# Patient Record
Sex: Female | Born: 1940
Health system: Southern US, Community
[De-identification: ages and names within clinical notes are randomized; demographics above are authoritative.]

## PROBLEM LIST (undated history)

## (undated) ENCOUNTER — Inpatient Hospital Stay (HOSPITAL_COMMUNITY): Payer: Medicare Other

## (undated) DIAGNOSIS — Z8673 Personal history of transient ischemic attack (TIA), and cerebral infarction without residual deficits: Secondary | ICD-10-CM

## (undated) DIAGNOSIS — I4891 Unspecified atrial fibrillation: Secondary | ICD-10-CM

## (undated) DIAGNOSIS — I10 Essential (primary) hypertension: Secondary | ICD-10-CM

## (undated) DIAGNOSIS — R7302 Impaired glucose tolerance (oral): Secondary | ICD-10-CM

## (undated) DIAGNOSIS — I639 Cerebral infarction, unspecified: Secondary | ICD-10-CM

## (undated) DIAGNOSIS — Z9289 Personal history of other medical treatment: Secondary | ICD-10-CM

## (undated) DIAGNOSIS — I2510 Atherosclerotic heart disease of native coronary artery without angina pectoris: Secondary | ICD-10-CM

## (undated) DIAGNOSIS — E119 Type 2 diabetes mellitus without complications: Secondary | ICD-10-CM

## (undated) DIAGNOSIS — E785 Hyperlipidemia, unspecified: Secondary | ICD-10-CM

## (undated) DIAGNOSIS — Z720 Tobacco use: Secondary | ICD-10-CM

## (undated) HISTORY — DX: Personal history of other medical treatment: Z92.89

## (undated) HISTORY — DX: Hyperlipidemia, unspecified: E78.5

## (undated) HISTORY — DX: Impaired glucose tolerance (oral): R73.02

## (undated) HISTORY — DX: Tobacco use: Z72.0

## (undated) HISTORY — DX: Unspecified atrial fibrillation: I48.91

## (undated) HISTORY — DX: Personal history of transient ischemic attack (TIA), and cerebral infarction without residual deficits: Z86.73

---

## 1999-01-13 ENCOUNTER — Encounter: Payer: Self-pay | Admitting: Emergency Medicine

## 1999-01-13 ENCOUNTER — Emergency Department (HOSPITAL_COMMUNITY): Admission: EM | Admit: 1999-01-13 | Discharge: 1999-01-13 | Payer: Self-pay | Admitting: Emergency Medicine

## 2010-08-21 DIAGNOSIS — I639 Cerebral infarction, unspecified: Secondary | ICD-10-CM

## 2010-08-21 HISTORY — DX: Cerebral infarction, unspecified: I63.9

## 2010-12-08 ENCOUNTER — Encounter (HOSPITAL_COMMUNITY): Payer: Self-pay | Admitting: Radiology

## 2010-12-08 ENCOUNTER — Inpatient Hospital Stay (HOSPITAL_COMMUNITY)
Admission: EM | Admit: 2010-12-08 | Discharge: 2010-12-14 | DRG: 065 | Disposition: A | Discharge status: Home Health | Payer: Medicare Other | Attending: Neurology | Admitting: Neurology

## 2010-12-08 ENCOUNTER — Emergency Department (HOSPITAL_COMMUNITY): Payer: Medicare Other

## 2010-12-08 DIAGNOSIS — Z7901 Long term (current) use of anticoagulants: Secondary | ICD-10-CM

## 2010-12-08 DIAGNOSIS — F17200 Nicotine dependence, unspecified, uncomplicated: Secondary | ICD-10-CM | POA: Diagnosis present

## 2010-12-08 DIAGNOSIS — I10 Essential (primary) hypertension: Secondary | ICD-10-CM | POA: Diagnosis present

## 2010-12-08 DIAGNOSIS — E785 Hyperlipidemia, unspecified: Secondary | ICD-10-CM | POA: Diagnosis present

## 2010-12-08 DIAGNOSIS — I4892 Unspecified atrial flutter: Secondary | ICD-10-CM | POA: Diagnosis present

## 2010-12-08 DIAGNOSIS — I4891 Unspecified atrial fibrillation: Secondary | ICD-10-CM | POA: Diagnosis present

## 2010-12-08 DIAGNOSIS — I6350 Cerebral infarction due to unspecified occlusion or stenosis of unspecified cerebral artery: Principal | ICD-10-CM | POA: Diagnosis present

## 2010-12-08 HISTORY — DX: Essential (primary) hypertension: I10

## 2010-12-08 LAB — CBC
HCT: 42.4 % (ref 36.0–46.0)
Hemoglobin: 14.9 g/dL (ref 12.0–15.0)
MCH: 30.2 pg (ref 26.0–34.0)
MCHC: 35.1 g/dL (ref 30.0–36.0)
MCV: 86 fL (ref 78.0–100.0)
Platelets: 262 10*3/uL (ref 150–400)
RBC: 4.93 MIL/uL (ref 3.87–5.11)
RDW: 12.5 % (ref 11.5–15.5)
WBC: 10 10*3/uL (ref 4.0–10.5)

## 2010-12-08 LAB — BASIC METABOLIC PANEL
Chloride: 106 mEq/L (ref 96–112)
GFR calc Af Amer: >60 mL/min (ref >60)
Potassium: 3.6 mEq/L (ref 3.5–5.1)
Sodium: 138 mEq/L (ref 135–145)

## 2010-12-08 LAB — DIFFERENTIAL
Basophils Absolute: 0 10*3/uL (ref 0.0–0.1)
Basophils Relative: 0 % (ref 0–1)
Eosinophils Absolute: 0.5 10*3/uL (ref 0.0–0.7)
Eosinophils Relative: 5 % (ref 0–5)
Lymphocytes Relative: 34 % (ref 12–46)
Lymphs Abs: 3.4 10*3/uL (ref 0.7–4.0)
Monocytes Absolute: 0.5 10*3/uL (ref 0.1–1.0)
Monocytes Relative: 5 % (ref 3–12)
Neutro Abs: 5.7 10*3/uL (ref 1.7–7.7)
Neutrophils Relative %: 57 % (ref 43–77)

## 2010-12-09 ENCOUNTER — Inpatient Hospital Stay (HOSPITAL_COMMUNITY): Payer: Medicare Other

## 2010-12-09 DIAGNOSIS — I359 Nonrheumatic aortic valve disorder, unspecified: Secondary | ICD-10-CM

## 2010-12-09 LAB — COMPREHENSIVE METABOLIC PANEL
CO2: 25 mEq/L (ref 19–32)
Calcium: 8.8 mg/dL (ref 8.4–10.5)
Creatinine, Ser: 0.58 mg/dL (ref 0.4–1.2)
GFR calc non Af Amer: >60 mL/min (ref >60)
Glucose, Bld: 105 mg/dL — ABNORMAL HIGH (ref 70–99)
Total Bilirubin: 0.8 mg/dL (ref 0.3–1.2)

## 2010-12-09 LAB — LIPID PANEL
Cholesterol: 263 mg/dL — ABNORMAL HIGH (ref 0–200)
HDL: 35 mg/dL — ABNORMAL LOW (ref >39)
Triglycerides: 166 mg/dL — ABNORMAL HIGH (ref <150)

## 2010-12-09 LAB — CARDIAC PANEL(CRET KIN+CKTOT+MB+TROPI)
Relative Index: INVALID (ref 0.0–2.5)
Troponin I: 0.01 ng/mL (ref 0.00–0.06)

## 2010-12-09 LAB — APTT: aPTT: 32 seconds (ref 24–37)

## 2010-12-09 LAB — PROTIME-INR
INR: 0.91 (ref 0.00–1.49)
Prothrombin Time: 12.5 seconds (ref 11.6–15.2)

## 2010-12-09 LAB — HEMOGLOBIN A1C: Mean Plasma Glucose: 131 mg/dL — ABNORMAL HIGH (ref <117)

## 2010-12-09 MED ORDER — GADOBENATE DIMEGLUMINE 529 MG/ML IV SOLN
10.0000 mL | Freq: Once | INTRAVENOUS | Status: AC
  Administered 2010-12-09: 10 mL via INTRAVENOUS

## 2010-12-11 LAB — BASIC METABOLIC PANEL
BUN: 11 mg/dL (ref 6–23)
Calcium: 8.9 mg/dL (ref 8.4–10.5)
GFR calc non Af Amer: >60 mL/min (ref >60)
Potassium: 3.4 mEq/L — ABNORMAL LOW (ref 3.5–5.1)

## 2010-12-11 LAB — CK TOTAL AND CKMB (NOT AT ARMC): CK, MB: 0.8 ng/mL (ref 0.3–4.0)

## 2010-12-11 LAB — CARDIAC PANEL(CRET KIN+CKTOT+MB+TROPI)
CK, MB: 0.6 ng/mL (ref 0.3–4.0)
Relative Index: INVALID (ref 0.0–2.5)
Total CK: 59 U/L (ref 7–177)

## 2010-12-11 LAB — MAGNESIUM: Magnesium: 2.2 mg/dL (ref 1.5–2.5)

## 2010-12-12 DIAGNOSIS — I6330 Cerebral infarction due to thrombosis of unspecified cerebral artery: Secondary | ICD-10-CM

## 2010-12-12 DIAGNOSIS — I4891 Unspecified atrial fibrillation: Secondary | ICD-10-CM

## 2010-12-12 LAB — TROPONIN I: Troponin I: 0.01 ng/mL (ref 0.00–0.06)

## 2010-12-12 LAB — TSH: TSH: 1.417 u[IU]/mL (ref 0.350–4.500)

## 2010-12-12 LAB — CK TOTAL AND CKMB (NOT AT ARMC)
CK, MB: 0.8 ng/mL (ref 0.3–4.0)
Total CK: 43 U/L (ref 7–177)

## 2010-12-13 LAB — PROTIME-INR
INR: 0.94 (ref 0.00–1.49)
Prothrombin Time: 12.8 seconds (ref 11.6–15.2)

## 2010-12-16 ENCOUNTER — Ambulatory Visit (INDEPENDENT_AMBULATORY_CARE_PROVIDER_SITE_OTHER): Payer: Self-pay | Admitting: Internal Medicine

## 2010-12-16 ENCOUNTER — Encounter: Payer: Medicare Other | Admitting: *Deleted

## 2010-12-16 DIAGNOSIS — R0989 Other specified symptoms and signs involving the circulatory and respiratory systems: Secondary | ICD-10-CM

## 2010-12-16 DIAGNOSIS — I4891 Unspecified atrial fibrillation: Secondary | ICD-10-CM | POA: Insufficient documentation

## 2010-12-16 DIAGNOSIS — I6350 Cerebral infarction due to unspecified occlusion or stenosis of unspecified cerebral artery: Secondary | ICD-10-CM

## 2010-12-16 DIAGNOSIS — I639 Cerebral infarction, unspecified: Secondary | ICD-10-CM

## 2010-12-16 LAB — POCT INR: INR: 3.3

## 2010-12-19 ENCOUNTER — Ambulatory Visit: Payer: Self-pay | Admitting: Cardiology

## 2010-12-19 LAB — POCT INR: INR: 2.5

## 2010-12-20 NOTE — Consult Note (Signed)
Linda [name], Linda [name] NO.:  [id number]  MEDICAL RECORD NO.:  [id number]           PATIENT TYPE:  LOCATION:                                 FACILITY:  PHYSICIAN:  Bevelyn Buckles. Bensimhon, MDDATE OF BIRTH:  [birth year]  DATE OF CONSULTATION: DATE OF DISCHARGE:                                CONSULTATION   The patient is new to Cardiology.  PRIMARY CARE PHYSICIAN:  Not established  REASON FOR CONSULTATION:  Atrial fibrillation/flutter with RVR.  HISTORY OF PRESENT ILLNESS:  Linda [name] is a 70 year old [place] female, non-English speaking, with no known cardiac history but medical history significant for hypertension, hyperlipidemia, ongoing tobacco abuse (3-6 cigarettes per day, 25 pack years), who presents with new- onset atrial fibrillation/flutter in the setting of admission for CVA.  The patient was in her usual state of health until she noted right-sided weakness on December 08, 2010, and presented to [facility] for further evaluation.  The patient had MRI of the head that showed acute nonhemorrhagic left paracentral pontine infarct and was admitted by the neurology team.  The patient subsequently found to go into atrial fibrillation/flutter with RVR and was rate controlled on diltiazem.  She spontaneously converted and has been maintaining normal sinus rhythm ever since.  The patient actually has had some sinus bradycardia with heart rates documented down in the 30s, although on telemetry I only see down into the 50s.  There is no evidence of significant pauses, but she has been unable to receive her p.o. Cardizem except for the very first dose when her IV diltiazem was discontinued.  She has been mildly hypertensive with peak systolic blood pressure at 159; however, initial blood pressures when she arrived were markedly elevated with peak systolics in 190s, peak diastolic 100.  The patient has had a 2-D echocardiogram that shows a normal LVEF with mild  LVH, grade 1 diastolic function, and mild AI.  Chest x-ray shows only mild cardiomegaly.  EKG with some anterolateral T-wave inversions during her AFib with RPR but resolution of these changes with her conversion.  Labs unremarkable. Currently, the patient has no complaints.  The patient denies chest pain, shortness of breath, orthopnea, PND, lower extremity edema, history of bleeding or dark stools, any difficulty with medical noncompliance, cough, fevers, chills, any other recent changes other than her right-sided weakness that lead to her initial evaluation.  PAST MEDICAL HISTORY: 1. Hypertension. 2. Hyperlipidemia. 3. Ongoing tobacco abuse (3-6 cigarettes per day, 25 pack-year     history). 4. History of CVA (new diagnosis on December 08, 2010).  SOCIAL HISTORY:  The patient lives in [place] with her daughters.  She is not working.  She has the above-noted tobacco abuse history.  No significant EtOH or illicit drug use.  Regular diet.  No regular exercise but active and independent with all daily activities.  FAMILY HISTORY:  Negative for premature diagnosis of coronary artery disease in any first-degree relatives.  REVIEW OF SYSTEMS:  Please see HPI.  All other systems reviewed and were negative.  CODE STATUS:  Full.  ALLERGIES:  NKDA.  MEDICATIONS (  ALL NEW/NO HOME MEDS): 1. Norvasc 5 mg p.o. daily. 2. ECASA 325 mg p.o. daily. 3. Diltiazem 30 mg p.o. q.6 h. (only received one dose). 4. Lovenox 40 mg subcu injection q.24 h. 5. Simvastatin 20 mg p.o. at bedtime. 6. P.r.n. medications.  PHYSICAL EXAMINATION:  VITAL SIGNS:  Temperature 98.9 degrees Fahrenheit with BP 119-159 over 60-84, pulse 63-83 with respiration rate equal to 12, O2 saturation 100% on room air. GENERAL:  The patient is alert and oriented x3 in no apparent distress, able to speak easily and move easily without respiratory distress. HEENT:  Her head is normocephalic, atraumatic.  Pupils equal,  round, reactive to light.  Extraocular muscles are intact.  Nares are patent without discharge. NECK:  Supple without lymphadenopathy.  No thyromegaly.  No JVD. HEART:  Rate is regular, bradycardic in the 50s with audible S1 and S2. A 2/6 systolic murmur in the left upper sternal border.  Pulses 2+ and equal in upper extremities, bilaterally absent in lower extremities, but cap refill less than 2 seconds in lower extremities bilaterally. LUNGS:  Clear to auscultation bilaterally. SKIN:  No rashes, lesions, or petechiae. ABDOMEN:  Soft, nontender, nondistended.  Normal abdominal bowel sounds. No rebound or guarding.  No hepatosplenomegaly. EXTREMITIES:  Without clubbing, cyanosis, or edema. MUSCULOSKELETAL:  Without joint deformity or effusions.  No spinal or CVA tenderness. NEUROLOGIC:  Cranial nerves II through XII grossly intact.  Strength 5/5 in all extremities and axillary groups.  Normal sensation throughout and normal cerebellar function.  RADIOLOGY: 1. Chest x-ray showed mild cardiomegaly. 2. MRI of the head showed acute nonhemorrhagic left paracentral     pontine infarction.  EKG:  Atrial flutter/course of fib at a rate of 110 bpm with anterolateral T-wave inversion, normal axis, no significant Q-waves, LVH.  QRS 84, QTc 487.  Followup tracing showed sinus bradycardia at 57 bpm with no significant ST changes, QTc 483, normal axis, no less pronounced LVH.  LABORATORY DATA:  CBC within normal limits.  BMET, sodium 138, potassium 3.4, chloride 106, bicarb 23, BUN 11, creatinine 0.55, glucose 133. Liver function tests within normal limits.  Hemoglobin A1c 6.2%. Cardiac enzymes negative x4.  ASSESSMENT AND PLAN:  The patient initially seen by Jarrett Ables, PA- C, and then seen and thoroughly assessed by attending cardiologist, Dr. Arvilla Meres.  Linda [name] is a 70 year old [place] female who has no cardiac history but history significant for hypertension,  hyperlipidemia, ongoing tobacco abuse, and now presents with new diagnosis of atrial fibrillation, spontaneously converted on rate control only in the setting of admission for an acute nonhemorrhagic left paracentral pontine infarction.  PAF - now in normal sinus rhythm/sinus bradycardia.  We will need anticoagulation.  Given her lack of insurance, we would favor Coumadin over Pradaxa.  Family feels that she can do Coumadin at the [facility] office.  Heart rate is too low for beta blockade therapy.  We would recommend 2-week event monitor off rate control to assess frequency/severity of atrial fibrillation.     Jarrett Ables, PAC   ______________________________ Bevelyn Buckles. Bensimhon, MD    MS/MEDQ  D:  12/12/2010  T:  12/13/2010  Job:  [id number]  Electronically Signed by Jarrett Ables PAC on 12/16/2010 02:36:58 PM Electronically Signed by Arvilla Meres MD on 12/20/2010 07:19:01 PM

## 2010-12-21 NOTE — Consult Note (Signed)
Linda [name], Linda [name] NO.:  [id number]  MEDICAL RECORD NO.:  [id number]           PATIENT TYPE:  I  LOCATION:  3017                         FACILITY:  [facility]  PHYSICIAN:  Brendia Sacks, MD    DATE OF BIRTH:  1941/05/16  DATE OF CONSULTATION:  12/11/2010 DATE OF DISCHARGE:                                CONSULTATION   REQUESTING PHYSICIAN:  Levie Heritage, MD  REASON FOR CONSULTATION:  Atrial fibrillation.  HISTORY OF PRESENT ILLNESS:  This is a 70 year old woman, who was admitted on December 08, 2010, with right-sided weakness by the Neurology Service and was found to have had acute stroke.  She has been followed on the Stroke Service and there are plans for a TEE tomorrow, December 12, 2010, and plans for discharge to rehab soon.  The patient's rhythm has been sinus bradycardia up until today when she developed sudden atrial fibrillation and flutter with rapid ventricular response.  Consultation was requested for treatment and recommendations.  The patient reports she has a history of palpitations and apparently has a 24-hour monitor in the past, which she was told was normal.  She has no known previous diagnosis of cardiac arrhythmias or heart problems in general.  Interview was conducted with the aid of the telephone translator.  REVIEW OF SYSTEMS:  The patient denies any chest pain or shortness of breath.  She does complain of her stroke symptoms.  She has no other complaints on review of systems.  It is not clear whether she might have reported chest pain to her nurse earlier.  Certainly she has none now.  PAST MEDICAL HISTORY: 1. Hypertension. 2. Hyperlipidemia. 3. New diagnosis of stroke this admission.  PAST SURGICAL HISTORY:  She describes trouble with delivery 40 years ago, at which point she had have surgery and could not have children afterwards presumably emergent hysterectomy.  SOCIAL HISTORY:  She smokes 3-4 cigarettes per day and is  widowed.  FAMILY HISTORY:  Mother died of old age and father died of unknown illness.  Brother had alcoholism.  INPATIENT MEDICATIONS: 1. Xanax 0.25 mg x1. 2. Norvasc 5 mg p.o. daily. 3. Aspirin 325 mg p.o. daily. 4. She did receive 1 p.o. dose of Cardizem. 5. Lovenox 40 mg subcu daily. 6. Zocor 20 mg p.o. at bedtime. 7. Normal saline at 75 mL per hour.  PHYSICAL EXAMINATION:  VITAL SIGNS:  This morning, temperature 98.8, pulse 56, respirations 18, blood pressure of 161/73, and sat 97% on room air.  During my examination, her heart rate ranges from the 90s to the 140s.  On telemetry, she appears to have atrial flutter with variable block. GENERAL:  The patient is calm and comfortable lying in bed and appears to be in no acute distress. HEENT:  Head appears to be normal.  Eyes, sclerae clear.  Pupils are equal, round, reactive to light with irises and conjunctivae appear unremarkable.  ENT, she phonates well although she does not speak [place].  Her lips and tongue appear unremarkable. NECK:  Supple.  No lymphadenopathy or masses.  No thyromegaly.CHEST:  Clear to auscultation bilaterally.  No wheezes, rales, or rhonchi.  There is normal respiratory effort. CARDIOVASCULAR:  Regular rhythm, tachycardic.  No murmur, rub, or gallop.  No lower extremity edema. ABDOMEN:  Soft, nontender, nondistended.  No masses are appreciated. SKIN:  Appears to be grossly unremarkable. MUSCULOSKELETAL:  Strength in the left upper and left lower extremity are 5/5.  Strength in the right upper and right lower extremity is 3+/5. She has an obvious right facial droop.  ANCILLARY STUDIES:  EKG independently reviewed showed atrial fibrillation and atrial flutter with variable block with rapid ventricular response.  Lateral T-wave inversion and considered ischemia in the EKG at 12:49 today.  Repeat EKG at 14:37 showed atrial fibrillation with rapid ventricular response and atrial flutter with variable  block.  An EKG at 14:45, atrial fibrillation with rapid ventricular response, T-wave inversion laterally has resolved.  Previous EKG shows sinus bradycardia.  LABORATORY STUDIES:  One set of cardiac enzymes today negative.  No other laboratory studies.  Previous laboratory studies from December 09, 2010, showed unremarkable complete metabolic panel and CBC on admission was normal.  IMAGING:  Review of imaging is notable for an acute nonhemorrhagic left paracentral pontine infarct.  IMPRESSION AND RECOMMENDATIONS: 1. Atrial fibrillation and flutter with rapid ventricular response     with a normal ejection fraction.  Her 2-D echocardiogram done this     admission showed an ejection fraction of 60-65% with grade 1     diastolic dysfunction.  She has not responded to oral Cardizem     here.  Her rate is quite high at times.  We will transfer her to     3700 and place her on IV Cardizem infusion.  She will continue on     prophylactic Lovenox dosing.  We will check a TSH.  Consider     Coumadin.  I have discussed the case with Dr. Hoy Morn.  He     recommends reevaluation by the Stroke Service in the morning and     consideration of Coumadin and this patient cannot be fully     anticoagulated at this point. 2. Hypertension.  This appears to be stable. 3. Left pontine stroke.  Defer to the stroke team.  This plan was discussed with Dr. Hoy Morn.     Brendia Sacks, MD    DG/MEDQ  D:  12/11/2010  T:  12/11/2010  Job:  [id number]  Electronically Signed by Brendia Sacks  on 12/21/2010 09:58:33 PM

## 2010-12-24 NOTE — Discharge Summary (Signed)
Linda [name], Linda [name]                   ACCOUNT NO.:  [id number]  MEDICAL RECORD NO.:  [id number]           PATIENT TYPE:  I  LOCATION:  3701                         FACILITY:  [facility]  PHYSICIAN:  Giuseppina Quinones P. Pearlean Brownie, MD    DATE OF BIRTH:  03-24-1941  DATE OF ADMISSION:  12/08/2010 DATE OF DISCHARGE:  12/14/2010                              DISCHARGE SUMMARY   DIAGNOSES AT THE TIME OF DISCHARGE: 1. Right pontine infarct secondary to small-vessel disease. 2. Paroxysmal atrial fibrillation converted to normal sinus rhythm     during hospitalization, new diagnosis. 3. Hypertension. 4. Hyperlipidemia. 5. Cigarette smoker (3-6 cigarettes per day with a 25-pack year     history).  MEDICINES AT THE TIME OF DISCHARGE: 1. Amlodipine 5 mg a day. 2. Lipitor 10 mg a day. 3. Warfarin 5 mg tablets, start 7.5 mg daily until directed by [facility] to adjust dose.  STUDIES PERFORMED: 1. CT of the brain on admission shows age indeterminate, but possibly     acute or subacute nonhemorrhagic stroke involving the right     occipital lobe.  Mild bilateral carotid siphon atherosclerosis. 2. Chest x-ray shows mild cardiomegaly.  No acute cardiopulmonary     disease. 3. MRI of the brain shows acute nonhemorrhagic left paracentral     pontine infarct.  Remote infarcts and small vessel disease-type     changes. 4. MRA of the head shows prominent intracranial atherosclerotic-type     changes. 5. MRA of the neck shows motion-degraded exam with atherosclerotic-     type changes. 6. A 2-D echocardiogram shows EF of 60-65% with no obvious source of     embolus. 7. Carotid Doppler shows mild homogeneous plaque noted in distal     common carotid artery and origin and proximal ICA.  No significant     ICA stenosis.  Vertebral artery flow is antegrade. 8. Initial EKG shows sinus rhythm.  EKG on December 11, 2010, at 12:49     shows atrial fibrillation with rapid ventricular response.  LABORATORY  STUDIES:  INR 1.19, PT 15.3.  TSH 1.417.  Troponin 0.01, CK 43, CK-MB 0.8.  Chemistry with potassium 3.4, glucose 133, otherwise normal.  Hemoglobin A1c 6.2.  Cholesterol 263, triglycerides 166, HDL 35, and LDL 195.  HISTORY OF PRESENT ILLNESS:  Linda [name] is a 70 year old [place] female with a history of hypertension that was untreated and dyslipidemia that was untreated.  The patient presents to [facility] Emergency Room after she awakened with a right-sided hemiparesis.  The patient was last seen normal on the evening of December 07, 2010.  The patient had noted 2-day history of intermittent visual complaints and headaches.  The patient awakened the morning of admission at 5:00 a.m. with right-sided weakness and numbness and some slurred speech. Throughout the day, the speech has improved.  The patient has been noted on CT to have a right occipital infarct that is subacute.  No acute changes were seen in the left hemisphere.  Neurology was asked to see for further evaluation.  Blood pressures were in  the 180s over 70s range.  The patient was on no medicines prior to coming in.  She was not a t-PA candidate secondary to delay in time of arrival.  HOSPITAL COURSE:  MRI revealed an acute left pontine infarct, which was small and felt to be secondary to small-vessel disease.  The patient had hypertension and hyperlipidemia that were treated respectively with Norvasc and Lipitor.  She was initially started on aspirin as her antiplatelet.  In the hospital, the patient converted to atrial fibrillation with rapid ventricular response.  [facility] were consulted initially who suggested Coumadin.  Cardiology was also consulted who agreed with Coumadin as well as initial recommendation to change Norvasc to lisinopril.  Cardiology then decided to change diltiazem back to Norvasc to maintain blood pressure control.  Coumadin was started.  Aspirin was discontinued and plans are for  outpatient event monitor by Cardiology.  In hospital, the patient was evaluated by PT, OT, and Speech Therapy to evaluate for possible rehab needs.  At the time of discharge, it is felt the patient is too high-level for inpatient rehab.  Family is agreeable to provide 24/7 supervision.  I have put in place Home Health, PT, OT, and nurse in order to support them along with rolling walker.  CONDITION AT THE TIME OF DISCHARGE:  The patient is alert and oriented x3.  Speech normal, follows commands.  Language normal.  She has no drift in her upper extremities.  She has mild right face and hand weakness.  DISCHARGE PLAN: 1. Discharge home with family with 24/7 supervision. 2. Home Health, PT, OT, and nurse. 3. Coumadin for secondary stroke prevention. 4. Follow up with Dr. Gala [name] in 4 weeks. 5. Outpatient cardiac event monitor as recommended by Dr. Gala [name].     [facility] contacted. 6. Follow up with Linda [name] in his stroke followup clinic in 1-2     months.     Linda [name], N.P.   ______________________________ Linda [name]. Pearlean Brownie, MD    SB/MEDQ  D:  12/14/2010  T:  12/15/2010  Job:  [id number]  cc:   Linda Erion P. Pearlean Brownie, MD Linda Farr, MD Linda Buckles. Bensimhon, MD [facility]  Electronically Signed by Linda [name] N.P. on 12/22/2010 08:54:54 AM Electronically Signed by Delia Heady MD on 12/24/2010 01:49:47 PM

## 2010-12-26 ENCOUNTER — Ambulatory Visit (INDEPENDENT_AMBULATORY_CARE_PROVIDER_SITE_OTHER): Payer: Self-pay | Admitting: Internal Medicine

## 2010-12-26 DIAGNOSIS — R0989 Other specified symptoms and signs involving the circulatory and respiratory systems: Secondary | ICD-10-CM

## 2011-01-02 ENCOUNTER — Encounter: Payer: Medicare Other | Admitting: *Deleted

## 2011-01-03 NOTE — H&P (Signed)
NAMEMAKENLEIGH, CROWNOVER NO.:  [id number]  MEDICAL RECORD NO.:  [id number]           PATIENT TYPE:  I  LOCATION:  3017                         FACILITY:  [facility]  PHYSICIAN:  Marlan Palau, M.D.  DATE OF BIRTH:  [birth year]  DATE OF ADMISSION:  12/08/2010 DATE OF DISCHARGE:                             HISTORY & PHYSICAL   HISTORY OF PRESENT ILLNESS:  Linda [name] is a 70 year old [place] female born April 28, 1941 with a history of hypertension that was untreated and dyslipidemia that was untreated.  This patient comes to the emergency room this morning after she awakened with a right-sided deficit.  The patient was last seen normal on the evening of December 07, 2010.  The patient had noted 2-day history of intermittent visual complaints and headache.  The patient awakened this morning around 5 a.m. with right-sided weakness and numbness and some slurred speech. Throughout the day, the speech has improved.  The patient has been noted on CT scan of the brain to have a right occipital infarct that is subacute.  No acute changes were seen in the left hemisphere.  Neurology was asked to see this patient for further evaluation.  Blood pressures are in the 180/70 range.  The patient was on no medications prior to coming in.  Past medical history is significant for; 1. New onset of what is probably her bihemispheric strokes. 2. Hypertension, untreated. 3. Dyslipidemia that has been untreated.  The patient has no surgical history.  Was on no medications prior to coming in.  The patient has no known allergies.  Smokes 3-4 cigarettes daily.  Does not drink alcohol.  SOCIAL HISTORY:  The patient is widowed, lives with her daughter in the [place] area.  Does not work.  Has only one child.  FAMILY MEDICAL HISTORY:  That mother died of "old age."  Father died of unknown etiology.  The patient has one brother who died with complications of alcoholism.   No sisters.  REVIEW OF SYSTEMS:  It is notable for headache for several days.  The patient has occasional chest pains, increased heart rate, no shortness of breath, nausea, vomiting, troubles controlling the bowels or bladder. No blackout episodes.  PHYSICAL EXAMINATION:  VITAL SIGNS:  Blood pressure is 180/71, heart rate 60, respiratory rate 18, temperature afebrile. GENERAL:  This patient is a fairly well-developed [place] female who is alert and cooperative at time of the examination.  HEENT:  Head is atraumatic.  Eyes; pupils are equal, round, and reactive to light. NECK:  Supple.  No carotid bruits noted. RESPIRATORY:  Clear. CARDIOVASCULAR:  Reveals a regular rate and rhythm.  No obvious murmurs or rubs noted. EXTREMITIES:  Without significant edema. ABDOMEN:  Positive bowel sounds.  No organomegaly or tenderness noted. NEUROLOGIC:  Cranial nerves as above.  Facial symmetry is present.  The patient has good sensation of the face to pinprick, soft touch bilaterally.  She has good strength of the facial muscles.  The patient has full extraocular movements.  Visual fields appear to be full.  Motor testing reveals low slight weakness  of the right arm and right leg greater than the left.  The patient has drift with the right arm, right leg; not present on the left.  The patient has good finger-nose-finger and heel-to-shin bilaterally.  No ataxia noted.  Gait was not tested. Deep tendon reflexes symmetric.  Normal toes, were neutral bilaterally. The patient notes decreased pinprick sensation on the right arm and right leg greater than left.  Vibratory sensation is depressed on the right arm and right leg greater than left.  The patient has no evidence of extinction with double simultaneous stimulation.  No definite aphasia was noted.  Dysarthria has normalized.  Laboratory values notable for a sodium of 138, potassium of 3.6.  The patient has a chloride of 106, CO2 of 23, glucose  of 114, BUN of 10, creatinine of 0.58, calcium 9.0, white count of 10.0, hemoglobin of 14.9, hematocrit 42.4, MCV of 86.0, platelets of 286.  CT of the head is as above.  IMPRESSION: 1. Probable bihemispheric strokes, likely cardiogenic. 2. Hypertension, untreated. 3. Dyslipidemia, untreated.  The patient will be admitted to [facility] for further stroke evaluation.  The patient will go on aspirin therapy, undergo an MRI of the brain and MRA of the head and neck.  The patient will undergo a 2-D echocardiogram and be seen by physical and occupational therapy.  We will follow patient's clinical course while in-house.  The patient will go on medications for blood pressure at this time.  The patient does not have a primary care physician, but has gone to [facility] in the past.     C. Lesia Sago, M.D.     CKW/MEDQ  D:  12/08/2010  T:  12/09/2010  Job:  [id number]  cc:   [facility]  Electronically Signed by Thana Farr M.D. on 01/03/2011 08:16:51 AM

## 2011-01-17 ENCOUNTER — Ambulatory Visit (INDEPENDENT_AMBULATORY_CARE_PROVIDER_SITE_OTHER): Payer: Medicare Other | Admitting: *Deleted

## 2011-01-17 DIAGNOSIS — I4891 Unspecified atrial fibrillation: Secondary | ICD-10-CM

## 2011-01-17 LAB — POCT INR: INR: 2.8

## 2011-02-13 ENCOUNTER — Ambulatory Visit (INDEPENDENT_AMBULATORY_CARE_PROVIDER_SITE_OTHER): Payer: Medicare Other | Admitting: *Deleted

## 2011-02-13 DIAGNOSIS — I4891 Unspecified atrial fibrillation: Secondary | ICD-10-CM

## 2011-02-13 DIAGNOSIS — I639 Cerebral infarction, unspecified: Secondary | ICD-10-CM

## 2011-02-13 DIAGNOSIS — I6350 Cerebral infarction due to unspecified occlusion or stenosis of unspecified cerebral artery: Secondary | ICD-10-CM

## 2011-03-13 ENCOUNTER — Ambulatory Visit (INDEPENDENT_AMBULATORY_CARE_PROVIDER_SITE_OTHER): Payer: Medicare Other | Admitting: *Deleted

## 2011-03-13 DIAGNOSIS — I639 Cerebral infarction, unspecified: Secondary | ICD-10-CM

## 2011-03-13 DIAGNOSIS — I4891 Unspecified atrial fibrillation: Secondary | ICD-10-CM

## 2011-03-13 DIAGNOSIS — I6350 Cerebral infarction due to unspecified occlusion or stenosis of unspecified cerebral artery: Secondary | ICD-10-CM

## 2011-03-13 MED ORDER — AMLODIPINE BESYLATE 5 MG PO TABS: 5.0000 mg | ORAL_TABLET | Freq: Every day | ORAL | Status: DC

## 2011-03-13 MED ORDER — ATORVASTATIN CALCIUM 10 MG PO TABS: 10.0000 mg | ORAL_TABLET | Freq: Every day | ORAL | Status: DC

## 2011-03-24 ENCOUNTER — Encounter: Payer: Self-pay | Admitting: Physician Assistant

## 2011-03-27 ENCOUNTER — Other Ambulatory Visit: Payer: Self-pay | Admitting: Pharmacist

## 2011-03-27 ENCOUNTER — Ambulatory Visit (INDEPENDENT_AMBULATORY_CARE_PROVIDER_SITE_OTHER): Payer: Medicare Other | Admitting: Physician Assistant

## 2011-03-27 ENCOUNTER — Encounter: Payer: Self-pay | Admitting: Physician Assistant

## 2011-03-27 VITALS — BP 157/83 | HR 67 | Resp 16 | Wt 122.0 lb

## 2011-03-27 DIAGNOSIS — M79609 Pain in unspecified limb: Secondary | ICD-10-CM

## 2011-03-27 DIAGNOSIS — E785 Hyperlipidemia, unspecified: Secondary | ICD-10-CM | POA: Insufficient documentation

## 2011-03-27 DIAGNOSIS — I4891 Unspecified atrial fibrillation: Secondary | ICD-10-CM

## 2011-03-27 DIAGNOSIS — R1084 Generalized abdominal pain: Secondary | ICD-10-CM | POA: Insufficient documentation

## 2011-03-27 DIAGNOSIS — I10 Essential (primary) hypertension: Secondary | ICD-10-CM | POA: Insufficient documentation

## 2011-03-27 DIAGNOSIS — R079 Chest pain, unspecified: Secondary | ICD-10-CM | POA: Insufficient documentation

## 2011-03-27 DIAGNOSIS — R21 Rash and other nonspecific skin eruption: Secondary | ICD-10-CM | POA: Insufficient documentation

## 2011-03-27 DIAGNOSIS — M79606 Pain in leg, unspecified: Secondary | ICD-10-CM | POA: Insufficient documentation

## 2011-03-27 MED ORDER — AMLODIPINE BESYLATE 10 MG PO TABS: 10.0000 mg | ORAL_TABLET | Freq: Every day | ORAL | Status: DC

## 2011-03-27 MED ORDER — WARFARIN SODIUM 5 MG PO TABS: 5.0000 mg | ORAL_TABLET | Freq: Every day | ORAL | Status: DC

## 2011-03-27 NOTE — Assessment & Plan Note (Signed)
She is maintaining normal sinus rhythm.  She has been following up with the Coumadin clinic in our office.  She continues to have occasional tachypalpitations.  I have recommended proceeding with the original plan of obtaining an event monitor to assess for frequency of atrial fibrillation.  She had difficulty taking A-V nodal blocking agents in the hospital due to bradycardia.  I will also set her up for a stress test to rule out the possibility of ischemic heart disease.  If this is negative and she is having significant amounts of atrial fibrillation, we could consider antiarrhythmic therapy in the form of a class IC agent.  I will bring her back in followup with Dr. Gala Romney in the next one to 2 months.

## 2011-03-27 NOTE — Assessment & Plan Note (Signed)
She is not really describing any abdominal pain.  She denies any symptoms consistent with acid reflux.  Her abdominal exam is fairly normal.  As noted, she will be referred to primary care.  She can follow further with her new PCP.

## 2011-03-27 NOTE — Patient Instructions (Addendum)
For your hands: Apply Hydrocortisone 1% cream twice daily for one week. Apply Eucerin or Lubriderm cream to your hands at bedtime every night. We will refer you to a primary care doctor.  If this does not get better on your hands, follow up with your primary care doctor for further evaluation.  Lab today--CK/Liver profile  427.31  786.50   Increase Norvasc (amlodipine) to 10mg  daily.  Do not take Lipitor for 2 weeks. If your leg pain does not get any better after not taking Lipitor for 2 weeks start taking Lipitor again. If your leg pain gets better after not taking Lipitor for 2 weeks call our office and let us know.  Schedule an appointment for an event monitor.  Schedule an appointment for a stress myoview test. See instruction sheet given to you today.  Schedule an appointment with a primary care doctor.  Schedule an appointment with Dr Gala Romney in 4-6 weeks.

## 2011-03-27 NOTE — Assessment & Plan Note (Signed)
Obtain LFTs today.  Hold her Lipitor as noted.

## 2011-03-27 NOTE — Assessment & Plan Note (Signed)
Uncontrolled.  Increase Norvasc from 5-10 mg a day.

## 2011-03-27 NOTE — Assessment & Plan Note (Signed)
Question if she is describing myalgias from her Lipitor.  I will have her hold her Lipitor for 2 weeks to see if this helps.  I will also obtain a CPK level today.  If her leg pain does not improve, she can continue Lipitor.  If it does improve, I would consider switching to a medication like Crestor or pravastatin.

## 2011-03-27 NOTE — Assessment & Plan Note (Signed)
Atypical.  However, as noted above, proceed with a stress test to rule out the possibility of ischemic heart disease in case antiarrhythmic therapy is needed.

## 2011-03-27 NOTE — Progress Notes (Signed)
History of Present Illness: Primary Cardiologist:  Dr. Arvilla Meres  Linda [name] is a 70 y.o. [place] female with a h/o HTN, hyperlipidemia and tobacco abuse who was diagnosed with paroxysmal atrial fibrillation/flutter during a hospitalization in 4/12 for a left pontine stroke.  She had no significant ICA stenosis by carotid dopplers.  Echo demonstrated EF 60-65%, mild LVH, grade 1 diast dysfxn, mild AI.  She was seen in the hospital by Dr. Gala Romney and the plan was to get an outpatient event monitor and follow up here.  But, this is her first visit with [name] since her hospitalization.  She has been followed in the coumadin clinic in this office.    She does not speak [place].  She is here today with her granddaughter and an interpreter who both help with the history.  She has a few different complaints today.  She continues to note tachypalpitations several times a week.  She also states her stomach "feels hard."  She denies pain.  No nausea, vomiting, diarrhea, dysphagia or odynophagia.  No unintentional weight loss.  No fevers or chills.  No melena or hematochezia.  No association with meals.  She also notes leg pain.  It is fairly constant and worse in the right leg than the left.  She points to her knees.  She thinks it started when she started the Lipitor.  No edema.  She notes occasion chest pain.  She describes it as feeling like something "biting."  She has noted this for years.  Does not feel like it is changing.  If she drinks something it helps.  She denies orthopnea, PND, syncope.  She also notes a rash on her hands.  No itching.  No other rash on her body.  She thinks she needs a refill on her coumadin.  Past Medical History  Diagnosis Date  . Hypertension   . Hyperlipidemia   . Tobacco abuse     Ongoing (3-6 cigarettes per day, 25 pack year history)  . History of CVA (cerebrovascular accident)     New diagnosis December 08, 2010  . Atrial fibrillation     dx in setting of stroke  4/12;  echo with EF 60-65%, mild LVH, mild AI, grade 1 diast dysfxn    Current Outpatient Prescriptions  Medication Sig Dispense Refill  . Warfarin Sodium (COUMADIN PO) Take by mouth as directed.        Marland Kitchen DISCONTD: amLODipine (NORVASC) 5 MG tablet Take 1 tablet (5 mg total) by mouth daily.  30 tablet  1  . amLODipine (NORVASC) 10 MG tablet Take 1 tablet (10 mg total) by mouth daily.  30 tablet  6    Allergies: No Known Allergies  Social history:  She continues to smoke cigarettes  ROS:  Please see the history of present illness.  All other systems reviewed and negative.   Vital Signs: BP 157/83  Pulse 67  Resp 16  Wt 122 lb (55.339 kg)  PHYSICAL EXAM: Well nourished, well developed, in no acute distress HEENT: normal Neck: no JVD Vascular: No carotid bruits; DP/PT 2+ bilaterally Endocrine: No thyromegaly Cardiac:  normal S1, S2; RRR; no murmur Lungs:  clear to auscultation bilaterally, no wheezing, rhonchi or rales Abd: soft, nontender, no hepatomegaly, normal bowel sounds Ext: no edema Musculoskeletal: No obvious deformities Skin: warm and dry; fingertips of bilateral hands with diffuse scaling and drying with associated pitting, nails without pits Neuro:  CNs 2-12 intact, no focal abnormalities noted  EKG:  Sinus  rhythm, heart rate 65, normal axis, no ischemic changes  ASSESSMENT AND PLAN:

## 2011-03-27 NOTE — Assessment & Plan Note (Signed)
I suspect she has dyshidrotic eczema on her hands.  I have recommended over-the-counter hydrocortisone cream for a week and daily moisturizers.  I also recommend she get a primary care physician.  We will refer her today.  If her symptoms do not improve on her hands, she will need to followup with her new PCP.

## 2011-03-28 LAB — HEPATIC FUNCTION PANEL
ALT: 26 U/L (ref 0–35)
Albumin: 4 g/dL (ref 3.5–5.2)
Total Protein: 7.4 g/dL (ref 6.0–8.3)

## 2011-04-10 ENCOUNTER — Encounter (INDEPENDENT_AMBULATORY_CARE_PROVIDER_SITE_OTHER): Payer: Medicare Other

## 2011-04-10 ENCOUNTER — Ambulatory Visit (INDEPENDENT_AMBULATORY_CARE_PROVIDER_SITE_OTHER): Payer: Medicare Other | Admitting: *Deleted

## 2011-04-10 ENCOUNTER — Ambulatory Visit (HOSPITAL_COMMUNITY): Payer: Medicare Other | Attending: Internal Medicine | Admitting: Radiology

## 2011-04-10 DIAGNOSIS — I4891 Unspecified atrial fibrillation: Secondary | ICD-10-CM

## 2011-04-10 DIAGNOSIS — R079 Chest pain, unspecified: Secondary | ICD-10-CM

## 2011-04-10 DIAGNOSIS — I639 Cerebral infarction, unspecified: Secondary | ICD-10-CM

## 2011-04-10 DIAGNOSIS — I4949 Other premature depolarization: Secondary | ICD-10-CM

## 2011-04-10 DIAGNOSIS — I491 Atrial premature depolarization: Secondary | ICD-10-CM

## 2011-04-10 DIAGNOSIS — I6350 Cerebral infarction due to unspecified occlusion or stenosis of unspecified cerebral artery: Secondary | ICD-10-CM

## 2011-04-10 LAB — PROTIME-INR: Prothrombin Time: 74.6 s (ref 10.2–12.4)

## 2011-04-10 LAB — POCT INR: INR: 6.1

## 2011-04-10 MED ORDER — TECHNETIUM TC 99M TETROFOSMIN IV KIT
33.0000 | PACK | Freq: Once | INTRAVENOUS | Status: AC | PRN
  Administered 2011-04-10: 33 via INTRAVENOUS

## 2011-04-10 MED ORDER — REGADENOSON 0.4 MG/5ML IV SOLN
0.4000 mg | Freq: Once | INTRAVENOUS | Status: AC
  Administered 2011-04-10: 0.4 mg via INTRAVENOUS

## 2011-04-10 MED ORDER — TECHNETIUM TC 99M TETROFOSMIN IV KIT
10.6000 | PACK | Freq: Once | INTRAVENOUS | Status: AC | PRN
  Administered 2011-04-10: 11 via INTRAVENOUS

## 2011-04-10 NOTE — Progress Notes (Signed)
[facility] SITE 3 NUCLEAR MED [address] [place] [address] [phone]  Cardiology Nuclear Med Study  Linda [name] is a 70 y.o. female [id number] [birth year]   Nuclear Med Background Indication for Stress Test:  Evaluation for Ischemia History:  No previous documented CAD, ECHO 60-65%, AFIB/RVR Cardiac Risk Factors: CVA, Hypertension, Lipids and Smoker  Symptoms:  Chest Pain and Palpitations   Nuclear Pre-Procedure Caffeine/Decaff Intake:  None NPO After: 7:00pm   Lungs:  clear IV 0.9% NS with Angio Cath:  22g  IV Site: R Hand  IV Started by:  Stanton Kidney, EMT-P  Chest Size (in):  34 Cup Size: B  Height: 5\' 1"  (1.549 m)  Weight:  121 lb (54.885 kg)  BMI:  Body mass index is 22.86 kg/(m^2). Tech Comments:  NA    Nuclear Med Study 1 or 2 day study: 1 day  Stress Test Type:  Treadmill/Lexiscan  Reading MD: Kristeen Miss, MD  Order Authorizing Provider:  D.Bensimhon/S.Weaver  Resting Radionuclide: Technetium 1m Tetrofosmin  Resting Radionuclide Dose: 10.6 mCi   Stress Radionuclide:  Technetium 89m Tetrofosmin  Stress Radionuclide Dose: 33.0 mCi           Stress Protocol Rest HR: 54 Stress HR: 120  Rest BP: 136/65 Stress BP: 187/84  Exercise Time (min): 5:29 METS: 6.0   Predicted Max HR: 151 bpm % Max HR: 79.47 bpm Rate Pressure Product: 21308   Dose of Adenosine (mg):  n/a Dose of Lexiscan: 0.4 mg  Dose of Atropine (mg): n/a Dose of Dobutamine: n/a mcg/kg/min (at max HR)  Stress Test Technologist: Milana Na, EMT-P  Nuclear Technologist:  Doyne Keel, CNMT     Rest Procedure:  Myocardial perfusion imaging was performed at rest 45 minutes following the intravenous administration of Technetium 15m Tetrofosmin. Rest ECG: NSR  Stress Procedure:  The patient received IV Lexiscan 0.4 mg over 15-seconds with concurrent low level exercise and then Technetium 69m Tetrofosmin was injected at 30-seconds while the patient continued walking one  more minute.  There were non significant changes, very fatigued, and rare pacs/pvcs with Lexiscan.  Quantitative spect images were obtained after a 45-minute delay. Stress ECG: No significant change from baseline ECG  QPS Raw Data Images:  Normal; no motion artifact; normal heart/lung ratio. Stress Images:  Normal homogeneous uptake in all areas of the myocardium. Rest Images:  Normal homogeneous uptake in all areas of the myocardium. Subtraction (SDS):  Normal Transient Ischemic Dilatation (Normal <1.22):  1.01 Lung/Heart Ratio (Normal <0.45):  0.27  Quantitative Gated Spect Images QGS EDV:  64 ml QGS ESV:  20 ml QGS cine images:  NL LV Function; NL Wall Motion QGS EF: 69%  Impression Exercise Capacity:  Poor exercise capacity.  Pt received Lexiscan at the end of exercise. BP Response:  Normal blood pressure response. Clinical Symptoms:  Fatigue ECG Impression:  No significant ST segment change suggestive of ischemia.  Inadequate HR response. Comparison with Prior Nuclear Study: No images to compare  Overall Impression:  Normal stress nuclear study.  No evidence of ischemia.  Normal LV function.    Vesta Mixer, Montez Hageman., MD, [facility]

## 2011-04-13 ENCOUNTER — Telehealth: Payer: Self-pay | Admitting: *Deleted

## 2011-04-13 NOTE — Telephone Encounter (Signed)
granddaughter is aware of stress test results are normal. pt does not speak english per granddaughter. Danielle Rankin

## 2011-04-17 ENCOUNTER — Ambulatory Visit (INDEPENDENT_AMBULATORY_CARE_PROVIDER_SITE_OTHER): Payer: Medicare Other | Admitting: *Deleted

## 2011-04-17 DIAGNOSIS — I6350 Cerebral infarction due to unspecified occlusion or stenosis of unspecified cerebral artery: Secondary | ICD-10-CM

## 2011-04-17 DIAGNOSIS — I4891 Unspecified atrial fibrillation: Secondary | ICD-10-CM

## 2011-04-17 DIAGNOSIS — I639 Cerebral infarction, unspecified: Secondary | ICD-10-CM

## 2011-04-17 LAB — POCT INR: INR: 1.1

## 2011-04-26 ENCOUNTER — Ambulatory Visit: Payer: Medicare Other | Admitting: Internal Medicine

## 2011-05-01 ENCOUNTER — Telehealth: Payer: Self-pay | Admitting: Internal Medicine

## 2011-05-01 ENCOUNTER — Ambulatory Visit (INDEPENDENT_AMBULATORY_CARE_PROVIDER_SITE_OTHER): Payer: Medicare Other | Admitting: *Deleted

## 2011-05-01 DIAGNOSIS — I4891 Unspecified atrial fibrillation: Secondary | ICD-10-CM

## 2011-05-01 DIAGNOSIS — I6350 Cerebral infarction due to unspecified occlusion or stenosis of unspecified cerebral artery: Secondary | ICD-10-CM

## 2011-05-01 DIAGNOSIS — I639 Cerebral infarction, unspecified: Secondary | ICD-10-CM

## 2011-05-01 LAB — POCT INR: INR: 2.1

## 2011-05-01 MED ORDER — WARFARIN SODIUM 5 MG PO TABS: ORAL_TABLET | ORAL | Status: DC

## 2011-05-01 NOTE — Telephone Encounter (Signed)
Clarified Amlodipine dose with the pharmacy.

## 2011-05-01 NOTE — Telephone Encounter (Signed)
Grandaughter told the coumadin clininc nurses that pt stopped her Lipitor in August and is doing better.  No myalgias.  She wants to know if she should be on another chol. Medication.

## 2011-05-01 NOTE — Telephone Encounter (Signed)
Clarification on medication amlodipine direction/ dosage

## 2011-05-02 MED ORDER — PRAVASTATIN SODIUM 20 MG PO TABS: 20.0000 mg | ORAL_TABLET | Freq: Every evening | ORAL | Status: DC

## 2011-05-02 NOTE — Telephone Encounter (Signed)
s/w grandaughter who is aware of lab results and to start pravastatin 20. Linda [name]

## 2011-05-02 NOTE — Telephone Encounter (Signed)
Try pravastatin 20 mg q.h.s. Repeat lipids and LFTs in 6-8 weeks Tereso Newcomer, PA-C

## 2011-05-05 ENCOUNTER — Encounter: Payer: Self-pay | Admitting: Internal Medicine

## 2011-05-05 DIAGNOSIS — Z0000 Encounter for general adult medical examination without abnormal findings: Secondary | ICD-10-CM | POA: Insufficient documentation

## 2011-05-08 ENCOUNTER — Encounter: Payer: Self-pay | Admitting: Internal Medicine

## 2011-05-08 ENCOUNTER — Other Ambulatory Visit (INDEPENDENT_AMBULATORY_CARE_PROVIDER_SITE_OTHER): Payer: Medicare Other

## 2011-05-08 ENCOUNTER — Encounter: Payer: Medicare Other | Admitting: *Deleted

## 2011-05-08 ENCOUNTER — Ambulatory Visit (INDEPENDENT_AMBULATORY_CARE_PROVIDER_SITE_OTHER): Payer: Medicare Other | Admitting: Internal Medicine

## 2011-05-08 VITALS — BP 138/70 | HR 64 | Temp 98.2°F | Ht 60.0 in | Wt 123.0 lb

## 2011-05-08 DIAGNOSIS — R7309 Other abnormal glucose: Secondary | ICD-10-CM

## 2011-05-08 DIAGNOSIS — Z23 Encounter for immunization: Secondary | ICD-10-CM

## 2011-05-08 DIAGNOSIS — I639 Cerebral infarction, unspecified: Secondary | ICD-10-CM

## 2011-05-08 DIAGNOSIS — R7302 Impaired glucose tolerance (oral): Secondary | ICD-10-CM

## 2011-05-08 DIAGNOSIS — F17200 Nicotine dependence, unspecified, uncomplicated: Secondary | ICD-10-CM | POA: Insufficient documentation

## 2011-05-08 DIAGNOSIS — E785 Hyperlipidemia, unspecified: Secondary | ICD-10-CM

## 2011-05-08 DIAGNOSIS — Z79899 Other long term (current) drug therapy: Secondary | ICD-10-CM

## 2011-05-08 DIAGNOSIS — I10 Essential (primary) hypertension: Secondary | ICD-10-CM

## 2011-05-08 DIAGNOSIS — R079 Chest pain, unspecified: Secondary | ICD-10-CM

## 2011-05-08 DIAGNOSIS — I4891 Unspecified atrial fibrillation: Secondary | ICD-10-CM

## 2011-05-08 DIAGNOSIS — I6350 Cerebral infarction due to unspecified occlusion or stenosis of unspecified cerebral artery: Secondary | ICD-10-CM

## 2011-05-08 DIAGNOSIS — I5189 Other ill-defined heart diseases: Secondary | ICD-10-CM | POA: Insufficient documentation

## 2011-05-08 HISTORY — DX: Impaired glucose tolerance (oral): R73.02

## 2011-05-08 LAB — HEPATIC FUNCTION PANEL
Albumin: 4.4 g/dL (ref 3.5–5.2)
Total Bilirubin: 0.5 mg/dL (ref 0.3–1.2)

## 2011-05-08 LAB — BASIC METABOLIC PANEL
BUN: 11 mg/dL (ref 6–23)
CO2: 27 mEq/L (ref 19–32)
Calcium: 9.2 mg/dL (ref 8.4–10.5)
Chloride: 106 mEq/L (ref 96–112)
Creatinine, Ser: 0.6 mg/dL (ref 0.4–1.2)

## 2011-05-08 LAB — LIPID PANEL
HDL: 44.3 mg/dL (ref >39.00)
Triglycerides: 198 mg/dL — ABNORMAL HIGH (ref 0.0–149.0)
VLDL: 39.6 mg/dL (ref 0.0–40.0)

## 2011-05-08 LAB — HEMOGLOBIN A1C: Hgb A1c MFr Bld: 6.4 % (ref 4.6–6.5)

## 2011-05-08 MED ORDER — ATORVASTATIN CALCIUM 10 MG PO TABS: 10.0000 mg | ORAL_TABLET | Freq: Every day | ORAL | Status: DC

## 2011-05-08 MED ORDER — AMLODIPINE BESYLATE 10 MG PO TABS: 10.0000 mg | ORAL_TABLET | Freq: Every day | ORAL | Status: DC

## 2011-05-08 MED ORDER — CLOBETASOL PROPIONATE 0.05 % EX CREA: TOPICAL_CREAM | Freq: Two times a day (BID) | CUTANEOUS | Status: AC

## 2011-05-08 NOTE — Assessment & Plan Note (Signed)
With ldl 185 per echart April 2012, now on lipitor 10, suspect she may need higher strength,. No further leg pain;will check labs today, goal ldl < 70

## 2011-05-08 NOTE — Progress Notes (Signed)
Subjective:    Patient ID: Linda [name], female    DOB: [birth year], 70 y.o.   MRN: [id number]  HPI  Here to establish as new pt with [place] interpretor;  Pt denies chest pain, increased sob or doe, wheezing, orthopnea, PND, increased LE swelling, palpitations, dizziness or syncope, excepy for occasional sharp fleeting pains.  Pt denies new neurological symptoms such as new headache, or facial or extremity weakness or numbness, though still has residual right sided weakness s/p CVA.   Pt denies polydipsia, polyuria, though did have several sugars elev while hospd April 2012, a1c normal - 6.2   Pt states overall good compliance with meds, trying to follow lower cholesterol diet, wt overall stable but little exercise however. Pt is confused - she has been given pravastatin 20 per cardiology , but had been taking lipitor 10 prior and is still taking with the pravastatin.   Pt denies fever, wt loss, night sweats, loss of appetite, or other constitutional symptoms.  Has had several mild HA's lately wihtout other neuro symptoms and did not know what to take OTC.    No further leg pain - in retrospect it appears lipitor was not the cause, and rash somewhat better with clobetasol. Due for flu shot today Past Medical History  Diagnosis Date  . Hypertension   . Hyperlipidemia   . Tobacco abuse     Ongoing (3-6 cigarettes per day, 25 pack year history)  . History of CVA (cerebrovascular accident)     New diagnosis December 08, 2010  . Atrial fibrillation     dx in setting of stroke 4/12;  echo with EF 60-65%, mild LVH, mild AI, grade 1 diast dysfxn  . Diastolic dysfunction 05/08/2011  . Impaired glucose tolerance 05/08/2011   No past surgical history on file.  reports that she has been smoking Cigarettes.  She has a 25 pack-year smoking history. She does not have any smokeless tobacco history on file. She reports that she does not drink alcohol or use illicit drugs. family history is negative for Coronary  artery disease. No Known Allergies Current Outpatient Prescriptions on File Prior to Visit  Medication Sig Dispense Refill  . warfarin (COUMADIN) 5 MG tablet Take as directed by Anticoagulation clinic   30 tablet  1  . DISCONTD: amLODipine (NORVASC) 10 MG tablet Take 1 tablet (10 mg total) by mouth daily.  30 tablet  6   Review of Systems Review of Systems  Constitutional: Negative for diaphoresis and unexpected weight change.  HENT: Negative for drooling and tinnitus.   Eyes: Negative for photophobia and visual disturbance.  Respiratory: Negative for choking and stridor.   Gastrointestinal: Negative for vomiting and blood in stool.  Genitourinary: Negative for hematuria and decreased urine volume.    Objective:   Physical Exam BP 138/70  Pulse 64  Temp(Src) 98.2 F (36.8 C) (Oral)  Ht 5' (1.524 m)  Wt 123 lb (55.792 kg)  BMI 24.02 kg/m2  SpO2 98% Physical Exam  VS noted Constitutional: Pt appears well-developed and well-nourished.  HENT: Head: Normocephalic.  Right Ear: External ear normal.  Left Ear: External ear normal.  Eyes: Conjunctivae and EOM are normal. Pupils are equal, round, and reactive to light.  Neck: Normal range of motion. Neck supple.  Cardiovascular: Normal rate and regular rhythm.   Pulmonary/Chest: Effort normal and breath sounds normal.  Abd:  Soft, NT, non-distended, + BS Neurological: Pt is alert. No cranial nerve deficit. has 4/5 RUE/RLE weakness and patellar hyperreflexive Skin:  Skin is warm. No erythema. except for mild rash to fingertips, dryness Psychiatric: Pt behavior is normal. Thought content normal.         Assessment & Plan:

## 2011-05-08 NOTE — Assessment & Plan Note (Signed)
Fleeting, sharp, exam benign,has seen cardiology,  to f/u any worsening symptoms or concerns

## 2011-05-08 NOTE — Assessment & Plan Note (Signed)
stable overall by hx and exam, most recent data reviewed with pt, and pt to continue medical treatment as before  For repaet a1c today

## 2011-05-08 NOTE — Patient Instructions (Addendum)
You had the flu shot today OK to stop the pravastatin Continue all other medications as before; the cream, amlodipine, and atorvastatin were refilled to the pharmacy Please go to LAB in the Basement for the blood and/or urine tests to be done today Please call the phone number [phone] (the PhoneTree System) for results of testing in 2-3 days;  When calling, simply dial the number, and when prompted enter the MRN number above (the Medical Record Number) and the # key, then the message should start. Please return in 6 months, or sooner if needed

## 2011-05-08 NOTE — Progress Notes (Signed)
Addended by: Scharlene Gloss B on: 05/08/2011 10:51 AM   Modules accepted: Orders

## 2011-05-08 NOTE — Assessment & Plan Note (Signed)
BP Readings from Last 3 Encounters:  05/08/11 138/70  03/27/11 157/83   Improved with increased amlodipine; Continue all other medications as before

## 2011-05-09 ENCOUNTER — Ambulatory Visit (INDEPENDENT_AMBULATORY_CARE_PROVIDER_SITE_OTHER): Payer: Medicare Other | Admitting: *Deleted

## 2011-05-09 ENCOUNTER — Ambulatory Visit (INDEPENDENT_AMBULATORY_CARE_PROVIDER_SITE_OTHER): Payer: Medicare Other | Admitting: Internal Medicine

## 2011-05-09 ENCOUNTER — Encounter: Payer: Self-pay | Admitting: Internal Medicine

## 2011-05-09 ENCOUNTER — Ambulatory Visit: Payer: Medicare Other | Admitting: Internal Medicine

## 2011-05-09 VITALS — BP 112/68 | HR 61 | Wt 121.0 lb

## 2011-05-09 DIAGNOSIS — I4891 Unspecified atrial fibrillation: Secondary | ICD-10-CM

## 2011-05-09 DIAGNOSIS — I6350 Cerebral infarction due to unspecified occlusion or stenosis of unspecified cerebral artery: Secondary | ICD-10-CM

## 2011-05-09 DIAGNOSIS — F17200 Nicotine dependence, unspecified, uncomplicated: Secondary | ICD-10-CM

## 2011-05-09 DIAGNOSIS — I639 Cerebral infarction, unspecified: Secondary | ICD-10-CM

## 2011-05-09 LAB — POCT INR: INR: 1.8

## 2011-05-09 MED ORDER — PROPAFENONE HCL ER 225 MG PO CP12: 225.0000 mg | ORAL_CAPSULE | Freq: Two times a day (BID) | ORAL | Status: DC

## 2011-05-09 NOTE — Patient Instructions (Addendum)
Your physician wants you to follow-up in: one month with Linda [name].   You will receive a reminder letter in the mail two months in advance. If you don't receive a letter, please call our office to schedule the follow-up appointment  Your physician recommends that you return for an ekg in one week  Your physician has recommended you make the following change in your medication:  Start Propafenone SR 225mg  twice daily

## 2011-05-09 NOTE — Progress Notes (Signed)
  History of Present Illness: Primary Cardiologist:  Dr. Arvilla Meres  Linda [name] is a 69 y.o. [place] female with a h/o HTN, hyperlipidemia and tobacco abuse who was diagnosed with paroxysmal atrial fibrillation/flutter during a hospitalization in 4/12 for a left pontine stroke.  She had no significant ICA stenosis by carotid dopplers.  Echo demonstrated EF 60-65%, mild LVH, grade 1 diast dysfxn, mild AI.  I saw her in the hospital and ordered a Myoview but she did not f/u until recently when she saw Tereso Newcomer who ordered a Myoview and event monitor.  She is here today with an interpreter. I have reviewed her monitor and stress test. Myoview with EF 66% no ischemia.  Event monitor 66% up to 160. When in sinus rate can go as low as 40s.   Overall feeling much better. No CP or SOB. Gets tachypalpitations several times per week. Last a minute or so. No syncope or presyncope. No bleeding with coumadin. Still smoking several cigarettes per day.      Past Medical History  Diagnosis Date  . Hypertension   . Hyperlipidemia   . Tobacco abuse     Ongoing (3-6 cigarettes per day, 25 pack year history)  . History of CVA (cerebrovascular accident)     New diagnosis December 08, 2010  . Atrial fibrillation     dx in setting of stroke 4/12;  echo with EF 60-65%, mild LVH, mild AI, grade 1 diast dysfxn  . Diastolic dysfunction 05/08/2011  . Impaired glucose tolerance 05/08/2011    Current Outpatient Prescriptions  Medication Sig Dispense Refill  . amLODipine (NORVASC) 10 MG tablet Take 1 tablet (10 mg total) by mouth daily.  30 tablet  6  . atorvastatin (LIPITOR) 10 MG tablet Take 10 mg by mouth daily.        . clobetasol (TEMOVATE) 0.05 % cream Apply topically 2 (two) times daily.  30 g  0  . warfarin (COUMADIN) 5 MG tablet Take as directed by Anticoagulation clinic   30 tablet  1    Allergies: No Known Allergies  Social history:  She continues to smoke cigarettes  ROS:  Please see the  history of present illness.  All other systems reviewed and negative.   Vital Signs: There were no vitals taken for this visit.  PHYSICAL EXAM: Well nourished, well developed, in no acute distress HEENT: normal Neck: no JVD Vascular: No carotid bruits; DP/PT 2+ bilaterally Endocrine: No thyromegaly Cardiac:  normal S1, S2; RRR; no murmur Lungs:  clear to auscultation bilaterally, no wheezing, rhonchi or rales Abd: soft, nontender, no hepatomegaly, normal bowel sounds Ext: no edema Musculoskeletal: No obvious deformities Skin: warm and dry; fingertips of bilateral hands with diffuse scaling and drying with associated pitting, nails without pits Neuro:  CNs 2-12 intact, no focal abnormalities noted  EKG:  NSR 61 No ST-T wave abnormalities. QTc   ASSESSMENT AND PLAN:

## 2011-05-10 NOTE — Assessment & Plan Note (Signed)
She has a high burden of AF on her monitor with relatively frequent tachypapitations. This is complicated by resting bradycardia. We had a long talk about the options and I also reviewed with Drs. Graciela Husbands and Ladona Ridgel. Initially I considered flecainide but i dont think her HR will tolerate the b-blocker or cardizem that we would need ad adjunctive therapy. QT probably too long for Tikosyn. Thus will initiate Propafenone 225 SR bid. Will need f/u ECG in 1 week to assess QRS duration. Continue coumadin.

## 2011-05-10 NOTE — Assessment & Plan Note (Signed)
Counseled on need to stop smoking.  

## 2011-05-22 ENCOUNTER — Ambulatory Visit (INDEPENDENT_AMBULATORY_CARE_PROVIDER_SITE_OTHER): Payer: Medicare Other

## 2011-05-22 ENCOUNTER — Other Ambulatory Visit: Payer: Self-pay | Admitting: *Deleted

## 2011-05-22 ENCOUNTER — Ambulatory Visit (INDEPENDENT_AMBULATORY_CARE_PROVIDER_SITE_OTHER): Payer: Medicare Other | Admitting: *Deleted

## 2011-05-22 DIAGNOSIS — I4891 Unspecified atrial fibrillation: Secondary | ICD-10-CM

## 2011-05-22 DIAGNOSIS — I6350 Cerebral infarction due to unspecified occlusion or stenosis of unspecified cerebral artery: Secondary | ICD-10-CM

## 2011-05-22 DIAGNOSIS — R079 Chest pain, unspecified: Secondary | ICD-10-CM

## 2011-05-22 DIAGNOSIS — I639 Cerebral infarction, unspecified: Secondary | ICD-10-CM

## 2011-05-22 MED ORDER — ATORVASTATIN CALCIUM 10 MG PO TABS: 10.0000 mg | ORAL_TABLET | Freq: Every day | ORAL | Status: DC

## 2011-05-22 MED ORDER — AMLODIPINE BESYLATE 10 MG PO TABS: 10.0000 mg | ORAL_TABLET | Freq: Every day | ORAL | Status: DC

## 2011-05-22 NOTE — Progress Notes (Signed)
Pt here today for a EKG HR 62 NSR.  Pt is without complaints and states she is taking all medications as listed.  Will have EKG scanned and reviewed by Dr Gala Romney.

## 2011-06-05 ENCOUNTER — Encounter: Payer: Medicare Other | Admitting: *Deleted

## 2011-06-12 ENCOUNTER — Ambulatory Visit: Payer: Medicare Other | Admitting: Physician Assistant

## 2011-08-23 ENCOUNTER — Encounter: Payer: Self-pay | Admitting: Internal Medicine

## 2011-11-15 ENCOUNTER — Encounter: Payer: Self-pay | Admitting: Internal Medicine

## 2012-06-17 ENCOUNTER — Other Ambulatory Visit: Payer: Self-pay | Admitting: Internal Medicine

## 2012-06-24 ENCOUNTER — Other Ambulatory Visit: Payer: Self-pay | Admitting: Internal Medicine

## 2012-07-01 ENCOUNTER — Telehealth: Payer: Self-pay | Admitting: *Deleted

## 2012-07-01 NOTE — Telephone Encounter (Signed)
Talked with pts granddaughter to set up appt to be seen tomorrow to have her INR checked as she missed appt in October. Also instructed granddaughter to call  If she needs coumadin before tomorrow

## 2012-07-02 ENCOUNTER — Ambulatory Visit (INDEPENDENT_AMBULATORY_CARE_PROVIDER_SITE_OTHER): Payer: Medicare Other | Admitting: *Deleted

## 2012-07-02 DIAGNOSIS — I4891 Unspecified atrial fibrillation: Secondary | ICD-10-CM

## 2012-07-02 DIAGNOSIS — I639 Cerebral infarction, unspecified: Secondary | ICD-10-CM

## 2012-07-02 DIAGNOSIS — I6350 Cerebral infarction due to unspecified occlusion or stenosis of unspecified cerebral artery: Secondary | ICD-10-CM

## 2012-07-02 LAB — POCT INR: INR: 0.9

## 2012-07-02 MED ORDER — WARFARIN SODIUM 5 MG PO TABS: ORAL_TABLET | ORAL | Status: DC

## 2012-07-09 ENCOUNTER — Ambulatory Visit (INDEPENDENT_AMBULATORY_CARE_PROVIDER_SITE_OTHER): Payer: Medicare Other | Admitting: *Deleted

## 2012-07-09 DIAGNOSIS — I4891 Unspecified atrial fibrillation: Secondary | ICD-10-CM

## 2012-07-09 DIAGNOSIS — I6350 Cerebral infarction due to unspecified occlusion or stenosis of unspecified cerebral artery: Secondary | ICD-10-CM

## 2012-07-09 DIAGNOSIS — I639 Cerebral infarction, unspecified: Secondary | ICD-10-CM

## 2012-07-09 LAB — POCT INR: INR: 1.8

## 2012-07-16 ENCOUNTER — Encounter: Payer: Self-pay | Admitting: Physician Assistant

## 2012-07-16 ENCOUNTER — Ambulatory Visit (INDEPENDENT_AMBULATORY_CARE_PROVIDER_SITE_OTHER): Payer: Medicare Other | Admitting: Physician Assistant

## 2012-07-16 ENCOUNTER — Ambulatory Visit (INDEPENDENT_AMBULATORY_CARE_PROVIDER_SITE_OTHER): Payer: Medicare Other | Admitting: Pharmacist

## 2012-07-16 VITALS — BP 160/79 | HR 67 | Ht 60.0 in | Wt 122.6 lb

## 2012-07-16 DIAGNOSIS — I4891 Unspecified atrial fibrillation: Secondary | ICD-10-CM

## 2012-07-16 DIAGNOSIS — I639 Cerebral infarction, unspecified: Secondary | ICD-10-CM

## 2012-07-16 DIAGNOSIS — I6350 Cerebral infarction due to unspecified occlusion or stenosis of unspecified cerebral artery: Secondary | ICD-10-CM

## 2012-07-16 DIAGNOSIS — I10 Essential (primary) hypertension: Secondary | ICD-10-CM

## 2012-07-16 LAB — POCT INR: INR: 3.1

## 2012-07-16 MED ORDER — AMLODIPINE BESYLATE 10 MG PO TABS: 10.0000 mg | ORAL_TABLET | Freq: Every day | ORAL | Status: DC

## 2012-07-16 MED ORDER — ATORVASTATIN CALCIUM 10 MG PO TABS: 10.0000 mg | ORAL_TABLET | Freq: Every day | ORAL | Status: DC

## 2012-07-16 MED ORDER — LISINOPRIL 10 MG PO TABS: 10.0000 mg | ORAL_TABLET | Freq: Every day | ORAL | Status: DC

## 2012-07-16 NOTE — Progress Notes (Signed)
[address], [address] [place], [place]  [address] Phone: [phone], Fax:  [phone]  Date:  07/16/2012   Name:  Linda [name]   DOB:  1940/09/05   MRN:  [id number]  PCP:  No primary provider on file.  Primary Cardiologist:  Dr. Arvilla Meres  Primary Electrophysiologist:  None    History of Present Illness: Linda [name] is a 71 y.o. [place] female with a h/o HTN, HL and tobacco abuse who was diagnosed with paroxysmal atrial fibrillation/flutter during a hospitalization in 4/12 for a left pontine stroke. She had no significant ICA stenosis by carotid dopplers. Echo demonstrated EF 60-65%, mild LVH, grade 1 diast dysfxn, mild AI.  Myoview 8/12:  EF 69%, no ischemia.  Event monitor in 8/12 with high AFib burden.  Last saw Dr. Arvilla Meres in 9/12.  Patient was placed on Propafenone SR 225 mg BID.  She was to follow up with me one month later.  This is her first visit back.    She had been off of her coumadin until seen in coumadin clinic earlier this month.  She has follow up today.  She is here with an interpreter.  She has chronic chest pain.  There has been no change over 4 years.  Denies significant dyspnea.  Notes Class II-IIb symptoms.  No orthopnea, PND, edema.  She has occasional palpitations, but these are much less frequent.  No syncope.    Labs (4/12):  TSH 1.417 Labs (9/12):  K 3.7, creatinine 0.6, ALT 35, LDL 102   Wt Readings from Last 3 Encounters:  07/16/12 122 lb 9.6 oz (55.611 kg)  05/09/11 121 lb (54.885 kg)  05/08/11 123 lb (55.792 kg)     Past Medical History  Diagnosis Date  . Hypertension   . Hyperlipidemia   . Tobacco abuse     Ongoing (3-6 cigarettes per day, 25 pack year history)  . History of CVA (cerebrovascular accident)     New diagnosis December 08, 2010  . Atrial fibrillation     dx in setting of stroke 4/12;  echo with EF 60-65%, mild LVH, mild AI, grade 1 diast dysfxn  . Impaired glucose tolerance 05/08/2011  . Hx of  cardiovascular stress test     a. Myoview 8/12:  EF 69%, no ischemia.     Current Outpatient Prescriptions  Medication Sig Dispense Refill  . amLODipine (NORVASC) 10 MG tablet TAKE ONE TABLET BY MOUTH DAILY  30 tablet  1  . atorvastatin (LIPITOR) 10 MG tablet TAKE 1 TABLET BY MOUTH DAILY  30 tablet  0  . warfarin (COUMADIN) 5 MG tablet Take as directed by Anticoagulation clinic  30 tablet  0  . propafenone (RYTHMOL SR) 225 MG 12 hr capsule Take 1 capsule (225 mg total) by mouth 2 (two) times daily.  60 capsule  11    Allergies:  No Known Allergies  Social History:  The patient  reports that she has been smoking Cigarettes.  She has a 25 pack-year smoking history. She does not have any smokeless tobacco history on file. She reports that she does not drink alcohol or use illicit drugs.   ROS:  Please see the history of present illness.   Denies any bleeding problems.  She has a lot of leg pain.  All other systems reviewed and negative.   PHYSICAL EXAM: VS:  BP 160/79  Pulse 67  Ht 5' (1.524 m)  Wt 122 lb 9.6 oz (55.611 kg)  BMI 23.94 kg/m2 Well nourished, well developed, in no acute distress HEENT: normal Neck: no JVD Cardiac:  normal S1, S2; RRR; no murmur Lungs:  clear to auscultation bilaterally, no wheezing, rhonchi or rales Abd: soft, nontender, no hepatomegaly Ext: no edema Skin: warm and dry Neuro:  CNs 2-12 intact, no focal abnormalities noted  EKG:  NSR, HR 67, normal axis, no acute changes, QTc 467 ms      ASSESSMENT AND PLAN:  1. Atrial Fibrillation:   She has a CHADS2 score of 3.  She requires long term anticoagulation.  I d/w the patient at length today with the help of the interpreter.  She is not taking Rythmol.  I am not certain she ever took it.  At this point, I do not know if she is a good candidate for anti-arrhythmic.  Compliance with coumadin is also questionable.  I have reviewed her chart.  Notes from Dr. Arvilla Meres indicate she had a high AFib  burden.  I cannot find those strips.  The scanned documents indicate NSR only.      -  Continue Coumadin and follow up with coumadin clinic.    -  Arrange follow up with one of our Electrophysiologists to review and help decide if we need to pursue anti-arrhythmic drug therapy.  2. Hypertension:  Uncontrolled.  Add Lisinopril 10 mg QD.  Check BMET in one week.  3. Hyperlipidemia:   Check fasting Lipids and LFTs.  4. Disposition:   Follow up with EP as noted.  Arrange referral to PCP.    Signed, Tereso Newcomer, PA-C  4:12 PM 07/16/2012

## 2012-07-16 NOTE — Patient Instructions (Addendum)
You have been referred to PRIMARY CARE DX HTN   You have been referred to PREVIOUS PT OF DR. Gala Romney AND NEEDS TO BE REFERRED TO ONE OF THE EP DR. TO BE SEEN IN THE 2-3 MONTHS DX 427.31  START LISINOPRIL 10 MG DAILY REFILLS WERE SENT IN TODAY FOR LIPITOR, AND AMLODIPINE  Your physician recommends that you return for lab work in: 07/26/12 FASTING LIPID AND LIVER PANEL, BMET

## 2012-07-26 ENCOUNTER — Other Ambulatory Visit: Payer: Medicare Other

## 2012-07-26 ENCOUNTER — Other Ambulatory Visit (INDEPENDENT_AMBULATORY_CARE_PROVIDER_SITE_OTHER): Payer: Medicare Other

## 2012-07-26 ENCOUNTER — Ambulatory Visit (INDEPENDENT_AMBULATORY_CARE_PROVIDER_SITE_OTHER): Payer: Medicare Other | Admitting: *Deleted

## 2012-07-26 DIAGNOSIS — I4891 Unspecified atrial fibrillation: Secondary | ICD-10-CM

## 2012-07-26 DIAGNOSIS — I10 Essential (primary) hypertension: Secondary | ICD-10-CM

## 2012-07-26 DIAGNOSIS — I639 Cerebral infarction, unspecified: Secondary | ICD-10-CM

## 2012-07-26 DIAGNOSIS — I6350 Cerebral infarction due to unspecified occlusion or stenosis of unspecified cerebral artery: Secondary | ICD-10-CM

## 2012-07-26 LAB — HEPATIC FUNCTION PANEL
ALT: 28 U/L (ref 0–35)
AST: 22 U/L (ref 0–37)
Albumin: 4 g/dL (ref 3.5–5.2)
Alkaline Phosphatase: 72 U/L (ref 39–117)
Total Bilirubin: 0.6 mg/dL (ref 0.3–1.2)

## 2012-07-26 LAB — BASIC METABOLIC PANEL
BUN: 12 mg/dL (ref 6–23)
Chloride: 104 mEq/L (ref 96–112)
GFR: 90.62 mL/min (ref >60.00)
Glucose, Bld: 135 mg/dL — ABNORMAL HIGH (ref 70–99)
Potassium: 4 mEq/L (ref 3.5–5.1)
Sodium: 139 mEq/L (ref 135–145)

## 2012-07-26 LAB — LIPID PANEL
Total CHOL/HDL Ratio: 5
VLDL: 43.2 mg/dL — ABNORMAL HIGH (ref 0.0–40.0)

## 2012-07-26 LAB — LDL CHOLESTEROL, DIRECT: Direct LDL: 106.4 mg/dL

## 2012-07-26 MED ORDER — WARFARIN SODIUM 5 MG PO TABS: ORAL_TABLET | ORAL | Status: DC

## 2012-07-29 ENCOUNTER — Telehealth: Payer: Self-pay | Admitting: *Deleted

## 2012-07-29 DIAGNOSIS — E785 Hyperlipidemia, unspecified: Secondary | ICD-10-CM

## 2012-07-29 DIAGNOSIS — I10 Essential (primary) hypertension: Secondary | ICD-10-CM

## 2012-07-29 MED ORDER — ATORVASTATIN CALCIUM 20 MG PO TABS: 20.0000 mg | ORAL_TABLET | Freq: Every day | ORAL | Status: DC

## 2012-07-29 NOTE — Telephone Encounter (Signed)
lmptcb to go over lab results and med dose change, repeat bmet to be done in 1 week, see lab results for instructions 

## 2012-07-29 NOTE — Telephone Encounter (Signed)
Message copied by Tarri Fuller on Mon Jul 29, 2012 11:13 AM ------      Message from: [place], [place] T      Created: Mon Jul 29, 2012  8:27 AM       Potassium and kidney function look good.      Cholesterol numbers ok.  Could be better (HDL > 50 and LDL < 100).      Increase Lipitor to 20 mg QD.      Check Lipids and LFTs in 3 mos.      Tereso Newcomer, PA-C  4:49 PM 07/04/2012

## 2012-08-05 ENCOUNTER — Telehealth: Payer: Self-pay | Admitting: *Deleted

## 2012-08-05 NOTE — Telephone Encounter (Signed)
Linda [name] was referred to PCP by Tereso Newcomer 07/16/12. Patient has PCP listed on her medicaid card. I spoke to daughter and explained to her that she needed to get the name that is listed on her mothers medicaid card changed before we can set her up with a PCP at [facility]. Per daughter she will take care of it.

## 2012-08-05 NOTE — Telephone Encounter (Signed)
Patient has an appointment with Dr. Sampson Si 08/07/12 @ 10:30 per grandaughter.

## 2012-08-07 ENCOUNTER — Ambulatory Visit (INDEPENDENT_AMBULATORY_CARE_PROVIDER_SITE_OTHER): Payer: Medicare Other | Admitting: Internal Medicine

## 2012-08-07 ENCOUNTER — Encounter: Payer: Self-pay | Admitting: Internal Medicine

## 2012-08-07 ENCOUNTER — Other Ambulatory Visit (INDEPENDENT_AMBULATORY_CARE_PROVIDER_SITE_OTHER): Payer: Medicare Other

## 2012-08-07 VITALS — BP 106/80 | HR 62 | Temp 97.8°F | Ht 61.0 in | Wt 125.0 lb

## 2012-08-07 DIAGNOSIS — Z13220 Encounter for screening for lipoid disorders: Secondary | ICD-10-CM

## 2012-08-07 DIAGNOSIS — Z131 Encounter for screening for diabetes mellitus: Secondary | ICD-10-CM

## 2012-08-07 DIAGNOSIS — I4891 Unspecified atrial fibrillation: Secondary | ICD-10-CM

## 2012-08-07 DIAGNOSIS — Z1321 Encounter for screening for nutritional disorder: Secondary | ICD-10-CM

## 2012-08-07 DIAGNOSIS — Z130 Encounter for screening for diseases of the blood and blood-forming organs and certain disorders involving the immune mechanism: Secondary | ICD-10-CM

## 2012-08-07 DIAGNOSIS — E785 Hyperlipidemia, unspecified: Secondary | ICD-10-CM

## 2012-08-07 DIAGNOSIS — R5383 Other fatigue: Secondary | ICD-10-CM

## 2012-08-07 DIAGNOSIS — R7309 Other abnormal glucose: Secondary | ICD-10-CM

## 2012-08-07 DIAGNOSIS — Z0000 Encounter for general adult medical examination without abnormal findings: Secondary | ICD-10-CM

## 2012-08-07 DIAGNOSIS — Z1329 Encounter for screening for other suspected endocrine disorder: Secondary | ICD-10-CM

## 2012-08-07 DIAGNOSIS — M8580 Other specified disorders of bone density and structure, unspecified site: Secondary | ICD-10-CM

## 2012-08-07 DIAGNOSIS — Z23 Encounter for immunization: Secondary | ICD-10-CM

## 2012-08-07 DIAGNOSIS — R5381 Other malaise: Secondary | ICD-10-CM

## 2012-08-07 LAB — PROTIME-INR
INR: 2.3 ratio — ABNORMAL HIGH (ref 0.8–1.0)
Prothrombin Time: 23.6 s — ABNORMAL HIGH (ref 10.2–12.4)

## 2012-08-07 LAB — BASIC METABOLIC PANEL
Calcium: 9.4 mg/dL (ref 8.4–10.5)
Creatinine, Ser: 0.7 mg/dL (ref 0.4–1.2)
GFR: 90.61 mL/min (ref >60.00)
Glucose, Bld: 115 mg/dL — ABNORMAL HIGH (ref 70–99)
Sodium: 139 mEq/L (ref 135–145)

## 2012-08-07 LAB — CBC
HCT: 46.5 % — ABNORMAL HIGH (ref 36.0–46.0)
Hemoglobin: 15.6 g/dL — ABNORMAL HIGH (ref 12.0–15.0)
MCV: 86.6 fl (ref 78.0–100.0)
RDW: 12.7 % (ref 11.5–14.6)

## 2012-08-07 LAB — LIPID PANEL
HDL: 41 mg/dL (ref >39.00)
Triglycerides: 287 mg/dL — ABNORMAL HIGH (ref 0.0–149.0)

## 2012-08-07 LAB — LDL CHOLESTEROL, DIRECT: Direct LDL: 124.5 mg/dL

## 2012-08-07 LAB — TSH: TSH: 1.28 u[IU]/mL (ref 0.35–5.50)

## 2012-08-07 NOTE — Patient Instructions (Addendum)

## 2012-08-07 NOTE — Progress Notes (Signed)
HPI  Pt presents to the clinic today to establish care. Upon further investigation, she has already been seen here by Dr. Jonny Ruiz in 2012 for chest pain. She was sent her by Tereso Newcomer, PA-C for evaluation of HTN. She is on amlodipine 10 mg and he started her on Lisinopril 10 mg daily. She never did start taking the lisinopril. Her blood pressure today is under good control. She is vietnamese speaking only and is accompanied by an interpreter. She has no concerns today.  Past Medical History  Diagnosis Date  . Hypertension   . Hyperlipidemia   . Tobacco abuse     Ongoing (3-6 cigarettes per day, 25 pack year history)  . History of CVA (cerebrovascular accident)     New diagnosis December 08, 2010  . Atrial fibrillation     dx in setting of stroke 4/12;  echo with EF 60-65%, mild LVH, mild AI, grade 1 diast dysfxn  . Impaired glucose tolerance 05/08/2011  . Hx of cardiovascular stress test     a. Myoview 8/12:  EF 69%, no ischemia.     Current Outpatient Prescriptions  Medication Sig Dispense Refill  . amLODipine (NORVASC) 10 MG tablet Take 1 tablet (10 mg total) by mouth daily.  30 tablet  11  . atorvastatin (LIPITOR) 20 MG tablet Take 1 tablet (20 mg total) by mouth daily.  30 tablet  11  . lisinopril (PRINIVIL,ZESTRIL) 10 MG tablet Take 1 tablet (10 mg total) by mouth daily.  30 tablet  11  . warfarin (COUMADIN) 5 MG tablet Take as directed by Anticoagulation clinic  30 tablet  3    No Known Allergies  Family History  Problem Relation Age of Onset  . Coronary artery disease Neg Hx     History   Social History  . Marital Status: Widowed    Spouse Name: N/A    Number of Children: N/A  . Years of Education: N/A   Occupational History  .  [employer]  . Not working    Social History Main Topics  . Smoking status: Current Every Day Smoker -- 1.0 packs/day for 25 years    Types: Cigarettes  . Smokeless tobacco: Not on file     Comment: 3-6 cigarettes per day  . Alcohol Use:  No  . Drug Use: No  . Sexually Active: Not on file   Other Topics Concern  . Not on file   Social History Narrative   [place] with her daughtersRegular dietNo regular exercise but active and independent with all daily activities    ROS:  Constitutional: Denies fever, malaise, fatigue, headache or abrupt weight changes.  HEENT: Denies eye pain, eye redness, ear pain, ringing in the ears, wax buildup, runny nose, nasal congestion, bloody nose, or sore throat. Respiratory: Denies difficulty breathing, shortness of breath, cough or sputum production.   Cardiovascular: Denies chest pain, chest tightness, palpitations or swelling in the hands or feet.  Gastrointestinal: Denies abdominal pain, bloating, constipation, diarrhea or blood in the stool.  GU: Denies frequency, urgency, pain with urination, blood in urine, odor or discharge. Musculoskeletal: Denies decrease in range of motion, difficulty with gait, muscle pain or joint pain and swelling.  Skin: Denies redness, rashes, lesions or ulcercations.  Neurological: Denies dizziness, difficulty with memory, difficulty with speech or problems with balance and coordination.   No other specific complaints in a complete review of systems (except as listed in HPI above).  PE:  BP 106/80  Pulse  62  Temp 97.8 F (36.6 C) (Oral)  Ht 5\' 1"  (1.549 m)  Wt 125 lb (56.7 kg)  BMI 23.62 kg/m2  SpO2 99% Wt Readings from Last 3 Encounters:  08/07/12 125 lb (56.7 kg)  07/16/12 122 lb 9.6 oz (55.611 kg)  05/09/11 121 lb (54.885 kg)    General: Appears her stated age, well developed, well nourished in NAD. HEENT: Head: normal shape and size; Eyes: sclera white, no icterus, conjunctiva pink, PERRLA and EOMs intact; Ears: Tm's gray and intact, normal light reflex; Nose: mucosa pink and moist, septum midline; Throat/Mouth: Teeth present, mucosa pink and moist, no lesions or ulcerations noted.  Neck: Normal range of motion. Neck supple,  trachea midline. No massses, lumps or thyromegaly present.  Cardiovascular: Normal rate and rhythm. S1,S2 noted.  No murmur, rubs or gallops noted. No JVD or BLE edema. No carotid bruits noted. Pulmonary/Chest: Normal effort and positive vesicular breath sounds. No respiratory distress. No wheezes, rales or ronchi noted.  Abdomen: Soft and nontender. Normal bowel sounds, no bruits noted. No distention or masses noted. Liver, spleen and kidneys non palpable. Musculoskeletal: Normal range of motion. No signs of joint swelling. No difficulty with gait.  Neurological: Alert and oriented. Cranial nerves II-XII intact. Coordination normal. +DTRs bilaterally. Psychiatric: Mood and affect normal. Behavior is normal. Judgment and thought content normal.     Assessment and Plan:  Preventative health Maintenance:  Will obtain basic screening labs today Pneumovax given today Pt declines flu and tetanus Pt declines mammogram, pap smear and colon screening Continue taking all meds as prescribed  Hypertension:  Take amlodipine 10 mg as prescribed Do not start taking the lisinopril  RTC in 3 months for reevaluation of HTN

## 2012-08-08 ENCOUNTER — Encounter: Payer: Self-pay | Admitting: Internal Medicine

## 2012-08-08 LAB — VITAMIN D 25 HYDROXY (VIT D DEFICIENCY, FRACTURES): Vit D, 25-Hydroxy: 13 ng/mL — ABNORMAL LOW (ref 30–89)

## 2012-08-09 ENCOUNTER — Ambulatory Visit (INDEPENDENT_AMBULATORY_CARE_PROVIDER_SITE_OTHER): Payer: Medicare Other | Admitting: *Deleted

## 2012-08-09 DIAGNOSIS — I639 Cerebral infarction, unspecified: Secondary | ICD-10-CM

## 2012-08-09 DIAGNOSIS — I4891 Unspecified atrial fibrillation: Secondary | ICD-10-CM

## 2012-08-09 DIAGNOSIS — I6350 Cerebral infarction due to unspecified occlusion or stenosis of unspecified cerebral artery: Secondary | ICD-10-CM

## 2012-08-09 MED ORDER — WARFARIN SODIUM 5 MG PO TABS: ORAL_TABLET | ORAL | Status: DC

## 2012-08-13 ENCOUNTER — Other Ambulatory Visit: Payer: Self-pay | Admitting: *Deleted

## 2012-08-13 MED ORDER — WARFARIN SODIUM 5 MG PO TABS: ORAL_TABLET | ORAL | Status: DC

## 2012-09-16 ENCOUNTER — Ambulatory Visit (INDEPENDENT_AMBULATORY_CARE_PROVIDER_SITE_OTHER): Payer: Medicare Other | Admitting: *Deleted

## 2012-09-16 ENCOUNTER — Ambulatory Visit (INDEPENDENT_AMBULATORY_CARE_PROVIDER_SITE_OTHER): Payer: Medicare Other | Admitting: Internal Medicine

## 2012-09-16 ENCOUNTER — Encounter: Payer: Self-pay | Admitting: Internal Medicine

## 2012-09-16 VITALS — BP 138/71 | HR 58 | Ht 60.0 in | Wt 122.0 lb

## 2012-09-16 DIAGNOSIS — I639 Cerebral infarction, unspecified: Secondary | ICD-10-CM

## 2012-09-16 DIAGNOSIS — F17200 Nicotine dependence, unspecified, uncomplicated: Secondary | ICD-10-CM

## 2012-09-16 DIAGNOSIS — I4891 Unspecified atrial fibrillation: Secondary | ICD-10-CM

## 2012-09-16 DIAGNOSIS — I6350 Cerebral infarction due to unspecified occlusion or stenosis of unspecified cerebral artery: Secondary | ICD-10-CM

## 2012-09-16 NOTE — Patient Instructions (Addendum)
Your physician recommends that you schedule a follow-up appointment in: 3 months with Tereso Newcomer, PA-C Your physician wants you to follow-up in: 6 months with Dr Graciela Husbands. You will receive a reminder letter in the mail two months in advance. If you don't receive a letter, please call our office to schedule the follow-up appointment.

## 2012-09-16 NOTE — Progress Notes (Signed)
ELECTROPHYSIOLOGY CONSULT NOTE  Patient ID: Linda [name], MRN: [id number], DOB/AGE: [birth year] 72 y.o. Admit date: (Not on file) Date of Consult: 09/16/2012  Primary Physician: Nicki Reaper, NP Primary Cardiologist: DB  Chief Complaint:  afib   HPI Linda [name] is a 72 y.o. female  seen for management of atrial fibrillation. She's the knees 1 with a history of hypertension who presented with a left pontine stroke 4/12. She was found to have atrial fibrillation. She was started on propafenone. She was lost to followup. She has not been on anticoagulation until October/13.  She recalls being hospitalized for a stroke.  She is intermittent tachypalpitations occurred a couple of times a month but lasting only 10-20 seconds. Occasionally they awaken her at night. She associates them with eating too much salt  She also describes an epigastric burning is not acutely related to exertion. She also has a right-sided chest discomfort also not related to exertion. Neither associated with other epiphenomena.  She has occasional peripheral edema but no orthopnea. She's .had no syncope     Past Medical History  Diagnosis Date  . Hypertension   . Hyperlipidemia   . Tobacco abuse     Ongoing (3-6 cigarettes per day, 25 pack year history)  . History of CVA (cerebrovascular accident)     New diagnosis December 08, 2010  . Atrial fibrillation     dx in setting of stroke 4/12;  echo with EF 60-65%, mild LVH, mild AI, grade 1 diast dysfxn  . Impaired glucose tolerance 05/08/2011  . Hx of cardiovascular stress test     a. Myoview 8/12:  EF 69%, no ischemia.       Surgical History: No past surgical history on file.   Home Meds: Prior to Admission medications   Medication Sig Start Date End Date Taking? Authorizing Provider  amLODipine (NORVASC) 10 MG tablet Take 1 tablet (10 mg total) by mouth daily. 07/16/12  Yes Beatrice Lecher, PA  atorvastatin (LIPITOR) 10 MG tablet Take 10 mg by mouth daily.    Yes Historical Provider, MD  warfarin (COUMADIN) 5 MG tablet Take as directed by Anticoagulation clinic 08/13/12  Yes Dolores Patty, MD      Allergies: No Known Allergies  History   Social History  . Marital Status: Widowed    Spouse Name: N/A    Number of Children: N/A  . Years of Education: N/A   Occupational History  .  [employer]  . Not working    Social History Main Topics  . Smoking status: Light Tobacco Smoker -- 1.0 packs/day for 25 years    Types: Cigarettes  . Smokeless tobacco: Not on file     Comment: 3-6 cigarettes per day  . Alcohol Use: No  . Drug Use: No  . Sexually Active: Not on file   Other Topics Concern  . Not on file   Social History Narrative   Lives in [place] with her daughtersRegular dietNo regular exercise but active and independent with all daily activities     Family History  Problem Relation Age of Onset  . Coronary artery disease Neg Hx      ROS:  Please see the history of present illness.   All other systems reviewed and negative.    Physical Exam:  Blood pressure 138/71, pulse 58, height 5' (1.524 m), weight 122 lb (55.339 kg), SpO2 99.00%. General: Well developed, well nourished female in no acute distress. Head: Normocephalic, atraumatic, sclera non-icteric, no  xanthomas, nares are without discharge. Lymph Nodes:  none Back: without scoliosis/kyphosis , no CVA tendersness Neck: Negative for carotid bruits. JVD not elevated. Lungs: Clear bilaterally to auscultation without wheezes, rales, or rhonchi. Breathing is unlabored. Heart: RRR with S1 S2. No murmur , rubs, or gallops appreciated. Abdomen: Soft, non-tender, non-distended with normoactive bowel sounds. No hepatomegaly. No rebound/guarding. No obvious abdominal masses. Msk:  Strength and tone appear normal for age. Extremities: No clubbing or cyanosis. No edema.  Distal pedal pulses are 2+ and equal bilaterally. Skin: Warm and Dry Neuro: Alert and oriented X 3.  CN III-XII intact Grossly normal sensory and motor function . Psych:  Responds to questions appropriately with a normal affect.      Labs: Cardiac Enzymes No results found for this basename: CKTOTAL:4,CKMB:4,TROPONINI:4 in the last 72 hours CBC Lab Results  Component Value Date   WBC 11.1* 08/07/2012   HGB 15.6* 08/07/2012   HCT 46.5* 08/07/2012   MCV 86.6 08/07/2012   PLT 311.0 08/07/2012   PROTIME:  Basename 09/16/12 0947  LABPROT --  INR 2.5   Chemistry No results found for this basename: NA,K,CL,CO2,BUN,CREATININE,CALCIUM,LABALBU,PROT,BILITOT,ALKPHOS,ALT,AST,GLUCOSE in the last 168 hours Lipids Lab Results  Component Value Date   CHOL 201* 08/07/2012   HDL 41.00 08/07/2012   LDLCALC 103* 05/08/2011   TRIG 287.0* 08/07/2012   BNP No results found for this basename: probnp   Miscellaneous No results found for this basename: DDIMER    Radiology/Studies:  No results found.  EKG:  Sinus rhythm at 58 Intervals 18[birth year]09/46 and 9 axis LIV Otherwise normal  Sherryl Manges

## 2012-09-16 NOTE — Assessment & Plan Note (Signed)
She has episodes of palpitations. She has documented atrial fibrillation and a prior stroke. We spent a fair amount of time discussing, through an interpreter, the importance of anticoagulation and its potential and the likelihood of recurrent stroke. She expresses understanding.

## 2012-09-16 NOTE — Assessment & Plan Note (Addendum)
She has chest pain with typical and atypical features.  She had a negative Myoview about 18 months ago. Chest pain syndromes are stable

## 2012-09-16 NOTE — Assessment & Plan Note (Signed)
We again discussed the importance of stopping smoking

## 2012-10-14 ENCOUNTER — Ambulatory Visit (INDEPENDENT_AMBULATORY_CARE_PROVIDER_SITE_OTHER): Payer: Medicare Other | Admitting: *Deleted

## 2012-10-14 DIAGNOSIS — I6350 Cerebral infarction due to unspecified occlusion or stenosis of unspecified cerebral artery: Secondary | ICD-10-CM

## 2012-10-14 DIAGNOSIS — I4891 Unspecified atrial fibrillation: Secondary | ICD-10-CM

## 2012-10-14 DIAGNOSIS — I639 Cerebral infarction, unspecified: Secondary | ICD-10-CM

## 2012-10-21 ENCOUNTER — Other Ambulatory Visit: Payer: Medicare Other

## 2012-11-11 ENCOUNTER — Ambulatory Visit (INDEPENDENT_AMBULATORY_CARE_PROVIDER_SITE_OTHER): Payer: Medicare Other | Admitting: *Deleted

## 2012-11-11 DIAGNOSIS — I6350 Cerebral infarction due to unspecified occlusion or stenosis of unspecified cerebral artery: Secondary | ICD-10-CM

## 2012-11-11 DIAGNOSIS — I4891 Unspecified atrial fibrillation: Secondary | ICD-10-CM

## 2012-11-11 DIAGNOSIS — I639 Cerebral infarction, unspecified: Secondary | ICD-10-CM

## 2012-11-11 LAB — POCT INR: INR: 2.8

## 2012-12-16 ENCOUNTER — Encounter: Payer: Self-pay | Admitting: Physician Assistant

## 2012-12-16 ENCOUNTER — Ambulatory Visit (INDEPENDENT_AMBULATORY_CARE_PROVIDER_SITE_OTHER): Payer: Medicare Other | Admitting: Physician Assistant

## 2012-12-16 ENCOUNTER — Ambulatory Visit (INDEPENDENT_AMBULATORY_CARE_PROVIDER_SITE_OTHER): Payer: Medicare Other | Admitting: *Deleted

## 2012-12-16 VITALS — BP 121/70 | Ht 60.0 in | Wt 123.0 lb

## 2012-12-16 DIAGNOSIS — I4891 Unspecified atrial fibrillation: Secondary | ICD-10-CM

## 2012-12-16 DIAGNOSIS — I6350 Cerebral infarction due to unspecified occlusion or stenosis of unspecified cerebral artery: Secondary | ICD-10-CM

## 2012-12-16 DIAGNOSIS — I639 Cerebral infarction, unspecified: Secondary | ICD-10-CM

## 2012-12-16 DIAGNOSIS — I10 Essential (primary) hypertension: Secondary | ICD-10-CM

## 2012-12-16 DIAGNOSIS — E785 Hyperlipidemia, unspecified: Secondary | ICD-10-CM

## 2012-12-16 LAB — POCT INR: INR: 2

## 2012-12-16 NOTE — Progress Notes (Signed)
[address] [place], [place]  [address] Phone: [phone] Fax:  [phone]  Date:  12/16/2012   ID:  Charlcie, Prisco 07-Nov-1940, MRN [id number]  PCP:  Nicki Reaper, NP  Primary Cardiologist:  Dr. Sherryl Manges     History of Present Illness: Linda [name] is a 72 y.o. female [place] female with a h/o HTN, HL and tobacco abuse who was diagnosed with paroxysmal atrial fibrillation/flutter during a hospitalization in 11/2010 for a left pontine CVA. She had no significant ICA stenosis by carotid dopplers. Echo demonstrated EF 60-65%, mild LVH, grade 1 diast dysfxn, mild AI. Myoview 8/12: EF 69%, no ischemia. Event monitor in 8/12 with high AFib burden. Previously followed by Dr. Arvilla Meres.  In 04/2011, patient was placed on Propafenone SR 225 mg BID. But, she was lost to f/u.  I saw her in 06/2012.  She was placed back on coumadin one month prior to seeing me.  She was last seen in this office by Dr. Sherryl Manges 08/2012.  She is seen today with the assistance of an interpreter.  She is doing well.  No significant change in her chronic chest pain.  No significant dyspnea.  No syncope.  No orthopnea, PND, edema, palpitations.    Labs (4/12):   TSH 1.417  Labs (9/12):   K 3.7, Cr 0.6, ALT 35, LDL 102  Labs (12/13): K 4.1, Cr 0.7, ALT 28, LDL 124.5, Hgb 15.6, A1c 7.1, TSH 1.28   Wt Readings from Last 3 Encounters:  12/16/12 123 lb (55.792 kg)  09/16/12 122 lb (55.339 kg)  08/07/12 125 lb (56.7 kg)     Past Medical History  Diagnosis Date  . Hypertension   . Hyperlipidemia   . Tobacco abuse     Ongoing (3-6 cigarettes per day, 25 pack year history)  . History of CVA (cerebrovascular accident)     New diagnosis December 08, 2010  . Atrial fibrillation     dx in setting of stroke 4/12;  echo with EF 60-65%, mild LVH, mild AI, grade 1 diast dysfxn  . Impaired glucose tolerance 05/08/2011  . Hx of cardiovascular stress test     a. Myoview 8/12:  EF 69%, no  ischemia.     Current Outpatient Prescriptions  Medication Sig Dispense Refill  . amLODipine (NORVASC) 10 MG tablet Take 1 tablet (10 mg total) by mouth daily.  30 tablet  11  . atorvastatin (LIPITOR) 10 MG tablet Take 10 mg by mouth daily.      Marland Kitchen warfarin (COUMADIN) 5 MG tablet Take as directed by Anticoagulation clinic  30 tablet  3   No current facility-administered medications for this visit.    Allergies:   No Known Allergies  Social History:  The patient  reports that she has been smoking Cigarettes.  She has a 25 pack-year smoking history. She does not have any smokeless tobacco history on file. She reports that she does not drink alcohol or use illicit drugs.   ROS:  Please see the history of present illness.   No melena, hematochezia, hematuria.  All other systems reviewed and negative.   PHYSICAL EXAM: VS:  BP 121/70  Ht 5' (1.524 m)  Wt 123 lb (55.792 kg)  BMI 24.02 kg/m2 Well nourished, well developed, in no acute distress HEENT: normal Neck: no JVD Cardiac:  normal S1, S2; RRR; 1-2/6 systolic murmur at RUSB Lungs:  clear to auscultation bilaterally, no wheezing, rhonchi or rales Abd: soft,  nontender, no hepatomegaly Ext: no edema Skin: warm and dry Neuro:  CNs 2-12 intact, no focal abnormalities noted  EKG:  Sinus bradycardia, HR 56     ASSESSMENT AND PLAN:  1. Atrial Fibrillation:  She has been compliant with coumadin and has f/u in CVRR clinic today.  She is maintaining NSR and is not having many symptoms of palpitations.   2. Hypertension:  Controlled.  Continue current therapy.  3. Hyperlipidemia:  Continue statin. 4. Diabetes Mellitus:  Follow up with PCP. 5. Tobacco Abuse:  She has previously been counseled on the need to quit. 6. Disposition:  F/u with Dr. Sherryl Manges as planned.   SignedTereso Newcomer, PA-C  10:06 AM 12/16/2012

## 2012-12-16 NOTE — Patient Instructions (Addendum)
We will send you out a letter to you in 3 months to make an appt to see Dr. Graciela Husbands  Please follow up with Primary Care physician for diabetes  No changes with medications today

## 2013-04-01 ENCOUNTER — Encounter: Payer: Self-pay | Admitting: Internal Medicine

## 2013-04-02 ENCOUNTER — Emergency Department (HOSPITAL_COMMUNITY)
Admission: EM | Admit: 2013-04-02 | Discharge: 2013-04-03 | Disposition: A | Discharge status: Home / Self Care | Payer: Medicare Other | Attending: Emergency Medicine | Admitting: Emergency Medicine

## 2013-04-02 ENCOUNTER — Encounter (HOSPITAL_COMMUNITY): Payer: Self-pay | Admitting: Emergency Medicine

## 2013-04-02 ENCOUNTER — Emergency Department (HOSPITAL_COMMUNITY): Payer: Medicare Other

## 2013-04-02 DIAGNOSIS — I10 Essential (primary) hypertension: Secondary | ICD-10-CM | POA: Insufficient documentation

## 2013-04-02 DIAGNOSIS — I4891 Unspecified atrial fibrillation: Secondary | ICD-10-CM | POA: Insufficient documentation

## 2013-04-02 DIAGNOSIS — M25462 Effusion, left knee: Secondary | ICD-10-CM

## 2013-04-02 DIAGNOSIS — M25469 Effusion, unspecified knee: Secondary | ICD-10-CM | POA: Insufficient documentation

## 2013-04-02 DIAGNOSIS — M25569 Pain in unspecified knee: Secondary | ICD-10-CM | POA: Insufficient documentation

## 2013-04-02 DIAGNOSIS — I2510 Atherosclerotic heart disease of native coronary artery without angina pectoris: Secondary | ICD-10-CM | POA: Insufficient documentation

## 2013-04-02 DIAGNOSIS — R609 Edema, unspecified: Secondary | ICD-10-CM | POA: Insufficient documentation

## 2013-04-02 DIAGNOSIS — E785 Hyperlipidemia, unspecified: Secondary | ICD-10-CM | POA: Insufficient documentation

## 2013-04-02 DIAGNOSIS — Z79899 Other long term (current) drug therapy: Secondary | ICD-10-CM | POA: Insufficient documentation

## 2013-04-02 DIAGNOSIS — Z8673 Personal history of transient ischemic attack (TIA), and cerebral infarction without residual deficits: Secondary | ICD-10-CM | POA: Insufficient documentation

## 2013-04-02 DIAGNOSIS — M25562 Pain in left knee: Secondary | ICD-10-CM

## 2013-04-02 HISTORY — DX: Atherosclerotic heart disease of native coronary artery without angina pectoris: I25.10

## 2013-04-02 LAB — COMPREHENSIVE METABOLIC PANEL
ALT: 19 U/L (ref 0–35)
AST: 17 U/L (ref 0–37)
Alkaline Phosphatase: 89 U/L (ref 39–117)
CO2: 25 mEq/L (ref 19–32)
Chloride: 101 mEq/L (ref 96–112)
GFR calc Af Amer: >90 mL/min (ref >90)
GFR calc non Af Amer: 87 mL/min — ABNORMAL LOW (ref >90)
Glucose, Bld: 107 mg/dL — ABNORMAL HIGH (ref 70–99)
Potassium: 3.5 mEq/L (ref 3.5–5.1)
Sodium: 137 mEq/L (ref 135–145)
Total Bilirubin: 0.3 mg/dL (ref 0.3–1.2)

## 2013-04-02 LAB — CBC WITH DIFFERENTIAL/PLATELET
Basophils Relative: 0 % (ref 0–1)
HCT: 40.7 % (ref 36.0–46.0)
Hemoglobin: 14.5 g/dL (ref 12.0–15.0)
Lymphs Abs: 3.5 10*3/uL (ref 0.7–4.0)
MCHC: 35.6 g/dL (ref 30.0–36.0)
Monocytes Absolute: 0.6 10*3/uL (ref 0.1–1.0)
Monocytes Relative: 5 % (ref 3–12)
Neutro Abs: 6.1 10*3/uL (ref 1.7–7.7)
Neutrophils Relative %: 57 % (ref 43–77)
RBC: 4.82 MIL/uL (ref 3.87–5.11)

## 2013-04-02 LAB — PROTIME-INR: INR: 1.91 — ABNORMAL HIGH (ref 0.00–1.49)

## 2013-04-02 NOTE — ED Notes (Signed)
NURSE FIRST ROUNDS : NURSE EXPLAINED DELAY / WAIT TIME AND PROCESS TO PT. , DENEIS PAIN / RESPIRATIONS UNLABORED .

## 2013-04-02 NOTE — ED Notes (Signed)
Left leg weakness x 2 weeks worse today thigh and knee cap states it has buckled and pain started today ? Stroke s/s

## 2013-04-02 NOTE — ED Notes (Signed)
Grandson with patient and has been translating for the patient. Family speaks [place]

## 2013-04-03 MED ORDER — HYDROCODONE-ACETAMINOPHEN 5-325 MG PO TABS
1.0000 | ORAL_TABLET | ORAL | Status: DC | PRN
  Administered 2013-04-03: 1 via ORAL
  Filled 2013-04-03: qty 1

## 2013-04-03 MED ORDER — HYDROCODONE-ACETAMINOPHEN 5-325 MG PO TABS: 1.0000 | ORAL_TABLET | ORAL | Status: DC | PRN

## 2013-04-03 NOTE — ED Provider Notes (Signed)
CSN: [id number]     Arrival date & time 04/02/13  1816 History     First MD Initiated Contact with Patient 04/03/13 0010     Chief Complaint  Patient presents with  . Leg Pain   (Consider location/radiation/quality/duration/timing/severity/associated sxs/prior Treatment) HPI 72 yo female presents to the ER from home with family with complaint of weakness and "giving way" of left knee intermittently for 2 weeks.  Today had onset of pain and swelling to the knee.  Pain goes from mid thigh down to knee, worse in the posterior portion.  Pt is on coumadin for afib.  No trauma to the knee.  Pt is unsure if she twisted it this morning when it gave way.  No prior ortho evaluation.   Past Medical History  Diagnosis Date  . Hypertension   . Hyperlipidemia   . Tobacco abuse     Ongoing (3-6 cigarettes per day, 25 pack year history)  . History of CVA (cerebrovascular accident)     New diagnosis December 08, 2010  . Atrial fibrillation     dx in setting of stroke 4/12;  echo with EF 60-65%, mild LVH, mild AI, grade 1 diast dysfxn  . Impaired glucose tolerance 05/08/2011  . Hx of cardiovascular stress test     a. Myoview 8/12:  EF 69%, no ischemia.   . Coronary artery disease    History reviewed. No pertinent past surgical history. Family History  Problem Relation Age of Onset  . Coronary artery disease Neg Hx    History  Substance Use Topics  . Smoking status: Light Tobacco Smoker -- 1.00 packs/day for 25 years    Types: Cigarettes  . Smokeless tobacco: Not on file     Comment: 3-6 cigarettes per day  . Alcohol Use: No   OB History   Grav Para Term Preterm Abortions TAB SAB Ect Mult Living                 Review of Systems  Unable to perform ROS: Other  pt does not speak english, translation done by outside source  Allergies  Review of patient's allergies indicates no known allergies.  Home Medications   Current Outpatient Rx  Name  Route  Sig  Dispense  Refill  . amLODipine  (NORVASC) 10 MG tablet   Oral   Take 10 mg by mouth daily.         Marland Kitchen atorvastatin (LIPITOR) 10 MG tablet   Oral   Take 10 mg by mouth daily.         . clobetasol cream (TEMOVATE) 0.05 %   Topical   Apply 1 application topically 3 (three) times daily.         Marland Kitchen warfarin (COUMADIN) 5 MG tablet   Oral   Take 5 mg by mouth daily.         Marland Kitchen HYDROcodone-acetaminophen (NORCO/VICODIN) 5-325 MG per tablet   Oral   Take 1 tablet by mouth every 4 (four) hours as needed.   30 tablet   0    BP 130/51  Pulse 55  Temp(Src) 98.1 F (36.7 C) (Oral)  Resp 18  SpO2 99% Physical Exam  Nursing note and vitals reviewed. Constitutional: She appears well-developed and well-nourished. She appears distressed.  Cardiovascular: Regular rhythm, normal heart sounds and intact distal pulses.  Exam reveals no gallop and no friction rub.   Musculoskeletal: She exhibits edema and tenderness.  Left knee with small effusion noted.  No overlying bruising.  No deformity.  No pain over medial or lateral joint line.  Neg ant/post drawer.  Normal varus/valgus stress.  Pain with palpation over patella, posterior fossa and anterior thigh  Neurological: She displays normal reflexes. She exhibits normal muscle tone. Coordination normal.  Skin: Skin is warm and dry. No rash noted. No erythema. No pallor.    ED Course   Procedures (including critical care time)  Labs Reviewed  CBC WITH DIFFERENTIAL - Abnormal; Notable for the following:    WBC 10.6 (*)    All other components within normal limits  PROTIME-INR - Abnormal; Notable for the following:    Prothrombin Time 21.3 (*)    INR 1.91 (*)    All other components within normal limits  COMPREHENSIVE METABOLIC PANEL - Abnormal; Notable for the following:    Glucose, Bld 107 (*)    GFR calc non Af Amer 87 (*)    All other components within normal limits   Dg Hip Complete Left  04/03/2013   *RADIOLOGY REPORT*  Clinical Data: Left leg pain  LEFT HIP -  COMPLETE 2+ VIEW  Comparison: None.  Findings: No displaced pelvic fracture.  No left hip fracture or dislocation.  No radiopaque foreign body.  No soft tissue abnormality.  IMPRESSION: No left hip fracture.   Original Report Authenticated By: Christiana Pellant, M.D.   Dg Knee Complete 4 Views Left  04/03/2013   *RADIOLOGY REPORT*  Clinical Data: Leg pain  LEFT KNEE - COMPLETE 4+ VIEW  Comparison: None  Findings: Mild tricompartmental degenerative change noted.  No fracture or dislocation.  Small suprapatellar effusion noted.  No radiopaque foreign body.  IMPRESSION: Mild tricompartmental degenerative change.   Original Report Authenticated By: Christiana Pellant, M.D.   1. Knee pain, left   2. Effusion of left knee     MDM  72 yo female with left knee pain.  Degenerative changes on xray, effusion noted.  Do not feel sxs are due to dvt.  More likely acute knee sprain on chronic knee OA.  Will refer to ortho, treat pain.  Olivia Mackie, MD 04/03/13 [phone]

## 2013-05-01 ENCOUNTER — Telehealth (HOSPITAL_COMMUNITY): Payer: Self-pay | Admitting: Cardiology

## 2013-05-01 NOTE — Telephone Encounter (Signed)
During home visit NP noticed pt is on coumadin and has not had a pt/inr level drawn in quite some time. Pt was recently d/c s/p stroke a few weeks ago.  Chart reviewed with Karie Mainland [place],NP Pt NO SHOWED- INR APPT 6/9 No record of hospitalization/office visit within the past 2-3 weeks However pt will need to re-establish with [facility] to follow INR  Cindy aware and advised to call pt grandson Stevenson Clinch [phone] to arrange follow up

## 2013-05-07 ENCOUNTER — Encounter: Payer: Self-pay | Admitting: Internal Medicine

## 2013-05-12 ENCOUNTER — Telehealth: Payer: Self-pay | Admitting: *Deleted

## 2013-05-12 NOTE — Telephone Encounter (Signed)
Patient needs coumadin clinic appointment for warfarin refill received from [facility], [place]. See note from 05/01/2013 12/16/2012 was last coumadin clinic appt.

## 2013-05-12 NOTE — Telephone Encounter (Signed)
Received call back from [place] from [facility], she has not been in contact with family member, of course there is a language barrier, she states to try to contact grandson which I have left a message for him.

## 2013-05-13 MED ORDER — WARFARIN SODIUM 5 MG PO TABS: ORAL_TABLET | ORAL | Status: DC

## 2013-05-13 NOTE — Telephone Encounter (Signed)
Still unable to contact patients family to discuss warfaring refill, need for INR check, they havent called [name], will notify Dr Gala Romney of efforts we are doing for this patient safe coumadin management.

## 2013-05-13 NOTE — Telephone Encounter (Signed)
Left message and made appt for patient to have her INR checked when she comes for appt to see Dr Graciela Husbands in November, will order refill on warfarin

## 2013-06-10 ENCOUNTER — Other Ambulatory Visit: Payer: Self-pay | Admitting: Physician Assistant

## 2013-07-02 ENCOUNTER — Ambulatory Visit: Payer: Medicare Other | Admitting: Internal Medicine

## 2013-07-08 ENCOUNTER — Encounter: Payer: Self-pay | Admitting: Internal Medicine

## 2013-07-08 ENCOUNTER — Ambulatory Visit (INDEPENDENT_AMBULATORY_CARE_PROVIDER_SITE_OTHER): Payer: Medicare Other | Admitting: Internal Medicine

## 2013-07-08 VITALS — BP 145/72 | HR 61 | Ht 60.0 in | Wt 119.8 lb

## 2013-07-08 DIAGNOSIS — R002 Palpitations: Secondary | ICD-10-CM

## 2013-07-08 DIAGNOSIS — I10 Essential (primary) hypertension: Secondary | ICD-10-CM

## 2013-07-08 DIAGNOSIS — E785 Hyperlipidemia, unspecified: Secondary | ICD-10-CM

## 2013-07-08 DIAGNOSIS — I4891 Unspecified atrial fibrillation: Secondary | ICD-10-CM

## 2013-07-08 NOTE — Assessment & Plan Note (Signed)
BP stable.

## 2013-07-08 NOTE — Assessment & Plan Note (Addendum)
It sounds like she is having paroxysms of atrial fibrillation. Given the fact that she has modest bradycardia adjusting medications empirically is a little bit challenging. Hence, we'll undertake an event recorder to make sure that atrial fibrillation is in fact the issue.  We reviewed the importance of anticoagulation. This was all discussed through interpreters.

## 2013-07-08 NOTE — Progress Notes (Signed)
      Patient Care Team: Nicki Reaper, NP as PCP - General (Internal Medicine)   HPI  Linda [name] is a 72 y.o. female Seen in followup for atrial fibrillation with a history of prior stroke. She has been treated with propafenone. We spent a long time at her last visit discussing the importance of anticoagulation. She has been on Coumadin.  I last saw her about one year ago. She has a history of a negative Myoview 2012 echocardiogram 4/12 demonstrated EF of 60-65%. Myoview 8/12 no ischemia.  She has spells 2-3 times per week/month where  with palpitations she has dizziness and or shortness of breath. These do not occur in the absence of palpitations.     Past Medical History  Diagnosis Date  . Hypertension   . Hyperlipidemia   . Tobacco abuse     Ongoing (3-6 cigarettes per day, 25 pack year history)  . History of CVA (cerebrovascular accident)     New diagnosis December 08, 2010  . Atrial fibrillation     dx in setting of stroke 4/12;  echo with EF 60-65%, mild LVH, mild AI, grade 1 diast dysfxn  . Impaired glucose tolerance 05/08/2011  . Hx of cardiovascular stress test     a. Myoview 8/12:  EF 69%, no ischemia.   . Coronary artery disease     No past surgical history on file.  Current Outpatient Prescriptions  Medication Sig Dispense Refill  . amLODipine (NORVASC) 10 MG tablet TAKE ONE TABLET BY MOUTH DAILY  30 tablet  0  . atorvastatin (LIPITOR) 10 MG tablet Take 10 mg by mouth daily.      . clobetasol cream (TEMOVATE) 0.05 % Apply 1 application topically 3 (three) times daily.      Marland Kitchen HYDROcodone-acetaminophen (NORCO/VICODIN) 5-325 MG per tablet Take 1 tablet by mouth every 4 (four) hours as needed.  30 tablet  0  . warfarin (COUMADIN) 5 MG tablet Take as directed by couamdin clinic will check INR when patient comes for appointment with cardiologist in November ( Dr Graciela Husbands)  30 tablet  0   No current facility-administered medications for this visit.    No Known  Allergies  Review of Systems negative except from HPI and PMH  Physical Exam BP 145/72  Pulse 61  Ht 5' (1.524 m)  Wt 119 lb 12.8 oz (54.341 kg)  BMI 23.40 kg/m2 Well developed and well nourished in no acute distress HENT normal E scleral and icterus clear Neck Supple JVP flat; carotids brisk and full Clear to ausculation Regular rate and rhythm, no murmurs gallops or rub Soft with active bowel sounds No clubbing cyanosis none Edema Alert and oriented, grossly normal motor and sensory function Skin Warm and Dry    Assessment and  Plan

## 2013-07-08 NOTE — Patient Instructions (Signed)
Your physician recommends that you continue on your current medications as directed. Please refer to the Current Medication list given to you today.  Your physician has recommended that you wear an event monitor. Event monitors are medical devices that record the heart's electrical activity. Doctors most often [name] these monitors to diagnose arrhythmias. Arrhythmias are problems with the speed or rhythm of the heartbeat. The monitor is a small, portable device. You can wear one while you do your normal daily activities. This is usually used to diagnose what is causing palpitations/syncope (passing out).  Your physician recommends that you schedule a follow-up appointment in: 2 months with Dr. Graciela Husbands (after event monitor completed)

## 2013-07-22 ENCOUNTER — Encounter: Payer: Self-pay | Admitting: *Deleted

## 2013-07-22 ENCOUNTER — Encounter (INDEPENDENT_AMBULATORY_CARE_PROVIDER_SITE_OTHER): Payer: Medicare Other

## 2013-07-22 DIAGNOSIS — R002 Palpitations: Secondary | ICD-10-CM

## 2013-07-22 NOTE — Progress Notes (Signed)
Patient ID: Linda [name], female   DOB: 07-Nov-1940, 72 y.o.   MRN: [id number] E-Cardiac verite 30 day cardiac event monitor applied to patient.

## 2013-07-24 ENCOUNTER — Ambulatory Visit (INDEPENDENT_AMBULATORY_CARE_PROVIDER_SITE_OTHER): Payer: Medicare Other | Admitting: General Practice

## 2013-07-24 DIAGNOSIS — I4891 Unspecified atrial fibrillation: Secondary | ICD-10-CM

## 2013-07-24 DIAGNOSIS — I6350 Cerebral infarction due to unspecified occlusion or stenosis of unspecified cerebral artery: Secondary | ICD-10-CM

## 2013-07-24 DIAGNOSIS — I639 Cerebral infarction, unspecified: Secondary | ICD-10-CM

## 2013-08-09 ENCOUNTER — Other Ambulatory Visit: Payer: Self-pay | Admitting: Internal Medicine

## 2013-08-09 ENCOUNTER — Other Ambulatory Visit: Payer: Self-pay | Admitting: Physician Assistant

## 2013-08-19 ENCOUNTER — Other Ambulatory Visit: Payer: Self-pay | Admitting: Physician Assistant

## 2013-08-27 ENCOUNTER — Ambulatory Visit (INDEPENDENT_AMBULATORY_CARE_PROVIDER_SITE_OTHER): Payer: Medicare Other | Admitting: Internal Medicine

## 2013-08-27 ENCOUNTER — Ambulatory Visit (INDEPENDENT_AMBULATORY_CARE_PROVIDER_SITE_OTHER): Payer: Medicare Other | Admitting: Pharmacist

## 2013-08-27 ENCOUNTER — Encounter (INDEPENDENT_AMBULATORY_CARE_PROVIDER_SITE_OTHER): Payer: Self-pay

## 2013-08-27 ENCOUNTER — Encounter: Payer: Self-pay | Admitting: Internal Medicine

## 2013-08-27 VITALS — BP 138/80 | HR 63 | Ht 62.0 in | Wt 118.0 lb

## 2013-08-27 DIAGNOSIS — I4891 Unspecified atrial fibrillation: Secondary | ICD-10-CM

## 2013-08-27 DIAGNOSIS — I5189 Other ill-defined heart diseases: Secondary | ICD-10-CM

## 2013-08-27 DIAGNOSIS — R109 Unspecified abdominal pain: Secondary | ICD-10-CM

## 2013-08-27 DIAGNOSIS — I519 Heart disease, unspecified: Secondary | ICD-10-CM

## 2013-08-27 DIAGNOSIS — I6350 Cerebral infarction due to unspecified occlusion or stenosis of unspecified cerebral artery: Secondary | ICD-10-CM

## 2013-08-27 DIAGNOSIS — R10A2 Flank pain, left side: Secondary | ICD-10-CM | POA: Insufficient documentation

## 2013-08-27 DIAGNOSIS — I639 Cerebral infarction, unspecified: Secondary | ICD-10-CM

## 2013-08-27 DIAGNOSIS — I10 Essential (primary) hypertension: Secondary | ICD-10-CM

## 2013-08-27 LAB — POCT INR: INR: 2.1

## 2013-08-27 NOTE — Assessment & Plan Note (Signed)
Seemed to be related to superficial tissues muscle and fat. To follow w PCP

## 2013-08-27 NOTE — Assessment & Plan Note (Signed)
Symptomatically without arrhythmias; however, event recorder clarify recurrent atrial fibrillation associated with a reasonable ventricular response

## 2013-08-27 NOTE — Assessment & Plan Note (Signed)
Well controlled 

## 2013-08-27 NOTE — Patient Instructions (Signed)
Your physician recommends that you continue on your current medications as directed. Please refer to the Current Medication list given to you today.  Your physician wants you to follow-up in: 6 months with Dr. Klein. You will receive a reminder letter in the mail two months in advance. If you don't receive a letter, please call our office to schedule the follow-up appointment.  

## 2013-08-27 NOTE — Progress Notes (Signed)
      Patient Care Team: Nicki Reaperegina Baity, NP as PCP - General (Internal Medicine)   HPI  Linda [name] is a 73 y.o. female Seen in followup for atrial fibrillation and prior stroke. She was previously treated with propafenone but no longer. She is on anticoagulation with Coumadin.  Through an interpreter she denies chest pain or shortness of breath. She does have left flank pain. She denies dysuria Past Medical History  Diagnosis Date  . Hypertension   . Hyperlipidemia   . Tobacco abuse     Ongoing (3-6 cigarettes per day, 25 pack year history)  . History of CVA (cerebrovascular accident)     New diagnosis December 08, 2010  . Atrial fibrillation     dx in setting of stroke 4/12;  echo with EF 60-65%, mild LVH, mild AI, grade 1 diast dysfxn  . Impaired glucose tolerance 05/08/2011  . Hx of cardiovascular stress test     a. Myoview 8/12:  EF 69%, no ischemia.   . Coronary artery disease     No past surgical history on file.  Current Outpatient Prescriptions  Medication Sig Dispense Refill  . amLODipine (NORVASC) 10 MG tablet TAKE 1 TABLET BY MOUTH DAILY  30 tablet  3  . atorvastatin (LIPITOR) 20 MG tablet TAKE 1 TABLET BY MOUTH DAILY  30 tablet  6  . clobetasol cream (TEMOVATE) 0.05 % Apply 1 application topically 3 (three) times daily.      Marland Kitchen. lisinopril (PRINIVIL,ZESTRIL) 10 MG tablet TAKE 1 TABLET BY MOUTH ONCE DAILY  30 tablet  0  . warfarin (COUMADIN) 5 MG tablet Take as directed by couamdin clinic will check INR when patient comes for appointment with cardiologist in November ( Dr Graciela HusbandsKlein)  30 tablet  0   No current facility-administered medications for this visit.    No Known Allergies  Review of Systems negative except from HPI and PMH  Physical Exam BP 138/80  Pulse 63  Ht 5\' 2"  (1.575 m)  Wt 118 lb (53.524 kg)  BMI 21.58 kg/m2 Well developed and well nourished in no acute distress HENT normal E scleral and icterus clear Neck Supple JVP flat; carotids brisk and  full Clear to ausculation Regular rate and rhythm   Soft with  mild left flank tenderness seems to be in the muscle bellies  No clubbing cyanosis  No edema Alert and oriented, grossly normal motor and sensory function Skin Warm and Dry    Assessment and  Plan

## 2013-08-27 NOTE — Assessment & Plan Note (Signed)
Euvolemic. 

## 2013-10-11 ENCOUNTER — Other Ambulatory Visit: Payer: Self-pay | Admitting: Internal Medicine

## 2013-12-11 ENCOUNTER — Other Ambulatory Visit: Payer: Self-pay | Admitting: Internal Medicine

## 2014-02-10 ENCOUNTER — Other Ambulatory Visit: Payer: Self-pay | Admitting: Internal Medicine

## 2014-03-11 ENCOUNTER — Other Ambulatory Visit: Payer: Self-pay | Admitting: Internal Medicine

## 2014-03-25 ENCOUNTER — Encounter: Payer: Self-pay | Admitting: Internal Medicine

## 2014-03-25 ENCOUNTER — Ambulatory Visit (INDEPENDENT_AMBULATORY_CARE_PROVIDER_SITE_OTHER): Payer: Medicare Other | Admitting: Internal Medicine

## 2014-03-25 VITALS — BP 142/79 | HR 62 | Ht 62.0 in | Wt 120.0 lb

## 2014-03-25 DIAGNOSIS — I4891 Unspecified atrial fibrillation: Secondary | ICD-10-CM

## 2014-03-25 MED ORDER — WARFARIN SODIUM 5 MG PO TABS: ORAL_TABLET | ORAL | Status: DC

## 2014-03-25 NOTE — Patient Instructions (Addendum)
Your physician recommends that you continue on your current medications as directed. Please refer to the Current Medication list given to you today.  Your physician wants you to follow-up in: 1 year with Dr. Graciela HusbandsKlein.  You will receive a reminder letter in the mail two months in advance. If you don't receive a letter, please call our office to schedule the follow-up appointment.  [phone]-[email or web address]  [phone]( [phone] )

## 2014-03-25 NOTE — Progress Notes (Signed)
      Patient Care Team: Nicki Reaperegina Baity, NP as PCP - General (Internal Medicine)   HPI  Linda PineDoan T [name] is a 73 y.o. female Seen in followup for atrial fibrillation and prior stroke. She was previously treated with propafenone but no longer. She is on anticoagulation with Coumadin. She complains of dyspnea. This is present even now at rest. It is not aggravated by exertion. She does have recumbent dyspnea i.e. at night. She does have peripheral edema.  She denies chest discomfort.  The last few months she was seen by physician and seen on gated chest x-rays, stool samples and urine samples etc. She was told everything was normal.   Past Medical History  Diagnosis Date  . Hypertension   . Hyperlipidemia   . Tobacco abuse     Ongoing (3-6 cigarettes per day, 25 pack year history)  . History of CVA (cerebrovascular accident)     New diagnosis December 08, 2010  . Atrial fibrillation     dx in setting of stroke 4/12;  echo with EF 60-65%, mild LVH, mild AI, grade 1 diast dysfxn  . Impaired glucose tolerance 05/08/2011  . Hx of cardiovascular stress test     a. Myoview 8/12:  EF 69%, no ischemia.   . Coronary artery disease     No past surgical history on file.  Current Outpatient Prescriptions  Medication Sig Dispense Refill  . amLODipine (NORVASC) 10 MG tablet TAKE 1 TABLET BY MOUTH EVERY DAY  30 tablet  5  . atorvastatin (LIPITOR) 20 MG tablet TAKE 1 TABLET BY MOUTH EVERY DAY  30 tablet  5  . clobetasol cream (TEMOVATE) 0.05 % Apply 1 application topically 3 (three) times daily.      Marland Kitchen. lisinopril (PRINIVIL,ZESTRIL) 10 MG tablet TAKE 1 TABLET BY MOUTH ONCE DAILY  30 tablet  0  . warfarin (COUMADIN) 5 MG tablet TAKE AS DIRECTED BY COUMADIN CLINIC  15 tablet  0   No current facility-administered medications for this visit.    No Known Allergies  Review of Systems negative except from HPI and PMH  Physical Exam BP 142/79  Pulse 62  Ht 5\' 2"  (1.575 m)  Wt 120 lb (54.432 kg)   BMI 21.94 kg/m2 Well developed and well nourished in no acute distress HENT normal E scleral and icterus clear Neck Supple JVP flat; carotids brisk and full Clear to ausculation Regular rate and rhythm   Soft with  mild left flank tenderness seems to be in the muscle bellies  No clubbing cyanosis  No edema Alert and oriented, grossly normal motor and sensory function Skin Warm and Dry  ECG demonstrates sinus rhythm  Assessment and  Plan  Dyspnea  Atrial fibrillation-paroxysmal  Hypertension  Tobacco abuse  Prior stroke  Rhythm is stable. She is on warfarin. Blood pressure is reasonably controlled.  I don't have an explanation for her dyspnea. Infiltrate O2 sat monitoring in the office demonstrated no change in sats  I have told her I don't have an explanation for her dyspnea and we'll be glad to refer her to pulmonary. She is not sure that she can arrange transportation.  She desires to stop smoking  Gave her info for 1800quitnow and free patches

## 2014-05-11 ENCOUNTER — Other Ambulatory Visit: Payer: Self-pay | Admitting: Internal Medicine

## 2014-09-10 ENCOUNTER — Other Ambulatory Visit: Payer: Self-pay | Admitting: Internal Medicine

## 2014-09-10 NOTE — Telephone Encounter (Signed)
You are correct.  Patient should contact ordering physician.

## 2014-09-10 NOTE — Telephone Encounter (Signed)
Ok to refill these? I do not see where Dr Graciela HusbandsKlein manages her lipids. Please advise. Thanks, MI

## 2014-11-17 ENCOUNTER — Telehealth: Payer: Self-pay

## 2014-11-17 NOTE — Telephone Encounter (Signed)
Pt granddaughter will ck about flu vaccine and call office back with flu vaccine info

## 2016-01-24 ENCOUNTER — Emergency Department (HOSPITAL_COMMUNITY): Payer: Medicare HMO

## 2016-01-24 ENCOUNTER — Encounter (HOSPITAL_COMMUNITY): Payer: Self-pay | Admitting: *Deleted

## 2016-01-24 ENCOUNTER — Ambulatory Visit (INDEPENDENT_AMBULATORY_CARE_PROVIDER_SITE_OTHER): Payer: Medicare HMO | Admitting: Physician Assistant

## 2016-01-24 ENCOUNTER — Inpatient Hospital Stay (HOSPITAL_COMMUNITY)
Admission: EM | Admit: 2016-01-24 | Discharge: 2016-02-03 | DRG: 871 | Disposition: A | Discharge status: Home / Self Care | Payer: Medicare HMO | Attending: Internal Medicine | Admitting: Internal Medicine

## 2016-01-24 VITALS — BP 171/110 | HR 74 | Resp 18

## 2016-01-24 DIAGNOSIS — R55 Syncope and collapse: Secondary | ICD-10-CM

## 2016-01-24 DIAGNOSIS — D62 Acute posthemorrhagic anemia: Secondary | ICD-10-CM | POA: Diagnosis not present

## 2016-01-24 DIAGNOSIS — N7011 Chronic salpingitis: Secondary | ICD-10-CM | POA: Diagnosis present

## 2016-01-24 DIAGNOSIS — E875 Hyperkalemia: Secondary | ICD-10-CM | POA: Diagnosis not present

## 2016-01-24 DIAGNOSIS — K353 Acute appendicitis with localized peritonitis: Secondary | ICD-10-CM | POA: Diagnosis not present

## 2016-01-24 DIAGNOSIS — I480 Paroxysmal atrial fibrillation: Secondary | ICD-10-CM | POA: Diagnosis not present

## 2016-01-24 DIAGNOSIS — R933 Abnormal findings on diagnostic imaging of other parts of digestive tract: Secondary | ICD-10-CM | POA: Diagnosis not present

## 2016-01-24 DIAGNOSIS — E278 Other specified disorders of adrenal gland: Secondary | ICD-10-CM | POA: Diagnosis not present

## 2016-01-24 DIAGNOSIS — R1030 Lower abdominal pain, unspecified: Secondary | ICD-10-CM | POA: Diagnosis present

## 2016-01-24 DIAGNOSIS — K6389 Other specified diseases of intestine: Secondary | ICD-10-CM

## 2016-01-24 DIAGNOSIS — Z79899 Other long term (current) drug therapy: Secondary | ICD-10-CM

## 2016-01-24 DIAGNOSIS — K36 Other appendicitis: Secondary | ICD-10-CM | POA: Diagnosis not present

## 2016-01-24 DIAGNOSIS — R10819 Abdominal tenderness, unspecified site: Secondary | ICD-10-CM | POA: Diagnosis not present

## 2016-01-24 DIAGNOSIS — J449 Chronic obstructive pulmonary disease, unspecified: Secondary | ICD-10-CM | POA: Diagnosis present

## 2016-01-24 DIAGNOSIS — I2510 Atherosclerotic heart disease of native coronary artery without angina pectoris: Secondary | ICD-10-CM | POA: Diagnosis present

## 2016-01-24 DIAGNOSIS — F17210 Nicotine dependence, cigarettes, uncomplicated: Secondary | ICD-10-CM | POA: Diagnosis present

## 2016-01-24 DIAGNOSIS — N83202 Unspecified ovarian cyst, left side: Secondary | ICD-10-CM | POA: Diagnosis not present

## 2016-01-24 DIAGNOSIS — K567 Ileus, unspecified: Secondary | ICD-10-CM | POA: Diagnosis not present

## 2016-01-24 DIAGNOSIS — K37 Unspecified appendicitis: Secondary | ICD-10-CM | POA: Diagnosis not present

## 2016-01-24 DIAGNOSIS — K8020 Calculus of gallbladder without cholecystitis without obstruction: Secondary | ICD-10-CM | POA: Diagnosis present

## 2016-01-24 DIAGNOSIS — E872 Acidosis: Secondary | ICD-10-CM | POA: Diagnosis not present

## 2016-01-24 DIAGNOSIS — I10 Essential (primary) hypertension: Secondary | ICD-10-CM | POA: Diagnosis not present

## 2016-01-24 DIAGNOSIS — I5032 Chronic diastolic (congestive) heart failure: Secondary | ICD-10-CM | POA: Diagnosis present

## 2016-01-24 DIAGNOSIS — Z9119 Patient's noncompliance with other medical treatment and regimen: Secondary | ICD-10-CM | POA: Diagnosis not present

## 2016-01-24 DIAGNOSIS — Z8673 Personal history of transient ischemic attack (TIA), and cerebral infarction without residual deficits: Secondary | ICD-10-CM

## 2016-01-24 DIAGNOSIS — R1084 Generalized abdominal pain: Secondary | ICD-10-CM | POA: Diagnosis not present

## 2016-01-24 DIAGNOSIS — N83201 Unspecified ovarian cyst, right side: Secondary | ICD-10-CM | POA: Diagnosis not present

## 2016-01-24 DIAGNOSIS — A419 Sepsis, unspecified organism: Secondary | ICD-10-CM | POA: Diagnosis present

## 2016-01-24 DIAGNOSIS — F17200 Nicotine dependence, unspecified, uncomplicated: Secondary | ICD-10-CM | POA: Diagnosis present

## 2016-01-24 DIAGNOSIS — R197 Diarrhea, unspecified: Secondary | ICD-10-CM | POA: Diagnosis not present

## 2016-01-24 DIAGNOSIS — R42 Dizziness and giddiness: Secondary | ICD-10-CM | POA: Diagnosis present

## 2016-01-24 DIAGNOSIS — Z720 Tobacco use: Secondary | ICD-10-CM | POA: Diagnosis not present

## 2016-01-24 DIAGNOSIS — K352 Acute appendicitis with generalized peritonitis: Secondary | ICD-10-CM | POA: Diagnosis present

## 2016-01-24 DIAGNOSIS — I482 Chronic atrial fibrillation, unspecified: Secondary | ICD-10-CM | POA: Insufficient documentation

## 2016-01-24 DIAGNOSIS — R1032 Left lower quadrant pain: Secondary | ICD-10-CM | POA: Diagnosis not present

## 2016-01-24 DIAGNOSIS — K921 Melena: Secondary | ICD-10-CM | POA: Diagnosis not present

## 2016-01-24 DIAGNOSIS — K625 Hemorrhage of anus and rectum: Secondary | ICD-10-CM | POA: Diagnosis not present

## 2016-01-24 DIAGNOSIS — E876 Hypokalemia: Secondary | ICD-10-CM | POA: Diagnosis present

## 2016-01-24 DIAGNOSIS — N12 Tubulo-interstitial nephritis, not specified as acute or chronic: Secondary | ICD-10-CM | POA: Diagnosis present

## 2016-01-24 DIAGNOSIS — J9811 Atelectasis: Secondary | ICD-10-CM | POA: Diagnosis present

## 2016-01-24 DIAGNOSIS — E785 Hyperlipidemia, unspecified: Secondary | ICD-10-CM | POA: Diagnosis not present

## 2016-01-24 DIAGNOSIS — I110 Hypertensive heart disease with heart failure: Secondary | ICD-10-CM | POA: Diagnosis present

## 2016-01-24 DIAGNOSIS — K922 Gastrointestinal hemorrhage, unspecified: Secondary | ICD-10-CM | POA: Diagnosis not present

## 2016-01-24 DIAGNOSIS — Z7901 Long term (current) use of anticoagulants: Secondary | ICD-10-CM | POA: Diagnosis not present

## 2016-01-24 DIAGNOSIS — Z01810 Encounter for preprocedural cardiovascular examination: Secondary | ICD-10-CM

## 2016-01-24 DIAGNOSIS — A414 Sepsis due to anaerobes: Secondary | ICD-10-CM | POA: Diagnosis not present

## 2016-01-24 DIAGNOSIS — A047 Enterocolitis due to Clostridium difficile: Secondary | ICD-10-CM | POA: Diagnosis present

## 2016-01-24 DIAGNOSIS — R404 Transient alteration of awareness: Secondary | ICD-10-CM | POA: Diagnosis not present

## 2016-01-24 DIAGNOSIS — A09 Infectious gastroenteritis and colitis, unspecified: Secondary | ICD-10-CM | POA: Diagnosis not present

## 2016-01-24 DIAGNOSIS — R1031 Right lower quadrant pain: Secondary | ICD-10-CM | POA: Diagnosis not present

## 2016-01-24 LAB — CBC WITH DIFFERENTIAL/PLATELET
BASOS ABS: 0 10*3/uL (ref 0.0–0.1)
Basophils Relative: 0 %
EOS ABS: 0 10*3/uL (ref 0.0–0.7)
Eosinophils Relative: 0 %
HCT: 38.7 % (ref 36.0–46.0)
HEMOGLOBIN: 13 g/dL (ref 12.0–15.0)
LYMPHS ABS: 2.2 10*3/uL (ref 0.7–4.0)
LYMPHS PCT: 14 %
MCH: 29.4 pg (ref 26.0–34.0)
MCHC: 33.6 g/dL (ref 30.0–36.0)
MCV: 87.6 fL (ref 78.0–100.0)
Monocytes Absolute: 0.9 10*3/uL (ref 0.1–1.0)
Monocytes Relative: 6 %
NEUTROS PCT: 80 %
Neutro Abs: 13.1 10*3/uL — ABNORMAL HIGH (ref 1.7–7.7)
Platelets: 232 10*3/uL (ref 150–400)
RBC: 4.42 MIL/uL (ref 3.87–5.11)
RDW: 12.9 % (ref 11.5–15.5)
WBC: 16.3 10*3/uL — AB (ref 4.0–10.5)

## 2016-01-24 LAB — COMPREHENSIVE METABOLIC PANEL
ALK PHOS: 67 U/L (ref 38–126)
ALT: 28 U/L (ref 14–54)
AST: 21 U/L (ref 15–41)
Albumin: 3.4 g/dL — ABNORMAL LOW (ref 3.5–5.0)
Anion gap: 7 (ref 5–15)
BUN: 10 mg/dL (ref 6–20)
CALCIUM: 8.3 mg/dL — AB (ref 8.9–10.3)
CO2: 22 mmol/L (ref 22–32)
CREATININE: 0.79 mg/dL (ref 0.44–1.00)
Chloride: 110 mmol/L (ref 101–111)
GFR calc non Af Amer: >60 mL/min (ref >60)
GLUCOSE: 136 mg/dL — AB (ref 65–99)
Potassium: 3.6 mmol/L (ref 3.5–5.1)
SODIUM: 139 mmol/L (ref 135–145)
Total Bilirubin: 1.4 mg/dL — ABNORMAL HIGH (ref 0.3–1.2)
Total Protein: 6.4 g/dL — ABNORMAL LOW (ref 6.5–8.1)

## 2016-01-24 LAB — URINE MICROSCOPIC-ADD ON: RBC / HPF: NONE SEEN RBC/hpf (ref 0–5)

## 2016-01-24 LAB — URINALYSIS, ROUTINE W REFLEX MICROSCOPIC
Glucose, UA: NEGATIVE mg/dL
HGB URINE DIPSTICK: NEGATIVE
Ketones, ur: 15 mg/dL — AB
Leukocytes, UA: NEGATIVE
NITRITE: NEGATIVE
PROTEIN: 30 mg/dL — AB
SPECIFIC GRAVITY, URINE: 1.02 (ref 1.005–1.030)
pH: 5.5 (ref 5.0–8.0)

## 2016-01-24 LAB — LIPASE, BLOOD: Lipase: 20 U/L (ref 11–51)

## 2016-01-24 LAB — PROTIME-INR
INR: 1.39 (ref 0.00–1.49)
PROTHROMBIN TIME: 17.2 s — AB (ref 11.6–15.2)

## 2016-01-24 LAB — CBG MONITORING, ED: Glucose-Capillary: 133 mg/dL — ABNORMAL HIGH (ref 65–99)

## 2016-01-24 LAB — I-STAT CG4 LACTIC ACID, ED
LACTIC ACID, VENOUS: 0.97 mmol/L (ref 0.5–2.0)
Lactic Acid, Venous: 1.93 mmol/L (ref 0.5–2.0)

## 2016-01-24 LAB — I-STAT TROPONIN, ED: Troponin i, poc: 0.07 ng/mL (ref 0.00–0.08)

## 2016-01-24 LAB — APTT: aPTT: 38 seconds — ABNORMAL HIGH (ref 24–37)

## 2016-01-24 MED ORDER — HYDRALAZINE HCL 20 MG/ML IJ SOLN: 5.0000 mg | Freq: Three times a day (TID) | INTRAMUSCULAR | Status: DC | PRN

## 2016-01-24 MED ORDER — AMLODIPINE BESYLATE 10 MG PO TABS
10.0000 mg | ORAL_TABLET | Freq: Every day | ORAL | Status: DC
  Administered 2016-01-25 – 2016-01-28 (×3): 10 mg via ORAL
  Filled 2016-01-24 (×4): qty 1

## 2016-01-24 MED ORDER — ACETAMINOPHEN 650 MG RE SUPP
650.0000 mg | Freq: Four times a day (QID) | RECTAL | Status: DC | PRN
Start: 2016-01-24 — End: 2016-02-03

## 2016-01-24 MED ORDER — ACETAMINOPHEN 325 MG PO TABS: 650.0000 mg | ORAL_TABLET | Freq: Four times a day (QID) | ORAL | Status: DC | PRN

## 2016-01-24 MED ORDER — SODIUM CHLORIDE 0.9% FLUSH
3.0000 mL | Freq: Two times a day (BID) | INTRAVENOUS | Status: DC
  Administered 2016-01-24 – 2016-02-03 (×14): 3 mL via INTRAVENOUS

## 2016-01-24 MED ORDER — BISACODYL 10 MG RE SUPP: 10.0000 mg | Freq: Every day | RECTAL | Status: DC | PRN

## 2016-01-24 MED ORDER — SENNOSIDES-DOCUSATE SODIUM 8.6-50 MG PO TABS: 1.0000 | ORAL_TABLET | Freq: Every evening | ORAL | Status: DC | PRN

## 2016-01-24 MED ORDER — ONDANSETRON HCL 4 MG/2ML IJ SOLN
4.0000 mg | Freq: Four times a day (QID) | INTRAMUSCULAR | Status: DC | PRN
  Administered 2016-01-24 – 2016-01-29 (×2): 4 mg via INTRAVENOUS
  Filled 2016-01-24 (×2): qty 2

## 2016-01-24 MED ORDER — MAGNESIUM CITRATE PO SOLN: 1.0000 | Freq: Once | ORAL | Status: DC | PRN

## 2016-01-24 MED ORDER — TRAZODONE HCL 50 MG PO TABS
25.0000 mg | ORAL_TABLET | Freq: Every evening | ORAL | Status: DC | PRN
  Administered 2016-02-01: 25 mg via ORAL
  Filled 2016-01-24: qty 1

## 2016-01-24 MED ORDER — KETOROLAC TROMETHAMINE 30 MG/ML IJ SOLN
30.0000 mg | Freq: Once | INTRAMUSCULAR | Status: AC
  Administered 2016-01-24: 30 mg via INTRAVENOUS
  Filled 2016-01-24: qty 1

## 2016-01-24 MED ORDER — LISINOPRIL 10 MG PO TABS
10.0000 mg | ORAL_TABLET | Freq: Every day | ORAL | Status: DC
  Administered 2016-01-25 – 2016-02-03 (×7): 10 mg via ORAL
  Filled 2016-01-24 (×9): qty 1

## 2016-01-24 MED ORDER — PIPERACILLIN-TAZOBACTAM 3.375 G IVPB 30 MIN
3.3750 g | Freq: Once | INTRAVENOUS | Status: AC
  Administered 2016-01-24: 3.375 g via INTRAVENOUS
  Filled 2016-01-24: qty 50

## 2016-01-24 MED ORDER — ACETAMINOPHEN 325 MG PO TABS
650.0000 mg | ORAL_TABLET | Freq: Once | ORAL | Status: AC
  Administered 2016-01-24: 650 mg via ORAL
  Filled 2016-01-24: qty 2

## 2016-01-24 MED ORDER — ATORVASTATIN CALCIUM 20 MG PO TABS
20.0000 mg | ORAL_TABLET | Freq: Every day | ORAL | Status: DC
  Administered 2016-01-25 – 2016-02-03 (×9): 20 mg via ORAL
  Filled 2016-01-24 (×9): qty 1

## 2016-01-24 MED ORDER — PIPERACILLIN-TAZOBACTAM 3.375 G IVPB
3.3750 g | Freq: Three times a day (TID) | INTRAVENOUS | Status: DC
  Administered 2016-01-24 – 2016-01-31 (×20): 3.375 g via INTRAVENOUS
  Filled 2016-01-24 (×24): qty 50

## 2016-01-24 MED ORDER — SODIUM CHLORIDE 0.9 % IV BOLUS (SEPSIS)
1000.0000 mL | Freq: Once | INTRAVENOUS | Status: AC
  Administered 2016-01-24: 1000 mL via INTRAVENOUS

## 2016-01-24 MED ORDER — ONDANSETRON HCL 4 MG PO TABS: 4.0000 mg | ORAL_TABLET | Freq: Four times a day (QID) | ORAL | Status: DC | PRN

## 2016-01-24 MED ORDER — HYDROCODONE-ACETAMINOPHEN 5-325 MG PO TABS
1.0000 | ORAL_TABLET | ORAL | Status: DC | PRN
  Administered 2016-01-25 – 2016-01-26 (×2): 1 via ORAL
  Administered 2016-01-26: 2 via ORAL
  Filled 2016-01-24 (×2): qty 1
  Filled 2016-01-24: qty 2

## 2016-01-24 MED ORDER — IOPAMIDOL (ISOVUE-300) INJECTION 61%
INTRAVENOUS | Status: AC
  Administered 2016-01-24: 100 mL
  Filled 2016-01-24: qty 100

## 2016-01-24 MED ORDER — MORPHINE SULFATE (PF) 2 MG/ML IV SOLN
1.0000 mg | INTRAVENOUS | Status: DC | PRN
  Administered 2016-01-24 – 2016-01-26 (×3): 1 mg via INTRAVENOUS
  Filled 2016-01-24 (×3): qty 1

## 2016-01-24 MED ORDER — NICOTINE 14 MG/24HR TD PT24
14.0000 mg | MEDICATED_PATCH | Freq: Every day | TRANSDERMAL | Status: DC
  Administered 2016-01-25 – 2016-02-03 (×9): 14 mg via TRANSDERMAL
  Filled 2016-01-24 (×10): qty 1

## 2016-01-24 MED ORDER — SODIUM CHLORIDE 0.9 % IV BOLUS (SEPSIS)
30.0000 mL/kg | Freq: Once | INTRAVENOUS | Status: AC
  Administered 2016-01-24: 1000 mL via INTRAVENOUS

## 2016-01-24 MED ORDER — FENTANYL CITRATE (PF) 100 MCG/2ML IJ SOLN
50.0000 ug | Freq: Once | INTRAMUSCULAR | Status: AC
  Administered 2016-01-24: 50 ug via INTRAVENOUS
  Filled 2016-01-24: qty 2

## 2016-01-24 MED ORDER — SODIUM CHLORIDE 0.9 % IV SOLN
INTRAVENOUS | Status: DC
  Administered 2016-01-24: 100 mL/h via INTRAVENOUS
  Administered 2016-01-24: 10 mL/h via INTRAVENOUS

## 2016-01-24 NOTE — ED Provider Notes (Signed)
CSN: [id number]     Arrival date & time 01/24/16  1126 History   First MD Initiated Contact with Patient 01/24/16 1150     Chief Complaint  Patient presents with  . Loss of Consciousness   Patient [place] History of present illness obtained via Nurse, learning disability  (Consider location/radiation/quality/duration/timing/severity/associated sxs/prior Treatment) HPI Linda [name] is a 75 y.o. female history of hypertension, hyperlipidemia, A. fib, here from urgent care facility for evaluation of abdominal pain and loss of consciousness. Patient reportedly had syncopal episode in the waiting room of urgent care facility, presented with abdominal pain, new T-wave changes on EKG.  Patient reports she ate a banana on Friday which gave her food poisoning and since that time has had diffuse abdominal discomfort with associated diarrhea. She reports 10 bowel movements that are liquid brown stool. She denies any fevers, nausea or vomiting, chest pain, shortness of breath, melena, hematochezia. No hospitalizations, no antibiotic use. She reports that she recently ran out of "heart medications", but cannot elaborate. Old notes would suggest that she should be on Coumadin for her A. fib, amlodipine and lisinopril.   Past Medical History  Diagnosis Date  . Hypertension   . Hyperlipidemia   . Tobacco abuse     Ongoing (3-6 cigarettes per day, 25 pack year history)  . History of CVA (cerebrovascular accident)     New diagnosis December 08, 2010  . Atrial fibrillation (HCC)     dx in setting of stroke 4/12;  echo with EF 60-65%, mild LVH, mild AI, grade 1 diast dysfxn  . Impaired glucose tolerance 05/08/2011  . Hx of cardiovascular stress test     a. Myoview 8/12:  EF 69%, no ischemia.   . Coronary artery disease    History reviewed. No pertinent past surgical history. Family History  Problem Relation Age of Onset  . Coronary artery disease Neg Hx    Social History  Substance Use Topics  . Smoking status:  Light Tobacco Smoker -- 1.00 packs/day for 25 years    Types: Cigarettes  . Smokeless tobacco: None     Comment: 3-6 cigarettes per day  . Alcohol Use: No   OB History    No data available     Review of Systems  A 10 point review of systems was completed and was negative except for pertinent positives and negatives as mentioned in the history of present illness    Allergies  Review of patient's allergies indicates no known allergies.  Home Medications   Prior to Admission medications   Medication Sig Start Date End Date Taking? Authorizing Provider  amLODipine (NORVASC) 10 MG tablet TAKE 1 TABLET BY MOUTH EVERY DAY    Duke Salvia, MD  atorvastatin (LIPITOR) 20 MG tablet TAKE 1 TABLET BY MOUTH EVERY DAY    Duke Salvia, MD  clobetasol cream (TEMOVATE) 0.05 % Apply 1 application topically 3 (three) times daily.    Historical Provider, MD  lisinopril (PRINIVIL,ZESTRIL) 10 MG tablet TAKE 1 TABLET BY MOUTH EVERY DAY 05/11/14   Duke Salvia, MD  warfarin (COUMADIN) 5 MG tablet TAKE AS DIRECTED BY COUMADIN CLINIC 03/25/14   Duke Salvia, MD   BP 121/66 mmHg  Pulse 61  Temp(Src) 100.3 F (37.9 C) (Oral)  Resp 15  SpO2 98% Physical Exam  Constitutional: She is oriented to person, place, and time. She appears well-developed and well-nourished.  HENT:  Head: Normocephalic and atraumatic.  Mouth/Throat: Oropharynx is clear and moist.  Eyes: Conjunctivae are normal. Pupils are equal, round, and reactive to light. Right eye exhibits no discharge. Left eye exhibits no discharge. No scleral icterus.  Neck: Neck supple.  Cardiovascular: Normal rate, regular rhythm and normal heart sounds.   Pulmonary/Chest: Effort normal and breath sounds normal. No respiratory distress. She has no wheezes. She has no rales.  Abdominal: Soft.  Diffuse tenderness throughout abdomen. No distention. She does have guarding diffusely. No lesions or deformities.  Musculoskeletal: She exhibits no  tenderness.  Neurological: She is alert and oriented to person, place, and time.  Cranial Nerves II-XII grossly intact  Skin: Skin is warm and dry. No rash noted.  Psychiatric: She has a normal mood and affect.  Nursing note and vitals reviewed.   ED Course  Procedures (including critical care time) Labs Review Labs Reviewed  CBC WITH DIFFERENTIAL/PLATELET - Abnormal; Notable for the following:    WBC 16.3 (*)    Neutro Abs 13.1 (*)    All other components within normal limits  COMPREHENSIVE METABOLIC PANEL - Abnormal; Notable for the following:    Glucose, Bld 136 (*)    Calcium 8.3 (*)    Total Protein 6.4 (*)    Albumin 3.4 (*)    Total Bilirubin 1.4 (*)    All other components within normal limits  PROTIME-INR - Abnormal; Notable for the following:    Prothrombin Time 17.2 (*)    All other components within normal limits  CBG MONITORING, ED - Abnormal; Notable for the following:    Glucose-Capillary 133 (*)    All other components within normal limits  CULTURE, BLOOD (ROUTINE X 2)  CULTURE, BLOOD (ROUTINE X 2)  LIPASE, BLOOD  URINALYSIS, ROUTINE W REFLEX MICROSCOPIC (NOT AT [facility])  I-STAT CG4 LACTIC ACID, ED  I-STAT TROPOININ, ED  I-STAT CG4 LACTIC ACID, ED    Imaging Review Ct Abdomen Pelvis W Contrast  01/24/2016  CLINICAL DATA:  Left lower quadrant abdominal pain. EXAM: CT ABDOMEN AND PELVIS WITH CONTRAST TECHNIQUE: Multidetector CT imaging of the abdomen and pelvis was performed using the standard protocol following bolus administration of intravenous contrast. CONTRAST:  100mL ISOVUE-300 IOPAMIDOL (ISOVUE-300) INJECTION 61% COMPARISON:  None. FINDINGS: Normal normal lung bases. The appendix is abnormal in appearance at its distal tip. The proximal 90% of the appendix enhances normally. However, there is decreased enhancement at the distal tip with some adjacent stranding and discontinuity of mucosal enhancement. The distal appendix is also mildly dilated measuring  between 8 or 9 mm. There are a few dots of free air adjacent to the distal tip is well. No other free air identified. There is a small amount of free fluid in the pelvis on the right. Cholelithiasis is identified. The liver, portal veins, and spleen are normal. The left adrenal gland is normal. There is an enhancing nodule in the right adrenal gland measuring 12 mm. Mild prominence of the pancreatic duct with no obstructing mass or cause identified. The common bile duct is normal in caliber. The pancreas is otherwise normal. A few small renal cysts are seen on the left. A few are too small to characterize. No suspicious renal masses. No ureterectasis or ureteral stones. There are regions of decreased cortical enhancement on the left with no adjacent stranding. This becomes more prominent on delayed images. No filling defects in the upper renal collecting systems. The stomach is normal. There are multiple prominent mildly dilated loops of small bowel with intervening loops of normal caliber small bowel. Given the  findings in the right lower quadrant, this is favored to represent developing ileus. The colon is unremarkable. There is a non aneurysmal atherosclerotic aorta. No adenopathy. No adenopathy or suspicious mass in the pelvis. The uterus and right ovary are normal. There appear to be several cysts in the left ovary with the largest measuring up to 2.5 cm. This is asymmetric to the right. No other abnormalities are seen within the pelvis. Delayed images through the upper kidney demonstrate no filling defects in the renal collecting system. IMPRESSION: 1. The findings are consistent with a perforated appendicitis. The appearance is somewhat unusual with lack of enhancement at the distal tip and discontinuity of the mucosa. While this could represent a typical appendicitis, appendicitis due to a mass at the distal tip is not completely excluded. Recommend correlation with pathology. 2. Striated nephrogram on the  left with no adjacent stranding. This is an age-indeterminate finding but pyelonephritis is not excluded given the history of left-sided pain. Recommend correlation with urinalysis and clinical exam. 3. 12 mm enhancing nodule in the right adrenal gland. Recommend MRI for further characterization. 4. Probable developing small-bowel ileus. 5. Multiple cysts in the left ovary with the largest measuring up to 2.5 cm. This is a somewhat unusual appearance in a postmenopausal woman. Recommend correlation with ultrasound for better assessment of cyst character and size. Findings called to Dr. Clydene Pugh. Electronically Signed   By: Gerome Sam III M.D   On: 01/24/2016 14:46   Dg Abd Acute W/chest  01/24/2016  CLINICAL DATA:  RIGHT lower quadrant pain of unspecified duration EXAM: DG ABDOMEN ACUTE W/ 1V CHEST COMPARISON:  Chest radiograph 12/08/2010 FINDINGS: Enlargement of cardiac silhouette. Atherosclerotic calcification aorta. Mediastinal contours and pulmonary vascularity otherwise normal for technique. Suspected RIGHT nipple shadow, with LEFT nipple shadow projecting external to the costal margin. No definite acute infiltrate, pleural effusion or pneumothorax. Minimal subsegmental atelectasis at LEFT base. Prominent small bowel loops in mid abdomen with lesser degrees of colonic gas are seen scattered throughout the colon. No definite bowel wall thickening or free intraperitoneal air. Bones demineralized with degenerative changes lumbar spine. No urinary tract calcification. IMPRESSION: Enlargement of cardiac silhouette with minimal LEFT basilar atelectasis. Air-filled small bowel loops in the mid abdomen prominent in number and size though some gas is seen throughout the colon ; this could represent mild small bowel ileus but early versus partial small-bowel obstruction not completely excluded. Electronically Signed   By: Ulyses Southward M.D.   On: 01/24/2016 13:04   I have personally reviewed and evaluated these  images and lab results as part of my medical decision-making.   EKG Interpretation   Date/Time:  Monday January 24 2016 11:40:17 EDT Ventricular Rate:  71 PR Interval:  160 QRS Duration: 80 QT Interval:  458 QTC Calculation: 498 R Axis:   28 Text Interpretation:  Sinus rhythm Low voltage, precordial leads T wave  inversion now evident in Inferolateral leads Confirmed by KNOTT MD, DANIEL  ([id number]) on 01/24/2016 12:33:51 PM     Filed Vitals:   01/24/16 1500 01/24/16 1503 01/24/16 1530 01/24/16 1545  BP: 129/69 129/69 130/62 121/66  Pulse: 63 62 61 61  Temp:      TempSrc:      Resp: 20 16 18 15   SpO2: 97% 97% 97% 98%   Meds given in ED:  Medications  acetaminophen (TYLENOL) tablet 650 mg (650 mg Oral Given 01/24/16 1236)  ketorolac (TORADOL) 30 MG/ML injection 30 mg (30 mg Intravenous Given 01/24/16 1236)  fentaNYL (SUBLIMAZE) injection 50 mcg (50 mcg Intravenous Given 01/24/16 1236)  sodium chloride 0.9 % bolus 1,000 mL (0 mLs Intravenous Stopped 01/24/16 1349)  iopamidol (ISOVUE-300) 61 % injection (100 mLs  Contrast Given 01/24/16 1402)  sodium chloride 0.9 % bolus 30 mL/kg (1,000 mLs Intravenous New Bag/Given 01/24/16 1508)  piperacillin-tazobactam (ZOSYN) IVPB 3.375 g (0 g Intravenous Stopped 01/24/16 1538)    New Prescriptions   No medications on file    MDM  Patient with abdominal discomfort since Friday, sent from urgent care for evaluation of abdominal pain, syncopal event and EKG changes. On arrival, she is even in stable, temp 100.30F. She does have a diffusely tender abdomen. Labs are significant for a leukocytosis of 16.3. CT significant for a likely ruptured appendix. Lactic acid 0.9. Troponin neg at 0.07. T-wave inversions on EKG likely due to infectious process and not necessarily cardiac in etiology. Discussed with my attending, Dr. Clydene Pugh. Plan to consult general surgery. Patient given 30 mL per KG fluids, blood cultures obtained, started empirically on Zosyn in emergency  department. Discussed with general surgery, Will Casimer Bilis, plan for medical admission and they will consult. Discussed with hospitalist,Wertman PA-C. They will see in emergency department and admit. Final diagnoses:  Generalized abdominal pain        Joycie Peek, PA-C 01/25/16 2057  Lyndal Pulley, MD 01/26/16 1800

## 2016-01-24 NOTE — H&P (Signed)
recentlyas a History and Physical    Linda PineDoan T Brockbank RUE:[id number] DOB: 13-Apr-1941 DOA: 01/24/2016   PCP: Nicki ReaperBAITY, REGINA, NP   Patient coming from:  Home Chief Complaint: Abdominal pain   HPI: Linda [name] is a 75 y.o. female with medical history significant for HTN, HLD, Afib on Coumadin, Tobacco/COPD, h/o CVA,brought by EMS from urgent care facility due to loss of consciousness. The patient was presenting for evaluation of  day history of abdominal pain. This pain was preceded by 1 day history of loose stools, and cramping with a total of 10 bowel movements, believed to be die to food poisoning, for which she took Imodium with some improvement. Symptoms returned on late Sunday nightand today presented to  urgent care. She denies any nausea or vomiting. She denies any chest pain or shortness of breath. She reports some dizziness but this is intermittent.  She denies any melena or hematochezia.The patient did not have any recent hospitalization. She was not on any antibiotics prior to this admission. No recent trips. She did not take any over-the-counter products other than Imodium. In fact, she reports burning out of her heart medications 20 days prior, as she could not get anybody to refill her meds including Coumadin.   ED Course:  BP 134/67 mmHg  Pulse 61  Temp(Src) 100.3 F (37.9 C) (Oral)  Resp 17  SpO2 97% CT abdomen and pelvis shows  findings are consistent with a perforated appendicitis. Incidental 12 mm enhancing nodule in the right adrenal gland was seen   Probable developing small-bowel ileus. White count 16.3. Lactic acid 0.9. Troponin 0.07. EKGwith sinus rhythm with TWI evident in inferolateral leads likely due to infectious process.received IV fluids, Toradol, and was placed on Zosyn IV  Review of Systems: As per HPI otherwise 10 point review of systems negative.   Past Medical History  Diagnosis Date  . Hypertension   . Hyperlipidemia   . Tobacco abuse     Ongoing (3-6 cigarettes  per day, 25 pack year history)  . History of CVA (cerebrovascular accident)     New diagnosis December 08, 2010  . Atrial fibrillation (HCC)     dx in setting of stroke 4/12;  echo with EF 60-65%, mild LVH, mild AI, grade 1 diast dysfxn  . Impaired glucose tolerance 05/08/2011  . Hx of cardiovascular stress test     a. Myoview 8/12:  EF 69%, no ischemia.   . Coronary artery disease     History reviewed. No pertinent past surgical history.  Social History  reports that she has been smoking Cigarettes.  She has a 25 pack-year smoking history. She does not have any smokeless tobacco history on file. She reports that she does not drink alcohol or use illicit drugs.  Walks unassisted  with cane  with walker Patient is on  wheelchair  No Known Allergies  Family History  Problem Relation Age of Onset  . Coronary artery disease Neg Hx       Prior to Admission medications   Medication Sig Start Date End Date Taking? Authorizing Provider  amLODipine (NORVASC) 10 MG tablet TAKE 1 TABLET BY MOUTH EVERY DAY    Duke SalviaSteven C Klein, MD  atorvastatin (LIPITOR) 20 MG tablet TAKE 1 TABLET BY MOUTH EVERY DAY    Duke SalviaSteven C Klein, MD  clobetasol cream (TEMOVATE) 0.05 % Apply 1 application topically 3 (three) times daily.    Historical Provider, MD  lisinopril (PRINIVIL,ZESTRIL) 10 MG tablet TAKE 1 TABLET BY MOUTH  EVERY DAY 05/11/14   Duke Salvia, MD  warfarin (COUMADIN) 5 MG tablet TAKE AS DIRECTED BY COUMADIN CLINIC 03/25/14   Duke Salvia, MD    Physical Exam:    Filed Vitals:   01/24/16 1545 01/24/16 1615 01/24/16 1630 01/24/16 1645  BP: 121/66 138/62 139/64 134/67  Pulse: 61 63 59 61  Temp:      TempSrc:      Resp: SpO2: 98% 97% 97% 97%      Constitutional: NAD, calm, comfortable Filed Vitals:   01/24/16 1545 01/24/16 1615 01/24/16 1630 01/24/16 1645  BP: 121/66 138/62 139/64 134/67  Pulse: 61 63 59 61  Temp:      TempSrc:      Resp: SpO2: 98% 97% 97% 97%    Eyes: PERRL, lids and conjunctivae normal ENMT: Mucous membranes are moist. Posterior pharynx clear of any exudate or lesions.Normal dentition.  Neck: normal, supple, no masses, no thyromegaly Respiratory: clear to auscultation bilaterally, no wheezing, no crackles. Normal respiratory effort. No accessory muscle use.  Cardiovascular: Regular rate and rhythm, no murmurs / rubs / gallops. No extremity edema. 2+ pedal pulses. No carotid bruits.  Abdomen: mildly distended, diffusely tender to palpation, worse at both lower quadrants, BS decreased  Musculoskeletal: no clubbing / cyanosis. No joint deformity upper and lower extremities. Good ROM, no contractures. Normal muscle tone.  Skin: no rashes, lesions, ulcers.  Neurologic: CN 2-12 grossly intact. Sensation intact, DTR normal. Strength 5/5 in all 4.  Psychiatric: Normal judgment and insight. Alert and oriented x 3. Normal mood.     Labs on Admission: I have personally reviewed following labs and imaging studies  CBC:  Recent Labs Lab 01/24/16 1135  WBC 16.3*  NEUTROABS 13.1*  HGB 13.0  HCT 38.7  MCV 87.6  PLT 232    Basic Metabolic Panel:  Recent Labs Lab 01/24/16 1135  NA 139  K 3.6  CL 110  CO2 22  GLUCOSE 136*  BUN 10  CREATININE 0.79  CALCIUM 8.3*    GFR: CrCl cannot be calculated (Unknown ideal weight.).  Liver Function Tests:  Recent Labs Lab 01/24/16 1135  AST 21  ALT 28  ALKPHOS 67  BILITOT 1.4*  PROT 6.4*  ALBUMIN 3.4*    Recent Labs Lab 01/24/16 1135  LIPASE 20   No results for input(s): AMMONIA in the last 168 hours.  Coagulation Profile:  Recent Labs Lab 01/24/16 1549  INR 1.39    Cardiac Enzymes: No results for input(s): CKTOTAL, CKMB, CKMBINDEX, TROPONINI in the last 168 hours.  BNP (last 3 results) No results for input(s): PROBNP in the last 8760 hours.  HbA1C: No results for input(s): HGBA1C in the last 72 hours.  CBG:  Recent Labs Lab 01/24/16 1136  GLUCAP  133*    Lipid Profile: No results for input(s): CHOL, HDL, LDLCALC, TRIG, CHOLHDL, LDLDIRECT in the last 72 hours.  Thyroid Function Tests: No results for input(s): TSH, T4TOTAL, FREET4, T3FREE, THYROIDAB in the last 72 hours.  Anemia Panel: No results for input(s): VITAMINB12, FOLATE, FERRITIN, TIBC, IRON, RETICCTPCT in the last 72 hours.  Urine analysis: No results found for: COLORURINE, APPEARANCEUR, LABSPEC, PHURINE, GLUCOSEU, HGBUR, BILIRUBINUR, KETONESUR, PROTEINUR, UROBILINOGEN, NITRITE, LEUKOCYTESUR  Sepsis Labs: (procalcitonin:4,lacticidven:4) )No results found for this or any previous visit (from the past 240 hour(s)).   Radiological Exams on Admission: Ct Abdomen Pelvis W Contrast  01/24/2016  CLINICAL DATA:  Left lower quadrant  abdominal pain. EXAM: CT ABDOMEN AND PELVIS WITH CONTRAST TECHNIQUE: Multidetector CT imaging of the abdomen and pelvis was performed using the standard protocol following bolus administration of intravenous contrast. CONTRAST:  ISOVUE-300 IOPAMIDOL (ISOVUE-300) INJECTION 61% COMPARISON:  None. FINDINGS: Normal normal lung bases. The appendix is abnormal in appearance at its distal tip. The proximal 90% of the appendix enhances normally. However, there is decreased enhancement at the distal tip with some adjacent stranding and discontinuity of mucosal enhancement. The distal appendix is also mildly dilated measuring between 8 or 9 mm. There are a few dots of free air adjacent to the distal tip is well. No other free air identified. There is a small amount of free fluid in the pelvis on the right. Cholelithiasis is identified. The liver, portal veins, and spleen are normal. The left adrenal gland is normal. There is an enhancing nodule in the right adrenal gland measuring 12 mm. Mild prominence of the pancreatic duct with no obstructing mass or cause identified. The common bile duct is normal in caliber. The pancreas is otherwise normal. A few  small renal cysts are seen on the left. A few are too small to characterize. No suspicious renal masses. No ureterectasis or ureteral stones. There are regions of decreased cortical enhancement on the left with no adjacent stranding. This becomes more prominent on delayed images. No filling defects in the upper renal collecting systems. The stomach is normal. There are multiple prominent mildly dilated loops of small bowel with intervening loops of normal caliber small bowel. Given the findings in the right lower quadrant, this is favored to represent developing ileus. The colon is unremarkable. There is a non aneurysmal atherosclerotic aorta. No adenopathy. No adenopathy or suspicious mass in the pelvis. The uterus and right ovary are normal. There appear to be several cysts in the left ovary with the largest measuring up to 2.5 cm. This is asymmetric to the right. No other abnormalities are seen within the pelvis. Delayed images through the upper kidney demonstrate no filling defects in the renal collecting system. IMPRESSION: 1. The findings are consistent with a perforated appendicitis. The appearance is somewhat unusual with lack of enhancement at the distal tip and discontinuity of the mucosa. While this could represent a typical appendicitis, appendicitis due to a mass at the distal tip is not completely excluded. Recommend correlation with pathology. 2. Striated nephrogram on the left with no adjacent stranding. This is an age-indeterminate finding but pyelonephritis is not excluded given the history of left-sided pain. Recommend correlation with urinalysis and clinical exam. 3. 12 mm enhancing nodule in the right adrenal gland. Recommend MRI for further characterization. 4. Probable developing small-bowel ileus. 5. Multiple cysts in the left ovary with the largest measuring up to 2.5 cm. This is a somewhat unusual appearance in a postmenopausal woman. Recommend correlation with ultrasound for better  assessment of cyst character and size. Findings called to Dr. Clydene Pugh. Electronically Signed   By: Gerome Sam III M.D   On: 01/24/2016 14:46   Dg Abd Acute W/chest  01/24/2016  CLINICAL DATA:  RIGHT lower quadrant pain of unspecified duration EXAM: DG ABDOMEN ACUTE W/ 1V CHEST COMPARISON:  Chest radiograph 12/08/2010 FINDINGS: Enlargement of cardiac silhouette. Atherosclerotic calcification aorta. Mediastinal contours and pulmonary vascularity otherwise normal for technique. Suspected RIGHT nipple shadow, with LEFT nipple shadow projecting external to the costal margin. No definite acute infiltrate, pleural effusion or pneumothorax. Minimal subsegmental atelectasis at LEFT base. Prominent small bowel loops in mid abdomen  with lesser degrees of colonic gas are seen scattered throughout the colon. No definite bowel wall thickening or free intraperitoneal air. Bones demineralized with degenerative changes lumbar spine. No urinary tract calcification. IMPRESSION: Enlargement of cardiac silhouette with minimal LEFT basilar atelectasis. Air-filled small bowel loops in the mid abdomen prominent in number and size though some gas is seen throughout the colon ; this could represent mild small bowel ileus but early versus partial small-bowel obstruction not completely excluded. Electronically Signed   By: Ulyses Southward M.D.   On: 01/24/2016 13:04    EKG: Independently reviewed.  Assessment/Plan Active Problems:   * No active hospital problems. *   Acute abdominal pain without melena  CT abdomen and pelvis shows findings consistent with a perforated appendicitis  Probable developing small-bowel ileus. Patient is febrile, Tmax 100.3.CBC is remarkable for white count of 16.3. CMET is unremarkable. Received 2 L fluids  Lactic Acid dropped from 1.93 to 0.9 with fluids NPO IV fluids  Pain control with IV Dilaudid  Blood cultures IV Zosyn Surgical evaluation is pending, will decide on OR in the morning   Syncope  in a patient with a history of Afib, not on meds for at least 20 days. Patient developed a syncopal episode as OP, in the setting of infection, no seizures. Alert, not confused. Continues to have intermittent dizziness. Dennie Bible has a h/o stroke and has not beem on anticoagulation for about 20 days -echocardiogram to rule out heart failure Will consider CT head   Atrial Fibrillation CHA2DS2-VASc score 5 Patient current out all of her medications about 20 days ago,. She is to be restarted on her medical regimen including Norvasc for rate contriol. We will restart Coumadin after surgical evaluation the infant's the patient not to undergo OR   CAD, with EKG with inferolateral leads likely due to infectious process. Tn negative .patient is cardiac pain free at this time. Continue home meds for now, will repeat EKG in am Serial troponins   Hyperlipidemia Continue home statins   Possible pyelonephritis per CT as evidenced by Striated nephrogram on the left.  Not symptomatic.  No CVAT, + leukocytosis. Febrile. Lactate normal UA with cultures. BCx pendings.  Continue Zosyn   Hypertension BP 134/67 mmHg  Restart home meds as above Continue home anti-hypertensive medications.  Add hydralazine for BP >160/90   Adrenal mass  Incidental 12 mm enhancing nodule in the right adrenal gland seen on CT Poss MRI when patient stable for further delineate.   Tobacco Abuse Initiate tobacco cessation Nicotine gum   DVT prophylaxis:  SCDs for now, then Coumadin  Code Status:   Full   Family Communication:  Discussed with patient Disposition Plan: Expect patient to be discharged to home after condition improves Consults called:    None Admission status: SDU    [facility], PA-C [facility]   If 7PM-7AM, please contact night-coverage [email or web address] Password [email or web address]  01/24/2016, 5:00 PM

## 2016-01-24 NOTE — ED Notes (Signed)
Pt states she is unable to urinate.

## 2016-01-24 NOTE — ED Notes (Signed)
[facility] arrives from [facility]. Pt had a syncopal episode while in the lobby waiting to be seen. Staff reports to EMS it was a brief loc. Pt has c/o LLQ pain, denies n/v/d.

## 2016-01-24 NOTE — ED Notes (Signed)
Patient transported to CT 

## 2016-01-24 NOTE — Consult Note (Signed)
Reason for Consult: Possible perforated appendix Referring Physician: D. Fredna [name] is an 75 y.o. female.  HPI: [place] lady on chronic coumadin for AF, [facility]ope [facility] while waiting to be seen.  She had a brief LOC and when she awoke complained of abdominal pain that started yesterday, along with nausea and vomiting.  Also reporting multiple loose stools    She was transferred to [facility] ED for evaluation.  Pt is on chronic anticoagulation for AF and history of CVA.  At office visit last year she was also still smoking. Granddaughter in [place] was our Optometrist on a land line so it was a long and difficult process.  Pt got diarrhea on Friday 01/21/16 and thought she had food poisoning. No real pain at that time.   She took some medicine on Saturday and it sounds like she had a pretty good day.  She says yesterday she started having pain and she point to mid abdomen, right and left sides of her lower abdomen.  Even now she points and agrees's left lower quad is most significant pain.  She was able to eat rice and vegetables yesterday.  Today she is still having pain, no nausea or vomiting today as far as I can tell.  She is not complaining of diarrhea, but does smell like she has had some recent loose stool.   .  She reports being out of some of her medicines for some time.  Granddaughter relates issues with the cars and they have been unable to get her to doctors and get her medicines.   Evaluation in the ED reports she complained of pain LLQ.  She had a temperature of 100.3.    Labs show a mild elevation of the bilirubin and T Bilirubin of 1.4.  WBC is 16.3. Troponin is 0.07, lactate 0.97.  3 way abdomen shows enlarged cardiac shadow, left basilar atelectasis. Air-filled small bowel loops in the mid abdomen prominent in number and size though some gas is seen throughout the colon ; this could represent mild small bowel ileus but early versus partial small-bowel obstruction not  completely excluded. CT scan was then obtained and this shows: possible perforated appendicitis, the is a proximal mass at the base of the appendix.  Striated nephrogram on the left with possible pyelonephritis.  12 mm right adrenal nodule, multiple left ovarian cyst.     Film reviewed by Dr. Brantley Stage, he recommends Medicine admit and we will follow.      Past Medical History  Diagnosis Date  . Hypertension   . Hyperlipidemia   . Tobacco abuse     Ongoing (3-6 cigarettes per day, 25 pack year history)  . History of CVA (cerebrovascular accident)     New diagnosis December 08, 2010  . Atrial fibrillation ([place])     dx in setting of stroke 4/12;  echo with EF 60-65%, mild LVH, mild AI, grade 1 diast dysfxn  . Impaired glucose tolerance 05/08/2011  . Hx of cardiovascular stress test     a. Myoview 8/12:  EF 69%, no ischemia.   . Coronary artery disease     History reviewed. No pertinent past surgical history.  Family History  Problem Relation Age of Onset  . Coronary artery disease Neg Hx     Social History:  reports that she has been smoking Cigarettes.  She has a 25 pack-year smoking history. She does not have any smokeless tobacco history on file. She reports  that she does not drink alcohol or use illicit drugs.  Allergies: No Known Allergies  Prior to Admission medications   Out of some of these for a couple weeks  Medication Sig Start Date End Date Taking? Authorizing Provider  amLODipine (NORVASC) 10 MG tablet TAKE 1 TABLET BY MOUTH EVERY DAY    Deboraha Sprang, MD  atorvastatin (LIPITOR) 20 MG tablet TAKE 1 TABLET BY MOUTH EVERY DAY    Deboraha Sprang, MD  clobetasol cream (TEMOVATE) 8.08 % Apply 1 application topically 3 (three) times daily.    Historical Provider, MD  lisinopril (PRINIVIL,ZESTRIL) 10 MG tablet TAKE 1 TABLET BY MOUTH EVERY DAY 05/11/14   Deboraha Sprang, MD  warfarin (COUMADIN) 5 MG tablet TAKE AS DIRECTED BY COUMADIN CLINIC 03/25/14   Deboraha Sprang, MD      Results for orders placed or performed during the hospital encounter of 01/24/16 (from the past 48 hour(s))  CBC with Differential     Status: Abnormal   Collection Time: 01/24/16 11:35 AM  Result Value Ref Range   WBC 16.3 (H) 4.0 - 10.5 K/uL   RBC 4.42 3.87 - 5.11 MIL/uL   Hemoglobin 13.0 12.0 - 15.0 g/dL   HCT 38.7 36.0 - 46.0 %   MCV 87.6 78.0 - 100.0 fL   MCH 29.4 26.0 - 34.0 pg   MCHC 33.6 30.0 - 36.0 g/dL   RDW 12.9 11.5 - 15.5 %   Platelets 232 150 - 400 K/uL   Neutrophils Relative % 80 %   Neutro Abs 13.1 (H) 1.7 - 7.7 K/uL   Lymphocytes Relative 14 %   Lymphs Abs 2.2 0.7 - 4.0 K/uL   Monocytes Relative 6 %   Monocytes Absolute 0.9 0.1 - 1.0 K/uL   Eosinophils Relative 0 %   Eosinophils Absolute 0.0 0.0 - 0.7 K/uL   Basophils Relative 0 %   Basophils Absolute 0.0 0.0 - 0.1 K/uL  Comprehensive metabolic panel     Status: Abnormal   Collection Time: 01/24/16 11:35 AM  Result Value Ref Range   Sodium 139 135 - 145 mmol/L   Potassium 3.6 3.5 - 5.1 mmol/L   Chloride 110 101 - 111 mmol/L   CO2 22 22 - 32 mmol/L   Glucose, Bld 136 (H) 65 - 99 mg/dL   BUN 10 6 - 20 mg/dL   Creatinine, Ser 0.79 0.44 - 1.00 mg/dL   Calcium 8.3 (L) 8.9 - 10.3 mg/dL   Total Protein 6.4 (L) 6.5 - 8.1 g/dL   Albumin 3.4 (L) 3.5 - 5.0 g/dL   AST 21 15 - 41 U/L   ALT 28 14 - 54 U/L   Alkaline Phosphatase 67 38 - 126 U/L   Total Bilirubin 1.4 (H) 0.3 - 1.2 mg/dL   GFR calc non Af Amer >60 >60 mL/min   GFR calc Af Amer >60 >60 mL/min    Comment: (NOTE) The eGFR has been calculated using the CKD EPI equation. This calculation has not been validated in all clinical situations. eGFR's persistently <60 mL/min signify possible Chronic Kidney Disease.    Anion gap 7 5 - 15  Lipase, blood     Status: None   Collection Time: 01/24/16 11:35 AM  Result Value Ref Range   Lipase 20 11 - 51 U/L  CBG monitoring, ED     Status: Abnormal   Collection Time: 01/24/16 11:36 AM  Result Value Ref  Range   Glucose-Capillary 133 (H) 65 - 99  mg/dL  I-stat troponin, ED     Status: None   Collection Time: 01/24/16 11:47 AM  Result Value Ref Range   Troponin i, poc 0.07 0.00 - 0.08 ng/mL   Comment 3            Comment: Due to the release kinetics of cTnI, a negative result within the first hours of the onset of symptoms does not rule out myocardial infarction with certainty. If myocardial infarction is still suspected, repeat the test at appropriate intervals.   I-Stat CG4 Lactic Acid, ED     Status: None   Collection Time: 01/24/16 11:49 AM  Result Value Ref Range   Lactic Acid, Venous 1.93 0.5 - 2.0 mmol/L  I-Stat CG4 Lactic Acid, ED     Status: None   Collection Time: 01/24/16  3:14 PM  Result Value Ref Range   Lactic Acid, Venous 0.97 0.5 - 2.0 mmol/L    Ct Abdomen Pelvis W Contrast  01/24/2016  CLINICAL DATA:  Left lower quadrant abdominal pain. EXAM: CT ABDOMEN AND PELVIS WITH CONTRAST TECHNIQUE: Multidetector CT imaging of the abdomen and pelvis was performed using the standard protocol following bolus administration of intravenous contrast. CONTRAST:  119m ISOVUE-300 IOPAMIDOL (ISOVUE-300) INJECTION 61% COMPARISON:  None. FINDINGS: Normal normal lung bases. The appendix is abnormal in appearance at its distal tip. The proximal 90% of the appendix enhances normally. However, there is decreased enhancement at the distal tip with some adjacent stranding and discontinuity of mucosal enhancement. The distal appendix is also mildly dilated measuring between 8 or 9 mm. There are a few dots of free air adjacent to the distal tip is well. No other free air identified. There is a small amount of free fluid in the pelvis on the right. Cholelithiasis is identified. The liver, portal veins, and spleen are normal. The left adrenal gland is normal. There is an enhancing nodule in the right adrenal gland measuring 12 mm. Mild prominence of the pancreatic duct with no obstructing mass or cause  identified. The common bile duct is normal in caliber. The pancreas is otherwise normal. A few small renal cysts are seen on the left. A few are too small to characterize. No suspicious renal masses. No ureterectasis or ureteral stones. There are regions of decreased cortical enhancement on the left with no adjacent stranding. This becomes more prominent on delayed images. No filling defects in the upper renal collecting systems. The stomach is normal. There are multiple prominent mildly dilated loops of small bowel with intervening loops of normal caliber small bowel. Given the findings in the right lower quadrant, this is favored to represent developing ileus. The colon is unremarkable. There is a non aneurysmal atherosclerotic aorta. No adenopathy. No adenopathy or suspicious mass in the pelvis. The uterus and right ovary are normal. There appear to be several cysts in the left ovary with the largest measuring up to 2.5 cm. This is asymmetric to the right. No other abnormalities are seen within the pelvis. Delayed images through the upper kidney demonstrate no filling defects in the renal collecting system. IMPRESSION: 1. The findings are consistent with a perforated appendicitis. The appearance is somewhat unusual with lack of enhancement at the distal tip and discontinuity of the mucosa. While this could represent a typical appendicitis, appendicitis due to a mass at the distal tip is not completely excluded. Recommend correlation with pathology. 2. Striated nephrogram on the left with no adjacent stranding. This is an age-indeterminate finding but pyelonephritis is not  excluded given the history of left-sided pain. Recommend correlation with urinalysis and clinical exam. 3. 12 mm enhancing nodule in the right adrenal gland. Recommend MRI for further characterization. 4. Probable developing small-bowel ileus. 5. Multiple cysts in the left ovary with the largest measuring up to 2.5 cm. This is a somewhat unusual  appearance in a postmenopausal woman. Recommend correlation with ultrasound for better assessment of cyst character and size. Findings called to Dr. Laneta Simmers. Electronically Signed   By: Dorise Bullion III M.D   On: 01/24/2016 14:46   Dg Abd Acute W/chest  01/24/2016  CLINICAL DATA:  RIGHT lower quadrant pain of unspecified duration EXAM: DG ABDOMEN ACUTE W/ 1V CHEST COMPARISON:  Chest radiograph 12/08/2010 FINDINGS: Enlargement of cardiac silhouette. Atherosclerotic calcification aorta. Mediastinal contours and pulmonary vascularity otherwise normal for technique. Suspected RIGHT nipple shadow, with LEFT nipple shadow projecting external to the costal margin. No definite acute infiltrate, pleural effusion or pneumothorax. Minimal subsegmental atelectasis at LEFT base. Prominent small bowel loops in mid abdomen with lesser degrees of colonic gas are seen scattered throughout the colon. No definite bowel wall thickening or free intraperitoneal air. Bones demineralized with degenerative changes lumbar spine. No urinary tract calcification. IMPRESSION: Enlargement of cardiac silhouette with minimal LEFT basilar atelectasis. Air-filled small bowel loops in the mid abdomen prominent in number and size though some gas is seen throughout the colon ; this could represent mild small bowel ileus but early versus partial small-bowel obstruction not completely excluded. Electronically Signed   By: Lavonia Dana M.D.   On: 01/24/2016 13:04    Review of Systems  Unable to perform ROS: language   Blood pressure 129/69, pulse 62, temperature 100.3 F (37.9 C), temperature source Oral, resp. rate 16, SpO2 97 %. Physical Exam  Constitutional: She is oriented to person, place, and time. She appears well-developed and well-nourished. No distress.  HENT:  Head: Normocephalic and atraumatic.  Nose: Nose normal.  Eyes: Right eye exhibits no discharge. Left eye exhibits no discharge. No scleral icterus.  Neck: Normal range of  motion. Neck supple. No JVD present. No tracheal deviation present. No thyromegaly present.  Cardiovascular: Normal rate, regular rhythm, normal heart sounds and intact distal pulses.   No murmur heard. Respiratory: Effort normal and breath sounds normal. No respiratory distress. She has no wheezes. She has no rales. She exhibits no tenderness.  GI: Soft. She exhibits no distension and no mass. There is tenderness. There is no rebound and no guarding.  Few BS, not really distended.  Tender lower abdomen, more on the left than the right.    Musculoskeletal: She exhibits no edema or tenderness.  Lymphadenopathy:    She has no cervical adenopathy.  Neurological: She is alert and oriented to person, place, and time. No cranial nerve deficit.  Skin: Skin is warm and dry. No rash noted. She is not diaphoretic. No erythema. No pallor.  Psychiatric: She has a normal mood and affect. Her behavior is normal. Judgment and thought content normal.  She was able to dial her granddaughter in [place] to translate.    Assessment/Plan: Perforated appendix vs possible appendiceal mass, vs diverticulitis AF on chronic coumadin No compliance out of some meds for 2 weeks. Hx of CVA Tobacco use  Plan:  We would recommend making her NPO, start her on Zosyn, IV fluids and reevaluate in the AM.  It doesn't sound like an appendicitis and review of the CT shows an abnormal appendix, but we are not sure it  is a ruptured appendix. It may be some form or diverticulitis.  We will see and follow with you in the AM.  Medical management of her issues.  Jimmy Stipes 01/24/2016, 3:28 PM

## 2016-01-24 NOTE — ED Notes (Signed)
Attempted report to 3S. 

## 2016-01-24 NOTE — Progress Notes (Signed)
Pharmacy Antibiotic Note  Linda [name] is a 75 y.o. female admitted on 01/24/2016 with intra-abdominal infxn.  Pharmacy has been consulted for zosyn dosing. Tmax is 100.3 and WBC is elevated at 16.3. SCr is WNL and lactic acid is 1.93.   Plan: - Zosyn 3.375gm IV Q8H (4 hr inf) - F/u renal fxn, C&S, clinical status     Temp (24hrs), Avg:100.3 F (37.9 C), Min:100.3 F (37.9 C), Max:100.3 F (37.9 C)   Recent Labs Lab 01/24/16 1135 01/24/16 1149  WBC 16.3*  --   CREATININE 0.79  --   LATICACIDVEN  --  1.93    CrCl cannot be calculated (Unknown ideal weight.).    No Known Allergies  Antimicrobials this admission: Zosyn 6/5>>  Dose adjustments this admission: N/A  Microbiology results: Pending  Thank you for allowing pharmacy to be a part of this patient's care.  Jonn Chaikin, Drake LeachRachel Lynn 01/24/2016 2:58 PM

## 2016-01-24 NOTE — Progress Notes (Signed)
Patient ID: Linda [name], female    DOB: Mar 23, 1941, 75 y.o.   MRN: [id number]  PCP: Nicki Reaper, NP  No chief complaint on file.   Subjective:  HPI I was called to the lobby urgently to attend to this patient who had fainted in the chair.  A nurse also waiting reported that she was holding her abdomen and then passed out, leaning over into the chair adjacent to her. No head trauma.  I found her moaning and alert, thought groggy appearing. She was able to tell me that she developed nausea and vomiting and abdominal pain yesterday. She was brought to the back in a wheelchair and transferred to the exam table.  Chart review reveals history of Afib, but last visit with cardiology was in 2015. Her medication list includes warfarin, but the only two bottle in her bag are empty for amlodipine and lipitor, which she states she has been out of x 20 days.  We attempted to contact the language line to help with translation, but were not successful prior to arrival of EMS.   Review of Systems Abdominal pain, nausea, vomiting. Unclear when her last BM was. States she urinates well.    Patient Active Problem List   Diagnosis Date Noted  . Left flank pain 08/27/2013  . Diastolic dysfunction 05/08/2011  . Smoker 05/08/2011  . Impaired glucose tolerance 05/08/2011  . Encounter for long-term (current) use of high-risk medication 05/08/2011  . Hypertension 03/27/2011  . Hyperlipidemia 03/27/2011  . Rash 03/27/2011  . Atrial fibrillation ([facility]) 12/16/2010  . CVA (cerebral vascular accident) ([facility]) 12/16/2010    No Known Allergies  Prior to Admission medications   Medication Sig Start Date End Date Taking? Authorizing Provider  amLODipine (NORVASC) 10 MG tablet TAKE 1 TABLET BY MOUTH EVERY DAY    Duke Salvia, MD  atorvastatin (LIPITOR) 20 MG tablet TAKE 1 TABLET BY MOUTH EVERY DAY    Duke Salvia, MD  clobetasol cream (TEMOVATE) 0.05 % Apply 1 application topically 3 (three) times  daily.    Historical Provider, MD  lisinopril (PRINIVIL,ZESTRIL) 10 MG tablet TAKE 1 TABLET BY MOUTH EVERY DAY 05/11/14   Duke Salvia, MD  warfarin (COUMADIN) 5 MG tablet TAKE AS DIRECTED BY COUMADIN CLINIC 03/25/14   Duke Salvia, MD     Past Medical, Surgical Family and Social History reviewed.        Objective:  Physical Exam  Constitutional: She is oriented to person, place, and time. She appears well-developed and well-nourished. She is active and cooperative. She appears distressed.  BP 171/110 mmHg  Pulse 74  Resp 18  SpO2 99%   HENT:  Head: Normocephalic and atraumatic.  Right Ear: Hearing normal.  Left Ear: Hearing normal.  Eyes: Conjunctivae are normal. No scleral icterus.  Neck: Normal range of motion. Neck supple. No thyromegaly present.  Cardiovascular: Normal rate, regular rhythm and normal heart sounds.   Pulses:      Radial pulses are 2+ on the right side, and 2+ on the left side.       Dorsalis pedis pulses are 2+ on the right side, and 2+ on the left side.  Pulmonary/Chest: Effort normal and breath sounds normal.  Abdominal: Normal appearance. Bowel sounds are increased. There is hepatosplenomegaly. There is generalized tenderness (she locates her pain in the epigastrum primarily, but then generally. Seems most tender in the epigastrum and LLQ). There is guarding (diffusely).  Lymphadenopathy:  Head (right side): No tonsillar, no preauricular, no posterior auricular and no occipital adenopathy present.       Head (left side): No tonsillar, no preauricular, no posterior auricular and no occipital adenopathy present.    She has no cervical adenopathy.       Right: No supraclavicular adenopathy present.       Left: No supraclavicular adenopathy present.  Neurological: She is alert and oriented to person, place, and time. No sensory deficit.  Skin: Skin is warm and intact. No rash noted. She is diaphoretic. No cyanosis or erythema. Nails show no clubbing.    Psychiatric: She has a normal mood and affect. Her speech is normal and behavior is normal.    EKG reviewed with Dr. Katrinka BlazingSmith. There are T-wave changes from previous tracing.       Assessment & Plan:  1. Syncope and collapse 2. Generalized abdominal pain Ekg changes are concerning for new cardiac event as the cause of her symptoms, though cannot exclude acute abdomen. IV LEFT wrist. O2 via [place]. EMS arrived for transport. - EKG 12-Lead     Fernande Brashelle S. Dayvian Blixt, PA-C Physician Assistant-Certified [facility]

## 2016-01-25 ENCOUNTER — Inpatient Hospital Stay (HOSPITAL_COMMUNITY): Payer: Medicare HMO

## 2016-01-25 ENCOUNTER — Other Ambulatory Visit: Payer: Self-pay

## 2016-01-25 DIAGNOSIS — R10819 Abdominal tenderness, unspecified site: Secondary | ICD-10-CM

## 2016-01-25 DIAGNOSIS — R55 Syncope and collapse: Secondary | ICD-10-CM

## 2016-01-25 DIAGNOSIS — N83202 Unspecified ovarian cyst, left side: Secondary | ICD-10-CM

## 2016-01-25 DIAGNOSIS — R1084 Generalized abdominal pain: Secondary | ICD-10-CM | POA: Diagnosis present

## 2016-01-25 LAB — ECHOCARDIOGRAM COMPLETE
AVPHT: 326 ms
CHL CUP STROKE VOLUME: 46 mL
E/e' ratio: 15.37
EWDT: 257 ms
FS: 38 % (ref 28–44)
HEIGHTINCHES: 60 in
IVS/LV PW RATIO, ED: 0.99
LA ID, A-P, ES: 27 mm
LA vol A4C: 51.8 ml
LA vol index: 33.6 mL/m2
LADIAMINDEX: 1.78 cm/m2
LAVOL: 51 mL
LEFT ATRIUM END SYS DIAM: 27 mm
LV E/e' medial: 15.37
LV SIMPSON'S DISK: 68
LV TDI E'LATERAL: 6.09
LV sys vol index: 14 mL/m2
LVDIAVOL: 68 mL (ref 46–106)
LVDIAVOLIN: 45 mL/m2
LVEEAVG: 15.37
LVELAT: 6.09 cm/s
LVOT area: 2.54 cm2
LVOT diameter: 18 mm
LVSYSVOL: 22 mL (ref 14–42)
MV Dec: 257
MV pk E vel: 93.6 m/s
MVPG: 4 mmHg
MVPKAVEL: 102 m/s
PW: 8.17 mm — AB (ref 0.6–1.1)
RV TAPSE: 28.1 mm
TDI e' medial: 6.09
Weight: 1964.74 oz

## 2016-01-25 LAB — BASIC METABOLIC PANEL
Anion gap: 11 (ref 5–15)
BUN: 8 mg/dL (ref 6–20)
CALCIUM: 7.9 mg/dL — AB (ref 8.9–10.3)
CO2: 20 mmol/L — ABNORMAL LOW (ref 22–32)
Chloride: 107 mmol/L (ref 101–111)
Creatinine, Ser: 0.84 mg/dL (ref 0.44–1.00)
GFR calc Af Amer: >60 mL/min (ref >60)
GLUCOSE: 90 mg/dL (ref 65–99)
Potassium: 3 mmol/L — ABNORMAL LOW (ref 3.5–5.1)
SODIUM: 138 mmol/L (ref 135–145)

## 2016-01-25 LAB — MAGNESIUM: MAGNESIUM: 1.8 mg/dL (ref 1.7–2.4)

## 2016-01-25 LAB — COMPREHENSIVE METABOLIC PANEL
ALT: 26 U/L (ref 14–54)
AST: 18 U/L (ref 15–41)
Albumin: 2.9 g/dL — ABNORMAL LOW (ref 3.5–5.0)
Alkaline Phosphatase: 65 U/L (ref 38–126)
Anion gap: 11 (ref 5–15)
BUN: 8 mg/dL (ref 6–20)
CHLORIDE: 108 mmol/L (ref 101–111)
CO2: 21 mmol/L — AB (ref 22–32)
Calcium: 8.1 mg/dL — ABNORMAL LOW (ref 8.9–10.3)
Creatinine, Ser: 0.84 mg/dL (ref 0.44–1.00)
Glucose, Bld: 98 mg/dL (ref 65–99)
POTASSIUM: 2.9 mmol/L — AB (ref 3.5–5.1)
SODIUM: 140 mmol/L (ref 135–145)
Total Bilirubin: 1.8 mg/dL — ABNORMAL HIGH (ref 0.3–1.2)
Total Protein: 6.1 g/dL — ABNORMAL LOW (ref 6.5–8.1)

## 2016-01-25 LAB — CBC
HCT: 34 % — ABNORMAL LOW (ref 36.0–46.0)
Hemoglobin: 11.1 g/dL — ABNORMAL LOW (ref 12.0–15.0)
MCH: 28.7 pg (ref 26.0–34.0)
MCHC: 32.6 g/dL (ref 30.0–36.0)
MCV: 87.9 fL (ref 78.0–100.0)
PLATELETS: 210 10*3/uL (ref 150–400)
RBC: 3.87 MIL/uL (ref 3.87–5.11)
RDW: 12.9 % (ref 11.5–15.5)
WBC: 15.3 10*3/uL — AB (ref 4.0–10.5)

## 2016-01-25 LAB — PROTIME-INR
INR: 1.22 (ref 0.00–1.49)
PROTHROMBIN TIME: 15.5 s — AB (ref 11.6–15.2)

## 2016-01-25 LAB — MRSA PCR SCREENING: MRSA BY PCR: NEGATIVE

## 2016-01-25 MED ORDER — KCL IN DEXTROSE-NACL 40-5-0.9 MEQ/L-%-% IV SOLN
INTRAVENOUS | Status: DC
  Administered 2016-01-25 – 2016-01-26 (×2): via INTRAVENOUS
  Filled 2016-01-25 (×3): qty 1000

## 2016-01-25 MED ORDER — POTASSIUM CHLORIDE CRYS ER 20 MEQ PO TBCR
40.0000 meq | EXTENDED_RELEASE_TABLET | Freq: Three times a day (TID) | ORAL | Status: AC
Start: 2016-01-25 — End: 2016-01-27
  Administered 2016-01-25 – 2016-01-27 (×6): 40 meq via ORAL
  Filled 2016-01-25 (×6): qty 2

## 2016-01-25 MED ORDER — POTASSIUM CHLORIDE IN NACL 40-0.9 MEQ/L-% IV SOLN
INTRAVENOUS | Status: DC
  Administered 2016-01-25: 100 mL/h via INTRAVENOUS
  Filled 2016-01-25 (×3): qty 1000

## 2016-01-25 MED ORDER — SODIUM CHLORIDE 0.9 % IV BOLUS (SEPSIS)
1000.0000 mL | Freq: Once | INTRAVENOUS | Status: AC
  Administered 2016-01-25: 1000 mL via INTRAVENOUS

## 2016-01-25 MED ORDER — POTASSIUM CHLORIDE 10 MEQ/100ML IV SOLN: 10.0000 meq | INTRAVENOUS | Status: DC

## 2016-01-25 MED ORDER — DILTIAZEM HCL 25 MG/5ML IV SOLN
5.0000 mg | Freq: Once | INTRAVENOUS | Status: AC
  Administered 2016-01-25: 5 mg via INTRAVENOUS
  Filled 2016-01-25: qty 5

## 2016-01-25 MED ORDER — POTASSIUM CHLORIDE 10 MEQ/100ML IV SOLN
10.0000 meq | INTRAVENOUS | Status: AC
  Administered 2016-01-25 (×4): 10 meq via INTRAVENOUS
  Filled 2016-01-25 (×3): qty 100

## 2016-01-25 NOTE — Progress Notes (Signed)
Patient ID: Linda [name], female   DOB: [birth year], 75 y.o.   MRN: [id number]    PROGRESS NOTE    Linda [name]  JNG:[id number] DOB: 1941-05-28 DOA: 01/24/2016  PCP: Nicki Reaper, NP   Brief Narrative:  75 y.o. female with HTN, HLD, Afib on Coumadin, Tobacco/COPD, h/o CVA, brought by EMS from urgent care facility due to loss of consciousness, abd pain with cramps, over 10 episodes of loose BM's. She thought she had food poisoning and was taking imodium but her symptoms have not improved.    In ED,  CT abdomen and pelvis notable for ? perforated appendicitis. Incidental 12 mm enhancing nodule in the right adrenal gland.Probable developing small-bowel ileus. White count 16.3. Lactic acid 0.9. Troponin 0.07. Zosyn started.   Assessment & Plan:  Acute abdominal pain without melena  - CT abdomen and pelvis shows findings consistent with ? perforated appendicitis  - per surgery, concern with ? mass at the base of the appendix/cecum area - GI consulted for consideration of colonoscopy prior to any surgical interventions  - keep NPO for now in case intervention or colonoscopy needed  - continue to provide IVF and analgesia as needed  - continue Zosyn day #2 as pt still with fever up to 100 F and leukocytosis - repeat CBC in AM to follow up on Hg   Sepsis - pt met criteria for sepsis on admission with T 100F, RR > 22, WBC 16 K, source ? Perforated appendicitis vs Left pyelonephritis as suggested per CT abd - continue Zosyn day #2 - follow up on blood cultures and urine cultures  - lactic acid is WNL   Syncope, grade II diastolic CHF - in a patient with a history of Afib, not on meds for at least 20 days.  - ECHO notable for stable EF 55 % with grade II diastolic CHF - no signs of volume overload on exam  - currently ambulating in hallway with PT and has no problems, fast and sturdy on her feet  - no further PT needs upon discharge   Atrial Fibrillation CHA2DS2-VASc score 5 - Patient  ranout all of her medications about 20 days ago - coumadin will be on hold in case surgical intervention needed  - troponin negative  - place on Heparin drip in the meantime   Hypokalemia  - supplement and repeat electrolytes in AM   Hyperlipidemia - Continue home statins  Hypertension, essential  - continue home medical regimen   Adrenal mass, ovarian cyst  -Incidental 12 mm enhancing nodule in the right adrenal gland seen on CT - may need MRI for further evaluation or pelvic US once major issue resolved   Tobacco Abuse - cessation consultation provided   DVT prophylaxis: SCD's Code Status: Full  Family Communication: Patient at bedside, used interpreter over the phone to answer pt's questions Disposition Plan: Home once cleared by consulting teams, surgery and GI   Consultants:   Surgery   GI  Procedures:   None  Antimicrobials:  Zosyn 6/5 -->  Subjective: No pain this AM. Would like to eat more.   Objective: Filed Vitals:   01/24/16 2302 01/24/16 2303 01/25/16 0323 01/25/16 0326  BP:  157/73 108/84   Pulse:  85 81   Temp: 100 F (37.8 C)   100.2 F (37.9 C)  TempSrc: Oral   Oral  Resp:  26 19   Height:      Weight:      SpO2:  93% 92%  Intake/Output Summary (Last 24 hours) at 01/25/16 0638 Last data filed at 01/25/16 0506  Gross per 24 hour  Intake 729.33 ml  Output    275 ml  Net 454.33 ml   Filed Weights   01/24/16 1956  Weight: 55.7 kg (122 lb 12.7 oz)    Examination:  General exam: Appears calm and comfortable  Respiratory system: Clear to auscultation. Respiratory effort normal. Cardiovascular system: S1 & S2 heard, RRR. No JVD, murmurs, rubs, gallops or clicks. No pedal edema. Gastrointestinal system: Abdomen is nondistended, soft and nontender.  Central nervous system: Alert and oriented. No focal neurological deficits. Extremities: Symmetric 5 x 5 power.   Data Reviewed: I have personally reviewed following labs and  imaging studies  CBC:  Recent Labs Lab 01/24/16 1135 01/25/16 0413  WBC 16.3* 15.3*  NEUTROABS 13.1*  --   HGB 13.0 11.1*  HCT 38.7 34.0*  MCV 87.6 87.9  PLT 232 213   Basic Metabolic Panel:  Recent Labs Lab 01/24/16 1135 01/25/16 0413  NA 139 140  K 3.6 2.9*  CL 110 108  CO2 22 21*  GLUCOSE 136* 98  BUN 10 8  CREATININE 0.79 0.84  CALCIUM 8.3* 8.1*   Liver Function Tests:  Recent Labs Lab 01/24/16 1135 01/25/16 0413  AST 21 18  ALT 28 26  ALKPHOS 67 65  BILITOT 1.4* 1.8*  PROT 6.4* 6.1*  ALBUMIN 3.4* 2.9*    Recent Labs Lab 01/24/16 1135  LIPASE 20   Coagulation Profile:  Recent Labs Lab 01/24/16 1549 01/25/16 0413  INR 1.39 1.22   CBG:  Recent Labs Lab 01/24/16 1136  GLUCAP 133*   Urine analysis:    Component Value Date/Time   COLORURINE AMBER* 01/24/2016 1940   APPEARANCEUR CLEAR 01/24/2016 1940   LABSPEC 1.020 01/24/2016 1940   PHURINE 5.5 01/24/2016 1940   GLUCOSEU NEGATIVE 01/24/2016 1940   HGBUR NEGATIVE 01/24/2016 1940   BILIRUBINUR SMALL* 01/24/2016 1940   KETONESUR 15* 01/24/2016 1940   PROTEINUR 30* 01/24/2016 1940   NITRITE NEGATIVE 01/24/2016 1940   LEUKOCYTESUR NEGATIVE 01/24/2016 1940   Recent Results (from the past 240 hour(s))  MRSA PCR Screening     Status: None   Collection Time: 01/24/16  8:00 PM  Result Value Ref Range Status   MRSA by PCR NEGATIVE NEGATIVE Final    Radiology Studies: Ct Abdomen Pelvis W Contrast 01/24/2016  The findings are consistent with a perforated appendicitis. The appearance is somewhat unusual with lack of enhancement at the distal tip and discontinuity of the mucosa. While this could represent a typical appendicitis, appendicitis due to a mass at the distal tip is not completely excluded. Recommend correlation with pathology. 2. Striated nephrogram on the left with no adjacent stranding. This is an age-indeterminate finding but pyelonephritis is not excluded given the history of  left-sided pain. Recommend correlation with urinalysis and clinical exam. 3. 12 mm enhancing nodule in the right adrenal gland. Recommend MRI for further characterization. 4. Probable developing small-bowel ileus. 5. Multiple cysts in the left ovary with the largest measuring up to 2.5 cm. This is a somewhat unusual appearance in a postmenopausal woman. Recommend correlation with ultrasound for better assessment of cyst character and size.   Dg Abd Acute W/chest 01/24/2016 Enlargement of cardiac silhouette with minimal LEFT basilar atelectasis. Air-filled small bowel loops in the mid abdomen prominent in number and size though some gas is seen throughout the colon ; this could represent mild small bowel ileus but early  versus partial small-bowel obstruction not completely excluded.   Scheduled Meds: . amLODipine  10 mg Oral Daily  . atorvastatin  20 mg Oral Daily  . lisinopril  10 mg Oral Daily  . nicotine  14 mg Transdermal Daily  . piperacillin-tazobactam (ZOSYN)  IV  3.375 g Intravenous Q8H  . potassium chloride  10 mEq Intravenous Q1 Hr x 4  . sodium chloride flush  3 mL Intravenous Q12H   Continuous Infusions: . sodium chloride 100 mL/hr at 01/25/16 0500    LOS: 1 day   Time spent: 20 minutes   Faye Ramsay, MD [facility] Pager [phone]  If 7PM-7AM, please contact night-coverage [email or web address] Password [facility] 01/25/2016, 6:38 AM

## 2016-01-25 NOTE — Progress Notes (Signed)
Subjective: She says she feels better this AM.  She still has pain both lower left and right sides of her abdomen.  The left side hurts worse than the right.  Pain worse with palpation.   Info gain thru phone interpreter [phone].    Objective: Vital signs in last 24 hours: Temp:  [97.6 F (36.4 C)-100.3 F (37.9 C)] 98.4 F (36.9 C) (06/06 0732) Pulse Rate:  [58-85] 72 (06/06 0732) Resp:  [15-35] 18 (06/06 0732) BP: (108-171)/(62-110) 143/65 mmHg (06/06 0732) SpO2:  [92 %-99 %] 97 % (06/06 0732) Weight:  [55.7 kg (122 lb 12.7 oz)] 55.7 kg (122 lb 12.7 oz) (06/05 1956) Last BM Date: 01/22/16   NPO Urine 275 Low grade temp last PM till this AM VSS K+ is low check mag and replace she is getting some runs now.   EKG shows SR with inferior and lateral T wave inversion Intake/Output from previous day: 06/05 0701 - 06/06 0700 In: 729.3 [I.V.:629.3; IV Piggyback:100] Out: 275 [Urine:275] Intake/Output this shift: Total I/O In: 100 [IV Piggyback:100] Out: -   General appearance: alert, cooperative and no distress Resp: clear to auscultation bilaterally GI: pain lower abdomen, left side more severe than the right.  Few BS, no distension.  Lab Results:   Recent Labs  01/24/16 1135 01/25/16 0413  WBC 16.3* 15.3*  HGB 13.0 11.1*  HCT 38.7 34.0*  PLT 232 210    BMET  Recent Labs  01/25/16 0413 01/25/16 0724  NA 140 138  K 2.9* 3.0*  CL 108 107  CO2 21* 20*  GLUCOSE 98 90  BUN 8 8  CREATININE 0.84 0.84  CALCIUM 8.1* 7.9*   PT/INR  Recent Labs  01/24/16 1549 01/25/16 0413  LABPROT 17.2* 15.5*  INR 1.39 1.22     Recent Labs Lab 01/24/16 1135 01/25/16 0413  AST 21 18  ALT 28 26  ALKPHOS 67 65  BILITOT 1.4* 1.8*  PROT 6.4* 6.1*  ALBUMIN 3.4* 2.9*     Lipase     Component Value Date/Time   LIPASE 20 01/24/2016 1135     Studies/Results: X-ray Chest Pa And Lateral  01/25/2016  CLINICAL DATA:  Hypertension. Preoperative cardiovascular  evaluation EXAM: CHEST  2 VIEW COMPARISON:  January 24, 2016 FINDINGS: There is persistent minimal left base atelectasis. Lungs elsewhere clear. Heart is borderline enlarged with pulmonary vascularity within normal limits. No adenopathy. No bone lesions. IMPRESSION: Minimal left base atelectasis. No edema or consolidation. Heart borderline prominent but stable. No new opacity. Electronically Signed   By: Bretta Bang III M.D.   On: 01/25/2016 07:31   Ct Abdomen Pelvis W Contrast  01/24/2016  CLINICAL DATA:  Left lower quadrant abdominal pain. EXAM: CT ABDOMEN AND PELVIS WITH CONTRAST TECHNIQUE: Multidetector CT imaging of the abdomen and pelvis was performed using the standard protocol following bolus administration of intravenous contrast. CONTRAST:  ISOVUE-300 IOPAMIDOL (ISOVUE-300) INJECTION 61% COMPARISON:  None. FINDINGS: Normal normal lung bases. The appendix is abnormal in appearance at its distal tip. The proximal 90% of the appendix enhances normally. However, there is decreased enhancement at the distal tip with some adjacent stranding and discontinuity of mucosal enhancement. The distal appendix is also mildly dilated measuring between 8 or 9 mm. There are a few dots of free air adjacent to the distal tip is well. No other free air identified. There is a small amount of free fluid in the pelvis on the right. Cholelithiasis is identified. The liver, portal veins, and  spleen are normal. The left adrenal gland is normal. There is an enhancing nodule in the right adrenal gland measuring 12 mm. Mild prominence of the pancreatic duct with no obstructing mass or cause identified. The common bile duct is normal in caliber. The pancreas is otherwise normal. A few small renal cysts are seen on the left. A few are too small to characterize. No suspicious renal masses. No ureterectasis or ureteral stones. There are regions of decreased cortical enhancement on the left with no adjacent stranding. This becomes  more prominent on delayed images. No filling defects in the upper renal collecting systems. The stomach is normal. There are multiple prominent mildly dilated loops of small bowel with intervening loops of normal caliber small bowel. Given the findings in the right lower quadrant, this is favored to represent developing ileus. The colon is unremarkable. There is a non aneurysmal atherosclerotic aorta. No adenopathy. No adenopathy or suspicious mass in the pelvis. The uterus and right ovary are normal. There appear to be several cysts in the left ovary with the largest measuring up to 2.5 cm. This is asymmetric to the right. No other abnormalities are seen within the pelvis. Delayed images through the upper kidney demonstrate no filling defects in the renal collecting system. IMPRESSION: 1. The findings are consistent with a perforated appendicitis. The appearance is somewhat unusual with lack of enhancement at the distal tip and discontinuity of the mucosa. While this could represent a typical appendicitis, appendicitis due to a mass at the distal tip is not completely excluded. Recommend correlation with pathology. 2. Striated nephrogram on the left with no adjacent stranding. This is an age-indeterminate finding but pyelonephritis is not excluded given the history of left-sided pain. Recommend correlation with urinalysis and clinical exam. 3. 12 mm enhancing nodule in the right adrenal gland. Recommend MRI for further characterization. 4. Probable developing small-bowel ileus. 5. Multiple cysts in the left ovary with the largest measuring up to 2.5 cm. This is a somewhat unusual appearance in a postmenopausal woman. Recommend correlation with ultrasound for better assessment of cyst character and size. Findings called to Dr. Clydene Pugh. Electronically Signed   By: Gerome Sam III M.D   On: 01/24/2016 14:46   Dg Abd Acute W/chest  01/24/2016  CLINICAL DATA:  RIGHT lower quadrant pain of unspecified duration EXAM:  DG ABDOMEN ACUTE W/ 1V CHEST COMPARISON:  Chest radiograph 12/08/2010 FINDINGS: Enlargement of cardiac silhouette. Atherosclerotic calcification aorta. Mediastinal contours and pulmonary vascularity otherwise normal for technique. Suspected RIGHT nipple shadow, with LEFT nipple shadow projecting external to the costal margin. No definite acute infiltrate, pleural effusion or pneumothorax. Minimal subsegmental atelectasis at LEFT base. Prominent small bowel loops in mid abdomen with lesser degrees of colonic gas are seen scattered throughout the colon. No definite bowel wall thickening or free intraperitoneal air. Bones demineralized with degenerative changes lumbar spine. No urinary tract calcification. IMPRESSION: Enlargement of cardiac silhouette with minimal LEFT basilar atelectasis. Air-filled small bowel loops in the mid abdomen prominent in number and size though some gas is seen throughout the colon ; this could represent mild small bowel ileus but early versus partial small-bowel obstruction not completely excluded. Electronically Signed   By: Ulyses Southward M.D.   On: 01/24/2016 13:04    Medications: . amLODipine  10 mg Oral Daily  . atorvastatin  20 mg Oral Daily  . lisinopril  10 mg Oral Daily  . nicotine  14 mg Transdermal Daily  . piperacillin-tazobactam (ZOSYN)  IV  3.375  g Intravenous Q8H  . potassium chloride  10 mEq Intravenous Q1 Hr x 4  . sodium chloride flush  3 mL Intravenous Q12H   . sodium chloride 100 mL/hr at 01/25/16 0500    Assessment/Plan Perforated appendix vs possible appendiceal mass, vs diverticulitis AF on chronic coumadin Abnormal EKG No compliance out of some meds for 2 weeks. Hx of CVA Tobacco use FEN:  NPO/IV fluids ID:  Day 2 Zosyn DVT:  SCD/add heparin   Plan:  Dr. Luisa Hartornett has reviewed the CT and he is concerned she has some kind of mass at the base of the appendix/cecum area. She has never had a colonoscopy.  We are going to continue antibiotics and ask  GI to see also.  Possible colonoscopy.     LOS: 1 day    Melessia Kaus 01/25/2016 [phone]

## 2016-01-25 NOTE — Progress Notes (Signed)
[facility] notified[facility]

## 2016-01-25 NOTE — Progress Notes (Signed)
  Echocardiogram 2D Echocardiogram has been performed.  Marisue Humblelexis N Linda [name] 01/25/2016, 8:19 AM

## 2016-01-25 NOTE — Evaluation (Signed)
Physical Therapy Evaluation/Discharge Patient Details Name: Linda [name] MRN: [id number] DOB: 22-Sep-1940 Today's Date: 01/25/2016   History of Present Illness  75 y.o. [facility] for abdominal pain.  Pt diff dx of performated appendix vs. appendiceal mass vs. diverticulitis.  GI being consulted for possible colonoscopy for 01/25/16.  Pt with significant PMHx of HTN, CVA, A-fib, and CAD.  Clinical Impression  Pt did well ambulating in the hallway.  She self reports feeling as strong as her baseline and did not need physical assist to walk (other than managing her lines).  Acute PT is not currently needed, however, if pt ends up having a more invasive surgery as her differential dx plays out, please re consult us.  [place] interpreter # [phone] used throughout session.        Follow Up Recommendations No PT follow up    Equipment Recommendations  None recommended by PT    Recommendations for Other Services   NA    Precautions / Restrictions Precautions Precautions: None      Mobility  Bed Mobility Overal bed mobility: Independent                Transfers Overall transfer level: Independent                  Ambulation/Gait Ambulation/Gait assistance: Independent Ambulation Distance (Feet): 600 Feet Assistive device: None Gait Pattern/deviations: WFL(Within Functional Limits)   Gait velocity interpretation: at or above normal speed for age/gender           Balance Overall balance assessment: No apparent balance deficits (not formally assessed)                                           Pertinent Vitals/Pain Pain Assessment: 0-10 Pain Score: 2  Pain Location: abdomen before session, 0/10 abdomen after walking.  Pain Descriptors / Indicators: Aching Pain Intervention(s): Limited activity within patient's tolerance;Monitored during session;Repositioned    01/25/16 1257  Vital Signs  Patient Position (if  appropriate) Orthostatic Vitals  Orthostatic Lying   BP- Lying 108/55 mmHg  Orthostatic Sitting  BP- Sitting 116/65 mmHg  Orthostatic Standing at 0 minutes  BP- Standing at 0 minutes 112/57 mmHg  Orthostatic Standing at 3 minutes  BP- Standing at 3 minutes 116/58 mmHg    Home Living Family/patient expects to be discharged to:: Private residence Living Arrangements: Children;Other relatives (grandchildren) Available Help at Discharge: Family;Available 24 hours/day Type of Home: House Home Access: Level entry     Home Layout: Two level Home Equipment: None Additional Comments: Pt lives on main level, but kitchen is on upstairs level, so she does have to take the stairs to get to the kitchen.     Prior Function Level of Independence: Independent         Comments: pt was independent in ADLs and gait PTA        Extremity/Trunk Assessment   Upper Extremity Assessment: Overall WFL for tasks assessed           Lower Extremity Assessment: Overall WFL for tasks assessed      Cervical / Trunk Assessment: Normal  Communication   Communication: Prefers language other than English Susie Cassette([place]iat[place]ese)  Cognition Arousal/Alertness: Awake/alert Behavior During Therapy: WFL for tasks assessed/performed Overall Cognitive Status: Within Functional Limits for tasks assessed  General Comments General comments (skin integrity, edema, etc.): Pt reports abdominal pain is less with walking today compared to yesterday          Assessment/Plan    PT Assessment Patent does not need any further PT services  PT Diagnosis Generalized weakness;Acute pain         PT Goals (Current goals can be found in the Care Plan section) Acute Rehab PT Goals Patient Stated Goal: none stated PT Goal Formulation: All assessment and education complete, DC therapy               End of Session Equipment Utilized During Treatment: Gait belt Activity Tolerance: Patient  tolerated treatment well Patient left: in chair;with call bell/phone within reach Nurse Communication: Mobility status         Time: 1202-1241 PT Time Calculation (min) (ACUTE ONLY): 39 min   Charges:   PT Evaluation $PT Eval Moderate Complexity: 1 Procedure PT Treatments $Gait Training: 8-22 mins $Therapeutic Activity: 8-22 mins        Kylen Schliep B. Zayvian Mcmurtry, PT, DPT [phone]   01/25/2016, 12:55 PM

## 2016-01-25 NOTE — Consult Note (Signed)
[facility] Consult: 11:55 AM 01/25/2016  LOS: 1 day    Referring Provider: Dr Luisa Hart  Primary Care Physician:  Nicki Reaper, NP Primary Gastroenterologist:  unassigned    IMPRESSION:   *  Appendicitis vs cecal mass.  Pt with acute onset abd pain, n/v, diarrhea over last few days.  Fever and leukocytosis at arrival.  Sxs/fever/WBCs improved day 2 Zosyn.     *  Prominent pancreatic duct without mass, normal CBD and uncomplicated gallstones per CT.  t Bili minimally elevated.   *  Left ovarian cysts in post menopausal woman.   *  Right adrenal nodule.   *  Hx Afib and CVA.  Chronic Coumadin on hold.   *  Syncope.  Troponin I normal x 1.  EKG with T wave inversion in inferiolateral leads. Marland Kitchen   PLAN:     *  Per Dr Leone Payor.  *  At some point, should she have a pelvic ultrasound for ovarian cysts eval and MRI for the adrenal nodule?    [facility] Attending   I have taken an interval history, reviewed the chart and examined the patient. I agree with the Advanced Practitioner's note, impression and recommendations.     Unclear situation with appendiceal abnormality, left ovary cysts in an elderly woman, adrenal nodule, pain LLQ but I find tenderness there and LLQ. Has severe hypokalemia also.  My plan is: 1) Replete K some more - oral and IV - colon prep would worsen this 2) Changed IVF to D%NS - she is NPO and needs glucose for the brain 3) I recommend an MRI abd/pelvis w/o and with contrast - will need interpreter to explain and try to get proper breath holding - if this does not seem like it will work then do pelvic US 4) colonoscopy pending the above results  Iva Boop, MD, [facility] [phone] (pager) [phone] after 5 PM, weekends and holidays  01/25/2016  5:29 PM  Reason for Consultation:  ? cecal mass vs appendicitis.    HPI: Linda [name] is a 75 y.o. female.  [place] who speaks little if any [place].  Interviewed easily via Radio producer.  Hx htn, ran out of BP meds ~ 20 days ago.  On Coumadin for Afib.  11/2010 CVA.  COPD.  EF 60 to 65%, mild to moderate multi-valvular regurge, grade 2 diastolic dysfunction per 01/25/16 echo.  No prior surgeries.    Diarrhea and loose stools and abd cramping began 6/3, after eating a banana.  Improved with Imodium.  Sunday PM had more intense abdominal pain to 10/10.  She denies n/v.  Went to PMD office 6/4 where she was doubled over with pain and then had syncope. Not hypotensive or tachycardic. They called EMS.  No previous similar sxs.  Previously good appetite, no weight loss.  No previous colon/egd.  No BPR or melena.  No ETOH.  Occasional Advil use? (pt said she uses "Equil" for pain prn).    Brought to ED and fever to 100.3.  WBCs 16.3.  Hypokalemia.  Minor anemia (hgb 11.1) with normal MCV.  INR 1.2 today. t bili 1.8, albumin 2.9 but otherwise LFTs are normal. Lactic acid normal.  Pain is now ~ 2/2.  Located more in upper abdomen/epigastic area but moves down into lower mid abomen.   Abd xrays: prominent, air-filled loops of SB, ? Mild SB ileus versus PSBO.   CT abd/pelvis: gallstones. Normal CBD. Right adrenal nodule (suggest MRI to study) Mildly prominent pancreatic duct. Prominent loops of SB but with intervening normal appearing SB.  Decreased enhancement at distal tip of appendix with adjacent stranding. Distal appendix mildly dilated, a few points of free air adjacent to distal appendix. Left ovarian cysts, not normal for age (needs correlation with ultrasound).   Dr Luisa Hart, surgeon, reviewed CT and is concerned that appediceal finding represents a mass rather than appendicitis.  Since she has never undergone colonoscopy, he wonders if this could be performed.   Pt is day 2 Zosyn.  Pain persists but  now at 2-3/10.   WBCs improved, no fevers or BMs yet today.      Past Medical History  Diagnosis Date  . Hypertension   . Hyperlipidemia   . Tobacco abuse     Ongoing (3-6 cigarettes per day, 25 pack year history)  . History of CVA (cerebrovascular accident)     New diagnosis December 08, 2010  . Atrial fibrillation (HCC)     dx in setting of stroke 4/12;  echo with EF 60-65%, mild LVH, mild AI, grade 1 diast dysfxn  . Impaired glucose tolerance 05/08/2011  . Hx of cardiovascular stress test     a. Myoview 8/12:  EF 69%, no ischemia.   . Coronary artery disease     History reviewed. No pertinent past surgical history.  Prior to Admission medications   Medication Sig Start Date End Date Taking? Authorizing Provider  amLODipine (NORVASC) 10 MG tablet TAKE 1 TABLET BY MOUTH EVERY DAY   Yes Duke Salvia, MD  atorvastatin (LIPITOR) 20 MG tablet TAKE 1 TABLET BY MOUTH EVERY DAY   Yes Duke Salvia, MD  ibuprofen (ADVIL,MOTRIN) 200 MG tablet Take 200-400 mg by mouth every 6 (six) hours as needed for headache or mild pain.   Yes Historical Provider, MD  warfarin (COUMADIN) 5 MG tablet TAKE AS DIRECTED BY COUMADIN CLINIC 03/25/14   Duke Salvia, MD    Scheduled Meds: . amLODipine  10 mg Oral Daily  . atorvastatin  20 mg Oral Daily  . lisinopril  10 mg Oral Daily  . nicotine  14 mg Transdermal Daily  . piperacillin-tazobactam (ZOSYN)  IV  3.375 g Intravenous Q8H  . potassium chloride  10 mEq Intravenous Q1 Hr x 4  . sodium chloride flush  3 mL Intravenous Q12H   Infusions: . 0.9 % NaCl with KCl 40 mEq / L     PRN Meds: acetaminophen **OR** acetaminophen, bisacodyl, hydrALAZINE, HYDROcodone-acetaminophen, magnesium citrate, morphine injection, ondansetron **OR** ondansetron (ZOFRAN) IV, senna-docusate, traZODone   Allergies as of 01/24/2016  . (No Known Allergies)    Family History  Problem Relation Age of Onset  . Coronary artery disease Neg Hx     Social History   Social  History  . Marital Status: Widowed    Spouse Name: N/A  . Number of Children: N/A  . Years of Education: N/A   Occupational History  .  [employer]  . Not working    Social History Main Topics  . Smoking status: Light Tobacco  Smoker -- 1.00 packs/day for 25 years    Types: Cigarettes  . Smokeless tobacco: Not on file     Comment: 3-6 cigarettes per day  . Alcohol Use: No  . Drug Use: No  . Sexual Activity: Not on file   Other Topics Concern  . Not on file   Social History Narrative   Lives in [place] with her daughters   Regular diet   No regular exercise but active and independent with all daily activities    REVIEW OF SYSTEMS: Constitutional:  Weight up 4 # since 08/2014.  ENT:  No nose bleeds Pulm:  + cough with occasional non-purulent sputum.  CV:  No palpitations, no LE edema. No chest pain  GU:  No hematuria, no frequency GI:  Per HPI.  No dysphagia or heartburn.  No blood in stool Heme:  Never had issue with anemia or excessive bleeding or bruising   Transfusions:  None ever Neuro:  No headaches, no peripheral tingling or numbness.  Syncope as per HPI.  Derm:  Pruritic patchy rash on left inner wrist.  Endocrine:  No sweats or chills.  No polyuria or dysuria Immunization:  Up to date flu and pnvx Travel:  None beyond local counties in last few months.    PHYSICAL EXAM: Vital signs in last 24 hours: Filed Vitals:   01/25/16 0732 01/25/16 1118  BP: 143/65 107/56  Pulse: 72 63  Temp: 98.4 F (36.9 C)   Resp: 18 22   Wt Readings from Last 3 Encounters:  01/24/16 55.7 kg (122 lb 12.7 oz)  03/25/14 54.432 kg (120 lb)  08/27/13 53.524 kg (118 lb)    General: pleasant, comfortable, non-toxic appearing asian female.   Head:  No asymmetry or swelling  Eyes:  No icterus or pallor Ears:  nont HOH  Nose:  No discharge or congestion.   Mouth:  Clear, dentition fair.  No lesions.  Moist MM.  Tongue coated white (not candidial) Neck:  No JVD, no TMG.  No  mass Lungs:  Clear bil.  Slight cough.  No dyspnea Heart: RRR.   No MRG.  S1/S2 present Abdomen:  Soft, ND.  NT.  No mass.  No HSM.  No bruits.  .   Rectal: deferred   Musc/Skeltl: no joint contractures, swelling or erythema. Extremities:  No CCE  Neurologic:  Oriented x 3.  No gross deficits.  No tremor.  Moves all 4 limbs, strength not tested.  Skin:  No telangectasia.  psoriatic looking flaky patch of skin at medial left wrist.    Tattoos:  none Nodes:  No cervical adenopathy   Psych:  Cooperative, calm, pleasant.   Intake/Output from previous day: 06/05 0701 - 06/06 0700 In: 729.3 [I.V.:629.3; IV Piggyback:100] Out: 275 [Urine:275] Intake/Output this shift: Total I/O In: 400 [IV Piggyback:400] Out: -   LAB RESULTS:  Recent Labs  01/24/16 1135 01/25/16 0413  WBC 16.3* 15.3*  HGB 13.0 11.1*  HCT 38.7 34.0*  PLT 232 210   BMET Lab Results  Component Value Date   NA 138 01/25/2016   NA 140 01/25/2016   NA 139 01/24/2016   K 3.0* 01/25/2016   K 2.9* 01/25/2016   K 3.6 01/24/2016   CL 107 01/25/2016   CL 108 01/25/2016   CL 110 01/24/2016   CO2 20* 01/25/2016   CO2 21* 01/25/2016   CO2 22 01/24/2016   GLUCOSE 90 01/25/2016   GLUCOSE 98 01/25/2016   GLUCOSE 136* 01/24/2016  BUN 8 01/25/2016   BUN 8 01/25/2016   BUN 10 01/24/2016   CREATININE 0.84 01/25/2016   CREATININE 0.84 01/25/2016   CREATININE 0.79 01/24/2016   CALCIUM 7.9* 01/25/2016   CALCIUM 8.1* 01/25/2016   CALCIUM 8.3* 01/24/2016   LFT  Recent Labs  01/24/16 1135 01/25/16 0413  PROT 6.4* 6.1*  ALBUMIN 3.4* 2.9*  AST 21 18  ALT 28 26  ALKPHOS 67 65  BILITOT 1.4* 1.8*   PT/INR Lab Results  Component Value Date   INR 1.22 01/25/2016   INR 1.39 01/24/2016   INR 2.1 08/27/2013   Hepatitis Panel No results for input(s): HEPBSAG, HCVAB, HEPAIGM, HEPBIGM in the last 72 hours. C-Diff No components found for: CDIFF Lipase     Component Value Date/Time   LIPASE 20 01/24/2016 1135     Drugs of Abuse  No results found for: LABOPIA, COCAINSCRNUR, LABBENZ, AMPHETMU, THCU, LABBARB   RADIOLOGY STUDIES: X-ray Chest Pa And Lateral  01/25/2016  CLINICAL DATA:  Hypertension. Preoperative cardiovascular evaluation EXAM: CHEST  2 VIEW COMPARISON:  January 24, 2016 FINDINGS: There is persistent minimal left base atelectasis. Lungs elsewhere clear. Heart is borderline enlarged with pulmonary vascularity within normal limits. No adenopathy. No bone lesions. IMPRESSION: Minimal left base atelectasis. No edema or consolidation. Heart borderline prominent but stable. No new opacity. Electronically Signed   By: Bretta BangWilliam  Woodruff III M.D.   On: 01/25/2016 07:31   Ct Abdomen Pelvis W Contrast  01/24/2016  CLINICAL DATA:  Left lower quadrant abdominal pain. EXAM: CT ABDOMEN AND PELVIS WITH CONTRAST TECHNIQUE: Multidetector CT imaging of the abdomen and pelvis was performed using the standard protocol following bolus administration of intravenous contrast. CONTRAST:  100mL ISOVUE-300 IOPAMIDOL (ISOVUE-300) INJECTION 61% COMPARISON:  None. FINDINGS: Normal normal lung bases. The appendix is abnormal in appearance at its distal tip. The proximal 90% of the appendix enhances normally. However, there is decreased enhancement at the distal tip with some adjacent stranding and discontinuity of mucosal enhancement. The distal appendix is also mildly dilated measuring between 8 or 9 mm. There are a few dots of free air adjacent to the distal tip is well. No other free air identified. There is a small amount of free fluid in the pelvis on the right. Cholelithiasis is identified. The liver, portal veins, and spleen are normal. The left adrenal gland is normal. There is an enhancing nodule in the right adrenal gland measuring 12 mm. Mild prominence of the pancreatic duct with no obstructing mass or cause identified. The common bile duct is normal in caliber. The pancreas is otherwise normal. A few small renal cysts are  seen on the left. A few are too small to characterize. No suspicious renal masses. No ureterectasis or ureteral stones. There are regions of decreased cortical enhancement on the left with no adjacent stranding. This becomes more prominent on delayed images. No filling defects in the upper renal collecting systems. The stomach is normal. There are multiple prominent mildly dilated loops of small bowel with intervening loops of normal caliber small bowel. Given the findings in the right lower quadrant, this is favored to represent developing ileus. The colon is unremarkable. There is a non aneurysmal atherosclerotic aorta. No adenopathy. No adenopathy or suspicious mass in the pelvis. The uterus and right ovary are normal. There appear to be several cysts in the left ovary with the largest measuring up to 2.5 cm. This is asymmetric to the right. No other abnormalities are seen within the pelvis. Delayed  images through the upper kidney demonstrate no filling defects in the renal collecting system. IMPRESSION: 1. The findings are consistent with a perforated appendicitis. The appearance is somewhat unusual with lack of enhancement at the distal tip and discontinuity of the mucosa. While this could represent a typical appendicitis, appendicitis due to a mass at the distal tip is not completely excluded. Recommend correlation with pathology. 2. Striated nephrogram on the left with no adjacent stranding. This is an age-indeterminate finding but pyelonephritis is not excluded given the history of left-sided pain. Recommend correlation with urinalysis and clinical exam. 3. 12 mm enhancing nodule in the right adrenal gland. Recommend MRI for further characterization. 4. Probable developing small-bowel ileus. 5. Multiple cysts in the left ovary with the largest measuring up to 2.5 cm. This is a somewhat unusual appearance in a postmenopausal woman. Recommend correlation with ultrasound for better assessment of cyst character  and size. Findings called to Dr. Clydene Pugh. Electronically Signed   By: Gerome Sam III M.D   On: 01/24/2016 14:46   Dg Abd Acute W/chest  01/24/2016  CLINICAL DATA:  RIGHT lower quadrant pain of unspecified duration EXAM: DG ABDOMEN ACUTE W/ 1V CHEST COMPARISON:  Chest radiograph 12/08/2010 FINDINGS: Enlargement of cardiac silhouette. Atherosclerotic calcification aorta. Mediastinal contours and pulmonary vascularity otherwise normal for technique. Suspected RIGHT nipple shadow, with LEFT nipple shadow projecting external to the costal margin. No definite acute infiltrate, pleural effusion or pneumothorax. Minimal subsegmental atelectasis at LEFT base. Prominent small bowel loops in mid abdomen with lesser degrees of colonic gas are seen scattered throughout the colon. No definite bowel wall thickening or free intraperitoneal air. Bones demineralized with degenerative changes lumbar spine. No urinary tract calcification. IMPRESSION: Enlargement of cardiac silhouette with minimal LEFT basilar atelectasis. Air-filled small bowel loops in the mid abdomen prominent in number and size though some gas is seen throughout the colon ; this could represent mild small bowel ileus but early versus partial small-bowel obstruction not completely excluded. Electronically Signed   By: Ulyses Southward M.D.   On: 01/24/2016 13:04    ENDOSCOPIC STUDIES: None ever.     Jennye Moccasin  01/25/2016, 11:55 AM Pager: [phone]

## 2016-01-25 NOTE — Progress Notes (Signed)
Paged by RN that pt [facility]140[facility] on [facility]. EKG showed Afib with RVR, rate of 114. BP 120s. Pt is asymptomatic. Will give a 5mg  IV x 1 dose of Cardizem. RN to call if pt's HR still over 105 30 mins after dose. Also, pt has had several loose stools tonight. Had loose stools when she came to hospital. However, frequency and number of stools worsened tonight. Send Cdiff and place Flexiseal. Pt has chemistry labs for am. Will add Mag. If RVR does not subside, get labs now.  Will follow. Jimmye NormanKaren Kirby-Graham, NP [facility]

## 2016-01-26 DIAGNOSIS — I480 Paroxysmal atrial fibrillation: Secondary | ICD-10-CM

## 2016-01-26 DIAGNOSIS — R1030 Lower abdominal pain, unspecified: Secondary | ICD-10-CM

## 2016-01-26 DIAGNOSIS — A09 Infectious gastroenteritis and colitis, unspecified: Secondary | ICD-10-CM

## 2016-01-26 DIAGNOSIS — R197 Diarrhea, unspecified: Secondary | ICD-10-CM | POA: Diagnosis present

## 2016-01-26 DIAGNOSIS — Z720 Tobacco use: Secondary | ICD-10-CM

## 2016-01-26 LAB — MAGNESIUM: MAGNESIUM: 1.7 mg/dL (ref 1.7–2.4)

## 2016-01-26 LAB — COMPREHENSIVE METABOLIC PANEL
ALK PHOS: 72 U/L (ref 38–126)
ALT: 22 U/L (ref 14–54)
AST: 18 U/L (ref 15–41)
Albumin: 2.7 g/dL — ABNORMAL LOW (ref 3.5–5.0)
Anion gap: 10 (ref 5–15)
BUN: <5 mg/dL — ABNORMAL LOW (ref 6–20)
CALCIUM: 8.4 mg/dL — AB (ref 8.9–10.3)
CHLORIDE: 107 mmol/L (ref 101–111)
CO2: 17 mmol/L — ABNORMAL LOW (ref 22–32)
CREATININE: 0.56 mg/dL (ref 0.44–1.00)
Glucose, Bld: 176 mg/dL — ABNORMAL HIGH (ref 65–99)
Potassium: 5.1 mmol/L (ref 3.5–5.1)
Sodium: 134 mmol/L — ABNORMAL LOW (ref 135–145)
TOTAL PROTEIN: 6.3 g/dL — AB (ref 6.5–8.1)
Total Bilirubin: 1.2 mg/dL (ref 0.3–1.2)

## 2016-01-26 LAB — CBC
HCT: 36.7 % (ref 36.0–46.0)
Hemoglobin: 12.1 g/dL (ref 12.0–15.0)
MCH: 28.9 pg (ref 26.0–34.0)
MCHC: 33 g/dL (ref 30.0–36.0)
MCV: 87.6 fL (ref 78.0–100.0)
PLATELETS: 260 10*3/uL (ref 150–400)
RBC: 4.19 MIL/uL (ref 3.87–5.11)
RDW: 12.9 % (ref 11.5–15.5)
WBC: 17.6 10*3/uL — ABNORMAL HIGH (ref 4.0–10.5)

## 2016-01-26 LAB — C DIFFICILE QUICK SCREEN W PCR REFLEX
C DIFFICLE (CDIFF) ANTIGEN: POSITIVE — AB
C Diff toxin: NEGATIVE

## 2016-01-26 LAB — HEPARIN LEVEL (UNFRACTIONATED): Heparin Unfractionated: 0.31 IU/mL (ref 0.30–0.70)

## 2016-01-26 MED ORDER — HEPARIN BOLUS VIA INFUSION
2000.0000 [IU] | Freq: Once | INTRAVENOUS | Status: AC
  Administered 2016-01-26: 2000 [IU] via INTRAVENOUS
  Filled 2016-01-26: qty 2000

## 2016-01-26 MED ORDER — VANCOMYCIN 50 MG/ML ORAL SOLUTION
125.0000 mg | Freq: Four times a day (QID) | ORAL | Status: DC
  Administered 2016-01-26 – 2016-01-27 (×5): 125 mg via ORAL
  Filled 2016-01-26 (×5): qty 2.5

## 2016-01-26 MED ORDER — DEXTROSE 5 % IV SOLN
2.5000 mg/h | INTRAVENOUS | Status: DC
  Administered 2016-01-26: 5 mg/h via INTRAVENOUS
  Filled 2016-01-26: qty 100

## 2016-01-26 MED ORDER — SODIUM CHLORIDE 0.9 % IV SOLN
INTRAVENOUS | Status: DC
  Administered 2016-01-26 – 2016-01-27 (×2): via INTRAVENOUS
  Administered 2016-01-27: 1000 mL via INTRAVENOUS
  Administered 2016-01-28 – 2016-02-01 (×6): via INTRAVENOUS

## 2016-01-26 MED ORDER — HEPARIN (PORCINE) IN NACL 100-0.45 UNIT/ML-% IJ SOLN
1150.0000 [IU]/h | INTRAMUSCULAR | Status: DC
  Administered 2016-01-26: 850 [IU]/h via INTRAVENOUS
  Administered 2016-01-27: 950 [IU]/h via INTRAVENOUS
  Administered 2016-01-28: 1150 [IU]/h via INTRAVENOUS
  Filled 2016-01-26 (×3): qty 250

## 2016-01-26 MED ORDER — HEPARIN SODIUM (PORCINE) 5000 UNIT/ML IJ SOLN
5000.0000 [IU] | Freq: Three times a day (TID) | INTRAMUSCULAR | Status: DC
  Administered 2016-01-26: 5000 [IU] via SUBCUTANEOUS
  Filled 2016-01-26: qty 1

## 2016-01-26 NOTE — Progress Notes (Signed)
Daily Rounding Note  01/26/2016, 8:22 AM  LOS: 2 days   SUBJECTIVE:       Pain is 2 - 5/10.  Flexi-seal placed overnight for watery brown stools.  Even with this is place, having urgency to pass stool and some stool leaking around the flexi-seal.  No nausea.  Feels cold at times  OBJECTIVE:         Vital signs in last 24 hours:    Temp:  [97.8 F (36.6 C)-98.6 F (37 C)] 98.6 F (37 C) (06/07 0730) Pulse Rate:  [34-153] 65 (06/07 0730) Resp:  [16-28] 26 (06/07 0730) BP: (105-163)/(56-120) 141/67 mmHg (06/07 0700) SpO2:  [94 %-100 %] 97 % (06/07 0730) Last BM Date: 01/25/16 Filed Weights   01/24/16 1956  Weight: 55.7 kg (122 lb 12.7 oz)   General: laying quietly, looks ill.  comfortable   Heart: RRR Chest: clear bil.  No cough or labored breathing.   Abdomen: soft, ND, slight left sided tenderness.  No guard or rebound. Deep brown, watery stool in flexi-seal.  Extremities: no CCE.   Neuro/Psych:  Appropriate.  Moves all 4s.    Intake/Output from previous day: 06/06 0701 - 06/07 0700 In: 2840.8 [P.O.:120; I.V.:1170.8; IV Piggyback:1550] Out: 1625 [Urine:1625]  Intake/Output this shift:    Lab Results:  Recent Labs  01/24/16 1135 01/25/16 0413 01/26/16 0422  WBC 16.3* 15.3* 17.6*  HGB 13.0 11.1* 12.1  HCT 38.7 34.0* 36.7  PLT 232 210 260   BMET  Recent Labs  01/25/16 0413 01/25/16 0724 01/26/16 0422  NA 140 138 134*  K 2.9* 3.0* 5.1  CL 108 107 107  CO2 21* 20* 17*  GLUCOSE 98 90 176*  BUN 8 8 <5*  CREATININE 0.84 0.84 0.56  CALCIUM 8.1* 7.9* 8.4*   LFT  Recent Labs  01/24/16 1135 01/25/16 0413 01/26/16 0422  PROT 6.4* 6.1* 6.3*  ALBUMIN 3.4* 2.9* 2.7*  AST ALT ALKPHOS 67 65 72  BILITOT 1.4* 1.8* 1.2   PT/INR  Recent Labs  01/24/16 1549 01/25/16 0413  LABPROT 17.2* 15.5*  INR 1.39 1.22   Hepatitis Panel No results for input(s): HEPBSAG, HCVAB,  HEPAIGM, HEPBIGM in the last 72 hours.  Studies/Results: X-ray Chest Pa And Lateral  01/25/2016  CLINICAL DATA:  Hypertension. Preoperative cardiovascular evaluation EXAM: CHEST  2 VIEW COMPARISON:  January 24, 2016 FINDINGS: There is persistent minimal left base atelectasis. Lungs elsewhere clear. Heart is borderline enlarged with pulmonary vascularity within normal limits. No adenopathy. No bone lesions. IMPRESSION: Minimal left base atelectasis. No edema or consolidation. Heart borderline prominent but stable. No new opacity. Electronically Signed   By: Bretta Bang III M.D.   On: 01/25/2016 07:31   Ct Abdomen Pelvis W Contrast  01/24/2016  CLINICAL DATA:  Left lower quadrant abdominal pain. EXAM: CT ABDOMEN AND PELVIS WITH CONTRAST TECHNIQUE: Multidetector CT imaging of the abdomen and pelvis was performed using the standard protocol following bolus administration of intravenous contrast. CONTRAST:  ISOVUE-300 IOPAMIDOL (ISOVUE-300) INJECTION 61% COMPARISON:  None. FINDINGS: Normal normal lung bases. The appendix is abnormal in appearance at its distal tip. The proximal 90% of the appendix enhances normally. However, there is decreased enhancement at the distal tip with some adjacent stranding and discontinuity of mucosal enhancement. The distal appendix is also mildly dilated measuring between 8 or 9 mm. There are a few dots of free air adjacent  to the distal tip is well. No other free air identified. There is a small amount of free fluid in the pelvis on the right. Cholelithiasis is identified. The liver, portal veins, and spleen are normal. The left adrenal gland is normal. There is an enhancing nodule in the right adrenal gland measuring 12 mm. Mild prominence of the pancreatic duct with no obstructing mass or cause identified. The common bile duct is normal in caliber. The pancreas is otherwise normal. A few small renal cysts are seen on the left. A few are too small to characterize. No  suspicious renal masses. No ureterectasis or ureteral stones. There are regions of decreased cortical enhancement on the left with no adjacent stranding. This becomes more prominent on delayed images. No filling defects in the upper renal collecting systems. The stomach is normal. There are multiple prominent mildly dilated loops of small bowel with intervening loops of normal caliber small bowel. Given the findings in the right lower quadrant, this is favored to represent developing ileus. The colon is unremarkable. There is a non aneurysmal atherosclerotic aorta. No adenopathy. No adenopathy or suspicious mass in the pelvis. The uterus and right ovary are normal. There appear to be several cysts in the left ovary with the largest measuring up to 2.5 cm. This is asymmetric to the right. No other abnormalities are seen within the pelvis. Delayed images through the upper kidney demonstrate no filling defects in the renal collecting system. IMPRESSION: 1. The findings are consistent with a perforated appendicitis. The appearance is somewhat unusual with lack of enhancement at the distal tip and discontinuity of the mucosa. While this could represent a typical appendicitis, appendicitis due to a mass at the distal tip is not completely excluded. Recommend correlation with pathology. 2. Striated nephrogram on the left with no adjacent stranding. This is an age-indeterminate finding but pyelonephritis is not excluded given the history of left-sided pain. Recommend correlation with urinalysis and clinical exam. 3. 12 mm enhancing nodule in the right adrenal gland. Recommend MRI for further characterization. 4. Probable developing small-bowel ileus. 5. Multiple cysts in the left ovary with the largest measuring up to 2.5 cm. This is a somewhat unusual appearance in a postmenopausal woman. Recommend correlation with ultrasound for better assessment of cyst character and size. Findings called to Dr. Clydene PughKnott. Electronically  Signed   By: Gerome Samavid  Williams III M.D   On: 01/24/2016 14:46   Dg Abd Acute W/chest  01/24/2016  CLINICAL DATA:  RIGHT lower quadrant pain of unspecified duration EXAM: DG ABDOMEN ACUTE W/ 1V CHEST COMPARISON:  Chest radiograph 12/08/2010 FINDINGS: Enlargement of cardiac silhouette. Atherosclerotic calcification aorta. Mediastinal contours and pulmonary vascularity otherwise normal for technique. Suspected RIGHT nipple shadow, with LEFT nipple shadow projecting external to the costal margin. No definite acute infiltrate, pleural effusion or pneumothorax. Minimal subsegmental atelectasis at LEFT base. Prominent small bowel loops in mid abdomen with lesser degrees of colonic gas are seen scattered throughout the colon. No definite bowel wall thickening or free intraperitoneal air. Bones demineralized with degenerative changes lumbar spine. No urinary tract calcification. IMPRESSION: Enlargement of cardiac silhouette with minimal LEFT basilar atelectasis. Air-filled small bowel loops in the mid abdomen prominent in number and size though some gas is seen throughout the colon ; this could represent mild small bowel ileus but early versus partial small-bowel obstruction not completely excluded. Electronically Signed   By: Ulyses SouthwardMark  Boles M.D.   On: 01/24/2016 13:04   Scheduled Meds: . amLODipine  10 mg Oral  Daily  . atorvastatin  20 mg Oral Daily  . lisinopril  10 mg Oral Daily  . nicotine  14 mg Transdermal Daily  . piperacillin-tazobactam (ZOSYN)  IV  3.375 g Intravenous Q8H  . potassium chloride  40 mEq Oral TID  . sodium chloride flush  3 mL Intravenous Q12H   Continuous Infusions: . sodium chloride 100 mL/hr at 01/26/16 0733  . diltiazem (CARDIZEM) infusion 2.5 mg/hr (01/26/16 0629)   PRN Meds:.acetaminophen **OR** acetaminophen, bisacodyl, hydrALAZINE, HYDROcodone-acetaminophen, magnesium citrate, morphine injection, ondansetron **OR** ondansetron (ZOFRAN) IV, senna-docusate, traZODone   ASSESMENT:     * Appendicitis vs cecal mass. Pt with acute onset abd pain, n/v, diarrhea over last few days. Fever and leukocytosis at arrival. Sxs/fever improved but WBCs rising; day 3 Zosyn.Note C diff Ag +, toxin negative.  .    * Prominent pancreatic duct without mass, normal CBD and uncomplicated gallstones per CT. t Bili minimally elevated.   * Left ovarian cysts in post menopausal woman.   *  Hypokalemia, now hyperkalemic post correction.   * Right adrenal nodule.   * Hx Afib and CVA. Chronic Coumadin on hold. Afib/RVR, Cardizem gtt in place, converted to NSR.    * Syncope. Troponin I normal x 1. EKG with T wave inversion in inferiolateral leads. Marland Kitchen     PLAN   *  Ordered MRI abd/pelvis with/without contrast with language interpreter present.  RN working on getting language interpreter for the MRI.    *  C diff PCR per ID rec. Ordered .      Jennye Moccasin  01/26/2016, 8:22 AM Pager: [phone]    [facility] Attending   I have taken an interval history, reviewed the chart and examined the patient. I agree with the Advanced Practitioner's note, impression and recommendations.    Diarrhea has developed and persisted and C fiff Ag + Toxin negative - this is least likely scenario for C fiff w/ indeterminate but I think starting vancomycin ok for now.  To have MRI tomorrow. Remains on Pip/[place]z [place] C diff PCR and MR to decide next diagnostic steps  Reviewed w/ her and interprter  Iva Boop, MD, [facility] [phone] (pager) [phone] after 5 PM, weekends and holidays  01/26/2016 6:45 PM

## 2016-01-26 NOTE — Consult Note (Signed)
Cardiology Consult    Patient ID: Linda [name] MRN: [id number], DOB/AGE: 04/05/1941   Admit date: 01/24/2016 Date of Consult: 01/26/2016  Primary Physician: Nicki ReaperBAITY, REGINA, NP Reason for Consult: Atrial Fibrillation with RVR Primary Cardiologist: Dr. Graciela HusbandsKlein Requesting Provider: Dr. Elisabeth Pigeonevine   History of Present Illness    Interpreter used for this encounter: ID # [id number]  Linda [name] is a 75 y.o. female with past medical history of HTN, HLD, PAF (on Coumadin), CVA (2012), and tobacco abuse who presented to [facility] on 01/24/2016 for a syncopal event which occurred while waiting to be seen at Urgent Care for abdominal pain.    She reported having 10+ bowel movements in the 3 days leading up to her presentation. Developed abdominal pain on 01/24/2016 which prompted her visit to Urgent Care. While at Urgent Care, she had a syncopal event and was transferred to [facility].   Initial labs showed a normal Lipase. CMET with no significant abnormalities, creatinine stable at 0.79.  WBC elevated to 16.3. Troponin negative. INR sub-threpeautic at 1.39. She did meet Sepsis criteria with a Temp > 100, R > 22, and elevated WBC count. Abdominal CT showed findings consistent with a perforated appendicitis, a 12 mm enhancing nodule in the right adrenal gland, probable developing small-bowel ileus, and multiple cysts in the left ovary with the largest measuring up to 2.5 cm.   Surgery was consulted and noted the CT was concerning for a mass in the cecum. She was started on Zosyn and IV fluids and made NPO. Gastroenterology has since been consulted for the mass and she is going for an MRI today. Will likely require a colonoscopy, but this will be performed following her MRI. Of note, her C.diff antigen has also resulted to be positive.   Overnight on 01/25/2016, she was noted to have gone into atrial fibrillation with RVR, with HR in the 120's - 140's. She was given IV Cardizem 5mg  IV. She then converted to NSR on  01/26/2016 at 0430. She has since maintained NSR with a HR in the 60's - 70's since.   In talking with the patient today, she denies any chest pain or recent dyspnea with exertion. Reports having mild palpitations when tachycardia last night, but those symptoms have resolved. She also reports not being on her cardiac medications for 20+ days, including her Coumadin. Her main concern at the time of this encounter is her abdominal pain, for she says it is unbearable. For MRI imaging later today.  Past Medical History   Past Medical History  Diagnosis Date  . Hypertension   . Hyperlipidemia   . Tobacco abuse     Ongoing (3-6 cigarettes per day, 25 pack year history)  . History of CVA (cerebrovascular accident)     New diagnosis December 08, 2010  . Atrial fibrillation (HCC)     dx in setting of stroke 4/12;  echo with EF 60-65%, mild LVH, mild AI, grade 1 diast dysfxn  . Impaired glucose tolerance 05/08/2011  . Hx of cardiovascular stress test     a. Myoview 8/12:  EF 69%, no ischemia.   . Coronary artery disease     History reviewed. No pertinent past surgical history.   Allergies  No Known Allergies  Inpatient Medications    . amLODipine  10 mg Oral Daily  . atorvastatin  20 mg Oral Daily  . lisinopril  10 mg Oral Daily  . nicotine  14 mg Transdermal Daily  .  piperacillin-tazobactam (ZOSYN)  IV  3.375 g Intravenous Q8H  . potassium chloride  40 mEq Oral TID  . sodium chloride flush  3 mL Intravenous Q12H    Family History    Family History  Problem Relation Age of Onset  . Coronary artery disease Neg Hx     Social History    Social History   Social History  . Marital Status: Widowed    Spouse Name: N/A  . Number of Children: N/A  . Years of Education: N/A   Occupational History  .  [employer]  . Not working    Social History Main Topics  . Smoking status: Light Tobacco Smoker -- 1.00 packs/day for 25 years    Types: Cigarettes  . Smokeless tobacco: Not on file       Comment: 3-6 cigarettes per day  . Alcohol Use: No  . Drug Use: No  . Sexual Activity: Not on file   Other Topics Concern  . Not on file   Social History Narrative   Lives in [place] with her daughters   Regular diet   No regular exercise but active and independent with all daily activities     Review of Systems    General:  No chills, fever, night sweats or weight changes.  Cardiovascular:  No chest pain, dyspnea on exertion, edema, orthopnea,  paroxysmal nocturnal dyspnea. Positive for palpitations. Dermatological: No rash, lesions/masses Respiratory: No cough, dyspnea Urologic: No hematuria, dysuria Abdominal:   No nausea, vomiting, bright red blood per rectum, melena, or hematemesis. Positive for abdominal pain and diarrhea. Neurologic:  No visual changes, wkns, changes in mental status. All other systems reviewed and are otherwise negative except as noted above.  Physical Exam    Blood pressure 139/66, pulse 66, temperature 98.1 F (36.7 C), temperature source Oral, resp. rate 18, height 5' (1.524 m), weight 122 lb 12.7 oz (55.7 kg), SpO2 97 %.  General: Pleasant, female appearing in NAD.  Psych: Normal affect. Neuro: Alert and oriented X 3. Moves all extremities spontaneously. HEENT: Normal  Neck: Supple without bruits or JVD. Lungs:  Resp regular and unlabored, CTA without wheezing or rales. Heart: RRR no s3, s4, or murmurs. Abdomen: Soft,  BS + x 4. Slight left-sided tenderness. Extremities: No clubbing, cyanosis or edema. DP/PT/Radials 2+ and equal bilaterally.  Labs    Troponin (Point of Care Test)  Recent Labs  01/24/16 1147  TROPIPOC 0.07   No results for input(s): CKTOTAL, CKMB, TROPONINI in the last 72 hours. Lab Results  Component Value Date   WBC 17.6* 01/26/2016   HGB 12.1 01/26/2016   HCT 36.7 01/26/2016   MCV 87.6 01/26/2016   PLT 260 01/26/2016    Recent Labs Lab 01/26/16 0422  NA 134*  K 5.1  CL 107  CO2 17*  BUN <5*   CREATININE 0.56  CALCIUM 8.4*  PROT 6.3*  BILITOT 1.2  ALKPHOS 72  ALT 22  AST 18  GLUCOSE 176*   Lab Results  Component Value Date   CHOL 201* 08/07/2012   HDL 41.00 08/07/2012   LDLCALC 103* 05/08/2011   TRIG 287.0* 08/07/2012   No results found for: [facility]   Radiology Studies    X-ray Chest Pa And Lateral: 01/25/2016  CLINICAL DATA:  Hypertension. Preoperative cardiovascular evaluation EXAM: CHEST  2 VIEW COMPARISON:  January 24, 2016 FINDINGS: There is persistent minimal left base atelectasis. Lungs elsewhere clear. Heart is borderline enlarged with pulmonary vascularity within normal limits. No adenopathy.  No bone lesions. IMPRESSION: Minimal left base atelectasis. No edema or consolidation. Heart borderline prominent but stable. No new opacity. Electronically Signed   By: Bretta Bang III M.D.   On: 01/25/2016 07:31   Ct Abdomen Pelvis W Contrast: 01/24/2016  CLINICAL DATA:  Left lower quadrant abdominal pain. EXAM: CT ABDOMEN AND PELVIS WITH CONTRAST TECHNIQUE: Multidetector CT imaging of the abdomen and pelvis was performed using the standard protocol following bolus administration of intravenous contrast. CONTRAST:  ISOVUE-300 IOPAMIDOL (ISOVUE-300) INJECTION 61% COMPARISON:  None. FINDINGS: Normal normal lung bases. The appendix is abnormal in appearance at its distal tip. The proximal 90% of the appendix enhances normally. However, there is decreased enhancement at the distal tip with some adjacent stranding and discontinuity of mucosal enhancement. The distal appendix is also mildly dilated measuring between 8 or 9 mm. There are a few dots of free air adjacent to the distal tip is well. No other free air identified. There is a small amount of free fluid in the pelvis on the right. Cholelithiasis is identified. The liver, portal veins, and spleen are normal. The left adrenal gland is normal. There is an enhancing nodule in the right adrenal gland measuring 12 mm. Mild prominence  of the pancreatic duct with no obstructing mass or cause identified. The common bile duct is normal in caliber. The pancreas is otherwise normal. A few small renal cysts are seen on the left. A few are too small to characterize. No suspicious renal masses. No ureterectasis or ureteral stones. There are regions of decreased cortical enhancement on the left with no adjacent stranding. This becomes more prominent on delayed images. No filling defects in the upper renal collecting systems. The stomach is normal. There are multiple prominent mildly dilated loops of small bowel with intervening loops of normal caliber small bowel. Given the findings in the right lower quadrant, this is favored to represent developing ileus. The colon is unremarkable. There is a non aneurysmal atherosclerotic aorta. No adenopathy. No adenopathy or suspicious mass in the pelvis. The uterus and right ovary are normal. There appear to be several cysts in the left ovary with the largest measuring up to 2.5 cm. This is asymmetric to the right. No other abnormalities are seen within the pelvis. Delayed images through the upper kidney demonstrate no filling defects in the renal collecting system. IMPRESSION: 1. The findings are consistent with a perforated appendicitis. The appearance is somewhat unusual with lack of enhancement at the distal tip and discontinuity of the mucosa. While this could represent a typical appendicitis, appendicitis due to a mass at the distal tip is not completely excluded. Recommend correlation with pathology. 2. Striated nephrogram on the left with no adjacent stranding. This is an age-indeterminate finding but pyelonephritis is not excluded given the history of left-sided pain. Recommend correlation with urinalysis and clinical exam. 3. 12 mm enhancing nodule in the right adrenal gland. Recommend MRI for further characterization. 4. Probable developing small-bowel ileus. 5. Multiple cysts in the left ovary with the  largest measuring up to 2.5 cm. This is a somewhat unusual appearance in a postmenopausal woman. Recommend correlation with ultrasound for better assessment of cyst character and size. Findings called to Dr. Clydene Pugh. Electronically Signed   By: Gerome Sam III M.D   On: 01/24/2016 14:46   Dg Abd Acute W/chest: 01/24/2016  CLINICAL DATA:  RIGHT lower quadrant pain of unspecified duration EXAM: DG ABDOMEN ACUTE W/ 1V CHEST COMPARISON:  Chest radiograph 12/08/2010 FINDINGS: Enlargement of  cardiac silhouette. Atherosclerotic calcification aorta. Mediastinal contours and pulmonary vascularity otherwise normal for technique. Suspected RIGHT nipple shadow, with LEFT nipple shadow projecting external to the costal margin. No definite acute infiltrate, pleural effusion or pneumothorax. Minimal subsegmental atelectasis at LEFT base. Prominent small bowel loops in mid abdomen with lesser degrees of colonic gas are seen scattered throughout the colon. No definite bowel wall thickening or free intraperitoneal air. Bones demineralized with degenerative changes lumbar spine. No urinary tract calcification. IMPRESSION: Enlargement of cardiac silhouette with minimal LEFT basilar atelectasis. Air-filled small bowel loops in the mid abdomen prominent in number and size though some gas is seen throughout the colon ; this could represent mild small bowel ileus but early versus partial small-bowel obstruction not completely excluded. Electronically Signed   By: Ulyses Southward M.D.   On: 01/24/2016 13:04    EKG & Cardiac Imaging    EKG: 01/25/2016: Atrial fibrillation w/ RVR, HR 114. TWI in inferior and lateral leads (more prominent on tracings this admission when in NSR).   Echocardiogram: 01/25/2016 Study Conclusions - Left ventricle: The cavity size was normal. Wall thickness was  normal. Systolic function was normal. The estimated ejection  fraction was in the range of 60% to 65%. Wall motion was normal;  there were no  regional wall motion abnormalities. Features are  consistent with a pseudonormal left ventricular filling pattern,  with concomitant abnormal relaxation and increased filling  pressure (grade 2 diastolic dysfunction). - Aortic valve: There was mild to moderate regurgitation. - Mitral valve: There was mild regurgitation. - Tricuspid valve: There was mild-moderate regurgitation. - Pulmonary arteries: Systolic pressure was mildly increased. PA  peak pressure: 33 mm Hg (S).  Assessment & Plan    1. Atrial Fibrillation with RVR - has a history of PAF. At 2200 on 01/25/2016, she was noted to have gone into atrial fibrillation with RVR, with HR in the 120's - 140's. She was given IV Cardizem  and converted to NSR on 01/26/2016 at 0430. She has since maintained NSR with a HR in the 60's - 70's.  - This patients CHA2DS2-VASc Score and unadjusted Ischemic Stroke Rate (% per year) is equal to 9.7 % stroke rate/year from a score of 6 (CHF, HTN, Female, Age, CVA (2)). Was on Coumadin PTA but had not taken for 20+ days due to running out of the medication and not having transportation to office appointments. Currently on Heparin. Restart oral anticoagulation prior to discharge.  - consider addition of low-dose BB if she has recurrent episodes.  2. EKG Changes - EKG shows new TWI in inferior and lateral leads (more prominent on tracings this admission when in NSR).  - she denies any recent chest pain or anginal equivalents.  - Echo this admission shows a preserved EF of 60-65% with no wall motion abnormalities. - could consider outpatient NST if she developed anginal symptoms.  3. Chronic Diastolic CHF - echo this admission shows an EF of 60-65% with Grade 2 DD. - does not appear volume overloaded on physical exam. - continue Amlodipine.  4. HLD - continue statin therapy.   5. Abdominal Pain/ Possible perforated Appendicitis/ C.diff antigen Positive -  Abdominal CT on admission showed findings  consistent with a perforated appendicitis, a 12 mm enhancing nodule in the right adrenal gland, probable developing small-bowel ileus, and multiple cysts in the left ovary with the largest measuring up to 2.5 cm.  - Surgery was consulted and noted the CT was concerning for a mass in  the cecum. For MRI today to further evaluate. - per admitting team and GI.    Signed, Ellsworth Lennox, PA-C 01/26/2016, 2:50 PM Pager: [phone]   I have examined the patient and reviewed assessment and plan and discussed with patient.  Agree with above as stated.  Back in NSR.  Heparin for stroke prevention.  Hold coumadin given possible need for surgery.  Back in NSR.  No further cardiac testing needed at this time.  SHe was asymptomatic from a cardiac standpoint with activity when she was walking before surgery.    Lance Muss

## 2016-01-26 NOTE — Progress Notes (Signed)
ANTICOAGULATION CONSULT NOTE - Initial Consult  Pharmacy Consult for heparin Indication: atrial fibrillation  No Known Allergies  Patient Measurements: Height: 5' (152.4 cm) Weight: 122 lb 12.7 oz (55.7 kg) IBW/kg (Calculated) : 45.5 Heparin Dosing Weight: 55.7 kg  Vital Signs: Temp: 98.6 F (37 C) (06/07 0730) Temp Source: Oral (06/07 0730) BP: 139/66 mmHg (06/07 1111) Pulse Rate: 66 (06/07 1100)  Labs:  Recent Labs  01/24/16 1135 01/24/16 1549 01/25/16 0413 01/25/16 0724 01/26/16 0422  HGB 13.0  --  11.1*  --  12.1  HCT 38.7  --  34.0*  --  36.7  PLT 232  --  210  --  260  APTT  --  38*  --   --   --   LABPROT  --  17.2* 15.5*  --   --   INR  --  1.39 1.22  --   --   CREATININE 0.79  --  0.84 0.84 0.56    Estimated Creatinine Clearance: 48.3 mL/min (by C-G formula based on Cr of 0.56).   Medical History: Past Medical History  Diagnosis Date  . Hypertension   . Hyperlipidemia   . Tobacco abuse     Ongoing (3-6 cigarettes per day, 25 pack year history)  . History of CVA (cerebrovascular accident)     New diagnosis December 08, 2010  . Atrial fibrillation (HCC)     dx in setting of stroke 4/12;  echo with EF 60-65%, mild LVH, mild AI, grade 1 diast dysfxn  . Impaired glucose tolerance 05/08/2011  . Hx of cardiovascular stress test     a. Myoview 8/12:  EF 69%, no ischemia.   . Coronary artery disease     Medications: See medical record  Assessment: [age] yoF on warfarin PTA for Afib. Warfarin has been on hold for abdominal pain workup and INR is 1.22 today. Pharmacy consulted to start heparin drip. Pt received one dose of heparin 5000 units subcutaneously this morning. Will start with a smaller bolus dose considering there is some heparin already on board. CBC stable. No bleeding noted.  Goal of Therapy:  Heparin level 0.3-0.7 units/ml Monitor platelets by anticoagulation protocol: Yes   Plan:  Give 2000 units bolus x 1 Start heparin infusion at 850  units/hr Check anti-Xa level in 8 hours and daily while on heparin Continue to monitor H&H and platelets F/u restart OAC   Thank you for allowing us to participate in this patients care. Signe Coltonya C Jager Koska, PharmD Pager: [phone] 01/26/2016,12:06 PM

## 2016-01-26 NOTE — Progress Notes (Signed)
Discussed plan of MRI in the am with patient via interpreter. Patient understood with all questions answered. No complaints of pain. Will continue to monitor.

## 2016-01-26 NOTE — Clinical Social Work Note (Signed)
CSW working on getting in-person [place] interpreter for MRI today. Notified interpreter dept that they will not be needed until after 12:00 but no definite time yet. Interpreter may have another place to go after 12:00 but CSW will keep them updated on progress.  Charlynn CourtSarah Lavilla Delamora, CSW [phone]

## 2016-01-26 NOTE — Progress Notes (Signed)
ANTICOAGULATION CONSULT NOTE - f/u Consult  Pharmacy Consult for heparin Indication: atrial fibrillation  No Known Allergies  Patient Measurements: Height: 5' (152.4 cm) Weight: 122 lb 12.7 oz (55.7 kg) IBW/kg (Calculated) : 45.5 Heparin Dosing Weight: 55.7 kg  Vital Signs: Temp: 98.1 F (36.7 C) (06/07 1940) Temp Source: Oral (06/07 1940) BP: 152/67 mmHg (06/07 1940) Pulse Rate: 69 (06/07 1940)  Labs:  Recent Labs  01/24/16 1135 01/24/16 1549 01/25/16 0413 01/25/16 0724 01/26/16 0422 01/26/16 2034  HGB 13.0  --  11.1*  --  12.1  --   HCT 38.7  --  34.0*  --  36.7  --   PLT 232  --  210  --  260  --   APTT  --  38*  --   --   --   --   LABPROT  --  17.2* 15.5*  --   --   --   INR  --  1.39 1.22  --   --   --   HEPARINUNFRC  --   --   --   --   --  0.31  CREATININE 0.79  --  0.84 0.84 0.56  --     Estimated Creatinine Clearance: 48.3 mL/min (by C-G formula based on Cr of 0.56).   Medical History: Past Medical History  Diagnosis Date  . Hypertension   . Hyperlipidemia   . Tobacco abuse     Ongoing (3-6 cigarettes per day, 25 pack year history)  . History of CVA (cerebrovascular accident)     New diagnosis December 08, 2010  . Atrial fibrillation (HCC)     dx in setting of stroke 4/12;  echo with EF 60-65%, mild LVH, mild AI, grade 1 diast dysfxn  . Impaired glucose tolerance 05/08/2011  . Hx of cardiovascular stress test     a. Myoview 8/12:  EF 69%, no ischemia.   . Coronary artery disease     Medications: See medical record  Assessment: [age] yoF on warfarin PTA for Afib. Warfarin has been on hold for abdominal pain workup and INR is 1.22 today. Pharmacy consulted to start heparin drip. Pt received one dose of heparin 5000 units subcutaneously this morning. Will start with a smaller bolus dose considering there is some heparin already on board. CBC stable. No bleeding noted.  -PM: HL 0.31 in goal  Goal of Therapy:  Heparin level 0.3-0.7 units/ml Monitor  platelets by anticoagulation protocol: Yes   Plan:  Continue IV heparin at 850 units/hr Daily HL and CBC  Shalom Ware S. Merilynn Finlandobertson, PharmD, BCPS Clinical Staff Pharmacist Pager [phone]   01/26/2016,9:33 PM

## 2016-01-26 NOTE — Progress Notes (Signed)
Subjective: Interpreter:  [phone]   Pt says diarrhea started again last PM.  She was not having diarrhea when I saw her yesterday.  She still complains of pain all over on exam but say pain is worse in  left lower abdomen, compared to the right lower abdomen.  Unfortunately she complains of pain anyplace we touch her.    Objective: Vital signs in last 24 hours: Temp:  [97.8 F (36.6 C)-98.6 F (37 C)] 98.6 F (37 C) (06/07 0730) Pulse Rate:  [34-153] 62 (06/07 0800) Resp:  [16-28] 20 (06/07 0800) BP: (105-163)/(56-120) 141/65 mmHg (06/07 0800) SpO2:  [94 %-100 %] 97 % (06/07 0730) Last BM Date: 01/25/16 PO 120 Having multiple stools, being checked for C diff C diff toxin negative, antigen positive  Afebrile, VSS WBC trending up. Intake/Output from previous day: 06/06 0701 - 06/07 0700 In: 2840.8 [P.O.:120; I.V.:1170.8; IV Piggyback:1550] Out: 1625 [Urine:1625] Intake/Output this shift: Total I/O In: 45 [I.V.:45] Out: -   General appearance: alert, cooperative and no distress GI: soft, she is really tender any place you palpate her.  she tell me the LLQ is still the most severe,but I have a hard time telling from physical exam  + BS and diarrhea this AM ongoing. Telem shows sinus, but she had AF last PM  Lab Results:   Recent Labs  01/25/16 0413 01/26/16 0422  WBC 15.3* 17.6*  HGB 11.1* 12.1  HCT 34.0* 36.7  PLT 210 260    BMET  Recent Labs  01/25/16 0724 01/26/16 0422  NA 138 134*  K 3.0* 5.1  CL 107 107  CO2 20* 17*  GLUCOSE 90 176*  BUN 8 <5*  CREATININE 0.84 0.56  CALCIUM 7.9* 8.4*   PT/INR  Recent Labs  01/24/16 1549 01/25/16 0413  LABPROT 17.2* 15.5*  INR 1.39 1.22     Recent Labs Lab 01/24/16 1135 01/25/16 0413 01/26/16 0422  AST 21 18 18   ALT 28 26 22   ALKPHOS 67 65 72  BILITOT 1.4* 1.8* 1.2  PROT 6.4* 6.1* 6.3*  ALBUMIN 3.4* 2.9* 2.7*     Lipase     Component Value Date/Time   LIPASE 20 01/24/2016 1135      Studies/Results: X-ray Chest Pa And Lateral  01/25/2016  CLINICAL DATA:  Hypertension. Preoperative cardiovascular evaluation EXAM: CHEST  2 VIEW COMPARISON:  January 24, 2016 FINDINGS: There is persistent minimal left base atelectasis. Lungs elsewhere clear. Heart is borderline enlarged with pulmonary vascularity within normal limits. No adenopathy. No bone lesions. IMPRESSION: Minimal left base atelectasis. No edema or consolidation. Heart borderline prominent but stable. No new opacity. Electronically Signed   By: Bretta BangWilliam  Woodruff III M.D.   On: 01/25/2016 07:31   Ct Abdomen Pelvis W Contrast  01/24/2016  CLINICAL DATA:  Left lower quadrant abdominal pain. EXAM: CT ABDOMEN AND PELVIS WITH CONTRAST TECHNIQUE: Multidetector CT imaging of the abdomen and pelvis was performed using the standard protocol following bolus administration of intravenous contrast. CONTRAST:  100mL ISOVUE-300 IOPAMIDOL (ISOVUE-300) INJECTION 61% COMPARISON:  None. FINDINGS: Normal normal lung bases. The appendix is abnormal in appearance at its distal tip. The proximal 90% of the appendix enhances normally. However, there is decreased enhancement at the distal tip with some adjacent stranding and discontinuity of mucosal enhancement. The distal appendix is also mildly dilated measuring between 8 or 9 mm. There are a few dots of free air adjacent to the distal tip is well. No other free air identified. There is a small  amount of free fluid in the pelvis on the right. Cholelithiasis is identified. The liver, portal veins, and spleen are normal. The left adrenal gland is normal. There is an enhancing nodule in the right adrenal gland measuring 12 mm. Mild prominence of the pancreatic duct with no obstructing mass or cause identified. The common bile duct is normal in caliber. The pancreas is otherwise normal. A few small renal cysts are seen on the left. A few are too small to characterize. No suspicious renal masses. No ureterectasis or  ureteral stones. There are regions of decreased cortical enhancement on the left with no adjacent stranding. This becomes more prominent on delayed images. No filling defects in the upper renal collecting systems. The stomach is normal. There are multiple prominent mildly dilated loops of small bowel with intervening loops of normal caliber small bowel. Given the findings in the right lower quadrant, this is favored to represent developing ileus. The colon is unremarkable. There is a non aneurysmal atherosclerotic aorta. No adenopathy. No adenopathy or suspicious mass in the pelvis. The uterus and right ovary are normal. There appear to be several cysts in the left ovary with the largest measuring up to 2.5 cm. This is asymmetric to the right. No other abnormalities are seen within the pelvis. Delayed images through the upper kidney demonstrate no filling defects in the renal collecting system. IMPRESSION: 1. The findings are consistent with a perforated appendicitis. The appearance is somewhat unusual with lack of enhancement at the distal tip and discontinuity of the mucosa. While this could represent a typical appendicitis, appendicitis due to a mass at the distal tip is not completely excluded. Recommend correlation with pathology. 2. Striated nephrogram on the left with no adjacent stranding. This is an age-indeterminate finding but pyelonephritis is not excluded given the history of left-sided pain. Recommend correlation with urinalysis and clinical exam. 3. 12 mm enhancing nodule in the right adrenal gland. Recommend MRI for further characterization. 4. Probable developing small-bowel ileus. 5. Multiple cysts in the left ovary with the largest measuring up to 2.5 cm. This is a somewhat unusual appearance in a postmenopausal woman. Recommend correlation with ultrasound for better assessment of cyst character and size. Findings called to Dr. Clydene Pugh. Electronically Signed   By: Gerome Sam III M.D   On:  01/24/2016 14:46   Dg Abd Acute W/chest  01/24/2016  CLINICAL DATA:  RIGHT lower quadrant pain of unspecified duration EXAM: DG ABDOMEN ACUTE W/ 1V CHEST COMPARISON:  Chest radiograph 12/08/2010 FINDINGS: Enlargement of cardiac silhouette. Atherosclerotic calcification aorta. Mediastinal contours and pulmonary vascularity otherwise normal for technique. Suspected RIGHT nipple shadow, with LEFT nipple shadow projecting external to the costal margin. No definite acute infiltrate, pleural effusion or pneumothorax. Minimal subsegmental atelectasis at LEFT base. Prominent small bowel loops in mid abdomen with lesser degrees of colonic gas are seen scattered throughout the colon. No definite bowel wall thickening or free intraperitoneal air. Bones demineralized with degenerative changes lumbar spine. No urinary tract calcification. IMPRESSION: Enlargement of cardiac silhouette with minimal LEFT basilar atelectasis. Air-filled small bowel loops in the mid abdomen prominent in number and size though some gas is seen throughout the colon ; this could represent mild small bowel ileus but early versus partial small-bowel obstruction not completely excluded. Electronically Signed   By: Ulyses Southward M.D.   On: 01/24/2016 13:04    Medications: . amLODipine  10 mg Oral Daily  . atorvastatin  20 mg Oral Daily  . heparin subcutaneous  5,000  Units Subcutaneous Q8H  . lisinopril  10 mg Oral Daily  . nicotine  14 mg Transdermal Daily  . piperacillin-tazobactam (ZOSYN)  IV  3.375 g Intravenous Q8H  . potassium chloride  40 mEq Oral TID  . sodium chloride flush  3 mL Intravenous Q12H   . sodium chloride 100 mL/hr at 01/26/16 0800  . diltiazem (CARDIZEM) infusion Stopped (01/26/16 0730)    Assessment/Plan Perforated appendix vs possible cecal mass  Diarrhea  AF on chronic coumadin Abnormal EKG Hypokalemia -  resolved  No compliance out of some meds for 2 weeks. Hx of CVA Tobacco use FEN: NPO/IV fluids ID: Day  3 Zosyn DVT: SCD/add heparin this AM, will defer to Medicine on increased anticoagulation.    Plan:  I have ask the nursing staff to look into getting an interpreter to be available to assist with MRI.  I will talked with ID, and will get a C diff. PCR.   Will let Medicine order MRI after Interpreter issue is resolved.  LOS: 2 days    Darivs Lunden 01/26/2016 [phone]

## 2016-01-26 NOTE — Progress Notes (Signed)
Patient converted to NSR at 0412 while on 5mg  Cardizem gtt. HR 68, BP 155/66.  MD notified.

## 2016-01-26 NOTE — Progress Notes (Signed)
Patient ID: DAISHA FILOSA, female   DOB: May 31, 1941, 75 y.o.   MRN: [id number]  PROGRESS NOTE    EMMAGENE ORTNER  ALP:[id number] DOB: 25-Oct-1940 DOA: 01/24/2016  PCP: Webb Silversmith, NP   Brief Narrative:  75 y.o. female with HTN, HLD, Afib on Coumadin, Tobacco/COPD, h/o CVA, brought by EMS from urgent care facility due to loss of consciousness, abd pain with cramps, over 10 episodes of loose BM's. She thought she had food poisoning and was taking imodium but her symptoms have not improved.   In ED, CT abdomen and pelvis notable for questionable  perforated appendicitis. Incidental 12 mm enhancing nodule in the right adrenal gland.Probable developing small-bowel ileus. White count was 16.3. Lactic acid 0.9. Troponin 0.07. Zosyn started.   Assessment & Plan:  Acute abdominal pain without melena / questionable perforated appendicitis  - CT abdomen and pelvis shows findings consistent with ? perforated appendicitis  - Per surgery, concern with ? mass at the base of the appendix/cecum area - GI consulted for consideration of colonoscopy prior to any surgical interventions  - Prior to colonoscopy, plan for MRI with and without the contrast  - Continue NPO - Continue fluids for hydration  Sepsis due to possible perforated appendicitis versus left pyelonephritis / Leukocytosis  - Sepsis criteria met on admission with T 100F, RR > 22, WBC 16 K, source ? Perforated appendicitis vs Left pyelonephritis as suggested per CT abd - Continue zosyn  - Blood cultures negative so far  - Urine culture pending   Diarrhea - C.diff antigen positive, indicating colonization rather than true infection  Syncope, grade II diastolic CHF - ECHO notable for stable EF 55 % with grade II diastolic CHF - No sign of volume overload on physical exam   Atrial Fibrillation CHA2DS2-VASc score 5 - Patient ranout all of her medications about 20 days prior to admission  - HR at 62; was on Cardizem drip overnight for RVR,  now off of Cardizem drip - On heparin drip for AC (takes coumadin at home)  Hypokalemia  - Due to GI losses - Supplemented - Magnesium WNL - Check BMP in am   Hyperlipidemia - Continue home statins  Hypertension, essential  - Continue lisinopril and Norvasc   Adrenal mass, ovarian cyst  -Incidental 12 mm enhancing nodule in the right adrenal gland seen on CT - Will follow upon MRI results   Tobacco Abuse - Cessation consultation provided   DVT prophylaxis: On heparin drip  Code Status: Full  Family Communication: Family not at the bedside this am Disposition Plan: Home once cleared by consulting teams, surgery and GI   Consultants:   Surgery   GI  Cardiology   Procedures:   2 D ECHO 01/25/2016 -- > ejection fraction 60% with grade 2 diastolic dysfunction  Antimicrobials:  Zosyn 6/5 -->    Subjective: Rapid a fib overnight, needed to start Cardizem drip.  Objective: Filed Vitals:   01/26/16 0500 01/26/16 0600 01/26/16 0700 01/26/16 0730  BP: 155/66 149/63 141/67   Pulse: 68  63 65  Temp:    98.6 F (37 C)  TempSrc:    Oral  Resp: _0 Height:      Weight:      SpO2: 97%   97%    Intake/Output Summary (Last 24 hours) at 01/26/16 0802 Last data filed at 01/26/16 0600  Gross per 24 hour  Intake 2840.75 ml  Output   1625 ml  Net 1215.75 ml  Filed Weights   01/24/16 1956  Weight: 55.7 kg (122 lb 12.7 oz)    Examination:  General exam: Appears calm and comfortable  Respiratory system: Clear to auscultation. Respiratory effort normal. Cardiovascular system: S1 & S2 heard, Rate controlled  Gastrointestinal system: Abdomen is tender to palpation but no rebound tenderness, (+) BS Central nervous system: Alert and oriented. No focal neurological deficits. Extremities: Symmetric 5 x 5 power. Skin: No rashes, lesions or ulcers Psychiatry: Judgement and insight appear normal. Mood & affect appropriate.   Data Reviewed: I have personally  reviewed following labs and imaging studies  CBC:  Recent Labs Lab 01/24/16 1135 01/25/16 0413 01/26/16 0422  WBC 16.3* 15.3* 17.6*  NEUTROABS 13.1*  --   --   HGB 13.0 11.1* 12.1  HCT 38.7 34.0* 36.7  MCV 87.6 87.9 87.6  PLT 232 210 811   Basic Metabolic Panel:  Recent Labs Lab 01/24/16 1135 01/25/16 0413 01/25/16 0724 01/25/16 1322 01/26/16 0422  NA 139 140 138  --  134*  K 3.6 2.9* 3.0*  --  5.1  CL 110 108 107  --  107  CO2 22 21* 20*  --  17*  GLUCOSE 136* 98 90  --  176*  BUN _0 --  <5*  CREATININE 0.79 0.84 0.84  --  0.56  CALCIUM 8.3* 8.1* 7.9*  --  8.4*  MG  --   --   --  1.8 1.7   GFR: Estimated Creatinine Clearance: 48.3 mL/min (by C-G formula based on Cr of 0.56). Liver Function Tests:  Recent Labs Lab 01/24/16 1135 01/25/16 0413 01/26/16 0422  AST _1 ALT _2 ALKPHOS 67 65 72  BILITOT 1.4* 1.8* 1.2  PROT 6.4* 6.1* 6.3*  ALBUMIN 3.4* 2.9* 2.7*    Recent Labs Lab 01/24/16 1135  LIPASE 20   No results for input(s): AMMONIA in the last 168 hours. Coagulation Profile:  Recent Labs Lab 01/24/16 1549 01/25/16 0413  INR 1.39 1.22   Cardiac Enzymes: No results for input(s): CKTOTAL, CKMB, CKMBINDEX, TROPONINI in the last 168 hours. BNP (last 3 results) No results for input(s): PROBNP in the last 8760 hours. HbA1C: No results for input(s): HGBA1C in the last 72 hours. CBG:  Recent Labs Lab 01/24/16 1136  GLUCAP 133*   Lipid Profile: No results for input(s): CHOL, HDL, LDLCALC, TRIG, CHOLHDL, LDLDIRECT in the last 72 hours. Thyroid Function Tests: No results for input(s): TSH, T4TOTAL, FREET4, T3FREE, THYROIDAB in the last 72 hours. Anemia Panel: No results for input(s): VITAMINB12, FOLATE, FERRITIN, TIBC, IRON, RETICCTPCT in the last 72 hours. Urine analysis:    Component Value Date/Time   COLORURINE AMBER* 01/24/2016 1940   APPEARANCEUR CLEAR 01/24/2016 1940   LABSPEC 1.020 01/24/2016 1940   PHURINE 5.5  01/24/2016 1940   GLUCOSEU NEGATIVE 01/24/2016 1940   HGBUR NEGATIVE 01/24/2016 1940   BILIRUBINUR SMALL* 01/24/2016 1940   KETONESUR 15* 01/24/2016 1940   PROTEINUR 30* 01/24/2016 1940   NITRITE NEGATIVE 01/24/2016 1940   LEUKOCYTESUR NEGATIVE 01/24/2016 1940   Sepsis Labs: _3 (procalcitonin:4,lacticidven:4)   Culture, blood (routine x 2)     Status: None (Preliminary result)   Collection Time: 01/24/16  2:54 PM  Result Value Ref Range Status   Specimen Description BLOOD RIGHT ANTECUBITAL  Final   Special Requests BOTTLES DRAWN AEROBIC AND ANAEROBIC 10CC  Final   Culture NO GROWTH 1 DAY  Final   Report Status PENDING  Incomplete  Culture, blood (routine x 2)     Status: None (Preliminary result)   Collection Time: 01/24/16  3:00 PM  Result Value Ref Range Status   Specimen Description BLOOD RIGHT HAND  Final   Special Requests BOTTLES DRAWN AEROBIC ONLY 10CC  Final   Culture NO GROWTH 1 DAY  Final   Report Status PENDING  Incomplete  MRSA PCR Screening     Status: None   Collection Time: 01/24/16  8:00 PM  Result Value Ref Range Status   MRSA by PCR NEGATIVE NEGATIVE Final  C difficile quick scan w PCR reflex     Status: Abnormal   Collection Time: 01/25/16  8:50 PM  Result Value Ref Range Status   C Diff antigen POSITIVE (A) NEGATIVE Final   C Diff toxin NEGATIVE NEGATIVE Final   C Diff interpretation   Final    C. difficile present, but toxin not detected. This indicates colonization. In most cases, this does not require treatment. If patient has signs and symptoms consistent with colitis, consider treatment. Requires ENTERIC precautions.      Radiology Studies: X-ray Chest Pa And Lateral 01/25/2016  Minimal left base atelectasis. No edema or consolidation. Heart borderline prominent but stable. No new opacity. Electronically Signed   By: Lowella Grip III M.D.   On: 01/25/2016 07:31   Ct Abdomen Pelvis W Contrast 01/24/2016  1. The findings are consistent with  a perforated appendicitis. The appearance is somewhat unusual with lack of enhancement at the distal tip and discontinuity of the mucosa. While this could represent a typical appendicitis, appendicitis due to a mass at the distal tip is not completely excluded. Recommend correlation with pathology. 2. Striated nephrogram on the left with no adjacent stranding. This is an age-indeterminate finding but pyelonephritis is not excluded given the history of left-sided pain. Recommend correlation with urinalysis and clinical exam. 3. 12 mm enhancing nodule in the right adrenal gland. Recommend MRI for further characterization. 4. Probable developing small-bowel ileus. 5. Multiple cysts in the left ovary with the largest measuring up to 2.5 cm. This is a somewhat unusual appearance in a postmenopausal woman. Recommend correlation with ultrasound for better assessment of cyst character and size. Findings called to Dr. Laneta Simmers. Electronically Signed   By: Dorise Bullion III M.D   On: 01/24/2016 14:46   Dg Abd Acute W/chest              01/24/2016  Enlargement of cardiac silhouette with minimal LEFT basilar atelectasis. Air-filled small bowel loops in the mid abdomen prominent in number and size though some gas is seen throughout the colon ; this could represent mild small bowel ileus but early versus partial small-bowel obstruction not completely excluded. Electronically Signed   By: Lavonia Dana M.D.   On: 01/24/2016 13:04      Scheduled Meds: . amLODipine  10 mg Oral Daily  . atorvastatin  20 mg Oral Daily  . lisinopril  10 mg Oral Daily  . nicotine  14 mg Transdermal Daily  . piperacillin-tazobactam (ZOSYN)  IV  3.375 g Intravenous Q8H  . potassium chloride  40 mEq Oral TID  . sodium chloride flush  3 mL Intravenous Q12H   Continuous Infusions: . sodium chloride 100 mL/hr at 01/26/16 0733  . diltiazem (CARDIZEM) infusion 2.5 mg/hr (01/26/16 0629)     LOS: 2 days    Time spent: 25 minutes Greater than  50% of the time spent on counseling and coordinating the care.  Leisa Lenz, MD [facility] Pager [phone]  If 7PM-7AM, please contact night-coverage [email or web address] Password [email or web address] 01/26/2016, 8:02 AM

## 2016-01-26 NOTE — Progress Notes (Signed)
MRI not able to see patient between hours of 9a-5p (when interpreter is available). Spoke with staff in MRI and have patient down for first available scan at 9am tomorrow morning. Left message with Charlynn CourtSarah Boswell, social worker, to try to schedule the interpreter for that time. Will pass info on to night RN.

## 2016-01-27 ENCOUNTER — Inpatient Hospital Stay (HOSPITAL_COMMUNITY): Payer: Medicare HMO

## 2016-01-27 DIAGNOSIS — K37 Unspecified appendicitis: Secondary | ICD-10-CM

## 2016-01-27 DIAGNOSIS — R1084 Generalized abdominal pain: Secondary | ICD-10-CM

## 2016-01-27 DIAGNOSIS — R933 Abnormal findings on diagnostic imaging of other parts of digestive tract: Secondary | ICD-10-CM

## 2016-01-27 DIAGNOSIS — I480 Paroxysmal atrial fibrillation: Secondary | ICD-10-CM | POA: Diagnosis present

## 2016-01-27 LAB — CBC
HCT: 34.4 % — ABNORMAL LOW (ref 36.0–46.0)
Hemoglobin: 11.4 g/dL — ABNORMAL LOW (ref 12.0–15.0)
MCH: 28.4 pg (ref 26.0–34.0)
MCHC: 33.1 g/dL (ref 30.0–36.0)
MCV: 85.8 fL (ref 78.0–100.0)
Platelets: 258 10*3/uL (ref 150–400)
RBC: 4.01 MIL/uL (ref 3.87–5.11)
RDW: 12.6 % (ref 11.5–15.5)
WBC: 15.1 10*3/uL — ABNORMAL HIGH (ref 4.0–10.5)

## 2016-01-27 LAB — URINE CULTURE: Culture: NO GROWTH

## 2016-01-27 LAB — CLOSTRIDIUM DIFFICILE BY PCR: CDIFFPCR: NEGATIVE

## 2016-01-27 LAB — BASIC METABOLIC PANEL
ANION GAP: 9 (ref 5–15)
BUN: 5 mg/dL — ABNORMAL LOW (ref 6–20)
CHLORIDE: 107 mmol/L (ref 101–111)
CO2: 18 mmol/L — AB (ref 22–32)
Calcium: 8.6 mg/dL — ABNORMAL LOW (ref 8.9–10.3)
Creatinine, Ser: 0.69 mg/dL (ref 0.44–1.00)
GFR calc Af Amer: >60 mL/min (ref >60)
GLUCOSE: 82 mg/dL (ref 65–99)
POTASSIUM: 4.3 mmol/L (ref 3.5–5.1)
Sodium: 134 mmol/L — ABNORMAL LOW (ref 135–145)

## 2016-01-27 LAB — HEPARIN LEVEL (UNFRACTIONATED)
HEPARIN UNFRACTIONATED: 0.23 [IU]/mL — AB (ref 0.30–0.70)
HEPARIN UNFRACTIONATED: 0.22 [IU]/mL — AB (ref 0.30–0.70)

## 2016-01-27 LAB — C DIFFICILE QUICK SCREEN W PCR REFLEX
C Diff antigen: POSITIVE — AB
C Diff toxin: NEGATIVE

## 2016-01-27 LAB — MAGNESIUM: Magnesium: 1.9 mg/dL (ref 1.7–2.4)

## 2016-01-27 MED ORDER — DILTIAZEM HCL 100 MG IV SOLR
INTRAVENOUS | Status: AC
  Administered 2016-01-28: 10 mg/h via INTRAVENOUS
  Filled 2016-01-27: qty 100

## 2016-01-27 MED ORDER — GADOBENATE DIMEGLUMINE 529 MG/ML IV SOLN
12.0000 mL | Freq: Once | INTRAVENOUS | Status: AC | PRN
  Administered 2016-01-27: 12 mL via INTRAVENOUS

## 2016-01-27 MED ORDER — DILTIAZEM HCL 25 MG/5ML IV SOLN
5.0000 mg | Freq: Once | INTRAVENOUS | Status: AC
  Administered 2016-01-27: 5 mg via INTRAVENOUS
  Filled 2016-01-27: qty 5

## 2016-01-27 MED ORDER — DILTIAZEM HCL 100 MG IV SOLR
5.0000 mg/h | INTRAVENOUS | Status: DC
  Administered 2016-01-27: 5 mg/h via INTRAVENOUS
  Administered 2016-01-27 – 2016-01-28 (×2): 10 mg/h via INTRAVENOUS
  Filled 2016-01-27 (×3): qty 100

## 2016-01-27 NOTE — Progress Notes (Signed)
ANTICOAGULATION CONSULT NOTE - F/U Consult Pharmacy Consult for heparin Indication: atrial fibrillation  No Known Allergies  Patient Measurements: Height: 5' (152.4 cm) Weight: 122 lb 12.7 oz (55.7 kg) IBW/kg (Calculated) : 45.5 Heparin Dosing Weight: 55.7 kg  Vital Signs: Temp: 98.7 F (37.1 C) (06/08 1550) Temp Source: Oral (06/08 1550) BP: 116/77 mmHg (06/08 1109) Pulse Rate: 88 (06/08 1109)  Labs:  Recent Labs  01/25/16 0413 01/25/16 0724 01/26/16 0422 01/26/16 2034 01/27/16 0352 01/27/16 1453  HGB 11.1*  --  12.1  --  11.4*  --   HCT 34.0*  --  36.7  --  34.4*  --   PLT 210  --  260  --  258  --   LABPROT 15.5*  --   --   --   --   --   INR 1.22  --   --   --   --   --   HEPARINUNFRC  --   --   --  0.31 0.23* 0.22*  CREATININE 0.84 0.84 0.56  --  0.69  --     Estimated Creatinine Clearance: 48.3 mL/min (by C-G formula based on Cr of 0.69).   Medical History: Past Medical History  Diagnosis Date  . Hypertension   . Hyperlipidemia   . Tobacco abuse     Ongoing (3-6 cigarettes per day, 25 pack year history)  . History of CVA (cerebrovascular accident)     New diagnosis December 08, 2010  . Atrial fibrillation (HCC)     dx in setting of stroke 4/12;  echo with EF 60-65%, mild LVH, mild AI, grade 1 diast dysfxn  . Impaired glucose tolerance 05/08/2011  . Hx of cardiovascular stress test     a. Myoview 8/12:  EF 69%, no ischemia.   . Coronary artery disease    Assessment: [age] yoF on warfarin PTA for Afib. Warfarin has been on hold for abdominal pain workup. Pharmacy assisting with heparin dosing.   HL this afternoon is 0.22 after most rate increase to 950 units/hr. No issues with infusion or sxs of bleeding per RN.   Goal of Therapy:  Heparin level 0.3-0.7 units/ml Monitor platelets by anticoagulation protocol: Yes   Plan:  1. Increase IV heparin to 1100 units/h 2. 8h HL 3. Daily HL and CBC 4. Monitor s/sx bleeding  Pollyann SamplesAndy Cassey Bacigalupo, PharmD,  BCPS 01/27/2016, 3:52 PM Pager: [phone]

## 2016-01-27 NOTE — Progress Notes (Addendum)
Patient ID: Linda [name], female   DOB: July 10, 1941, 75 y.o.   MRN: [id number]  PROGRESS NOTE    MARIELLE MANTIONE  YME:[id number] DOB: [birth year] DOA: 01/24/2016  PCP: Webb Silversmith, NP   Brief Narrative:  75 y.o. female with HTN, HLD, Afib on Coumadin, Tobacco/COPD, h/o CVA, brought by EMS from urgent care facility due to loss of consciousness, abd pain with cramps, over 10 episodes of loose BM's. She thought she had food poisoning and was taking imodium but her symptoms have not improved.   In ED, CT abdomen and pelvis notable for questionable  perforated appendicitis. Incidental 12 mm enhancing nodule in the right adrenal gland.Probable developing small-bowel ileus. White count was 16.3. Lactic acid 0.9. Troponin 0.07. Zosyn started. She was seen by GI in consultation as well as surgery. Recommendation at this point is to obtain MRI for further evaluation prior to any surgical intervention.   Assessment & Plan:  Acute abdominal pain without melena / questionable perforated appendicitis  - CT abdomen and pelvis shows findings consistent with ? perforated appendicitis  - Per surgery, MRI to be done prior to any recommendations for surgical intervention to rule out potential mass at the base of the appendix/cecum rather than perforated appendicitis - We'll follow-up with GI if there is a plan for colonoscopy. This will be dependent on MRI results - Continue nothing by mouth and fluids for hydration - Continue antiemetics as needed for nausea or vomiting - Continue pain management efforts - Due to episodes of bradycardia, tachycardia would recommend to continue to monitor in step down unit telemetry  Sepsis due to possible perforated appendicitis versus left pyelonephritis / Leukocytosis  - Sepsis criteria met on admission with T 100F, RR > 22, WBC 16 K, source ? Perforated appendicitis vs Left pyelonephritis as suggested per CT abd - Patient is on Zosyn. - Please note because of diarrhea  patient started on empiric vancomycin by mouth. Stool for C. difficile is positive for antigen only but not toxin which is indicating colonization rather than true infection. - Blood cultures negative so far  - Urine culture shows no growth - Leukocytosis is improving  Diarrhea - C.diff antigen positive, indicating colonization rather than true infection - Because of ongoing diarrhea she was started on by mouth vancomycin empirically  Metabolic acidosis - Likely due to diarrhea. As diarrhea improves metabolic acidosis will likely improve as well  Syncope, grade II diastolic CHF - ECHO notable for stable EF 55 % with grade II diastolic CHF - No sign of volume overload on physical exam  - Stable respiratory status  Atrial Fibrillation CHA2DS2-VASc score 5 - Patient ranout all of her medications about 20 days prior to admission  - Has had atrial fibrillation with rapid ventricular rate on 66/ to 6/7 requiring Cardizem drip - She is on anti-coagulation with heparin drip. Once by mouth regimen resumed she can go back to Coumadin dosing per pharmacy - Cardiology following  Hypokalemia  - Due to GI losses - Supplemented - Magnesium WNL  Hyperlipidemia - Continue atorvastatin 20 mg daily  Hypertension, essential  - Continue lisinopril and Norvasc   Adrenal mass, ovarian cyst  -Incidental 12 mm enhancing nodule in the right adrenal gland seen on CT - Patient is going for MRI this morning  Tobacco Abuse - Cessation consultation provided   DVT prophylaxis: On heparin drip  Code Status: Full  Family Communication: Family not at the bedside this am Disposition Plan: not stable for discharge at  this point,unclear at which point she will be ready for discharge.  Consultants:   Surgery   GI  Cardiology   Procedures:   2 D ECHO 01/25/2016 - ejection fraction 60% with grade 2 diastolic dysfunction  Antimicrobials:  Zosyn 6/5 -->  Vanco PO 01/27/2016 -->      Subjective: No overnight events. Still diarrhea.   Objective: Filed Vitals:   01/26/16 1940 01/26/16 2315 01/27/16 0305 01/27/16 0600  BP: 152/67 114/63 144/58 131/84  Pulse: 69 59 69 132  Temp: 98.1 F (36.7 C) 98.3 F (36.8 C) 98.6 F (37 C)   TempSrc: Oral Oral Oral   Resp: _0 Height:      Weight:      SpO2: 96% 96% 96% 96%    Intake/Output Summary (Last 24 hours) at 01/27/16 0700 Last data filed at 01/27/16 0603  Gross per 24 hour  Intake 2464.32 ml  Output   1100 ml  Net 1364.32 ml   Filed Weights   01/24/16 1956  Weight: 55.7 kg (122 lb 12.7 oz)    Examination:  General exam: No acute distress   Respiratory system: No wheezing, no rhonchi  Cardiovascular system: S1 & S2 (+), tachycardic  Gastrointestinal system: Abdomen is tender to palpation in mid abdomen, (+) BS, no rebound tenderness  Central nervous system: No focal neurological deficits. Extremities: No edema, palpable pulses  Skin: warm and dry  Psychiatry: Normal mood and effect  Data Reviewed: I have personally reviewed following labs and imaging studies  CBC:  Recent Labs Lab 01/24/16 1135 01/25/16 0413 01/26/16 0422 01/27/16 0352  WBC 16.3* 15.3* 17.6* 15.1*  NEUTROABS 13.1*  --   --   --   HGB 13.0 11.1* 12.1 11.4*  HCT 38.7 34.0* 36.7 34.4*  MCV 87.6 87.9 87.6 85.8  PLT 232 210 260 758   Basic Metabolic Panel:  Recent Labs Lab 01/24/16 1135 01/25/16 0413 01/25/16 0724 01/25/16 1322 01/26/16 0422 01/27/16 0352  NA 139 140 138  --  134* 134*  K 3.6 2.9* 3.0*  --  5.1 4.3  CL 110 108 107  --  107 107  CO2 22 21* 20*  --  17* 18*  GLUCOSE 136* 98 90  --  176* 82  BUN _1 --  <5* 5*  CREATININE 0.79 0.84 0.84  --  0.56 0.69  CALCIUM 8.3* 8.1* 7.9*  --  8.4* 8.6*  MG  --   --   --  1.8 1.7 1.9   GFR: Estimated Creatinine Clearance: 48.3 mL/min (by C-G formula based on Cr of 0.69). Liver Function Tests:  Recent Labs Lab 01/24/16 1135 01/25/16 0413  01/26/16 0422  AST _2 ALT _3 ALKPHOS 67 65 72  BILITOT 1.4* 1.8* 1.2  PROT 6.4* 6.1* 6.3*  ALBUMIN 3.4* 2.9* 2.7*    Recent Labs Lab 01/24/16 1135  LIPASE 20   No results for input(s): AMMONIA in the last 168 hours. Coagulation Profile:  Recent Labs Lab 01/24/16 1549 01/25/16 0413  INR 1.39 1.22   Cardiac Enzymes: No results for input(s): CKTOTAL, CKMB, CKMBINDEX, TROPONINI in the last 168 hours. BNP (last 3 results) No results for input(s): PROBNP in the last 8760 hours. HbA1C: No results for input(s): HGBA1C in the last 72 hours. CBG:  Recent Labs Lab 01/24/16 1136  GLUCAP 133*   Lipid Profile: No results for input(s): CHOL, HDL, LDLCALC, TRIG, CHOLHDL, LDLDIRECT in the last  72 hours. Thyroid Function Tests: No results for input(s): TSH, T4TOTAL, FREET4, T3FREE, THYROIDAB in the last 72 hours. Anemia Panel: No results for input(s): VITAMINB12, FOLATE, FERRITIN, TIBC, IRON, RETICCTPCT in the last 72 hours. Urine analysis:    Component Value Date/Time   COLORURINE AMBER* 01/24/2016 1940   APPEARANCEUR CLEAR 01/24/2016 1940   LABSPEC 1.020 01/24/2016 1940   PHURINE 5.5 01/24/2016 1940   GLUCOSEU NEGATIVE 01/24/2016 1940   HGBUR NEGATIVE 01/24/2016 1940   BILIRUBINUR SMALL* 01/24/2016 1940   KETONESUR 15* 01/24/2016 1940   PROTEINUR 30* 01/24/2016 1940   NITRITE NEGATIVE 01/24/2016 1940   LEUKOCYTESUR NEGATIVE 01/24/2016 1940   Sepsis Labs: _0 (procalcitonin:4,lacticidven:4)   Culture, blood (routine x 2)     Status: None (Preliminary result)   Collection Time: 01/24/16  2:54 PM  Result Value Ref Range Status   Specimen Description BLOOD RIGHT ANTECUBITAL  Final   Special Requests BOTTLES DRAWN AEROBIC AND ANAEROBIC 10CC  Final   Culture NO GROWTH 1 DAY  Final   Report Status PENDING  Incomplete  Culture, blood (routine x 2)     Status: None (Preliminary result)   Collection Time: 01/24/16  3:00 PM  Result Value Ref Range  Status   Specimen Description BLOOD RIGHT HAND  Final   Special Requests BOTTLES DRAWN AEROBIC ONLY 10CC  Final   Culture NO GROWTH 1 DAY  Final   Report Status PENDING  Incomplete  MRSA PCR Screening     Status: None   Collection Time: 01/24/16  8:00 PM  Result Value Ref Range Status   MRSA by PCR NEGATIVE NEGATIVE Final  C difficile quick scan w PCR reflex     Status: Abnormal   Collection Time: 01/25/16  8:50 PM  Result Value Ref Range Status   C Diff antigen POSITIVE (A) NEGATIVE Final   C Diff toxin NEGATIVE NEGATIVE Final   C Diff interpretation   Final    C. difficile present, but toxin not detected. This indicates colonization. In most cases, this does not require treatment. If patient has signs and symptoms consistent with colitis, consider treatment. Requires ENTERIC precautions.      Radiology Studies: X-ray Chest Pa And Lateral 01/25/2016  Minimal left base atelectasis. No edema or consolidation. Heart borderline prominent but stable. No new opacity. Electronically Signed   By: Lowella Grip III M.D.   On: 01/25/2016 07:31   Ct Abdomen Pelvis W Contrast 01/24/2016  1. The findings are consistent with a perforated appendicitis. The appearance is somewhat unusual with lack of enhancement at the distal tip and discontinuity of the mucosa. While this could represent a typical appendicitis, appendicitis due to a mass at the distal tip is not completely excluded. Recommend correlation with pathology. 2. Striated nephrogram on the left with no adjacent stranding. This is an age-indeterminate finding but pyelonephritis is not excluded given the history of left-sided pain. Recommend correlation with urinalysis and clinical exam. 3. 12 mm enhancing nodule in the right adrenal gland. Recommend MRI for further characterization. 4. Probable developing small-bowel ileus. 5. Multiple cysts in the left ovary with the largest measuring up to 2.5 cm. This is a somewhat unusual appearance in a  postmenopausal woman. Recommend correlation with ultrasound for better assessment of cyst character and size. Findings called to Dr. Laneta Simmers. Electronically Signed   By: Dorise Bullion III M.D   On: 01/24/2016 14:46   Dg Abd Acute W/chest  01/24/2016  Enlargement of cardiac silhouette with minimal LEFT basilar atelectasis. Air-filled small bowel loops in the mid abdomen prominent in number and size though some gas is seen throughout the colon ; this could represent mild small bowel ileus but early versus partial small-bowel obstruction not completely excluded. Electronically Signed   By: Lavonia Dana M.D.   On: 01/24/2016 13:04      Scheduled Meds: . amLODipine  10 mg Oral Daily  . atorvastatin  20 mg Oral Daily  . diltiazem (CARDIZEM) infusion      . lisinopril  10 mg Oral Daily  . nicotine  14 mg Transdermal Daily  . piperacillin-tazobactam (ZOSYN)  IV  3.375 g Intravenous Q8H  . potassium chloride  40 mEq Oral TID  . sodium chloride flush  3 mL Intravenous Q12H  . vancomycin  125 mg Oral Q6H   Continuous Infusions: . sodium chloride 100 mL/hr at 01/27/16 0603  . diltiazem (CARDIZEM) infusion 10 mg/hr (01/27/16 0641)  . heparin 950 Units/hr (01/27/16 0641)     LOS: 3 days    Time spent: 25 minutes Greater than 50% of the time spent on counseling and coordinating the care.   Leisa Lenz, MD [facility] Pager [phone]  If 7PM-7AM, please contact night-coverage [email or web address] Password [email or web address] 01/27/2016, 7:00 AM

## 2016-01-27 NOTE — Care Management Note (Signed)
Case Management Note  Patient Details  Name: Linda [name] MRN: [id number] Date of Birth: [birth year]  Subjective/Objective:   Atrial Fibrillation with RVR, Abdominal Pain/ Possible perforated Appendicitis/ C.diff antigen Positive, MRI 01/27/2016               Action/Plan: Discharge Planning:  NCM spoke to pt via Interpreter, Linda [name], Language [employer] # [phone]. Pt lives at home with dtr and grand-children. She can afford her medications and able to ambulate without DME. [facility]. Waiting final recommendations for home.   PCP- Linda MunroeBAITY, REGINA W NP  Expected Discharge Date:                  Expected Discharge Plan:  Home/Self Care  In-House Referral:  NA  Discharge planning Services  CM Consult  Post Acute Care Choice:  NA Choice offered to:  NA  DME Arranged:  N/A DME Agency:  NA  HH Arranged:  NA HH Agency:  NA  Status of Service:  In process, will continue to follow  Medicare Important Message Given:    Date Medicare IM Given:    Medicare IM give by:    Date Additional Medicare IM Given:    Additional Medicare Important Message give by:     If discussed at Long Length of Stay Meetings, dates discussed:    Additional Comments:  Linda [name], Linda Ballester Ellen, RN 01/27/2016, 4:10 PM

## 2016-01-27 NOTE — Progress Notes (Signed)
Notified Cardiology MD that patient converted to SB 58 at 1930. Vitals stable and patient resting the plan will be to continue Cardizem gtt at a low dose if heart rate handles it because she is NPO until tomorrow then GI will confirm if further testing needed or advance diet.

## 2016-01-27 NOTE — Progress Notes (Addendum)
Daily Rounding Note  01/27/2016, 10:03 AM  LOS: 3 days   SUBJECTIVE:       Pain is "little"  More on left side.  No n/v.  She is hungry. 110 cc stool yesterday, 500 cc so far today.   OBJECTIVE:         Vital signs in last 24 hours:    Temp:  [97.9 F (36.6 C)-98.6 F (37 C)] 98.6 F (37 C) (06/08 0305) Pulse Rate:  [43-132] 43 (06/08 0800) Resp:  [12-22] 12 (06/08 0800) BP: (114-155)/(58-89) 126/89 mmHg (06/08 0800) SpO2:  [96 %-100 %] 96 % (06/08 0800) Last BM Date: 01/26/16 Filed Weights   01/24/16 1956  Weight: 55.7 kg (122 lb 12.7 oz)   General: pleasant, comfortable   Heart: irreg, irreg.   Chest: clear bil.  No dyspnea Abdomen: soft, mild left sided tenderness.    Extremities: no CCE.   Neuro/Psych:  No distress, calm, cooperative, pleasant.   Intake/Output from previous day: 06/07 0701 - 06/08 0700 In: 2464.3 [I.V.:2314.3; IV Piggyback:150] Out: 1100 [Urine:1100]  Intake/Output this shift: Total I/O In: -  Out: 500 [Urine:500]  Lab Results:  Recent Labs  01/25/16 0413 01/26/16 0422 01/27/16 0352  WBC 15.3* 17.6* 15.1*  HGB 11.1* 12.1 11.4*  HCT 34.0* 36.7 34.4*  PLT 210 260 258   BMET  Recent Labs  01/25/16 0724 01/26/16 0422 01/27/16 0352  NA 138 134* 134*  K 3.0* 5.1 4.3  CL 107 107 107  CO2 20* 17* 18*  GLUCOSE 90 176* 82  BUN 8 <5* 5*  CREATININE 0.84 0.56 0.69  CALCIUM 7.9* 8.4* 8.6*   LFT  Recent Labs  01/24/16 1135 01/25/16 0413 01/26/16 0422  PROT 6.4* 6.1* 6.3*  ALBUMIN 3.4* 2.9* 2.7*  AST 21 18 18   ALT 28 26 22   ALKPHOS 67 65 72  BILITOT 1.4* 1.8* 1.2      ASSESMENT:   * Appendicitis vs cecal mass. Pt with acute onset abd pain, n/v, diarrhea over last few days. Diarrhea continues but n/v resolved and pain improved.  Fever and leukocytosis at arrival. Sxs/fever, WBCs improved.  Day 4 Zosyn.  *  Diarrhea.  S/p flexiseal placement.  C diff Ag +,  toxin negative. C diff PCR not yet collected. Day 2 empiric oral vanc.    * Prominent pancreatic duct without mass, normal CBD and uncomplicated gallstones per CT. TBili minimally elevated.   * Left ovarian cysts in post menopausal woman.   * Hypokalemia, now hyperkalemic post correction.   * Right adrenal nodule.   * Hx Afib and CVA. Chronic Coumadin on hold, on Heparin gtt. Initially resolved with Cardizem drip but Afib/RVR again this AM, Cardizem gtt in place.     PLAN   *  MRI abd pelvis, on schedule for today.  Going down now.  Reminded RN to collect C diff pcr.      Jennye MoccasinSarah Gribbin  01/27/2016, 10:03 AM Pager: [phone]    [facility] Attending    I agree with the Advanced Practitioner's note, impression and recommendations.   Nothing bad on the MR - ovarian cyst and hydrosalpinx, RLQ inflammation suspected C diff PCR is neg so I stopped vancomycin  She seems to be getting a bit better with Abx - I do not plan on a colonoscopy tomorrow and would consider repeating a CT with oral, rectal and IV contrast at some point depending upon clinical course.  The clinical scenario is not compatible with cecal cancer but moreso with an acute infectious inflammatory process. I suppose a perforated cancer could do this but I doubt that.  Will sort out diagnostic f/u plan more tomorrow.  Iva Boop, MD, [facility] [phone] (pager) [phone] after 5 PM, weekends and holidays  01/27/2016 6:22 PM

## 2016-01-27 NOTE — Clinical Social Work Note (Signed)
Interpreter obtained. RN notified. Was supposed to be available at 10:00 am today.  Charlynn CourtSarah Emmalia Heyboer, CSW [phone]

## 2016-01-27 NOTE — Progress Notes (Signed)
Pharmacy Antibiotic Note  Linda [name] is a 75 y.o. female admitted on 01/24/2016 with intra-abdominal infxn.  Pharmacy has been consulted for zosyn dosing. Now afeb and WBC remains elevated at 15.1. SCr is stable. Pt now started on oral vancomycin for C. Diff.  Plan: - Continue Zosyn 3.375gm IV Q8H (4 hr inf) - F/u renal fxn, C&S, clinical status   Height: 5' (152.4 cm) Weight: 122 lb 12.7 oz (55.7 kg) IBW/kg (Calculated) : 45.5  Temp (24hrs), Avg:98.2 F (36.8 C), Min:97.9 F (36.6 C), Max:98.6 F (37 C)   Recent Labs Lab 01/24/16 1135 01/24/16 1149 01/24/16 1514 01/25/16 0413 01/25/16 0724 01/26/16 0422 01/27/16 0352  WBC 16.3*  --   --  15.3*  --  17.6* 15.1*  CREATININE 0.79  --   --  0.84 0.84 0.56 0.69  LATICACIDVEN  --  1.93 0.97  --   --   --   --     Estimated Creatinine Clearance: 48.3 mL/min (by C-G formula based on Cr of 0.69).    No Known Allergies  Antimicrobials this admission: 6/5 Zosyn>> 6/7 Vancomycin PO>>  Dose adjustments this admission: N/A  Microbiology results: 6/5 blood x2>> ngtd 6/5 MRSA PCR negative 6/8 C Diff antigen positive, toxin negative   Thank you for allowing us to participate in this patients care. Signe Coltonya C Graviela Nodal, PharmD Pager: [phone] 01/27/2016 3:26 PM

## 2016-01-27 NOTE — Progress Notes (Signed)
Hospital Problem List     Active Problems:   Hypertension   Hyperlipidemia   Smoker   Appendicitis   Syncope   Generalized abdominal pain   Abnormal computed tomography of cecum and terminal ileum   Diarrhea of presumed infectious origin   Lower abdominal pain     Patient Profile:   Primary Cardiologist:Dr. Graciela HusbandsKlein  75 y.o. female w/ PMH of HTN, HLD, PAF (on Coumadin), CVA (2012), and tobacco abuse who [facility] for a syncopal event and abdominal pain.  Subjective   Interpreter Used: Sport and exercise psychologistLanguage Resources Interpreter present.  Still reporting significant abdominal pain. Went back into atrial fibrillation overnight. Denies any chest pain or palpitations.  Inpatient Medications    . amLODipine  10 mg Oral Daily  . atorvastatin  20 mg Oral Daily  . diltiazem (CARDIZEM) infusion      . lisinopril  10 mg Oral Daily  . nicotine  14 mg Transdermal Daily  . piperacillin-tazobactam (ZOSYN)  IV  3.375 g Intravenous Q8H  . sodium chloride flush  3 mL Intravenous Q12H  . vancomycin  125 mg Oral Q6H    Vital Signs    Filed Vitals:   01/27/16 0600 01/27/16 0800 01/27/16 0900 01/27/16 1109  BP: 131/84 126/89 119/81 116/77  Pulse: 132 43 86 88  Temp:      TempSrc:      Resp: 19 12 17 23   Height:      Weight:      SpO2: 96% 96% 95% 98%    Intake/Output Summary (Last 24 hours) at 01/27/16 1511 Last data filed at 01/27/16 0900  Gross per 24 hour  Intake 2419.32 ml  Output   1300 ml  Net 1119.32 ml   Filed Weights   01/24/16 1956  Weight: 122 lb 12.7 oz (55.7 kg)    Physical Exam    General: Well developed, well nourished, female appearing in no acute distress. Head: Normocephalic, atraumatic.  Neck: Supple without bruits, JVD not elevated. Lungs:  Resp regular and unlabored, CTA without wheezing or rales. Heart: Irregularly irregular, S1, S2, no S3, S4, or murmur; no rub. Abdomen: Soft, non-distended with normoactive bowel sounds. No  hepatomegaly. No rebound/guarding. No obvious abdominal masses. Tender to palpation along LUQ. Extremities: No clubbing, cyanosis, or edema. Distal pedal pulses are 2+ bilaterally. Neuro: Alert and oriented X 3. Moves all extremities spontaneously. Psych: Normal affect.  Labs    CBC  Recent Labs  01/26/16 0422 01/27/16 0352  WBC 17.6* 15.1*  HGB 12.1 11.4*  HCT 36.7 34.4*  MCV 87.6 85.8  PLT 260 258   Basic Metabolic Panel  Recent Labs  01/26/16 0422 01/27/16 0352  NA 134* 134*  K 5.1 4.3  CL 107 107  CO2 17* 18*  GLUCOSE 176* 82  BUN <5* 5*  CREATININE 0.56 0.69  CALCIUM 8.4* 8.6*  MG 1.7 1.9   Liver Function Tests  Recent Labs  01/25/16 0413 01/26/16 0422  AST 18 18  ALT 26 22  ALKPHOS 65 72  BILITOT 1.8* 1.2  PROT 6.1* 6.3*  ALBUMIN 2.9* 2.7*   No results for input(s): LIPASE, AMYLASE in the last 72 hours. Cardiac Enzymes No results for input(s): CKTOTAL, CKMB, CKMBINDEX, TROPONINI in the last 72 hours. BNP Invalid input(s): POCBNP D-Dimer No results for input(s): DDIMER in the last 72 hours. Hemoglobin A1C No results for input(s): HGBA1C in the last 72 hours. Fasting Lipid Panel No results for input(s):  CHOL, HDL, LDLCALC, TRIG, CHOLHDL, LDLDIRECT in the last 72 hours. Thyroid Function Tests No results for input(s): TSH, T4TOTAL, T3FREE, THYROIDAB in the last 72 hours.  Invalid input(s): FREET3  Telemetry    Atrial Fibrillation, HR in 80's - 120's.   ECG    No new tracings.   Cardiac Studies and Radiology    X-ray Chest Pa And Lateral  01/25/2016  CLINICAL DATA:  Hypertension. Preoperative cardiovascular evaluation EXAM: CHEST  2 VIEW COMPARISON:  January 24, 2016 FINDINGS: There is persistent minimal left base atelectasis. Lungs elsewhere clear. Heart is borderline enlarged with pulmonary vascularity within normal limits. No adenopathy. No bone lesions. IMPRESSION: Minimal left base atelectasis. No edema or consolidation. Heart borderline  prominent but stable. No new opacity. Electronically Signed   By: Bretta Bang III M.D.   On: 01/25/2016 07:31   Mr Pelvis W Wo Contrast  01/27/2016  CLINICAL DATA:  Possible perforated appendicitis. Evaluate prominent pancreatic duct and right adrenal gland nodule and ovarian cysts. EXAM: MRI ABDOMEN AND PELVIS WITHOUT AND WITH CONTRAST TECHNIQUE: Multiplanar multisequence MR imaging of the abdomen and pelvis was performed both before and after the administration of intravenous contrast. CONTRAST:  12 cc MultiHance. COMPARISON:  CT scan 01/24/2016 FINDINGS: COMBINED FINDINGS FOR BOTH MR ABDOMEN AND PELVIS Examination is quite limited due to respiratory motion and patient motion. Lower chest: The lung bases are grossly clear. No pleural or pericardial effusion. Hepatobiliary: No focal hepatic lesions or intrahepatic biliary dilatation. The gallbladder is mildly distended. No findings for acute cholecystitis. No definite gallstones. No common bile duct dilatation. Pancreas: No mass, inflammation ductal dilatation. Mild atrophy of the pancreatic tail. Spleen:  Normal size.  No focal lesions. Adrenals/Urinary Tract: Small bilateral adrenal gland nodules demonstrate loss of signal intensity on the out of phase T1 weighted gradient echo sequences most consistent with benign adenomas. The kidneys are unremarkable. Stomach/Bowel: The stomach, duodenum, small bowel and colon are grossly normal. The appendix is not well visualized due to motion artifact. Vascular/Lymphatic: No mesenteric or retroperitoneal mass or abnormal liver aorta is normal in caliber advanced atherosclerotic calcifications. Reproductive: The uterus is grossly normal. There is a simple appearing cyst associated with the right ovary measuring 2.4 cm. Serpiginous fluid collections near the left ovary likely hydrosalpinx. Other: Moderate distention of the bladder is noted. No pelvic mass or adenopathy. Presacral edema is no minimal fluid in the  cul-de-sac. Musculoskeletal:  Advanced degenerative changes involving the spine. IMPRESSION: 1. Very limited examination due to patient motion. 2. Small adrenal gland nodules are likely benign adenomas. 3. Suspect mild right lower quadrant inflammatory process but the appendix cystic well visualized. 4. Right ovarian cyst and suspect left-sided hydrosalpinx. 5. Bladder distention. 6. Grossly normal appearance of the pancreaticobiliary tree for age. Electronically Signed   By: Rudie Meyer M.D.   On: 01/27/2016 13:53   Mr Abdomen W Wo Contrast  01/27/2016  CLINICAL DATA:  Possible perforated appendicitis. Evaluate prominent pancreatic duct and right adrenal gland nodule and ovarian cysts. EXAM: MRI ABDOMEN AND PELVIS WITHOUT AND WITH CONTRAST TECHNIQUE: Multiplanar multisequence MR imaging of the abdomen and pelvis was performed both before and after the administration of intravenous contrast. CONTRAST:  12 cc MultiHance. COMPARISON:  CT scan 01/24/2016 FINDINGS: COMBINED FINDINGS FOR BOTH MR ABDOMEN AND PELVIS Examination is quite limited due to respiratory motion and patient motion. Lower chest: The lung bases are grossly clear. No pleural or pericardial effusion. Hepatobiliary: No focal hepatic lesions or intrahepatic biliary dilatation. The  gallbladder is mildly distended. No findings for acute cholecystitis. No definite gallstones. No common bile duct dilatation. Pancreas: No mass, inflammation ductal dilatation. Mild atrophy of the pancreatic tail. Spleen:  Normal size.  No focal lesions. Adrenals/Urinary Tract: Small bilateral adrenal gland nodules demonstrate loss of signal intensity on the out of phase T1 weighted gradient echo sequences most consistent with benign adenomas. The kidneys are unremarkable. Stomach/Bowel: The stomach, duodenum, small bowel and colon are grossly normal. The appendix is not well visualized due to motion artifact. Vascular/Lymphatic: No mesenteric or retroperitoneal mass or  abnormal liver aorta is normal in caliber advanced atherosclerotic calcifications. Reproductive: The uterus is grossly normal. There is a simple appearing cyst associated with the right ovary measuring 2.4 cm. Serpiginous fluid collections near the left ovary likely hydrosalpinx. Other: Moderate distention of the bladder is noted. No pelvic mass or adenopathy. Presacral edema is no minimal fluid in the cul-de-sac. Musculoskeletal:  Advanced degenerative changes involving the spine. IMPRESSION: 1. Very limited examination due to patient motion. 2. Small adrenal gland nodules are likely benign adenomas. 3. Suspect mild right lower quadrant inflammatory process but the appendix cystic well visualized. 4. Right ovarian cyst and suspect left-sided hydrosalpinx. 5. Bladder distention. 6. Grossly normal appearance of the pancreaticobiliary tree for age. Electronically Signed   By: Rudie Meyer M.D.   On: 01/27/2016 13:53   Ct Abdomen Pelvis W Contrast  01/24/2016  CLINICAL DATA:  Left lower quadrant abdominal pain. EXAM: CT ABDOMEN AND PELVIS WITH CONTRAST TECHNIQUE: Multidetector CT imaging of the abdomen and pelvis was performed using the standard protocol following bolus administration of intravenous contrast. CONTRAST:  ISOVUE-300 IOPAMIDOL (ISOVUE-300) INJECTION 61% COMPARISON:  None. FINDINGS: Normal normal lung bases. The appendix is abnormal in appearance at its distal tip. The proximal 90% of the appendix enhances normally. However, there is decreased enhancement at the distal tip with some adjacent stranding and discontinuity of mucosal enhancement. The distal appendix is also mildly dilated measuring between 8 or 9 mm. There are a few dots of free air adjacent to the distal tip is well. No other free air identified. There is a small amount of free fluid in the pelvis on the right. Cholelithiasis is identified. The liver, portal veins, and spleen are normal. The left adrenal gland is normal. There is an  enhancing nodule in the right adrenal gland measuring 12 mm. Mild prominence of the pancreatic duct with no obstructing mass or cause identified. The common bile duct is normal in caliber. The pancreas is otherwise normal. A few small renal cysts are seen on the left. A few are too small to characterize. No suspicious renal masses. No ureterectasis or ureteral stones. There are regions of decreased cortical enhancement on the left with no adjacent stranding. This becomes more prominent on delayed images. No filling defects in the upper renal collecting systems. The stomach is normal. There are multiple prominent mildly dilated loops of small bowel with intervening loops of normal caliber small bowel. Given the findings in the right lower quadrant, this is favored to represent developing ileus. The colon is unremarkable. There is a non aneurysmal atherosclerotic aorta. No adenopathy. No adenopathy or suspicious mass in the pelvis. The uterus and right ovary are normal. There appear to be several cysts in the left ovary with the largest measuring up to 2.5 cm. This is asymmetric to the right. No other abnormalities are seen within the pelvis. Delayed images through the upper kidney demonstrate no filling defects in the  renal collecting system. IMPRESSION: 1. The findings are consistent with a perforated appendicitis. The appearance is somewhat unusual with lack of enhancement at the distal tip and discontinuity of the mucosa. While this could represent a typical appendicitis, appendicitis due to a mass at the distal tip is not completely excluded. Recommend correlation with pathology. 2. Striated nephrogram on the left with no adjacent stranding. This is an age-indeterminate finding but pyelonephritis is not excluded given the history of left-sided pain. Recommend correlation with urinalysis and clinical exam. 3. 12 mm enhancing nodule in the right adrenal gland. Recommend MRI for further characterization. 4. Probable  developing small-bowel ileus. 5. Multiple cysts in the left ovary with the largest measuring up to 2.5 cm. This is a somewhat unusual appearance in a postmenopausal woman. Recommend correlation with ultrasound for better assessment of cyst character and size. Findings called to Dr. Clydene Pugh. Electronically Signed   By: Gerome Sam III M.D   On: 01/24/2016 14:46   Dg Abd Acute W/chest  01/24/2016  CLINICAL DATA:  RIGHT lower quadrant pain of unspecified duration EXAM: DG ABDOMEN ACUTE W/ 1V CHEST COMPARISON:  Chest radiograph 12/08/2010 FINDINGS: Enlargement of cardiac silhouette. Atherosclerotic calcification aorta. Mediastinal contours and pulmonary vascularity otherwise normal for technique. Suspected RIGHT nipple shadow, with LEFT nipple shadow projecting external to the costal margin. No definite acute infiltrate, pleural effusion or pneumothorax. Minimal subsegmental atelectasis at LEFT base. Prominent small bowel loops in mid abdomen with lesser degrees of colonic gas are seen scattered throughout the colon. No definite bowel wall thickening or free intraperitoneal air. Bones demineralized with degenerative changes lumbar spine. No urinary tract calcification. IMPRESSION: Enlargement of cardiac silhouette with minimal LEFT basilar atelectasis. Air-filled small bowel loops in the mid abdomen prominent in number and size though some gas is seen throughout the colon ; this could represent mild small bowel ileus but early versus partial small-bowel obstruction not completely excluded. Electronically Signed   By: Ulyses Southward M.D.   On: 01/24/2016 13:04    Echocardiogram: 01/25/2016 Study Conclusions - Left ventricle: The cavity size was normal. Wall thickness was  normal. Systolic function was normal. The estimated ejection  fraction was in the range of 60% to 65%. Wall motion was normal;  there were no regional wall motion abnormalities. Features are  consistent with a pseudonormal left ventricular  filling pattern,  with concomitant abnormal relaxation and increased filling  pressure (grade 2 diastolic dysfunction). - Aortic valve: There was mild to moderate regurgitation. - Mitral valve: There was mild regurgitation. - Tricuspid valve: There was mild-moderate regurgitation. - Pulmonary arteries: Systolic pressure was mildly increased. PA  peak pressure: 33 mm Hg (S).  Assessment & Plan    1. Atrial Fibrillation with RVR - has a history of PAF. On 01/25/2016, she was noted to have gone into atrial fibrillation with RVR, with HR in the 120's - 140's. She was given IV Cardizem 5mg  and converted to NSR on 01/26/2016 at 0430. Went back into atrial fibrillation overnight on 01/27/2016. - This patients CHA2DS2-VASc Score and unadjusted Ischemic Stroke Rate (% per year) is equal to 9.7 % stroke rate/year from a score of 6 (CHF, HTN, Female, Age, CVA (2)). Was on Coumadin PTA but had not taken for 20+ days due to running out of the medication and not having transportation to office appointments. Currently on Heparin. Restart oral anticoagulation prior to discharge.  - started on IV Cardizem and rate currently controlled in the 80's. Would recommend switching to short-acting  PO Cardizem tomorrow if HR remains well controlled then Cardizem CD. She was not on any rate-controlling medications prior to admission, but with her acute illness, she is likely to continue to go in and out of PAF until this resolves.   2. EKG Changes - EKG shows new TWI in inferior and lateral leads (more prominent on tracings this admission when in NSR).  - she denies any recent chest pain or anginal equivalents.  - Echo this admission shows a preserved EF of 60-65% with no wall motion abnormalities. - could consider outpatient NST if she developed anginal symptoms.  3. Chronic Diastolic CHF - echo this admission shows an EF of 60-65% with Grade 2 DD. - does not appear volume overloaded on physical exam.  4. HLD -  continue statin therapy.   5. Abdominal Pain/ Possible perforated Appendicitis/ C.diff antigen Positive - Abdominal CT on admission showed findings consistent with a perforated appendicitis, a 12 mm enhancing nodule in the right adrenal gland, probable developing small-bowel ileus, and multiple cysts in the left ovary with the largest measuring up to 2.5 cm.  - Surgery was consulted and noted the CT was concerning for a mass in the cecum. MRI performed today. Results pending. - per admitting team and GI.   Signed, Ellsworth Lennox , PA-C 3:11 PM 01/27/2016 Pager: [phone]   I have examined the patient and reviewed assessment and plan and discussed with patient.  Agree with above as stated.  IV cardizem for AFib. Continue IV meds if she is NPO due to her GI issues.    Lance Muss

## 2016-01-27 NOTE — Progress Notes (Addendum)
Pt went into afib rvr, other vitals signs are stable, Donnamarie PoagK. Kirby NP notified and place a order for 5mg  cardizem IV push, will call back in 45min if rate is greater than 105.

## 2016-01-27 NOTE — Progress Notes (Signed)
Pt still in afib with rate in 120's and 150's, call K. Craige CottaKirby and she put in a order for Cardizem drip. It was started.

## 2016-01-27 NOTE — Progress Notes (Signed)
  Subjective: Pain is better just a 3/10, much less tender than the last couple days.  Still having diarrhea.  Interpreter is here this AM and she is going down for MRI.  Objective: Vital signs in last 24 hours: Temp:  [97.9 F (36.6 C)-98.6 F (37 C)] 98.6 F (37 C) (06/08 0305) Pulse Rate:  [43-132] 43 (06/08 0800) Resp:  [12-22] 12 (06/08 0800) BP: (114-155)/(58-89) 126/89 mmHg (06/08 0800) SpO2:  [96 %-100 %] 96 % (06/08 0800) Last BM Date: 01/26/16  Intake/Output from previous day: 06/07 0701 - 06/08 0700 In: 2464.3 [I.V.:2314.3; IV Piggyback:150] Out: 1100 [Urine:1100] Intake/Output this shift: Total I/O In: -  Out: 500 [Urine:500]  General appearance: alert, cooperative and no distress GI: soft, pain is still mostly in the LLQ.    Lab Results:   Recent Labs  01/26/16 0422 01/27/16 0352  WBC 17.6* 15.1*  HGB 12.1 11.4*  HCT 36.7 34.4*  PLT 260 258    BMET  Recent Labs  01/26/16 0422 01/27/16 0352  NA 134* 134*  K 5.1 4.3  CL 107 107  CO2 17* 18*  GLUCOSE 176* 82  BUN <5* 5*  CREATININE 0.56 0.69  CALCIUM 8.4* 8.6*   PT/INR  Recent Labs  01/24/16 1549 01/25/16 0413  LABPROT 17.2* 15.5*  INR 1.39 1.22     Recent Labs Lab 01/24/16 1135 01/25/16 0413 01/26/16 0422  AST 21 18 18   ALT 28 26 22   ALKPHOS 67 65 72  BILITOT 1.4* 1.8* 1.2  PROT 6.4* 6.1* 6.3*  ALBUMIN 3.4* 2.9* 2.7*     Lipase     Component Value Date/Time   LIPASE 20 01/24/2016 1135     Studies/Results: No results found.  Medications: . amLODipine  10 mg Oral Daily  . atorvastatin  20 mg Oral Daily  . diltiazem (CARDIZEM) infusion      . lisinopril  10 mg Oral Daily  . nicotine  14 mg Transdermal Daily  . piperacillin-tazobactam (ZOSYN)  IV  3.375 g Intravenous Q8H  . sodium chloride flush  3 mL Intravenous Q12H  . vancomycin  125 mg Oral Q6H   . sodium chloride 100 mL/hr at 01/27/16 0603  . diltiazem (CARDIZEM) infusion 10 mg/hr (01/27/16 0641)  .  heparin 950 Units/hr (01/27/16 0641)   Abnormal EKG  Assessment/Plan Perforated appendix vs possible cecal mass  Diarrhea C diff antigen positive/toxin negative, I ordered PCR, BUT no results so far- now on oral Vancomycin AF on chronic coumadin - now on heparin and cardizem Hypokalemia - resolved  FEN: NPO/IV fluids ID: Day 3 Zosyn DVT: SCD/add heparin this AM, will defer to Medicine on increased anticoagulation.  Plan:  Continue medical management and MRI this AM.  Going down now.  PCR is still in the order set.       LOS: 3 days    Jackson Fetters 01/27/2016 [phone]

## 2016-01-27 NOTE — Progress Notes (Signed)
ANTICOAGULATION CONSULT NOTE - f/u Consult  Pharmacy Consult for heparin Indication: atrial fibrillation  No Known Allergies  Patient Measurements: Height: 5' (152.4 cm) Weight: 122 lb 12.7 oz (55.7 kg) IBW/kg (Calculated) : 45.5 Heparin Dosing Weight: 55.7 kg  Vital Signs: Temp: 98.6 F (37 C) (06/08 0305) Temp Source: Oral (06/08 0305) BP: 144/58 mmHg (06/08 0305) Pulse Rate: 69 (06/08 0305)  Labs:  Recent Labs  01/24/16 1549 01/25/16 0413 01/25/16 0724 01/26/16 0422 01/26/16 2034 01/27/16 0352  HGB  --  11.1*  --  12.1  --  11.4*  HCT  --  34.0*  --  36.7  --  34.4*  PLT  --  210  --  260  --  258  APTT 38*  --   --   --   --   --   LABPROT 17.2* 15.5*  --   --   --   --   INR 1.39 1.22  --   --   --   --   HEPARINUNFRC  --   --   --   --  0.31 0.23*  CREATININE  --  0.84 0.84 0.56  --  0.69    Estimated Creatinine Clearance: 48.3 mL/min (by C-G formula based on Cr of 0.69).   Medical History: Past Medical History  Diagnosis Date  . Hypertension   . Hyperlipidemia   . Tobacco abuse     Ongoing (3-6 cigarettes per day, 25 pack year history)  . History of CVA (cerebrovascular accident)     New diagnosis December 08, 2010  . Atrial fibrillation (HCC)     dx in setting of stroke 4/12;  echo with EF 60-65%, mild LVH, mild AI, grade 1 diast dysfxn  . Impaired glucose tolerance 05/08/2011  . Hx of cardiovascular stress test     a. Myoview 8/12:  EF 69%, no ischemia.   . Coronary artery disease     Assessment: [age] yoF on warfarin PTA for Afib. Warfarin has been on hold for abdominal pain workup and INR is 1.22 today. Pharmacy consulted to start heparin drip.   HL now low on 850 units/h. CBC stable. No bleeding or IV line issues per RN.  Goal of Therapy:  Heparin level 0.3-0.7 units/ml Monitor platelets by anticoagulation protocol: Yes   Plan:  Increase IV heparin to 950 units/h 8h HL Daily HL and CBC Monitor s/sx bleeding  Babs BertinHaley Bryer Gottsch, PharmD,  [facility] Center[facility]PS Clinical Pharmacist Pager [phone] 01/27/2016 5:49 AM

## 2016-01-28 LAB — BASIC METABOLIC PANEL
ANION GAP: 12 (ref 5–15)
BUN: 6 mg/dL (ref 6–20)
CALCIUM: 8.3 mg/dL — AB (ref 8.9–10.3)
CO2: 15 mmol/L — AB (ref 22–32)
CREATININE: 0.76 mg/dL (ref 0.44–1.00)
Chloride: 107 mmol/L (ref 101–111)
Glucose, Bld: 87 mg/dL (ref 65–99)
Potassium: 4 mmol/L (ref 3.5–5.1)
SODIUM: 134 mmol/L — AB (ref 135–145)

## 2016-01-28 LAB — HEPARIN LEVEL (UNFRACTIONATED)
HEPARIN UNFRACTIONATED: 0.52 [IU]/mL (ref 0.30–0.70)
HEPARIN UNFRACTIONATED: 0.34 [IU]/mL (ref 0.30–0.70)
HEPARIN UNFRACTIONATED: 0.3 [IU]/mL (ref 0.30–0.70)

## 2016-01-28 LAB — CBC
HCT: 35.2 % — ABNORMAL LOW (ref 36.0–46.0)
HEMOGLOBIN: 11.9 g/dL — AB (ref 12.0–15.0)
MCH: 28.5 pg (ref 26.0–34.0)
MCHC: 33.8 g/dL (ref 30.0–36.0)
MCV: 84.4 fL (ref 78.0–100.0)
PLATELETS: 303 10*3/uL (ref 150–400)
RBC: 4.17 MIL/uL (ref 3.87–5.11)
RDW: 12.6 % (ref 11.5–15.5)
WBC: 15 10*3/uL — ABNORMAL HIGH (ref 4.0–10.5)

## 2016-01-28 LAB — PROTIME-INR
INR: 1.13 (ref 0.00–1.49)
PROTHROMBIN TIME: 14.7 s (ref 11.6–15.2)

## 2016-01-28 MED ORDER — DILTIAZEM HCL 60 MG PO TABS
60.0000 mg | ORAL_TABLET | Freq: Three times a day (TID) | ORAL | Status: DC
  Administered 2016-01-28 – 2016-01-30 (×5): 60 mg via ORAL
  Filled 2016-01-28 (×5): qty 1

## 2016-01-28 MED ORDER — WARFARIN SODIUM 5 MG PO TABS
5.0000 mg | ORAL_TABLET | Freq: Once | ORAL | Status: AC
  Administered 2016-01-28: 5 mg via ORAL
  Filled 2016-01-28: qty 1

## 2016-01-28 MED ORDER — WARFARIN - PHARMACIST DOSING INPATIENT
Freq: Every day | Status: DC
  Administered 2016-01-28: 18:00:00

## 2016-01-28 NOTE — Progress Notes (Signed)
Daily Rounding Note  01/28/2016, 1:47 PM  LOS: 4 days   SUBJECTIVE:       Denies abdominal pain.  Last analgesics on 6/7  Tolerating full liquids.   OBJECTIVE:         Vital signs in last 24 hours:    Temp:  [97.6 F (36.4 C)-98.7 F (37.1 C)] 98 F (36.7 C) (06/09 1200) Pulse Rate:  [57-88] 65 (06/09 1135) Resp:  [13-19] 16 (06/09 1135) BP: (115-157)/(48-100) 157/61 mmHg (06/09 1135) SpO2:  [97 %-100 %] 100 % (06/09 1135) Last BM Date: 01/27/16 Filed Weights   01/24/16 1956  Weight: 55.7 kg (122 lb 12.7 oz)   General: pleasant, comfortable   Heart: Irreg, irreg Chest: clear.  Non-labored breathing.  Abdomen: soft, active BS.  NT, ND  Extremities: no CCE Neuro/Psych:  Sleepy, no obvious confusion.  No gross deficits.   Lab Results:  Recent Labs  01/26/16 0422 01/27/16 0352 01/28/16 0046  WBC 17.6* 15.1* 15.0*  HGB 12.1 11.4* 11.9*  HCT 36.7 34.4* 35.2*  PLT 260 258 303   BMET  Recent Labs  01/26/16 0422 01/27/16 0352 01/28/16 0046  NA 134* 134* 134*  K 5.1 4.3 4.0  CL 107 107 107  CO2 17* 18* 15*  GLUCOSE 176* 82 87  BUN <5* 5* 6  CREATININE 0.56 0.69 0.76  CALCIUM 8.4* 8.6* 8.3*   LFT  Recent Labs  01/26/16 0422  PROT 6.3*  ALBUMIN 2.7*  AST 18  ALT 22  ALKPHOS 72  BILITOT 1.2   PT/INR  Recent Labs  01/28/16 1148  LABPROT 14.7  INR 1.13    Studies/Results: Mr Pelvis W Wo Contrast  01/27/2016  CLINICAL DATA:  Possible perforated appendicitis. Evaluate prominent pancreatic duct and right adrenal gland nodule and ovarian cysts. EXAM: MRI ABDOMEN AND PELVIS WITHOUT AND WITH CONTRAST TECHNIQUE: Multiplanar multisequence MR imaging of the abdomen and pelvis was performed both before and after the administration of intravenous contrast. CONTRAST:  12 cc MultiHance. COMPARISON:  CT scan 01/24/2016 FINDINGS: COMBINED FINDINGS FOR BOTH MR ABDOMEN AND PELVIS Examination is quite limited  due to respiratory motion and patient motion. Lower chest: The lung bases are grossly clear. No pleural or pericardial effusion. Hepatobiliary: No focal hepatic lesions or intrahepatic biliary dilatation. The gallbladder is mildly distended. No findings for acute cholecystitis. No definite gallstones. No common bile duct dilatation. Pancreas: No mass, inflammation ductal dilatation. Mild atrophy of the pancreatic tail. Spleen:  Normal size.  No focal lesions. Adrenals/Urinary Tract: Small bilateral adrenal gland nodules demonstrate loss of signal intensity on the out of phase T1 weighted gradient echo sequences most consistent with benign adenomas. The kidneys are unremarkable. Stomach/Bowel: The stomach, duodenum, small bowel and colon are grossly normal. The appendix is not well visualized due to motion artifact. Vascular/Lymphatic: No mesenteric or retroperitoneal mass or abnormal liver aorta is normal in caliber advanced atherosclerotic calcifications. Reproductive: The uterus is grossly normal. There is a simple appearing cyst associated with the right ovary measuring 2.4 cm. Serpiginous fluid collections near the left ovary likely hydrosalpinx. Other: Moderate distention of the bladder is noted. No pelvic mass or adenopathy. Presacral edema is no minimal fluid in the cul-de-sac. Musculoskeletal:  Advanced degenerative changes involving the spine. IMPRESSION: 1. Very limited examination due to patient motion. 2. Small adrenal gland nodules are likely benign adenomas. 3. Suspect mild right lower quadrant inflammatory process but the appendix cystic well visualized. 4. Right  ovarian cyst and suspect left-sided hydrosalpinx. 5. Bladder distention. 6. Grossly normal appearance of the pancreaticobiliary tree for age. Electronically Signed   By: Rudie MeyerP.  Gallerani M.D.   On: 01/27/2016 13:53   Mr Abdomen W Wo Contrast  01/27/2016  CLINICAL DATA:  Possible perforated appendicitis. Evaluate prominent pancreatic duct and  right adrenal gland nodule and ovarian cysts. EXAM: MRI ABDOMEN AND PELVIS WITHOUT AND WITH CONTRAST TECHNIQUE: Multiplanar multisequence MR imaging of the abdomen and pelvis was performed both before and after the administration of intravenous contrast. CONTRAST:  12 cc MultiHance. COMPARISON:  CT scan 01/24/2016 FINDINGS: COMBINED FINDINGS FOR BOTH MR ABDOMEN AND PELVIS Examination is quite limited due to respiratory motion and patient motion. Lower chest: The lung bases are grossly clear. No pleural or pericardial effusion. Hepatobiliary: No focal hepatic lesions or intrahepatic biliary dilatation. The gallbladder is mildly distended. No findings for acute cholecystitis. No definite gallstones. No common bile duct dilatation. Pancreas: No mass, inflammation ductal dilatation. Mild atrophy of the pancreatic tail. Spleen:  Normal size.  No focal lesions. Adrenals/Urinary Tract: Small bilateral adrenal gland nodules demonstrate loss of signal intensity on the out of phase T1 weighted gradient echo sequences most consistent with benign adenomas. The kidneys are unremarkable. Stomach/Bowel: The stomach, duodenum, small bowel and colon are grossly normal. The appendix is not well visualized due to motion artifact. Vascular/Lymphatic: No mesenteric or retroperitoneal mass or abnormal liver aorta is normal in caliber advanced atherosclerotic calcifications. Reproductive: The uterus is grossly normal. There is a simple appearing cyst associated with the right ovary measuring 2.4 cm. Serpiginous fluid collections near the left ovary likely hydrosalpinx. Other: Moderate distention of the bladder is noted. No pelvic mass or adenopathy. Presacral edema is no minimal fluid in the cul-de-sac. Musculoskeletal:  Advanced degenerative changes involving the spine. IMPRESSION: 1. Very limited examination due to patient motion. 2. Small adrenal gland nodules are likely benign adenomas. 3. Suspect mild right lower quadrant  inflammatory process but the appendix cystic well visualized. 4. Right ovarian cyst and suspect left-sided hydrosalpinx. 5. Bladder distention. 6. Grossly normal appearance of the pancreaticobiliary tree for age. Electronically Signed   By: Rudie MeyerP.  Gallerani M.D.   On: 01/27/2016 13:53   Scheduled Meds:   ASSESMENT:   *  Cecal inflammatory process.   MRI/MRCP right ovarian cyst and hydrosalpinx.  RLQ inflammatory process suspected.  Improving pain, fever.   WBCs still elevated day 5 Zosyn.     *  C diff negative diarrhea.   *  Afib with RVR.  Coumadin to restart today at 1800 , heparin gtt in plce.    PLAN   *  CT with oral, rectal and IV contrast, timing of this to be determined.   *  Advance diet to regular.      Jennye MoccasinSarah Gribbin  01/28/2016, 1:47 PM Pager: [phone]    [facility] Attending   I have taken an interval history, reviewed the chart and examined the patient. I agree with the Advanced Practitioner's note, impression and recommendations.   Will do CT tomorrow to reassess things and see if we get a better eval of the cecum/appendix Then decide about colonoscopy  Iva Booparl E. Mancel Lardizabal, MD, [facility] [phone] (pager) [phone] after 5 PM, weekends and holidays  01/28/2016 5:56 PM

## 2016-01-28 NOTE — Progress Notes (Signed)
Patient ID: Linda [name], female   DOB: Aug 05, 1941, 75 y.o.   MRN: [id number]     [facility]      [address] [place], [place] [address]    Phone: [phone] FAX: [phone]     Subjective: No n/v.  Diarrhea slowing down. Wbc unchanged.   Objective:  Vital signs:  Filed Vitals:   01/28/16 0400 01/28/16 0700 01/28/16 0731 01/28/16 0800  BP: 138/48 144/64  136/81  Pulse: 64 59  61  Temp:   98.1 F (36.7 C) 98.2 F (36.8 C)  TempSrc:   Oral Oral  Resp: _0 Height:      Weight:      SpO2: 99% 97%  98%    Last BM Date: 01/27/16  Intake/Output   Yesterday:  06/08 0701 - 06/09 0700 In: 2401.7 [I.V.:2351.7; IV Piggyback:50] Out: 2025 [Urine:2025] This shift: I/O last 3 completed shifts: In: 3723.7 [I.V.:3573.7; IV Piggyback:150] Out: 2725 [Urine:2725]    Physical Exam: General: Pt awake/alert/oriented x4 in no acute distress  Abdomen: Soft.  Nondistended. ttp suprapubic region. No evidence of peritonitis.  No incarcerated hernias.   Problem List:   Active Problems:   Hypertension   Hyperlipidemia   Smoker   Appendicitis   Syncope   Generalized abdominal pain   Abnormal computed tomography of cecum and terminal ileum   Diarrhea of presumed infectious origin   Lower abdominal pain   PAF (paroxysmal atrial fibrillation) (HCC)    Results:   Labs: Results for orders placed or performed during the hospital encounter of 01/24/16 (from the past 48 hour(s))  Heparin level (unfractionated)     Status: None   Collection Time: 01/26/16  8:34 PM  Result Value Ref Range   Heparin Unfractionated 0.31 0.30 - 0.70 IU/mL    Comment:        IF HEPARIN RESULTS ARE BELOW EXPECTED VALUES, AND PATIENT DOSAGE HAS BEEN CONFIRMED, SUGGEST FOLLOW UP TESTING OF ANTITHROMBIN III LEVELS.   Basic metabolic panel     Status: Abnormal   Collection Time: 01/27/16  3:52 AM  Result Value Ref Range   Sodium 134 (L) 135 -  145 mmol/L   Potassium 4.3 3.5 - 5.1 mmol/L   Chloride 107 101 - 111 mmol/L   CO2 18 (L) 22 - 32 mmol/L   Glucose, Bld 82 65 - 99 mg/dL   BUN 5 (L) 6 - 20 mg/dL   Creatinine, Ser 0.69 0.44 - 1.00 mg/dL   Calcium 8.6 (L) 8.9 - 10.3 mg/dL   GFR calc non Af Amer >60 >60 mL/min   GFR calc Af Amer >60 >60 mL/min    Comment: (NOTE) The eGFR has been calculated using the CKD EPI equation. This calculation has not been validated in all clinical situations. eGFR's persistently <60 mL/min signify possible Chronic Kidney Disease.    Anion gap 9 5 - 15  CBC     Status: Abnormal   Collection Time: 01/27/16  3:52 AM  Result Value Ref Range   WBC 15.1 (H) 4.0 - 10.5 K/uL   RBC 4.01 3.87 - 5.11 MIL/uL   Hemoglobin 11.4 (L) 12.0 - 15.0 g/dL   HCT 34.4 (L) 36.0 - 46.0 %   MCV 85.8 78.0 - 100.0 fL   MCH 28.4 26.0 - 34.0 pg   MCHC 33.1 30.0 - 36.0 g/dL   RDW 12.6 11.5 - 15.5 %   Platelets 258 150 - 400  K/uL  Magnesium     Status: None   Collection Time: 01/27/16  3:52 AM  Result Value Ref Range   Magnesium 1.9 1.7 - 2.4 mg/dL  Heparin level (unfractionated)     Status: Abnormal   Collection Time: 01/27/16  3:52 AM  Result Value Ref Range   Heparin Unfractionated 0.23 (L) 0.30 - 0.70 IU/mL    Comment:        IF HEPARIN RESULTS ARE BELOW EXPECTED VALUES, AND PATIENT DOSAGE HAS BEEN CONFIRMED, SUGGEST FOLLOW UP TESTING OF ANTITHROMBIN III LEVELS.   C difficile quick scan w PCR reflex     Status: Abnormal   Collection Time: 01/27/16 11:00 AM  Result Value Ref Range   C Diff antigen POSITIVE (A) NEGATIVE   C Diff toxin NEGATIVE NEGATIVE   C Diff interpretation      C. difficile present, but toxin not detected. This indicates colonization. In most cases, this does not require treatment. If patient has signs and symptoms consistent with colitis, consider treatment. Requires ENTERIC precautions.  Clostridium Difficile by PCR     Status: None   Collection Time: 01/27/16 11:00 AM  Result Value  Ref Range   Toxigenic C Difficile by pcr NEGATIVE NEGATIVE  Heparin level (unfractionated)     Status: Abnormal   Collection Time: 01/27/16  2:53 PM  Result Value Ref Range   Heparin Unfractionated 0.22 (L) 0.30 - 0.70 IU/mL    Comment:        IF HEPARIN RESULTS ARE BELOW EXPECTED VALUES, AND PATIENT DOSAGE HAS BEEN CONFIRMED, SUGGEST FOLLOW UP TESTING OF ANTITHROMBIN III LEVELS.   Heparin level (unfractionated)     Status: None   Collection Time: 01/28/16 12:46 AM  Result Value Ref Range   Heparin Unfractionated 0.30 0.30 - 0.70 IU/mL    Comment:        IF HEPARIN RESULTS ARE BELOW EXPECTED VALUES, AND PATIENT DOSAGE HAS BEEN CONFIRMED, SUGGEST FOLLOW UP TESTING OF ANTITHROMBIN III LEVELS.   Basic metabolic panel     Status: Abnormal   Collection Time: 01/28/16 12:46 AM  Result Value Ref Range   Sodium 134 (L) 135 - 145 mmol/L   Potassium 4.0 3.5 - 5.1 mmol/L   Chloride 107 101 - 111 mmol/L   CO2 15 (L) 22 - 32 mmol/L   Glucose, Bld 87 65 - 99 mg/dL   BUN 6 6 - 20 mg/dL   Creatinine, Ser 0.76 0.44 - 1.00 mg/dL   Calcium 8.3 (L) 8.9 - 10.3 mg/dL   GFR calc non Af Amer >60 >60 mL/min   GFR calc Af Amer >60 >60 mL/min    Comment: (NOTE) The eGFR has been calculated using the CKD EPI equation. This calculation has not been validated in all clinical situations. eGFR's persistently <60 mL/min signify possible Chronic Kidney Disease.    Anion gap 12 5 - 15  CBC     Status: Abnormal   Collection Time: 01/28/16 12:46 AM  Result Value Ref Range   WBC 15.0 (H) 4.0 - 10.5 K/uL   RBC 4.17 3.87 - 5.11 MIL/uL   Hemoglobin 11.9 (L) 12.0 - 15.0 g/dL   HCT 35.2 (L) 36.0 - 46.0 %   MCV 84.4 78.0 - 100.0 fL   MCH 28.5 26.0 - 34.0 pg   MCHC 33.8 30.0 - 36.0 g/dL   RDW 12.6 11.5 - 15.5 %   Platelets 303 150 - 400 K/uL    Imaging / Studies: Mr Pelvis W Wo  Contrast  01/27/2016  CLINICAL DATA:  Possible perforated appendicitis. Evaluate prominent pancreatic duct and right adrenal  gland nodule and ovarian cysts. EXAM: MRI ABDOMEN AND PELVIS WITHOUT AND WITH CONTRAST TECHNIQUE: Multiplanar multisequence MR imaging of the abdomen and pelvis was performed both before and after the administration of intravenous contrast. CONTRAST:  12 cc MultiHance. COMPARISON:  CT scan 01/24/2016 FINDINGS: COMBINED FINDINGS FOR BOTH MR ABDOMEN AND PELVIS Examination is quite limited due to respiratory motion and patient motion. Lower chest: The lung bases are grossly clear. No pleural or pericardial effusion. Hepatobiliary: No focal hepatic lesions or intrahepatic biliary dilatation. The gallbladder is mildly distended. No findings for acute cholecystitis. No definite gallstones. No common bile duct dilatation. Pancreas: No mass, inflammation ductal dilatation. Mild atrophy of the pancreatic tail. Spleen:  Normal size.  No focal lesions. Adrenals/Urinary Tract: Small bilateral adrenal gland nodules demonstrate loss of signal intensity on the out of phase T1 weighted gradient echo sequences most consistent with benign adenomas. The kidneys are unremarkable. Stomach/Bowel: The stomach, duodenum, small bowel and colon are grossly normal. The appendix is not well visualized due to motion artifact. Vascular/Lymphatic: No mesenteric or retroperitoneal mass or abnormal liver aorta is normal in caliber advanced atherosclerotic calcifications. Reproductive: The uterus is grossly normal. There is a simple appearing cyst associated with the right ovary measuring 2.4 cm. Serpiginous fluid collections near the left ovary likely hydrosalpinx. Other: Moderate distention of the bladder is noted. No pelvic mass or adenopathy. Presacral edema is no minimal fluid in the cul-de-sac. Musculoskeletal:  Advanced degenerative changes involving the spine. IMPRESSION: 1. Very limited examination due to patient motion. 2. Small adrenal gland nodules are likely benign adenomas. 3. Suspect mild right lower quadrant inflammatory process but  the appendix cystic well visualized. 4. Right ovarian cyst and suspect left-sided hydrosalpinx. 5. Bladder distention. 6. Grossly normal appearance of the pancreaticobiliary tree for age. Electronically Signed   By: Marijo Sanes M.D.   On: 01/27/2016 13:53   Mr Abdomen W Wo Contrast  01/27/2016  CLINICAL DATA:  Possible perforated appendicitis. Evaluate prominent pancreatic duct and right adrenal gland nodule and ovarian cysts. EXAM: MRI ABDOMEN AND PELVIS WITHOUT AND WITH CONTRAST TECHNIQUE: Multiplanar multisequence MR imaging of the abdomen and pelvis was performed both before and after the administration of intravenous contrast. CONTRAST:  12 cc MultiHance. COMPARISON:  CT scan 01/24/2016 FINDINGS: COMBINED FINDINGS FOR BOTH MR ABDOMEN AND PELVIS Examination is quite limited due to respiratory motion and patient motion. Lower chest: The lung bases are grossly clear. No pleural or pericardial effusion. Hepatobiliary: No focal hepatic lesions or intrahepatic biliary dilatation. The gallbladder is mildly distended. No findings for acute cholecystitis. No definite gallstones. No common bile duct dilatation. Pancreas: No mass, inflammation ductal dilatation. Mild atrophy of the pancreatic tail. Spleen:  Normal size.  No focal lesions. Adrenals/Urinary Tract: Small bilateral adrenal gland nodules demonstrate loss of signal intensity on the out of phase T1 weighted gradient echo sequences most consistent with benign adenomas. The kidneys are unremarkable. Stomach/Bowel: The stomach, duodenum, small bowel and colon are grossly normal. The appendix is not well visualized due to motion artifact. Vascular/Lymphatic: No mesenteric or retroperitoneal mass or abnormal liver aorta is normal in caliber advanced atherosclerotic calcifications. Reproductive: The uterus is grossly normal. There is a simple appearing cyst associated with the right ovary measuring 2.4 cm. Serpiginous fluid collections near the left ovary likely  hydrosalpinx. Other: Moderate distention of the bladder is noted. No pelvic mass or adenopathy. Presacral  edema is no minimal fluid in the cul-de-sac. Musculoskeletal:  Advanced degenerative changes involving the spine. IMPRESSION: 1. Very limited examination due to patient motion. 2. Small adrenal gland nodules are likely benign adenomas. 3. Suspect mild right lower quadrant inflammatory process but the appendix cystic well visualized. 4. Right ovarian cyst and suspect left-sided hydrosalpinx. 5. Bladder distention. 6. Grossly normal appearance of the pancreaticobiliary tree for age. Electronically Signed   By: Marijo Sanes M.D.   On: 01/27/2016 13:53    Medications / Allergies:  Scheduled Meds: . amLODipine  10 mg Oral Daily  . atorvastatin  20 mg Oral Daily  . lisinopril  10 mg Oral Daily  . nicotine  14 mg Transdermal Daily  . piperacillin-tazobactam (ZOSYN)  IV  3.375 g Intravenous Q8H  . sodium chloride flush  3 mL Intravenous Q12H   Continuous Infusions: . sodium chloride 100 mL/hr at 01/28/16 0000  . diltiazem (CARDIZEM) infusion 5 mg/hr (01/28/16 0000)  . heparin 1,150 Units/hr (01/28/16 0256)   PRN Meds:.acetaminophen **OR** acetaminophen, bisacodyl, hydrALAZINE, HYDROcodone-acetaminophen, magnesium citrate, morphine injection, ondansetron **OR** ondansetron (ZOFRAN) IV, senna-docusate, traZODone  Antibiotics: Anti-infectives    Start     Dose/Rate Route Frequency Ordered Stop   01/26/16 1900  vancomycin (VANCOCIN) 50 mg/mL oral solution 125 mg  Status:  Discontinued     125 mg Oral Every 6 hours 01/26/16 1842 01/27/16 1822   01/24/16 2230  piperacillin-tazobactam (ZOSYN) IVPB 3.375 g     3.375 g 12.5 mL/hr over 240 Minutes Intravenous Every 8 hours 01/24/16 2200     01/24/16 1500  piperacillin-tazobactam (ZOSYN) IVPB 3.375 g     3.375 g 100 mL/hr over 30 Minutes Intravenous  Once 01/24/16 1451 01/24/16 1538        Assessment/Plan Appendicitis versus cecal mass c diff  colitis -if no plans for a cscope, then recommend starting a diet.  Non surgical abdomen.  VTE prophylaxis-heparin gtt for hx AF FEN-NPO, may advance diet from a surgical standpoint.  Will leave up to GI.    Erby Pian, [facility] Pager [phone]) For consults and floor pages call [phone])  01/28/2016 9:42 AM

## 2016-01-28 NOTE — Progress Notes (Signed)
ANTICOAGULATION CONSULT NOTE - F/U Consult Pharmacy Consult for heparin>>warfarin Indication: atrial fibrillation  No Known Allergies  Patient Measurements: Height: 5' (152.4 cm) Weight: 122 lb 12.7 oz (55.7 kg) IBW/kg (Calculated) : 45.5 Heparin Dosing Weight: 55.7 kg  Vital Signs: Temp: 98.2 F (36.8 C) (06/09 0800) Temp Source: Oral (06/09 0800) BP: 139/52 mmHg (06/09 1000) Pulse Rate: 63 (06/09 1000)  Labs:  Recent Labs  01/26/16 0422  01/27/16 0352 01/27/16 1453 01/28/16 0046 01/28/16 0940  HGB 12.1  --  11.4*  --  11.9*  --   HCT 36.7  --  34.4*  --  35.2*  --   PLT 260  --  258  --  303  --   HEPARINUNFRC  --   < > 0.23* 0.22* 0.30 0.52  CREATININE 0.56  --  0.69  --  0.76  --   < > = values in this interval not displayed.  Estimated Creatinine Clearance: 48.3 mL/min (by C-G formula based on Cr of 0.76).   Medical History: Past Medical History  Diagnosis Date  . Hypertension   . Hyperlipidemia   . Tobacco abuse     Ongoing (3-6 cigarettes per day, 25 pack year history)  . History of CVA (cerebrovascular accident)     New diagnosis December 08, 2010  . Atrial fibrillation (HCC)     dx in setting of stroke 4/12;  echo with EF 60-65%, mild LVH, mild AI, grade 1 diast dysfxn  . Impaired glucose tolerance 05/08/2011  . Hx of cardiovascular stress test     a. Myoview 8/12:  EF 69%, no ischemia.   . Coronary artery disease    Assessment: [age] yoF on warfarin PTA for Afib, but was possibly not taking for over a year. Last anticoag visit was January 2015 and last cardiology visit was August 2015. Her granddaughter states she hasn't been taking it for over a year. Inpatient warfarin has been on hold for abdominal pain workup. Pharmacy consulted for heparin and warfarin dosing.  Last documented warfarin dose was 5 mg on wednesdays and 2.5 mg all other days.  HL this morning is 0.52 after rate increases over night and this morning. Last INR was 1.22 on 6/6. No issues  with infusion or sxs of bleeding per RN. Looks like no plans for surgical intervention at this point. Restarting warfarin today.  Goal of Therapy:  Heparin level 0.3-0.7 units/ml Monitor platelets by anticoagulation protocol: Yes   Plan:  Continue IV heparin 1150 units/h Check anti-Xa level in 8 hours and daily while on heparin Give warfarin 5 mg po x 1 Check baseline INR Monitor daily INR, CBC, clinical course, s/sx of bleed, PO intake, DDI   Thank you for allowing us to participate in this patients care. Signe Coltonya C Brenae Lasecki, PharmD Pager: [phone] 01/28/2016, 11:14 AM

## 2016-01-28 NOTE — Progress Notes (Signed)
Hospital Problem List     Active Problems:   Hypertension   Hyperlipidemia   Smoker   Appendicitis   Syncope   Generalized abdominal pain   Abnormal computed tomography of cecum and terminal ileum   Diarrhea of presumed infectious origin   Lower abdominal pain   PAF (paroxysmal atrial fibrillation) [facility])    Patient Profile:   Primary Cardiologist:Dr. Graciela Husbands  75 y.o. female w/ PMH of HTN, HLD, PAF (on Coumadin), CVA (2012), and tobacco abuse who presented to [facility] ED on 01/24/2016 for a syncopal event and abdominal pain.  Subjective   Interpreter Used: [phone]  Now taking PO medications. Diet advanced to full liquids. Reports palpitations.   Inpatient Medications    . amLODipine  10 mg Oral Daily  . atorvastatin  20 mg Oral Daily  . lisinopril  10 mg Oral Daily  . nicotine  14 mg Transdermal Daily  . piperacillin-tazobactam (ZOSYN)  IV  3.375 g Intravenous Q8H  . sodium chloride flush  3 mL Intravenous Q12H  . warfarin  5 mg Oral ONCE-1800  . Warfarin - Pharmacist Dosing Inpatient   Does not apply q1800    Vital Signs    Filed Vitals:   01/28/16 1135 01/28/16 1200 01/28/16 1300 01/28/16 1400  BP: 157/61 134/88 129/63 110/57  Pulse: 65 100 71 81  Temp: 98.7 F (37.1 C) 98 F (36.7 C)    TempSrc: Oral Oral    Resp: 16 27 17 23   Height:      Weight:      SpO2: 100%       Intake/Output Summary (Last 24 hours) at 01/28/16 1432 Last data filed at 01/28/16 1400  Gross per 24 hour  Intake 3866.49 ml  Output   2275 ml  Net 1591.49 ml   Filed Weights   01/24/16 1956  Weight: 122 lb 12.7 oz (55.7 kg)    Physical Exam    General: Well developed, well nourished, female appearing in no acute distress. Head: Normocephalic, atraumatic.  Neck: Supple without bruits, JVD not elevated. Lungs:  Resp regular and unlabored, CTA without wheezing or rales. Heart: Irregularly irregular, S1, S2, no S3, S4, or murmur; no rub. Abdomen: Soft, non-distended with  normoactive bowel sounds. No hepatomegaly. No rebound/guarding. No obvious abdominal masses. Tender to palpation along LUQ. Extremities: No clubbing, cyanosis, or edema. Distal pedal pulses are 2+ bilaterally. Neuro: Alert and oriented X 3. Moves all extremities spontaneously. Psych: Normal affect.  Labs    CBC  Recent Labs  01/27/16 0352 01/28/16 0046  WBC 15.1* 15.0*  HGB 11.4* 11.9*  HCT 34.4* 35.2*  MCV 85.8 84.4  PLT 258 303   Basic Metabolic Panel  Recent Labs  01/26/16 0422 01/27/16 0352 01/28/16 0046  NA 134* 134* 134*  K 5.1 4.3 4.0  CL 107 107 107  CO2 17* 18* 15*  GLUCOSE 176* 82 87  BUN <5* 5* 6  CREATININE 0.56 0.69 0.76  CALCIUM 8.4* 8.6* 8.3*  MG 1.7 1.9  --    Liver Function Tests  Recent Labs  01/26/16 0422  AST 18  ALT 22  ALKPHOS 72  BILITOT 1.2  PROT 6.3*  ALBUMIN 2.7*   No results for input(s): LIPASE, AMYLASE in the last 72 hours. Cardiac Enzymes No results for input(s): CKTOTAL, CKMB, CKMBINDEX, TROPONINI in the last 72 hours. BNP Invalid input(s): POCBNP D-Dimer No results for input(s): DDIMER in the last 72 hours. Hemoglobin A1C No results for input(s):  HGBA1C in the last 72 hours. Fasting Lipid Panel No results for input(s): CHOL, HDL, LDLCALC, TRIG, CHOLHDL, LDLDIRECT in the last 72 hours. Thyroid Function Tests No results for input(s): TSH, T4TOTAL, T3FREE, THYROIDAB in the last 72 hours.  Invalid input(s): FREET3  Telemetry    Converted to NSR overnight, HR in 50's. Now back in atrial fibrillation, HR in 90's - 110's..   ECG    No new tracings.   Cardiac Studies and Radiology    X-ray Chest Pa And Lateral  01/25/2016  CLINICAL DATA:  Hypertension. Preoperative cardiovascular evaluation EXAM: CHEST  2 VIEW COMPARISON:  January 24, 2016 FINDINGS: There is persistent minimal left base atelectasis. Lungs elsewhere clear. Heart is borderline enlarged with pulmonary vascularity within normal limits. No adenopathy. No bone  lesions. IMPRESSION: Minimal left base atelectasis. No edema or consolidation. Heart borderline prominent but stable. No new opacity. Electronically Signed   By: Bretta Bang III M.D.   On: 01/25/2016 07:31   Mr Pelvis W Wo Contrast  01/27/2016  CLINICAL DATA:  Possible perforated appendicitis. Evaluate prominent pancreatic duct and right adrenal gland nodule and ovarian cysts. EXAM: MRI ABDOMEN AND PELVIS WITHOUT AND WITH CONTRAST TECHNIQUE: Multiplanar multisequence MR imaging of the abdomen and pelvis was performed both before and after the administration of intravenous contrast. CONTRAST:  12 cc MultiHance. COMPARISON:  CT scan 01/24/2016 FINDINGS: COMBINED FINDINGS FOR BOTH MR ABDOMEN AND PELVIS Examination is quite limited due to respiratory motion and patient motion. Lower chest: The lung bases are grossly clear. No pleural or pericardial effusion. Hepatobiliary: No focal hepatic lesions or intrahepatic biliary dilatation. The gallbladder is mildly distended. No findings for acute cholecystitis. No definite gallstones. No common bile duct dilatation. Pancreas: No mass, inflammation ductal dilatation. Mild atrophy of the pancreatic tail. Spleen:  Normal size.  No focal lesions. Adrenals/Urinary Tract: Small bilateral adrenal gland nodules demonstrate loss of signal intensity on the out of phase T1 weighted gradient echo sequences most consistent with benign adenomas. The kidneys are unremarkable. Stomach/Bowel: The stomach, duodenum, small bowel and colon are grossly normal. The appendix is not well visualized due to motion artifact. Vascular/Lymphatic: No mesenteric or retroperitoneal mass or abnormal liver aorta is normal in caliber advanced atherosclerotic calcifications. Reproductive: The uterus is grossly normal. There is a simple appearing cyst associated with the right ovary measuring 2.4 cm. Serpiginous fluid collections near the left ovary likely hydrosalpinx. Other: Moderate distention of the  bladder is noted. No pelvic mass or adenopathy. Presacral edema is no minimal fluid in the cul-de-sac. Musculoskeletal:  Advanced degenerative changes involving the spine. IMPRESSION: 1. Very limited examination due to patient motion. 2. Small adrenal gland nodules are likely benign adenomas. 3. Suspect mild right lower quadrant inflammatory process but the appendix cystic well visualized. 4. Right ovarian cyst and suspect left-sided hydrosalpinx. 5. Bladder distention. 6. Grossly normal appearance of the pancreaticobiliary tree for age. Electronically Signed   By: Rudie Meyer M.D.   On: 01/27/2016 13:53   Mr Abdomen W Wo Contrast  01/27/2016  CLINICAL DATA:  Possible perforated appendicitis. Evaluate prominent pancreatic duct and right adrenal gland nodule and ovarian cysts. EXAM: MRI ABDOMEN AND PELVIS WITHOUT AND WITH CONTRAST TECHNIQUE: Multiplanar multisequence MR imaging of the abdomen and pelvis was performed both before and after the administration of intravenous contrast. CONTRAST:  12 cc MultiHance. COMPARISON:  CT scan 01/24/2016 FINDINGS: COMBINED FINDINGS FOR BOTH MR ABDOMEN AND PELVIS Examination is quite limited due to respiratory motion and patient motion.  Lower chest: The lung bases are grossly clear. No pleural or pericardial effusion. Hepatobiliary: No focal hepatic lesions or intrahepatic biliary dilatation. The gallbladder is mildly distended. No findings for acute cholecystitis. No definite gallstones. No common bile duct dilatation. Pancreas: No mass, inflammation ductal dilatation. Mild atrophy of the pancreatic tail. Spleen:  Normal size.  No focal lesions. Adrenals/Urinary Tract: Small bilateral adrenal gland nodules demonstrate loss of signal intensity on the out of phase T1 weighted gradient echo sequences most consistent with benign adenomas. The kidneys are unremarkable. Stomach/Bowel: The stomach, duodenum, small bowel and colon are grossly normal. The appendix is not well  visualized due to motion artifact. Vascular/Lymphatic: No mesenteric or retroperitoneal mass or abnormal liver aorta is normal in caliber advanced atherosclerotic calcifications. Reproductive: The uterus is grossly normal. There is a simple appearing cyst associated with the right ovary measuring 2.4 cm. Serpiginous fluid collections near the left ovary likely hydrosalpinx. Other: Moderate distention of the bladder is noted. No pelvic mass or adenopathy. Presacral edema is no minimal fluid in the cul-de-sac. Musculoskeletal:  Advanced degenerative changes involving the spine. IMPRESSION: 1. Very limited examination due to patient motion. 2. Small adrenal gland nodules are likely benign adenomas. 3. Suspect mild right lower quadrant inflammatory process but the appendix cystic well visualized. 4. Right ovarian cyst and suspect left-sided hydrosalpinx. 5. Bladder distention. 6. Grossly normal appearance of the pancreaticobiliary tree for age. Electronically Signed   By: Rudie Meyer M.D.   On: 01/27/2016 13:53   Ct Abdomen Pelvis W Contrast  01/24/2016  CLINICAL DATA:  Left lower quadrant abdominal pain. EXAM: CT ABDOMEN AND PELVIS WITH CONTRAST TECHNIQUE: Multidetector CT imaging of the abdomen and pelvis was performed using the standard protocol following bolus administration of intravenous contrast. CONTRAST:  ISOVUE-300 IOPAMIDOL (ISOVUE-300) INJECTION 61% COMPARISON:  None. FINDINGS: Normal normal lung bases. The appendix is abnormal in appearance at its distal tip. The proximal 90% of the appendix enhances normally. However, there is decreased enhancement at the distal tip with some adjacent stranding and discontinuity of mucosal enhancement. The distal appendix is also mildly dilated measuring between 8 or 9 mm. There are a few dots of free air adjacent to the distal tip is well. No other free air identified. There is a small amount of free fluid in the pelvis on the right. Cholelithiasis is  identified. The liver, portal veins, and spleen are normal. The left adrenal gland is normal. There is an enhancing nodule in the right adrenal gland measuring 12 mm. Mild prominence of the pancreatic duct with no obstructing mass or cause identified. The common bile duct is normal in caliber. The pancreas is otherwise normal. A few small renal cysts are seen on the left. A few are too small to characterize. No suspicious renal masses. No ureterectasis or ureteral stones. There are regions of decreased cortical enhancement on the left with no adjacent stranding. This becomes more prominent on delayed images. No filling defects in the upper renal collecting systems. The stomach is normal. There are multiple prominent mildly dilated loops of small bowel with intervening loops of normal caliber small bowel. Given the findings in the right lower quadrant, this is favored to represent developing ileus. The colon is unremarkable. There is a non aneurysmal atherosclerotic aorta. No adenopathy. No adenopathy or suspicious mass in the pelvis. The uterus and right ovary are normal. There appear to be several cysts in the left ovary with the largest measuring up to 2.5 cm. This is asymmetric  to the right. No other abnormalities are seen within the pelvis. Delayed images through the upper kidney demonstrate no filling defects in the renal collecting system. IMPRESSION: 1. The findings are consistent with a perforated appendicitis. The appearance is somewhat unusual with lack of enhancement at the distal tip and discontinuity of the mucosa. While this could represent a typical appendicitis, appendicitis due to a mass at the distal tip is not completely excluded. Recommend correlation with pathology. 2. Striated nephrogram on the left with no adjacent stranding. This is an age-indeterminate finding but pyelonephritis is not excluded given the history of left-sided pain. Recommend correlation with urinalysis and clinical exam. 3.  12 mm enhancing nodule in the right adrenal gland. Recommend MRI for further characterization. 4. Probable developing small-bowel ileus. 5. Multiple cysts in the left ovary with the largest measuring up to 2.5 cm. This is a somewhat unusual appearance in a postmenopausal woman. Recommend correlation with ultrasound for better assessment of cyst character and size. Findings called to Dr. Clydene Pugh. Electronically Signed   By: Gerome Sam III M.D   On: 01/24/2016 14:46   Dg Abd Acute W/chest  01/24/2016  CLINICAL DATA:  RIGHT lower quadrant pain of unspecified duration EXAM: DG ABDOMEN ACUTE W/ 1V CHEST COMPARISON:  Chest radiograph 12/08/2010 FINDINGS: Enlargement of cardiac silhouette. Atherosclerotic calcification aorta. Mediastinal contours and pulmonary vascularity otherwise normal for technique. Suspected RIGHT nipple shadow, with LEFT nipple shadow projecting external to the costal margin. No definite acute infiltrate, pleural effusion or pneumothorax. Minimal subsegmental atelectasis at LEFT base. Prominent small bowel loops in mid abdomen with lesser degrees of colonic gas are seen scattered throughout the colon. No definite bowel wall thickening or free intraperitoneal air. Bones demineralized with degenerative changes lumbar spine. No urinary tract calcification. IMPRESSION: Enlargement of cardiac silhouette with minimal LEFT basilar atelectasis. Air-filled small bowel loops in the mid abdomen prominent in number and size though some gas is seen throughout the colon ; this could represent mild small bowel ileus but early versus partial small-bowel obstruction not completely excluded. Electronically Signed   By: Ulyses Southward M.D.   On: 01/24/2016 13:04    Echocardiogram: 01/25/2016 Study Conclusions - Left ventricle: The cavity size was normal. Wall thickness was  normal. Systolic function was normal. The estimated ejection  fraction was in the range of 60% to 65%. Wall motion was normal;  there  were no regional wall motion abnormalities. Features are  consistent with a pseudonormal left ventricular filling pattern,  with concomitant abnormal relaxation and increased filling  pressure (grade 2 diastolic dysfunction). - Aortic valve: There was mild to moderate regurgitation. - Mitral valve: There was mild regurgitation. - Tricuspid valve: There was mild-moderate regurgitation. - Pulmonary arteries: Systolic pressure was mildly increased. PA  peak pressure: 33 mm Hg (S).  Assessment & Plan    1. Atrial Fibrillation with RVR - has a history of PAF. On 01/25/2016, she was noted to have gone into atrial fibrillation with RVR, with HR in the 120's - 140's. She was given IV Cardizem 5mg  and converted to NSR on 01/26/2016 at 0430. Went back into atrial fibrillation overnight on 01/27/2016. - This patients CHA2DS2-VASc Score and unadjusted Ischemic Stroke Rate (% per year) is equal to 9.7 % stroke rate/year from a score of 6 (CHF, HTN, Female, Age, CVA (2)). Was on Coumadin PTA but had not taken for 20+ days due to running out of the medication and not having transportation to office appointments. Currently on Heparin. To  be restarted on Coumadin tonight.Talked with Pharmacy about compliance with Coumadin and INR appointments. Would prefer NOAC for compliance but financial limitations might restrict this decision. Will ask Case Management to check cost of Xarelto and Eliquis. - started on IV Cardizem and rate currently in the mid-90's to 120's on 12.5mg /hr. Once drip able to be titrated down, will switch to short-acting PO Cardizem (tentatively to start tonight). Would transition to long-acting Cardizem CD once HR is stable.  2. EKG Changes - EKG shows new TWI in inferior and lateral leads (more prominent on tracings this admission when in NSR).  - she denies any recent chest pain or anginal equivalents.  - Echo this admission shows a preserved EF of 60-65% with no wall motion abnormalities. -  could consider outpatient NST if she developed anginal symptoms.  3. Chronic Diastolic CHF - echo this admission shows an EF of 60-65% with Grade 2 DD. - does not appear volume overloaded on physical exam.  4. HLD - continue statin therapy.   5. Abdominal Pain/ Possible perforated Appendicitis/ C.diff antigen Positive - Abdominal CT on admission showed findings consistent with a perforated appendicitis, a 12 mm enhancing nodule in the right adrenal gland, probable developing small-bowel ileus, and multiple cysts in the left ovary with the largest measuring up to 2.5 cm.  - Surgery was consulted and noted the CT was concerning for a mass in the cecum.  - per admitting team and GI.   Signed, Ellsworth LennoxBrittany M Strader , PA-C 2:32 PM 01/28/2016 Pager: [phone]  I have examined the patient and reviewed assessment and plan and discussed with patient.  Agree with above as stated.  Agree with starting oral Cardizem.  Currently resting comfortably and in NSR.   Lance MussJayadeep Morrison Carmack

## 2016-01-28 NOTE — Progress Notes (Signed)
Cardizem stopped at 0830, HR dipping into mid 50s.

## 2016-01-28 NOTE — Progress Notes (Addendum)
Patient ID: Linda [name], female   DOB: March 20, 1941, 75 y.o.   MRN: [id number]  PROGRESS NOTE    Linda [name]  WUJ:[id number] DOB: 03/07/1941 DOA: 01/24/2016  PCP: Webb Silversmith, NP   Brief Narrative:  75 y.o. female with HTN, HLD, Afib on Coumadin, Tobacco/COPD, h/o CVA, brought by EMS from urgent care facility due to loss of consciousness, abd pain with cramps, over 10 episodes of loose BM's. She thought she had food poisoning and was taking imodium but her symptoms have not improved.   In ED, CT abdomen and pelvis notable for questionable  perforated appendicitis. Incidental 12 mm enhancing nodule in the right adrenal gland.Probable developing small-bowel ileus. White count was 16.3. Lactic acid 0.9. Troponin 0.07. Zosyn started. She was seen by GI in consultation as well as surgery. MRI 01/27/2016 showed small adrenal gland nodules likely benign, mild right lower quadrant inflammatory process but appendix with visualized. No plans for surgical intervention at this point. We'll follow-up in GI recommendations.   Assessment & Plan:  Acute abdominal pain without melena / questionable perforated appendicitis  - CT abdomen and pelvis shows findings consistent with ? perforated appendicitis  - MRI 01/27/2016 showed small adrenal gland nodules likely benign, mild right lower quadrant inflammatory process but appendix with visualized - No plans for surgical intervention at this point. We'll follow-up on GI recommendations - Will advance diet provided GI does not plan to do colonoscopy today - Patient feels better this morning - Continue supportive care with fluids and antiemetics as needed - Transfer to telemetry floor today   Sepsis due to possible perforated appendicitis versus left pyelonephritis / Leukocytosis  - Sepsis criteria met on admission with T 100F, RR > 22, WBC 16 K, source ? Perforated appendicitis vs Left pyelonephritis as suggested per CT abd - Continue zosyn for now - Please  note because of diarrhea patient started on empiric vancomycin by mouth 6/8. Stool for C. difficile is positive for antigen only but not toxin which is indicating colonization rather than true infection. Repeat stool for C. difficile negative.  - Blood cultures negative so far  - Urine culture - no growth   Diarrhea - C.diff antigen positive, indicating colonization rather than true infection - Because of ongoing diarrhea she was started on by mouth vancomycin empirically 6/8 but since repeat C.diff negative vanco stopped 6/9  Metabolic acidosis - Likely due to diarrhea. Diarrhea improving - CO2 15  Syncope, chronic diastolic CHF - ECHO notable for stable EF 55 % with grade II diastolic CHF - No sign of volume overload on physical exam   Atrial Fibrillation CHA2DS2-VASc score 5 - Patient ranout all of her medications about 20 days prior to admission  - Has had atrial fibrillation with rapid ventricular rate on 66/ to 6/7 requiring Cardizem drip - She is on anti-coagulation with heparin drip.  - This morning heart rate in 50s so Cardizem drip stopped - Appreciate cardiology recommendations and following  Hypokalemia  - Due to GI losses - Supplemented and within normal limits - Magnesium WNL  Hyperlipidemia - Continue atorvastatin 20 mg daily  Adrenal mass, ovarian cyst  -Incidental 12 mm enhancing nodule in the right adrenal gland seen on CT - Based on MRI, likely benign nodules  Tobacco Abuse - Cessation counseling provided   DVT prophylaxis: On heparin drip  Code Status: Full  Family Communication: Family not at the bedside this am Disposition Plan: not stable for discharge at this point,unclear at which point  she will be ready for discharge.  Consultants:   Surgery   GI  Cardiology   Procedures:   2 D ECHO 01/25/2016 - ejection fraction 60% with grade 2 diastolic dysfunction  Antimicrobials:  Zosyn 6/5 -->  Vanco PO 01/27/2016 -->  01/28/2016    Subjective: No overnight events.   Objective: Filed Vitals:   01/27/16 2328 01/28/16 0012 01/28/16 0315 01/28/16 0400  BP:  139/63  138/48  Pulse:  60  64  Temp: 98.7 F (37.1 C)  98.6 F (37 C)   TempSrc: Oral  Oral   Resp:  19  14  Height:      Weight:      SpO2:  97%  99%    Intake/Output Summary (Last 24 hours) at 01/28/16 0657 Last data filed at 01/28/16 0500  Gross per 24 hour  Intake   1561 ml  Output   1900 ml  Net   -339 ml   Filed Weights   01/24/16 1956  Weight: 55.7 kg (122 lb 12.7 oz)    Examination:  General exam: calm and comfortable    Respiratory system: Bilateral air entry, no wheezing  Cardiovascular system: S1 & S2 appreciated, rate controlled  Gastrointestinal system: (+) BS, non distended  Central nervous system: Nonfocal  Extremities: No swelling, palpable pulses  Skin: no lesions or ulcers  Psychiatry: No agitation, no restlessness   Data Reviewed: I have personally reviewed following labs and imaging studies  CBC:  Recent Labs Lab 01/24/16 1135 01/25/16 0413 01/26/16 0422 01/27/16 0352 01/28/16 0046  WBC 16.3* 15.3* 17.6* 15.1* 15.0*  NEUTROABS 13.1*  --   --   --   --   HGB 13.0 11.1* 12.1 11.4* 11.9*  HCT 38.7 34.0* 36.7 34.4* 35.2*  MCV 87.6 87.9 87.6 85.8 84.4  PLT 232 210 260 258 818   Basic Metabolic Panel:  Recent Labs Lab 01/25/16 0413 01/25/16 0724 01/25/16 1322 01/26/16 0422 01/27/16 0352 01/28/16 0046  NA 140 138  --  134* 134* 134*  K 2.9* 3.0*  --  5.1 4.3 4.0  CL 108 107  --  107 107 107  CO2 21* 20*  --  17* 18* 15*  GLUCOSE 98 90  --  176* 82 87  BUN 8 8  --  <5* 5* 6  CREATININE 0.84 0.84  --  0.56 0.69 0.76  CALCIUM 8.1* 7.9*  --  8.4* 8.6* 8.3*  MG  --   --  1.8 1.7 1.9  --    GFR: Estimated Creatinine Clearance: 48.3 mL/min (by C-G formula based on Cr of 0.76). Liver Function Tests:  Recent Labs Lab 01/24/16 1135 01/25/16 0413 01/26/16 0422  AST _0 ALT _1 ALKPHOS 67 65 72  BILITOT 1.4* 1.8* 1.2  PROT 6.4* 6.1* 6.3*  ALBUMIN 3.4* 2.9* 2.7*    Recent Labs Lab 01/24/16 1135  LIPASE 20   No results for input(s): AMMONIA in the last 168 hours. Coagulation Profile:  Recent Labs Lab 01/24/16 1549 01/25/16 0413  INR 1.39 1.22   Cardiac Enzymes: No results for input(s): CKTOTAL, CKMB, CKMBINDEX, TROPONINI in the last 168 hours. BNP (last 3 results) No results for input(s): PROBNP in the last 8760 hours. HbA1C: No results for input(s): HGBA1C in the last 72 hours. CBG:  Recent Labs Lab 01/24/16 1136  GLUCAP 133*   Lipid Profile: No results for input(s): CHOL, HDL, LDLCALC, TRIG, CHOLHDL, LDLDIRECT in the last 72 hours.  Thyroid Function Tests: No results for input(s): TSH, T4TOTAL, FREET4, T3FREE, THYROIDAB in the last 72 hours. Anemia Panel: No results for input(s): VITAMINB12, FOLATE, FERRITIN, TIBC, IRON, RETICCTPCT in the last 72 hours. Urine analysis:    Component Value Date/Time   COLORURINE AMBER* 01/24/2016 1940   APPEARANCEUR CLEAR 01/24/2016 1940   LABSPEC 1.020 01/24/2016 1940   PHURINE 5.5 01/24/2016 1940   GLUCOSEU NEGATIVE 01/24/2016 1940   HGBUR NEGATIVE 01/24/2016 1940   BILIRUBINUR SMALL* 01/24/2016 1940   KETONESUR 15* 01/24/2016 1940   PROTEINUR 30* 01/24/2016 1940   NITRITE NEGATIVE 01/24/2016 1940   LEUKOCYTESUR NEGATIVE 01/24/2016 1940   Sepsis Labs: _0 (procalcitonin:4,lacticidven:4)   Culture, blood (routine x 2)     Status: None (Preliminary result)   Collection Time: 01/24/16  2:54 PM  Result Value Ref Range Status   Specimen Description BLOOD RIGHT ANTECUBITAL  Final   Special Requests BOTTLES DRAWN AEROBIC AND ANAEROBIC 10CC  Final   Culture NO GROWTH 1 DAY  Final   Report Status PENDING  Incomplete  Culture, blood (routine x 2)     Status: None (Preliminary result)   Collection Time: 01/24/16  3:00 PM  Result Value Ref Range Status   Specimen Description BLOOD RIGHT HAND   Final   Special Requests BOTTLES DRAWN AEROBIC ONLY 10CC  Final   Culture NO GROWTH 1 DAY  Final   Report Status PENDING  Incomplete  MRSA PCR Screening     Status: None   Collection Time: 01/24/16  8:00 PM  Result Value Ref Range Status   MRSA by PCR NEGATIVE NEGATIVE Final  C difficile quick scan w PCR reflex     Status: Abnormal   Collection Time: 01/25/16  8:50 PM  Result Value Ref Range Status   C Diff antigen POSITIVE (A) NEGATIVE Final   C Diff toxin NEGATIVE NEGATIVE Final   C Diff interpretation   Final    C. difficile present, but toxin not detected. This indicates colonization. In most cases, this does not require treatment. If patient has signs and symptoms consistent with colitis, consider treatment. Requires ENTERIC precautions.      Radiology Studies: Mr Pelvis W Wo Contrast February 07, 2016  1. Very limited examination due to patient motion. 2. Small adrenal gland nodules are likely benign adenomas. 3. Suspect mild right lower quadrant inflammatory process but the appendix cystic well visualized. 4. Right ovarian cyst and suspect left-sided hydrosalpinx. 5. Bladder distention. 6. Grossly normal appearance of the pancreaticobiliary tree for age. Electronically Signed   By: Marijo Sanes M.D.   On: 2016/02/07 13:53   Mr Abdomen W Wo Contrast 02/07/16  1. Very limited examination due to patient motion. 2. Small adrenal gland nodules are likely benign adenomas. 3. Suspect mild right lower quadrant inflammatory process but the appendix cystic well visualized. 4. Right ovarian cyst and suspect left-sided hydrosalpinx. 5. Bladder distention. 6. Grossly normal appearance of the pancreaticobiliary tree for age. Electronically Signed   By: Marijo Sanes M.D.   On: 2016-02-07 13:53   X-ray Chest Pa And Lateral 01/25/2016  Minimal left base atelectasis. No edema or consolidation. Heart borderline prominent but stable. No new opacity. Electronically Signed   By: Lowella Grip III M.D.   On:  01/25/2016 07:31   Ct Abdomen Pelvis W Contrast 01/24/2016  1. The findings are consistent with a perforated appendicitis. The appearance is somewhat unusual with lack of enhancement at the distal tip and discontinuity of the mucosa. While this could represent  a typical appendicitis, appendicitis due to a mass at the distal tip is not completely excluded. Recommend correlation with pathology. 2. Striated nephrogram on the left with no adjacent stranding. This is an age-indeterminate finding but pyelonephritis is not excluded given the history of left-sided pain. Recommend correlation with urinalysis and clinical exam. 3. 12 mm enhancing nodule in the right adrenal gland. Recommend MRI for further characterization. 4. Probable developing small-bowel ileus. 5. Multiple cysts in the left ovary with the largest measuring up to 2.5 cm. This is a somewhat unusual appearance in a postmenopausal woman. Recommend correlation with ultrasound for better assessment of cyst character and size. Findings called to Dr. Laneta Simmers. Electronically Signed   By: Dorise Bullion III M.D   On: 01/24/2016 14:46   Dg Abd Acute W/chest              01/24/2016  Enlargement of cardiac silhouette with minimal LEFT basilar atelectasis. Air-filled small bowel loops in the mid abdomen prominent in number and size though some gas is seen throughout the colon ; this could represent mild small bowel ileus but early versus partial small-bowel obstruction not completely excluded. Electronically Signed   By: Lavonia Dana M.D.   On: 01/24/2016 13:04      Scheduled Meds: . amLODipine  10 mg Oral Daily  . atorvastatin  20 mg Oral Daily  . lisinopril  10 mg Oral Daily  . nicotine  14 mg Transdermal Daily  . piperacillin-tazobactam (ZOSYN)  IV  3.375 g Intravenous Q8H  . sodium chloride flush  3 mL Intravenous Q12H   Continuous Infusions: . sodium chloride 100 mL/hr at 01/28/16 0000  . diltiazem (CARDIZEM) infusion 5 mg/hr (01/28/16 0000)  .  heparin 1,150 Units/hr (01/28/16 0256)     LOS: 4 days    Time spent: 25 minutes Greater than 50% of the time spent on counseling and coordinating the care.   Leisa Lenz, MD [facility] Pager [phone]  If 7PM-7AM, please contact night-coverage [email or web address] Password [facility] 01/28/2016, 6:57 AM

## 2016-01-28 NOTE — Progress Notes (Signed)
ANTICOAGULATION CONSULT NOTE - Follow Up Consult  Pharmacy Consult for Heparin  Indication: atrial fibrillation  No Known Allergies  Patient Measurements: Height: 5' (152.4 cm) Weight: 122 lb 12.7 oz (55.7 kg) IBW/kg (Calculated) : 45.5 Vital Signs: Temp: 98.7 F (37.1 C) (06/08 2328) Temp Source: Oral (06/08 2328) BP: 139/63 mmHg (06/09 0012) Pulse Rate: 60 (06/09 0012)  Labs:  Recent Labs  01/25/16 0413 01/25/16 0724 01/26/16 0422  01/27/16 0352 01/27/16 1453 01/28/16 0046  HGB 11.1*  --  12.1  --  11.4*  --  11.9*  HCT 34.0*  --  36.7  --  34.4*  --  35.2*  PLT 210  --  260  --  258  --  303  LABPROT 15.5*  --   --   --   --   --   --   INR 1.22  --   --   --   --   --   --   HEPARINUNFRC  --   --   --   < > 0.23* 0.22* 0.30  CREATININE 0.84 0.84 0.56  --  0.69  --   --   < > = values in this interval not displayed.  Estimated Creatinine Clearance: 48.3 mL/min (by C-G formula based on Cr of 0.69).   Assessment: 75 y/o F on warfarin PTA, HL is 0.3 this AM, Hgb stable  Goal of Therapy:  Heparin level 0.3-0.7 units/ml Monitor platelets by anticoagulation protocol: Yes   Plan:  -Increase heparin to 1150 units/hr -1000 HL  Shannon Balthazar 01/28/2016,1:35 AM

## 2016-01-29 ENCOUNTER — Inpatient Hospital Stay (HOSPITAL_COMMUNITY): Payer: Medicare HMO

## 2016-01-29 DIAGNOSIS — D62 Acute posthemorrhagic anemia: Secondary | ICD-10-CM

## 2016-01-29 DIAGNOSIS — I10 Essential (primary) hypertension: Secondary | ICD-10-CM

## 2016-01-29 LAB — CULTURE, BLOOD (ROUTINE X 2)
CULTURE: NO GROWTH
CULTURE: NO GROWTH

## 2016-01-29 LAB — GLUCOSE, CAPILLARY: GLUCOSE-CAPILLARY: 230 mg/dL — AB (ref 65–99)

## 2016-01-29 LAB — ABO/RH: ABO/RH(D): B POS

## 2016-01-29 LAB — PROTIME-INR
INR: 1.27 (ref 0.00–1.49)
Prothrombin Time: 16 seconds — ABNORMAL HIGH (ref 11.6–15.2)

## 2016-01-29 LAB — HEMOGLOBIN AND HEMATOCRIT, BLOOD
HCT: 22.2 % — ABNORMAL LOW (ref 36.0–46.0)
HEMOGLOBIN: 7.5 g/dL — AB (ref 12.0–15.0)

## 2016-01-29 LAB — PREPARE RBC (CROSSMATCH)

## 2016-01-29 LAB — HEPARIN LEVEL (UNFRACTIONATED): HEPARIN UNFRACTIONATED: 0.28 [IU]/mL — AB (ref 0.30–0.70)

## 2016-01-29 MED ORDER — SODIUM CHLORIDE 0.9 % IV SOLN
Freq: Once | INTRAVENOUS | Status: AC
Start: 2016-01-29 — End: 2016-01-29
  Administered 2016-01-29: 03:00:00 via INTRAVENOUS

## 2016-01-29 MED ORDER — PANTOPRAZOLE SODIUM 40 MG IV SOLR
8.0000 mg/h | INTRAVENOUS | Status: DC
Start: 2016-01-29 — End: 2016-01-31
  Administered 2016-01-29 – 2016-01-31 (×6): 8 mg/h via INTRAVENOUS
  Filled 2016-01-29 (×12): qty 80

## 2016-01-29 MED ORDER — SODIUM CHLORIDE 0.9 % IV BOLUS (SEPSIS)
2000.0000 mL | Freq: Once | INTRAVENOUS | Status: AC
  Administered 2016-01-29: 2000 mL via INTRAVENOUS

## 2016-01-29 MED ORDER — PANTOPRAZOLE SODIUM 40 MG IV SOLR: 40.0000 mg | Freq: Two times a day (BID) | INTRAVENOUS | Status: DC

## 2016-01-29 MED ORDER — DIATRIZOATE MEGLUMINE & SODIUM 66-10 % PO SOLN
ORAL | Status: AC
  Filled 2016-01-29: qty 240

## 2016-01-29 MED ORDER — SODIUM CHLORIDE 0.9 % IV SOLN
80.0000 mg | Freq: Once | INTRAVENOUS | Status: AC
  Administered 2016-01-29: 80 mg via INTRAVENOUS
  Filled 2016-01-29: qty 80

## 2016-01-29 MED ORDER — IOPAMIDOL (ISOVUE-300) INJECTION 61%
INTRAVENOUS | Status: AC
  Administered 2016-01-29: 100 mL
  Filled 2016-01-29: qty 100

## 2016-01-29 MED ORDER — DIATRIZOATE MEGLUMINE & SODIUM 66-10 % PO SOLN
ORAL | Status: AC
  Administered 2016-01-29: 10:00:00
  Filled 2016-01-29: qty 30

## 2016-01-29 NOTE — Progress Notes (Signed)
SUBJECTIVE: does not speak English and interpreter not available at present  OBJECTIVE:   Vitals:   Filed Vitals:   01/29/16 0643 01/29/16 0730 01/29/16 0905 01/29/16 1127  BP: 112/59 105/57 110/56 135/60  Pulse: 67 64 63   Temp: 98.7 F (37.1 C) 98.9 F (37.2 C) 98.1 F (36.7 C) 97.8 F (36.6 C)  TempSrc: Oral Axillary Oral Oral  Resp: 18 16 19 21   Height:      Weight:      SpO2: 99% 99% 99% 100%   I&O's:   Intake/Output Summary (Last 24 hours) at 01/29/16 1319 Last data filed at 01/29/16 1200  Gross per 24 hour  Intake 6420.35 ml  Output   2300 ml  Net 4120.35 ml   TELEMETRY: Reviewed telemetry pt in atrial fibrillation with HR 80-110bpm:     PHYSICAL EXAM General: Well developed, well nourished, in no acute distress Head: Eyes PERRLA, No xanthomas.   Normal cephalic and atramatic  Lungs:   Clear bilaterally to auscultation and percussion. Heart:   irregularly irregular S1 S2 Pulses are 2+ & equal. Abdomen: Bowel sounds are positive, abdomen soft and non-tender without masses Msk:  Back normal, normal gait. Normal strength and tone for age. Extremities:   No clubbing, cyanosis or edema.  DP +1 Neuro: Alert and oriented X 3. Psych:  Good affect, responds appropriately   LABS: Basic Metabolic Panel:  Recent Labs  45/40/98 0352 01/28/16 0046  NA 134* 134*  K 4.3 4.0  CL 107 107  CO2 18* 15*  GLUCOSE 82 87  BUN 5* 6  CREATININE 0.69 0.76  CALCIUM 8.6* 8.3*  MG 1.9  --    Liver Function Tests: No results for input(s): AST, ALT, ALKPHOS, BILITOT, PROT, ALBUMIN in the last 72 hours. No results for input(s): LIPASE, AMYLASE in the last 72 hours. CBC:  Recent Labs  01/27/16 0352 01/28/16 0046 01/29/16 0206  WBC 15.1* 15.0*  --   HGB 11.4* 11.9* 7.5*  HCT 34.4* 35.2* 22.2*  MCV 85.8 84.4  --   PLT 258 303  --    Cardiac Enzymes: No results for input(s): CKTOTAL, CKMB, CKMBINDEX, TROPONINI in the last 72 hours. BNP: Invalid input(s):  POCBNP D-Dimer: No results for input(s): DDIMER in the last 72 hours. Hemoglobin A1C: No results for input(s): HGBA1C in the last 72 hours. Fasting Lipid Panel: No results for input(s): CHOL, HDL, LDLCALC, TRIG, CHOLHDL, LDLDIRECT in the last 72 hours. Thyroid Function Tests: No results for input(s): TSH, T4TOTAL, T3FREE, THYROIDAB in the last 72 hours.  Invalid input(s): FREET3 Anemia Panel: No results for input(s): VITAMINB12, FOLATE, FERRITIN, TIBC, IRON, RETICCTPCT in the last 72 hours. Coag Panel:   Lab Results  Component Value Date   INR 1.27 01/29/2016   INR 1.13 01/28/2016   INR 1.22 01/25/2016    RADIOLOGY: X-ray Chest Pa And Lateral  01/25/2016  CLINICAL DATA:  Hypertension. Preoperative cardiovascular evaluation EXAM: CHEST  2 VIEW COMPARISON:  January 24, 2016 FINDINGS: There is persistent minimal left base atelectasis. Lungs elsewhere clear. Heart is borderline enlarged with pulmonary vascularity within normal limits. No adenopathy. No bone lesions. IMPRESSION: Minimal left base atelectasis. No edema or consolidation. Heart borderline prominent but stable. No new opacity. Electronically Signed   By: Bretta Bang III M.D.   On: 01/25/2016 07:31   Mr Pelvis W Wo Contrast  01/27/2016  CLINICAL DATA:  Possible perforated appendicitis. Evaluate prominent pancreatic duct and right adrenal gland nodule and ovarian cysts.  EXAM: MRI ABDOMEN AND PELVIS WITHOUT AND WITH CONTRAST TECHNIQUE: Multiplanar multisequence MR imaging of the abdomen and pelvis was performed both before and after the administration of intravenous contrast. CONTRAST:  12 cc MultiHance. COMPARISON:  CT scan 01/24/2016 FINDINGS: COMBINED FINDINGS FOR BOTH MR ABDOMEN AND PELVIS Examination is quite limited due to respiratory motion and patient motion. Lower chest: The lung bases are grossly clear. No pleural or pericardial effusion. Hepatobiliary: No focal hepatic lesions or intrahepatic biliary dilatation. The  gallbladder is mildly distended. No findings for acute cholecystitis. No definite gallstones. No common bile duct dilatation. Pancreas: No mass, inflammation ductal dilatation. Mild atrophy of the pancreatic tail. Spleen:  Normal size.  No focal lesions. Adrenals/Urinary Tract: Small bilateral adrenal gland nodules demonstrate loss of signal intensity on the out of phase T1 weighted gradient echo sequences most consistent with benign adenomas. The kidneys are unremarkable. Stomach/Bowel: The stomach, duodenum, small bowel and colon are grossly normal. The appendix is not well visualized due to motion artifact. Vascular/Lymphatic: No mesenteric or retroperitoneal mass or abnormal liver aorta is normal in caliber advanced atherosclerotic calcifications. Reproductive: The uterus is grossly normal. There is a simple appearing cyst associated with the right ovary measuring 2.4 cm. Serpiginous fluid collections near the left ovary likely hydrosalpinx. Other: Moderate distention of the bladder is noted. No pelvic mass or adenopathy. Presacral edema is no minimal fluid in the cul-de-sac. Musculoskeletal:  Advanced degenerative changes involving the spine. IMPRESSION: 1. Very limited examination due to patient motion. 2. Small adrenal gland nodules are likely benign adenomas. 3. Suspect mild right lower quadrant inflammatory process but the appendix cystic well visualized. 4. Right ovarian cyst and suspect left-sided hydrosalpinx. 5. Bladder distention. 6. Grossly normal appearance of the pancreaticobiliary tree for age. Electronically Signed   By: Rudie Meyer M.D.   On: 01/27/2016 13:53   Mr Abdomen W Wo Contrast  01/27/2016  CLINICAL DATA:  Possible perforated appendicitis. Evaluate prominent pancreatic duct and right adrenal gland nodule and ovarian cysts. EXAM: MRI ABDOMEN AND PELVIS WITHOUT AND WITH CONTRAST TECHNIQUE: Multiplanar multisequence MR imaging of the abdomen and pelvis was performed both before and after  the administration of intravenous contrast. CONTRAST:  12 cc MultiHance. COMPARISON:  CT scan 01/24/2016 FINDINGS: COMBINED FINDINGS FOR BOTH MR ABDOMEN AND PELVIS Examination is quite limited due to respiratory motion and patient motion. Lower chest: The lung bases are grossly clear. No pleural or pericardial effusion. Hepatobiliary: No focal hepatic lesions or intrahepatic biliary dilatation. The gallbladder is mildly distended. No findings for acute cholecystitis. No definite gallstones. No common bile duct dilatation. Pancreas: No mass, inflammation ductal dilatation. Mild atrophy of the pancreatic tail. Spleen:  Normal size.  No focal lesions. Adrenals/Urinary Tract: Small bilateral adrenal gland nodules demonstrate loss of signal intensity on the out of phase T1 weighted gradient echo sequences most consistent with benign adenomas. The kidneys are unremarkable. Stomach/Bowel: The stomach, duodenum, small bowel and colon are grossly normal. The appendix is not well visualized due to motion artifact. Vascular/Lymphatic: No mesenteric or retroperitoneal mass or abnormal liver aorta is normal in caliber advanced atherosclerotic calcifications. Reproductive: The uterus is grossly normal. There is a simple appearing cyst associated with the right ovary measuring 2.4 cm. Serpiginous fluid collections near the left ovary likely hydrosalpinx. Other: Moderate distention of the bladder is noted. No pelvic mass or adenopathy. Presacral edema is no minimal fluid in the cul-de-sac. Musculoskeletal:  Advanced degenerative changes involving the spine. IMPRESSION: 1. Very limited examination due to  patient motion. 2. Small adrenal gland nodules are likely benign adenomas. 3. Suspect mild right lower quadrant inflammatory process but the appendix cystic well visualized. 4. Right ovarian cyst and suspect left-sided hydrosalpinx. 5. Bladder distention. 6. Grossly normal appearance of the pancreaticobiliary tree for age.  Electronically Signed   By: Rudie MeyerP.  Gallerani M.D.   On: 01/27/2016 13:53   Ct Abdomen Pelvis W Contrast  01/24/2016  CLINICAL DATA:  Left lower quadrant abdominal pain. EXAM: CT ABDOMEN AND PELVIS WITH CONTRAST TECHNIQUE: Multidetector CT imaging of the abdomen and pelvis was performed using the standard protocol following bolus administration of intravenous contrast. CONTRAST:  100mL ISOVUE-300 IOPAMIDOL (ISOVUE-300) INJECTION 61% COMPARISON:  None. FINDINGS: Normal normal lung bases. The appendix is abnormal in appearance at its distal tip. The proximal 90% of the appendix enhances normally. However, there is decreased enhancement at the distal tip with some adjacent stranding and discontinuity of mucosal enhancement. The distal appendix is also mildly dilated measuring between 8 or 9 mm. There are a few dots of free air adjacent to the distal tip is well. No other free air identified. There is a small amount of free fluid in the pelvis on the right. Cholelithiasis is identified. The liver, portal veins, and spleen are normal. The left adrenal gland is normal. There is an enhancing nodule in the right adrenal gland measuring 12 mm. Mild prominence of the pancreatic duct with no obstructing mass or cause identified. The common bile duct is normal in caliber. The pancreas is otherwise normal. A few small renal cysts are seen on the left. A few are too small to characterize. No suspicious renal masses. No ureterectasis or ureteral stones. There are regions of decreased cortical enhancement on the left with no adjacent stranding. This becomes more prominent on delayed images. No filling defects in the upper renal collecting systems. The stomach is normal. There are multiple prominent mildly dilated loops of small bowel with intervening loops of normal caliber small bowel. Given the findings in the right lower quadrant, this is favored to represent developing ileus. The colon is unremarkable. There is a non aneurysmal  atherosclerotic aorta. No adenopathy. No adenopathy or suspicious mass in the pelvis. The uterus and right ovary are normal. There appear to be several cysts in the left ovary with the largest measuring up to 2.5 cm. This is asymmetric to the right. No other abnormalities are seen within the pelvis. Delayed images through the upper kidney demonstrate no filling defects in the renal collecting system. IMPRESSION: 1. The findings are consistent with a perforated appendicitis. The appearance is somewhat unusual with lack of enhancement at the distal tip and discontinuity of the mucosa. While this could represent a typical appendicitis, appendicitis due to a mass at the distal tip is not completely excluded. Recommend correlation with pathology. 2. Striated nephrogram on the left with no adjacent stranding. This is an age-indeterminate finding but pyelonephritis is not excluded given the history of left-sided pain. Recommend correlation with urinalysis and clinical exam. 3. 12 mm enhancing nodule in the right adrenal gland. Recommend MRI for further characterization. 4. Probable developing small-bowel ileus. 5. Multiple cysts in the left ovary with the largest measuring up to 2.5 cm. This is a somewhat unusual appearance in a postmenopausal woman. Recommend correlation with ultrasound for better assessment of cyst character and size. Findings called to Dr. Clydene PughKnott. Electronically Signed   By: Gerome Samavid  Williams III M.D   On: 01/24/2016 14:46   Dg Abd Acute W/chest  01/24/2016  CLINICAL DATA:  RIGHT lower quadrant pain of unspecified duration EXAM: DG ABDOMEN ACUTE W/ 1V CHEST COMPARISON:  Chest radiograph 12/08/2010 FINDINGS: Enlargement of cardiac silhouette. Atherosclerotic calcification aorta. Mediastinal contours and pulmonary vascularity otherwise normal for technique. Suspected RIGHT nipple shadow, with LEFT nipple shadow projecting external to the costal margin. No definite acute infiltrate, pleural effusion or  pneumothorax. Minimal subsegmental atelectasis at LEFT base. Prominent small bowel loops in mid abdomen with lesser degrees of colonic gas are seen scattered throughout the colon. No definite bowel wall thickening or free intraperitoneal air. Bones demineralized with degenerative changes lumbar spine. No urinary tract calcification. IMPRESSION: Enlargement of cardiac silhouette with minimal LEFT basilar atelectasis. Air-filled small bowel loops in the mid abdomen prominent in number and size though some gas is seen throughout the colon ; this could represent mild small bowel ileus but early versus partial small-bowel obstruction not completely excluded. Electronically Signed   By: Ulyses Southward M.D.   On: 01/24/2016 13:04    Assessment & Plan   1. Atrial Fibrillation with RVR - has a history of PAF. On 01/25/2016, she was noted to have gone into atrial fibrillation with RVR, with HR in the 120's - 140's. She was given IV Cardizem  and converted to NSR on 01/26/2016 at 0430. Went back into atrial fibrillation overnight on 01/27/2016. - This patients CHA2DS2-VASc Score and unadjusted Ischemic Stroke Rate (% per year) is equal to 9.7 % stroke rate/year from a score of 6 (CHF, HTN, Female, Age, CVA (2)). Was on Coumadin PTA but had not taken for 20+ days due to running out of the medication and not having transportation to office appointments. Had been on Heparin but this has been stopped due to acute blood loss anemia with a Hbg down to 7.5 . Will restart anticoagulation once ok with GI and surgery.Talked with Pharmacy about compliance with Coumadin and INR appointments. Would prefer NOAC for compliance but financial limitations might restrict this decision. Will ask Case Management to check cost of Xarelto and Eliquis. - Now on short acting Cardizem  q8hours.  Consider  increasing to  q6 hours for better rate control.   Would transition to long-acting Cardizem CD once HR is stable.  2. EKG Changes -  EKG shows new TWI in inferior and lateral leads (more prominent on tracings this admission when in NSR).  - she denies any recent chest pain or anginal equivalents.  - Echo this admission shows a preserved EF of 60-65% with no wall motion abnormalities. - could consider outpatient NST if she developed anginal symptoms.  3. Chronic Diastolic CHF - echo this admission shows an EF of 60-65% with Grade 2 DD. - does not appear volume overloaded on physical exam.  4. HLD - continue statin therapy.   5. Abdominal Pain/ Possible perforated Appendicitis/ C.diff antigen Positive - Abdominal CT on admission showed findings consistent with a perforated appendicitis, a 12 mm enhancing nodule in the right adrenal gland, probable developing small-bowel ileus, and multiple cysts in the left ovary with the largest measuring up to 2.5 cm.  - Surgery was consulted and noted the CT was concerning for a mass in the cecum.  - per admitting team and GI.         Armanda Magic, MD  01/29/2016  1:19 PM

## 2016-01-29 NOTE — Progress Notes (Signed)
[facility] Subjective: Patient examined. Chart reviewed. Patient had rectal bleeding last night when the heparin was discontinued. She denies having any more bleeding after that. It is difficult to communicate with her as she does not speak any [place]. I called her granddaughter Martie Lee @ [phone] but she did not know much about her GI issues. MRI revealed a inflammatory process in the RLQ. CT scan is pending. As per her nurse she has had some "dark stools" today but no frank BRBPR. She received 2 units of PRBC's today. She denies having any abdominal pain, nausea or vomiting.   Objective: Vital signs in last 24 hours: Temp:  [97.9 F (36.6 C)-98.9 F (37.2 C)] 98.9 F (37.2 C) (06/10 0730) Pulse Rate:  [63-128] 64 (06/10 0730) Resp:  [15-28] 16 (06/10 0730) BP: (95-157)/(51-88) 105/57 mmHg (06/10 0730) SpO2:  [98 %-100 %] 99 % (06/10 0730) Last BM Date: 01/29/16  Intake/Output from previous day: 06/09 0701 - 06/10 0700 In: 5881.5 [P.O.:600; I.V.:2696.5; Blood:335; IV Piggyback:2250] Out: 1550 [Urine:1550] Intake/Output this shift: Total I/O In: 250 [I.V.:250] Out: 300 [Urine:300]  General appearance: alert, cooperative, appears stated age, fatigued, no distress and pale Resp: clear to auscultation bilaterally Cardio: regular rate and rhythm, S1, S2 normal, no murmur, click, rub or gallop GI: soft, non-tender; bowel sounds normal; no masses,  no organomegaly  Lab Results:  Recent Labs  01/27/16 0352 01/28/16 0046 01/29/16 0206  WBC 15.1* 15.0*  --   HGB 11.4* 11.9* 7.5*  HCT 34.4* 35.2* 22.2*  PLT 258 303  --    BMET  Recent Labs  01/27/16 0352 01/28/16 0046  NA 134* 134*  K 4.3 4.0  CL 107 107  CO2 18* 15*  GLUCOSE 82 87  BUN 5* 6  CREATININE 0.69 0.76  CALCIUM 8.6* 8.3*   PT/INR  Recent Labs  01/28/16 1148 01/29/16 0206  LABPROT 14.7 16.0*  INR 1.13 1.27   C-Diff  Recent Labs  01/27/16 1100  CDIFFTOX NEGATIVE    Recent Labs   01/27/16 1100  CDIFFPCR NEGATIVE   Studies/Results: Mr Pelvis W Wo Contrast  01/27/2016  CLINICAL DATA:  Possible perforated appendicitis. Evaluate prominent pancreatic duct and right adrenal gland nodule and ovarian cysts. EXAM: MRI ABDOMEN AND PELVIS WITHOUT AND WITH CONTRAST TECHNIQUE: Multiplanar multisequence MR imaging of the abdomen and pelvis was performed both before and after the administration of intravenous contrast. CONTRAST:  12 cc MultiHance. COMPARISON:  CT scan 01/24/2016 FINDINGS: COMBINED FINDINGS FOR BOTH MR ABDOMEN AND PELVIS Examination is quite limited due to respiratory motion and patient motion. Lower chest: The lung bases are grossly clear. No pleural or pericardial effusion. Hepatobiliary: No focal hepatic lesions or intrahepatic biliary dilatation. The gallbladder is mildly distended. No findings for acute cholecystitis. No definite gallstones. No common bile duct dilatation. Pancreas: No mass, inflammation ductal dilatation. Mild atrophy of the pancreatic tail. Spleen:  Normal size.  No focal lesions. Adrenals/Urinary Tract: Small bilateral adrenal gland nodules demonstrate loss of signal intensity on the out of phase T1 weighted gradient echo sequences most consistent with benign adenomas. The kidneys are unremarkable. Stomach/Bowel: The stomach, duodenum, small bowel and colon are grossly normal. The appendix is not well visualized due to motion artifact. Vascular/Lymphatic: No mesenteric or retroperitoneal mass or abnormal liver aorta is normal in caliber advanced atherosclerotic calcifications. Reproductive: The uterus is grossly normal. There is a simple appearing cyst associated with the right ovary measuring 2.4 cm. Serpiginous fluid collections near the left ovary likely  hydrosalpinx. Other: Moderate distention of the bladder is noted. No pelvic mass or adenopathy. Presacral edema is no minimal fluid in the cul-de-sac. Musculoskeletal:  Advanced degenerative changes  involving the spine. IMPRESSION: 1. Very limited examination due to patient motion. 2. Small adrenal gland nodules are likely benign adenomas. 3. Suspect mild right lower quadrant inflammatory process but the appendix cystic well visualized. 4. Right ovarian cyst and suspect left-sided hydrosalpinx. 5. Bladder distention. 6. Grossly normal appearance of the pancreaticobiliary tree for age. Electronically Signed   By: Rudie MeyerP.  Gallerani M.D.   On: 01/27/2016 13:53   Mr Abdomen W Wo Contrast  01/27/2016  CLINICAL DATA:  Possible perforated appendicitis. Evaluate prominent pancreatic duct and right adrenal gland nodule and ovarian cysts. EXAM: MRI ABDOMEN AND PELVIS WITHOUT AND WITH CONTRAST TECHNIQUE: Multiplanar multisequence MR imaging of the abdomen and pelvis was performed both before and after the administration of intravenous contrast. CONTRAST:  12 cc MultiHance. COMPARISON:  CT scan 01/24/2016 FINDINGS: COMBINED FINDINGS FOR BOTH MR ABDOMEN AND PELVIS Examination is quite limited due to respiratory motion and patient motion. Lower chest: The lung bases are grossly clear. No pleural or pericardial effusion. Hepatobiliary: No focal hepatic lesions or intrahepatic biliary dilatation. The gallbladder is mildly distended. No findings for acute cholecystitis. No definite gallstones. No common bile duct dilatation. Pancreas: No mass, inflammation ductal dilatation. Mild atrophy of the pancreatic tail. Spleen:  Normal size.  No focal lesions. Adrenals/Urinary Tract: Small bilateral adrenal gland nodules demonstrate loss of signal intensity on the out of phase T1 weighted gradient echo sequences most consistent with benign adenomas. The kidneys are unremarkable. Stomach/Bowel: The stomach, duodenum, small bowel and colon are grossly normal. The appendix is not well visualized due to motion artifact. Vascular/Lymphatic: No mesenteric or retroperitoneal mass or abnormal liver aorta is normal in caliber advanced  atherosclerotic calcifications. Reproductive: The uterus is grossly normal. There is a simple appearing cyst associated with the right ovary measuring 2.4 cm. Serpiginous fluid collections near the left ovary likely hydrosalpinx. Other: Moderate distention of the bladder is noted. No pelvic mass or adenopathy. Presacral edema is no minimal fluid in the cul-de-sac. Musculoskeletal:  Advanced degenerative changes involving the spine. IMPRESSION: 1. Very limited examination due to patient motion. 2. Small adrenal gland nodules are likely benign adenomas. 3. Suspect mild right lower quadrant inflammatory process but the appendix cystic well visualized. 4. Right ovarian cyst and suspect left-sided hydrosalpinx. 5. Bladder distention. 6. Grossly normal appearance of the pancreaticobiliary tree for age. Electronically Signed   By: Rudie MeyerP.  Gallerani M.D.   On: 01/27/2016 13:53   Medications: I have reviewed the patient's current medications.  Assessment/Plan: 1) Rectal bleeding/severe anemia[hemoglobin 7.5 gm/dl]-last night on Heparin-history of PAF-currently stable. Will await the results of the CT scan. If she bleeds again a bleeding scan will be done-inflammatory mass in the RLQ. I would be hesitant to do a colonoscopy under the present circumstances. 2) Small adrenal nodules.  3) Right sided ovarian cyst with hydrosalpinx.  4) PAF/HTN/Hyperlipidemia. 5) COPD. 6) History of CVA.  LOS: 5 days   Adi Doro 01/29/2016, 9:03 AM

## 2016-01-29 NOTE — Progress Notes (Signed)
[facility] started to have coffee ground and blood clot emesis as well as bloody stools.  Pt appears pale and states dizziness.  Heparin gtt stopped.  BP 66/52, pt assisted to bed and NP and Elink contacted.  Repeat SBP 120-130.  Verbal orders given to stop heparin gtt, give 2L bolus, lab draws and protonix gtt given by Dr. Eugenia Pancoastrimble.  Will continue to monitor.

## 2016-01-29 NOTE — Progress Notes (Addendum)
Patient ID: Linda [name], female   DOB: [birth year], 75 y.o.   MRN: [id number]  PROGRESS NOTE    Linda [name]  KWI:[id number] DOB: Dec 04, 1940 DOA: 01/24/2016  PCP: Webb Silversmith, NP   Brief Narrative:  75 y.o. female with HTN, HLD, Afib on Coumadin, Tobacco/COPD, h/o CVA, brought by EMS from urgent care facility due to loss of consciousness, abd pain with cramps, over 10 episodes of loose BM's. She thought she had food poisoning and was taking imodium but her symptoms have not improved.   In ED, CT abdomen and pelvis notable for questionable  perforated appendicitis. Incidental 12 mm enhancing nodule in the right adrenal gland.Probable developing small-bowel ileus. White count was 16.3. Lactic acid 0.9. Troponin 0.07. Zosyn started.  She was seen by GI in consultation as well as surgery. MRI 01/27/2016 showed small adrenal gland nodules likely benign, mild right lower quadrant inflammatory process but appendix with visualized. No plans for surgical intervention at this point. GI recommends CT abdomen to reassess colon prior to colonoscopy.   Assessment & Plan:  Acute abdominal pain / possible cecal mass / ruled out appendicitis  - On admission, CT abdomen and pelvis showed questioanble perforated appendicitis  - MRI 01/27/2016 showed small adrenal gland nodules likely benign, mild right lower quadrant inflammatory process but appendix with visualized - GI now recommends CT abdomen first to reassess colon and then possible colonoscopy - Per surgery, no surgical intervention at this time - On clear liquids  - Continue supportive care with fluids and antiemetics as needed  Sepsis due to possible perforated appendicitis versus left pyelonephritis / Leukocytosis / Diarrhea  - Sepsis criteria met on admission with low grade fever, tachypnea, tachycardia, leukocytosis. No cler source of infection, initially thought appendicitis versus pyelonephritis - Cultures (urine and blood) unremarkable so  far - Continue zosyn - Stool for c.diff had positive antigen - indicated colonization. Only 1 day of  vanco PO administered 6/8 while awaiting C.diff results   Metabolic acidosis - Likely due to diarrhea. Diarrhea improving - CO2 15 - Follow up BMP in am  Syncope, chronic diastolic CHF - ECHO notable for stable EF 55 % with grade II diastolic CHF - No sign of volume overload on physical exam   Atrial Fibrillation CHA2DS2-VASc score 5 - Due to non compliance  - Has had atrial fibrillation with rapid ventricular rate on 66/ to 6/7 requiring Cardizem drip on and off, last Cardizem drip stopped at 7 pm 6/9. Currently on Cardizem 60 mg Q 8 hours  - Was on heparin drip but this too stopped now due to drop in hemoglobin from 11.9 to 7.5 - Appreciate cardiology following  Acute blood loss anemia - Due to heparin - As indicated above, heparin drip stopped this am due to drop in hgb from 11.9 to 7.5 - On protonix 40 mg IV Q 12 hours   Hypokalemia  - Due to GI losses - Supplemented and within normal limits - Magnesium WNL  Essential hypertension - Continue lisinopril 10 mg daily, Cardizem 60 mg Q 8 hours   Hyperlipidemia - Continue atorvastatin 20 mg daily   Adrenal mass, ovarian cyst  -Incidental 12 mm enhancing nodule in the right adrenal gland seen on CT - Based on MRI, likely benign nodules  Tobacco Abuse - Cessation counseling provided   DVT prophylaxis: use SCD's bilaterally from today due to risk of bleeding  Code Status: Full  Family Communication: Family not at the bedside this am Disposition Plan:  not stable for discharge at this point   Consultants:   Surgery   GI  Cardiology   Procedures:   2 D ECHO 01/25/2016 - ejection fraction 60% with grade 2 diastolic dysfunction  Antimicrobials:  Zosyn 6/5 -->  Vanco PO 01/27/2016 --> 01/28/2016    Subjective: Stable respiratory status.  Objective: Filed Vitals:   01/29/16 0600 01/29/16 0643 01/29/16 0730  01/29/16 0905  BP: 118/56 112/59 105/57 110/56  Pulse: 70 67 64 63  Temp: 98.8 F (37.1 C) 98.7 F (37.1 C) 98.9 F (37.2 C) 98.1 F (36.7 C)  TempSrc: Oral Oral Axillary Oral  Resp: _0 Height:      Weight:      SpO2: 98% 99% 99% 99%    Intake/Output Summary (Last 24 hours) at 01/29/16 1047 Last data filed at 01/29/16 0900  Gross per 24 hour  Intake 6458.4 ml  Output   1650 ml  Net 4808.4 ml   Filed Weights   01/24/16 1956  Weight: 55.7 kg (122 lb 12.7 oz)    Examination:  General exam: no distress   Respiratory system: good respiratory effort, no wheezing  Cardiovascular system: S1 & S2 (+), rate controlled  Gastrointestinal system: non tender, (+) BS, no distention  Central nervous system: No focal deficits, oriented to time, place and person  Extremities: No edema, palpable pulses Skin: warm, dry Psychiatry: Normal mood, no agitation.   Data Reviewed: I have personally reviewed following labs and imaging studies  CBC:  Recent Labs Lab 01/24/16 1135 01/25/16 0413 01/26/16 0422 01/27/16 0352 01/28/16 0046 01/29/16 0206  WBC 16.3* 15.3* 17.6* 15.1* 15.0*  --   NEUTROABS 13.1*  --   --   --   --   --   HGB 13.0 11.1* 12.1 11.4* 11.9* 7.5*  HCT 38.7 34.0* 36.7 34.4* 35.2* 22.2*  MCV 87.6 87.9 87.6 85.8 84.4  --   PLT 232 210 260 258 303  --    Basic Metabolic Panel:  Recent Labs Lab 01/25/16 0413 01/25/16 0724 01/25/16 1322 01/26/16 0422 01/27/16 0352 01/28/16 0046  NA 140 138  --  134* 134* 134*  K 2.9* 3.0*  --  5.1 4.3 4.0  CL 108 107  --  107 107 107  CO2 21* 20*  --  17* 18* 15*  GLUCOSE 98 90  --  176* 82 87  BUN 8 8  --  <5* 5* 6  CREATININE 0.84 0.84  --  0.56 0.69 0.76  CALCIUM 8.1* 7.9*  --  8.4* 8.6* 8.3*  MG  --   --  1.8 1.7 1.9  --    GFR: Estimated Creatinine Clearance: 48.3 mL/min (by C-G formula based on Cr of 0.76). Liver Function Tests:  Recent Labs Lab 01/24/16 1135 01/25/16 0413 01/26/16 0422  AST _1 ALT _2 ALKPHOS 67 65 72  BILITOT 1.4* 1.8* 1.2  PROT 6.4* 6.1* 6.3*  ALBUMIN 3.4* 2.9* 2.7*    Recent Labs Lab 01/24/16 1135  LIPASE 20   No results for input(s): AMMONIA in the last 168 hours. Coagulation Profile:  Recent Labs Lab 01/24/16 1549 01/25/16 0413 01/28/16 1148 01/29/16 0206  INR 1.39 1.22 1.13 1.27   Cardiac Enzymes: No results for input(s): CKTOTAL, CKMB, CKMBINDEX, TROPONINI in the last 168 hours. BNP (last 3 results) No results for input(s): PROBNP in the last 8760 hours. HbA1C: No results for input(s): HGBA1C in the last 72 hours.  CBG:  Recent Labs Lab 01/24/16 1136 01/29/16 0158  GLUCAP 133* 230*   Lipid Profile: No results for input(s): CHOL, HDL, LDLCALC, TRIG, CHOLHDL, LDLDIRECT in the last 72 hours. Thyroid Function Tests: No results for input(s): TSH, T4TOTAL, FREET4, T3FREE, THYROIDAB in the last 72 hours. Anemia Panel: No results for input(s): VITAMINB12, FOLATE, FERRITIN, TIBC, IRON, RETICCTPCT in the last 72 hours. Urine analysis:    Component Value Date/Time   COLORURINE AMBER* 01/24/2016 1940   APPEARANCEUR CLEAR 01/24/2016 1940   LABSPEC 1.020 01/24/2016 1940   PHURINE 5.5 01/24/2016 1940   GLUCOSEU NEGATIVE 01/24/2016 1940   HGBUR NEGATIVE 01/24/2016 1940   BILIRUBINUR SMALL* 01/24/2016 1940   KETONESUR 15* 01/24/2016 1940   PROTEINUR 30* 01/24/2016 1940   NITRITE NEGATIVE 01/24/2016 1940   LEUKOCYTESUR NEGATIVE 01/24/2016 1940   Sepsis Labs: _0 (procalcitonin:4,lacticidven:4)   Culture, blood (routine x 2)     Status: None (Preliminary result)   Collection Time: 01/24/16  2:54 PM  Result Value Ref Range Status   Specimen Description BLOOD RIGHT ANTECUBITAL  Final   Special Requests BOTTLES DRAWN AEROBIC AND ANAEROBIC 10CC  Final   Culture NO GROWTH 1 DAY  Final   Report Status PENDING  Incomplete  Culture, blood (routine x 2)     Status: None (Preliminary result)   Collection Time: 01/24/16  3:00  PM  Result Value Ref Range Status   Specimen Description BLOOD RIGHT HAND  Final   Special Requests BOTTLES DRAWN AEROBIC ONLY 10CC  Final   Culture NO GROWTH 1 DAY  Final   Report Status PENDING  Incomplete  MRSA PCR Screening     Status: None   Collection Time: 01/24/16  8:00 PM  Result Value Ref Range Status   MRSA by PCR NEGATIVE NEGATIVE Final  C difficile quick scan w PCR reflex     Status: Abnormal   Collection Time: 01/25/16  8:50 PM  Result Value Ref Range Status   C Diff antigen POSITIVE (A) NEGATIVE Final   C Diff toxin NEGATIVE NEGATIVE Final   C Diff interpretation   Final    C. difficile present, but toxin not detected. This indicates colonization. In most cases, this does not require treatment. If patient has signs and symptoms consistent with colitis, consider treatment. Requires ENTERIC precautions.      Radiology Studies: Mr Pelvis W Wo Contrast 11-Feb-2016  1. Very limited examination due to patient motion. 2. Small adrenal gland nodules are likely benign adenomas. 3. Suspect mild right lower quadrant inflammatory process but the appendix cystic well visualized. 4. Right ovarian cyst and suspect left-sided hydrosalpinx. 5. Bladder distention. 6. Grossly normal appearance of the pancreaticobiliary tree for age. Electronically Signed   By: Marijo Sanes M.D.   On: Feb 11, 2016 13:53   Mr Abdomen W Wo Contrast 02/11/16  1. Very limited examination due to patient motion. 2. Small adrenal gland nodules are likely benign adenomas. 3. Suspect mild right lower quadrant inflammatory process but the appendix cystic well visualized. 4. Right ovarian cyst and suspect left-sided hydrosalpinx. 5. Bladder distention. 6. Grossly normal appearance of the pancreaticobiliary tree for age. Electronically Signed   By: Marijo Sanes M.D.   On: 11-Feb-2016 13:53   X-ray Chest Pa And Lateral 01/25/2016  Minimal left base atelectasis. No edema or consolidation. Heart borderline prominent but stable. No  new opacity. Electronically Signed   By: Lowella Grip III M.D.   On: 01/25/2016 07:31   Ct Abdomen Pelvis W Contrast 01/24/2016  1. The findings are consistent with a perforated appendicitis. The appearance is somewhat unusual with lack of enhancement at the distal tip and discontinuity of the mucosa. While this could represent a typical appendicitis, appendicitis due to a mass at the distal tip is not completely excluded. Recommend correlation with pathology. 2. Striated nephrogram on the left with no adjacent stranding. This is an age-indeterminate finding but pyelonephritis is not excluded given the history of left-sided pain. Recommend correlation with urinalysis and clinical exam. 3. 12 mm enhancing nodule in the right adrenal gland. Recommend MRI for further characterization. 4. Probable developing small-bowel ileus. 5. Multiple cysts in the left ovary with the largest measuring up to 2.5 cm. This is a somewhat unusual appearance in a postmenopausal woman. Recommend correlation with ultrasound for better assessment of cyst character and size. Findings called to Dr. Laneta Simmers. Electronically Signed   By: Dorise Bullion III M.D   On: 01/24/2016 14:46   Dg Abd Acute W/chest              01/24/2016  Enlargement of cardiac silhouette with minimal LEFT basilar atelectasis. Air-filled small bowel loops in the mid abdomen prominent in number and size though some gas is seen throughout the colon ; this could represent mild small bowel ileus but early versus partial small-bowel obstruction not completely excluded. Electronically Signed   By: Lavonia Dana M.D.   On: 01/24/2016 13:04      Scheduled Meds: . atorvastatin  20 mg Oral Daily  . diltiazem  60 mg Oral Q8H  . lisinopril  10 mg Oral Daily  . nicotine  14 mg Transdermal Daily  . [START ON 02/01/2016] pantoprazole  40 mg Intravenous Q12H  . piperacillin-tazobactam (ZOSYN)  IV  3.375 g Intravenous Q8H  . sodium chloride flush  3 mL Intravenous Q12H  .  Warfarin - Pharmacist Dosing Inpatient   Does not apply q1800   Continuous Infusions: . sodium chloride 100 mL/hr at 01/28/16 2155  . diltiazem (CARDIZEM) infusion Stopped (01/28/16 1915)  . pantoprozole (PROTONIX) infusion 8 mg/hr (01/29/16 0218)     LOS: 5 days    Time spent: 25 minutes Greater than 50% of the time spent on counseling and coordinating the care.   Leisa Lenz, MD [facility] Pager [phone]  If 7PM-7AM, please contact night-coverage [email or web address] Password [facility] 01/29/2016, 10:47 AM

## 2016-01-29 NOTE — Progress Notes (Signed)
Patient ID: Linda [name], female   DOB: 04-29-1941, 75 y.o.   MRN: [id number]     [address] [place], [place] [address]    Phone: [phone] FAX: [phone]     Subjective: Spoke via vietnamese telephone interpreter.  hgb down.  No bloody BMs, last diarrhea was yesterday. Denies any pain.  No n/v.  Appetite is poor.   Objective:  Vital signs:  Filed Vitals:   01/29/16 0600 01/29/16 0643 01/29/16 0730 01/29/16 0905  BP: 118/56 112/59 105/57 110/56  Pulse: 70 67 64 63  Temp: 98.8 F (37.1 C) 98.7 F (37.1 C) 98.9 F (37.2 C) 98.1 F (36.7 C)  TempSrc: Oral Oral Axillary Oral  Resp: _0 Height:      Weight:      SpO2: 98% 99% 99% 99%    Last BM Date: 01/29/16  Intake/Output   Yesterday:  06/09 0701 - 06/10 0700 In: 5881.5 [P.O.:600; I.V.:2696.5; Blood:335; IV Piggyback:2250] Out: 1324 [Urine:1550] This shift:  Total I/O In: 401 [I.V.:250; Blood:335] Out: 300 [Urine:300]  Physical Exam: General: Pt awake/alert/oriented x4 in no acute distress  Abdomen: Soft. Nondistended. Non tender. No evidence of peritonitis. No incarcerated hernias.   Problem List:   Active Problems:   Hypertension   Hyperlipidemia   Smoker   Appendicitis   Syncope   Generalized abdominal pain   Abnormal computed tomography of cecum and terminal ileum   Diarrhea of presumed infectious origin   Lower abdominal pain   PAF (paroxysmal atrial fibrillation) (HCC)    Results:   Labs: Results for orders placed or performed during the hospital encounter of 01/24/16 (from the past 48 hour(s))  C difficile quick scan w PCR reflex     Status: Abnormal   Collection Time: 01/27/16 11:00 AM  Result Value Ref Range   C Diff antigen POSITIVE (A) NEGATIVE   C Diff toxin NEGATIVE NEGATIVE   C Diff interpretation      C. difficile present, but toxin not detected. This indicates colonization. In most cases, this  does not require treatment. If patient has signs and symptoms consistent with colitis, consider treatment. Requires ENTERIC precautions.  Clostridium Difficile by PCR     Status: None   Collection Time: 01/27/16 11:00 AM  Result Value Ref Range   Toxigenic C Difficile by pcr NEGATIVE NEGATIVE  Heparin level (unfractionated)     Status: Abnormal   Collection Time: 01/27/16  2:53 PM  Result Value Ref Range   Heparin Unfractionated 0.22 (L) 0.30 - 0.70 IU/mL    Comment:        IF HEPARIN RESULTS ARE BELOW EXPECTED VALUES, AND PATIENT DOSAGE HAS BEEN CONFIRMED, SUGGEST FOLLOW UP TESTING OF ANTITHROMBIN III LEVELS.   Heparin level (unfractionated)     Status: None   Collection Time: 01/28/16 12:46 AM  Result Value Ref Range   Heparin Unfractionated 0.30 0.30 - 0.70 IU/mL    Comment:        IF HEPARIN RESULTS ARE BELOW EXPECTED VALUES, AND PATIENT DOSAGE HAS BEEN CONFIRMED, SUGGEST FOLLOW UP TESTING OF ANTITHROMBIN III LEVELS.   Basic metabolic panel     Status: Abnormal   Collection Time: 01/28/16 12:46 AM  Result Value Ref Range   Sodium 134 (L) 135 - 145 mmol/L   Potassium 4.0 3.5 - 5.1 mmol/L   Chloride 107 101 - 111 mmol/L   CO2 15 (L)  22 - 32 mmol/L   Glucose, Bld 87 65 - 99 mg/dL   BUN 6 6 - 20 mg/dL   Creatinine, Ser 0.76 0.44 - 1.00 mg/dL   Calcium 8.3 (L) 8.9 - 10.3 mg/dL   GFR calc non Af Amer >60 >60 mL/min   GFR calc Af Amer >60 >60 mL/min    Comment: (NOTE) The eGFR has been calculated using the CKD EPI equation. This calculation has not been validated in all clinical situations. eGFR's persistently <60 mL/min signify possible Chronic Kidney Disease.    Anion gap 12 5 - 15  CBC     Status: Abnormal   Collection Time: 01/28/16 12:46 AM  Result Value Ref Range   WBC 15.0 (H) 4.0 - 10.5 K/uL   RBC 4.17 3.87 - 5.11 MIL/uL   Hemoglobin 11.9 (L) 12.0 - 15.0 g/dL   HCT 35.2 (L) 36.0 - 46.0 %   MCV 84.4 78.0 - 100.0 fL   MCH 28.5 26.0 - 34.0 pg   MCHC 33.8 30.0  - 36.0 g/dL   RDW 12.6 11.5 - 15.5 %   Platelets 303 150 - 400 K/uL  Heparin level (unfractionated)     Status: None   Collection Time: 01/28/16  9:40 AM  Result Value Ref Range   Heparin Unfractionated 0.52 0.30 - 0.70 IU/mL    Comment:        IF HEPARIN RESULTS ARE BELOW EXPECTED VALUES, AND PATIENT DOSAGE HAS BEEN CONFIRMED, SUGGEST FOLLOW UP TESTING OF ANTITHROMBIN III LEVELS.   Protime-INR     Status: None   Collection Time: 01/28/16 11:48 AM  Result Value Ref Range   Prothrombin Time 14.7 11.6 - 15.2 seconds   INR 1.13 0.00 - 1.49  Heparin level (unfractionated)     Status: None   Collection Time: 01/28/16  4:40 PM  Result Value Ref Range   Heparin Unfractionated 0.34 0.30 - 0.70 IU/mL    Comment:        IF HEPARIN RESULTS ARE BELOW EXPECTED VALUES, AND PATIENT DOSAGE HAS BEEN CONFIRMED, SUGGEST FOLLOW UP TESTING OF ANTITHROMBIN III LEVELS.   Glucose, capillary     Status: Abnormal   Collection Time: 01/29/16  1:58 AM  Result Value Ref Range   Glucose-Capillary 230 (H) 65 - 99 mg/dL   Comment 1 Notify RN   Heparin level (unfractionated)     Status: Abnormal   Collection Time: 01/29/16  2:06 AM  Result Value Ref Range   Heparin Unfractionated 0.28 (L) 0.30 - 0.70 IU/mL    Comment:        IF HEPARIN RESULTS ARE BELOW EXPECTED VALUES, AND PATIENT DOSAGE HAS BEEN CONFIRMED, SUGGEST FOLLOW UP TESTING OF ANTITHROMBIN III LEVELS.   Protime-INR     Status: Abnormal   Collection Time: 01/29/16  2:06 AM  Result Value Ref Range   Prothrombin Time 16.0 (H) 11.6 - 15.2 seconds   INR 1.27 0.00 - 1.49  Hemoglobin and hematocrit, blood     Status: Abnormal   Collection Time: 01/29/16  2:06 AM  Result Value Ref Range   Hemoglobin 7.5 (L) 12.0 - 15.0 g/dL    Comment: REPEATED TO VERIFY   HCT 22.2 (L) 36.0 - 46.0 %  Type and screen [place]     Status: None (Preliminary result)   Collection Time: 01/29/16  2:06 AM  Result Value Ref Range   ABO/RH(D)  B POS    Antibody Screen NEG    Sample Expiration  02/01/2016    Unit Number [id number]    Blood Component Type RED CELLS,LR    Unit division 00    Status of Unit ALLOCATED    Transfusion Status OK TO TRANSFUSE    Crossmatch Result Compatible    Unit Number [id number]    Blood Component Type RED CELLS,LR    Unit division 00    Status of Unit ISSUED    Transfusion Status OK TO TRANSFUSE    Crossmatch Result Compatible    Unit Number [id number]    Blood Component Type RED CELLS,LR    Unit division 00    Status of Unit ISSUED    Transfusion Status OK TO TRANSFUSE    Crossmatch Result Compatible    Unit Number [id number]    Blood Component Type RED CELLS,LR    Unit division 00    Status of Unit ALLOCATED    Transfusion Status OK TO TRANSFUSE    Crossmatch Result Compatible   ABO/Rh     Status: None   Collection Time: 01/29/16  2:06 AM  Result Value Ref Range   ABO/RH(D) B POS   Prepare RBC     Status: None   Collection Time: 01/29/16  2:55 AM  Result Value Ref Range   Order Confirmation ORDER PROCESSED BY BLOOD BANK     Imaging / Studies: Mr Pelvis W Wo Contrast  01/27/2016  CLINICAL DATA:  Possible perforated appendicitis. Evaluate prominent pancreatic duct and right adrenal gland nodule and ovarian cysts. EXAM: MRI ABDOMEN AND PELVIS WITHOUT AND WITH CONTRAST TECHNIQUE: Multiplanar multisequence MR imaging of the abdomen and pelvis was performed both before and after the administration of intravenous contrast. CONTRAST:  12 cc MultiHance. COMPARISON:  CT scan 01/24/2016 FINDINGS: COMBINED FINDINGS FOR BOTH MR ABDOMEN AND PELVIS Examination is quite limited due to respiratory motion and patient motion. Lower chest: The lung bases are grossly clear. No pleural or pericardial effusion. Hepatobiliary: No focal hepatic lesions or intrahepatic biliary dilatation. The gallbladder is mildly distended. No findings for acute cholecystitis. No definite gallstones. No common bile  duct dilatation. Pancreas: No mass, inflammation ductal dilatation. Mild atrophy of the pancreatic tail. Spleen:  Normal size.  No focal lesions. Adrenals/Urinary Tract: Small bilateral adrenal gland nodules demonstrate loss of signal intensity on the out of phase T1 weighted gradient echo sequences most consistent with benign adenomas. The kidneys are unremarkable. Stomach/Bowel: The stomach, duodenum, small bowel and colon are grossly normal. The appendix is not well visualized due to motion artifact. Vascular/Lymphatic: No mesenteric or retroperitoneal mass or abnormal liver aorta is normal in caliber advanced atherosclerotic calcifications. Reproductive: The uterus is grossly normal. There is a simple appearing cyst associated with the right ovary measuring 2.4 cm. Serpiginous fluid collections near the left ovary likely hydrosalpinx. Other: Moderate distention of the bladder is noted. No pelvic mass or adenopathy. Presacral edema is no minimal fluid in the cul-de-sac. Musculoskeletal:  Advanced degenerative changes involving the spine. IMPRESSION: 1. Very limited examination due to patient motion. 2. Small adrenal gland nodules are likely benign adenomas. 3. Suspect mild right lower quadrant inflammatory process but the appendix cystic well visualized. 4. Right ovarian cyst and suspect left-sided hydrosalpinx. 5. Bladder distention. 6. Grossly normal appearance of the pancreaticobiliary tree for age. Electronically Signed   By: Marijo Sanes M.D.   On: 01/27/2016 13:53   Mr Abdomen W Wo Contrast  01/27/2016  CLINICAL DATA:  Possible perforated appendicitis. Evaluate prominent pancreatic duct and right adrenal gland nodule and ovarian  cysts. EXAM: MRI ABDOMEN AND PELVIS WITHOUT AND WITH CONTRAST TECHNIQUE: Multiplanar multisequence MR imaging of the abdomen and pelvis was performed both before and after the administration of intravenous contrast. CONTRAST:  12 cc MultiHance. COMPARISON:  CT scan 01/24/2016  FINDINGS: COMBINED FINDINGS FOR BOTH MR ABDOMEN AND PELVIS Examination is quite limited due to respiratory motion and patient motion. Lower chest: The lung bases are grossly clear. No pleural or pericardial effusion. Hepatobiliary: No focal hepatic lesions or intrahepatic biliary dilatation. The gallbladder is mildly distended. No findings for acute cholecystitis. No definite gallstones. No common bile duct dilatation. Pancreas: No mass, inflammation ductal dilatation. Mild atrophy of the pancreatic tail. Spleen:  Normal size.  No focal lesions. Adrenals/Urinary Tract: Small bilateral adrenal gland nodules demonstrate loss of signal intensity on the out of phase T1 weighted gradient echo sequences most consistent with benign adenomas. The kidneys are unremarkable. Stomach/Bowel: The stomach, duodenum, small bowel and colon are grossly normal. The appendix is not well visualized due to motion artifact. Vascular/Lymphatic: No mesenteric or retroperitoneal mass or abnormal liver aorta is normal in caliber advanced atherosclerotic calcifications. Reproductive: The uterus is grossly normal. There is a simple appearing cyst associated with the right ovary measuring 2.4 cm. Serpiginous fluid collections near the left ovary likely hydrosalpinx. Other: Moderate distention of the bladder is noted. No pelvic mass or adenopathy. Presacral edema is no minimal fluid in the cul-de-sac. Musculoskeletal:  Advanced degenerative changes involving the spine. IMPRESSION: 1. Very limited examination due to patient motion. 2. Small adrenal gland nodules are likely benign adenomas. 3. Suspect mild right lower quadrant inflammatory process but the appendix cystic well visualized. 4. Right ovarian cyst and suspect left-sided hydrosalpinx. 5. Bladder distention. 6. Grossly normal appearance of the pancreaticobiliary tree for age. Electronically Signed   By: Marijo Sanes M.D.   On: 01/27/2016 13:53    Medications / Allergies:  Scheduled  Meds: . atorvastatin  20 mg Oral Daily  . diltiazem  60 mg Oral Q8H  . lisinopril  10 mg Oral Daily  . nicotine  14 mg Transdermal Daily  . [START ON 02/01/2016] pantoprazole  40 mg Intravenous Q12H  . piperacillin-tazobactam (ZOSYN)  IV  3.375 g Intravenous Q8H  . sodium chloride flush  3 mL Intravenous Q12H  . Warfarin - Pharmacist Dosing Inpatient   Does not apply q1800   Continuous Infusions: . sodium chloride 100 mL/hr at 01/28/16 2155  . diltiazem (CARDIZEM) infusion Stopped (01/28/16 1915)  . pantoprozole (PROTONIX) infusion 8 mg/hr (01/29/16 0218)   PRN Meds:.acetaminophen **OR** acetaminophen, bisacodyl, hydrALAZINE, HYDROcodone-acetaminophen, magnesium citrate, morphine injection, ondansetron **OR** ondansetron (ZOFRAN) IV, senna-docusate, traZODone  Antibiotics: Anti-infectives    Start     Dose/Rate Route Frequency Ordered Stop   01/26/16 1900  vancomycin (VANCOCIN) 50 mg/mL oral solution 125 mg  Status:  Discontinued     125 mg Oral Every 6 hours 01/26/16 1842 01/27/16 1822   01/24/16 2230  piperacillin-tazobactam (ZOSYN) IVPB 3.375 g     3.375 g 12.5 mL/hr over 240 Minutes Intravenous Every 8 hours 01/24/16 2200     01/24/16 1500  piperacillin-tazobactam (ZOSYN) IVPB 3.375 g     3.375 g 100 mL/hr over 30 Minutes Intravenous  Once 01/24/16 1451 01/24/16 1538        Assessment/Plan Appendicitis versus cecal mass c diff colitis -CT today.  Recommend a colonoscopy. ABLA-agree with stopping heparin gtt. VTE prophylaxis-heparin gtt for hx AF, stopped     Erby Pian, ANP-BC [facility]  01/29/2016  9:35 AM

## 2016-01-29 NOTE — Progress Notes (Signed)
Pts Hgb 7.5  BP 96/63  NP Lynch on call notified and 2 units blood ordered.  Gastroenterology updated.  Family updated.  Will continue to monitor.

## 2016-01-30 LAB — CBC
HCT: 27.3 % — ABNORMAL LOW (ref 36.0–46.0)
HEMOGLOBIN: 9.3 g/dL — AB (ref 12.0–15.0)
MCH: 28.7 pg (ref 26.0–34.0)
MCHC: 34.1 g/dL (ref 30.0–36.0)
MCV: 84.3 fL (ref 78.0–100.0)
PLATELETS: 226 10*3/uL (ref 150–400)
RBC: 3.24 MIL/uL — AB (ref 3.87–5.11)
RDW: 13.7 % (ref 11.5–15.5)
WBC: 13.9 10*3/uL — AB (ref 4.0–10.5)

## 2016-01-30 LAB — BASIC METABOLIC PANEL
Anion gap: 5 (ref 5–15)
BUN: 6 mg/dL (ref 6–20)
CHLORIDE: 111 mmol/L (ref 101–111)
CO2: 24 mmol/L (ref 22–32)
Calcium: 7.8 mg/dL — ABNORMAL LOW (ref 8.9–10.3)
Creatinine, Ser: 0.57 mg/dL (ref 0.44–1.00)
Glucose, Bld: 103 mg/dL — ABNORMAL HIGH (ref 65–99)
POTASSIUM: 3 mmol/L — AB (ref 3.5–5.1)
SODIUM: 140 mmol/L (ref 135–145)

## 2016-01-30 LAB — PROTIME-INR
INR: 1.09 (ref 0.00–1.49)
PROTHROMBIN TIME: 14.3 s (ref 11.6–15.2)

## 2016-01-30 LAB — MAGNESIUM: MAGNESIUM: 1.9 mg/dL (ref 1.7–2.4)

## 2016-01-30 MED ORDER — DILTIAZEM HCL 60 MG PO TABS
60.0000 mg | ORAL_TABLET | Freq: Three times a day (TID) | ORAL | Status: DC
  Administered 2016-01-30 (×2): 60 mg via ORAL
  Filled 2016-01-30 (×2): qty 1

## 2016-01-30 MED ORDER — DILTIAZEM HCL 60 MG PO TABS: 60.0000 mg | ORAL_TABLET | Freq: Four times a day (QID) | ORAL | Status: DC

## 2016-01-30 MED ORDER — POTASSIUM CHLORIDE 10 MEQ/100ML IV SOLN
10.0000 meq | INTRAVENOUS | Status: AC
  Administered 2016-01-30 (×4): 10 meq via INTRAVENOUS
  Filled 2016-01-30 (×4): qty 100

## 2016-01-30 NOTE — Progress Notes (Signed)
Pharmacy Antibiotic Note  Linda [name] is a 75 y.o. female admitted on 01/24/2016 with intra-abdominal infxn.  Pharmacy has been consulted for zosyn dosing- day #7 of therapy. Now afeb and WBC slowly trending down. SCr is stable. Plan for interval appendectomy per surgery notes. Continuing antibiotics for now.   Plan: - Continue Zosyn 3.375gm IV Q8H (4 hr inf) - F/u renal fxn, C&S, clinical status   Height: 5' (152.4 cm) Weight: 122 lb 12.7 oz (55.7 kg) IBW/kg (Calculated) : 45.5  Temp (24hrs), Avg:98.3 F (36.8 C), Min:97.8 F (36.6 C), Max:98.8 F (37.1 C)   Recent Labs Lab 01/24/16 1149 01/24/16 1514 01/25/16 0413 01/25/16 0724 01/26/16 0422 01/27/16 0352 01/28/16 0046 01/30/16 0433  WBC  --   --  15.3*  --  17.6* 15.1* 15.0* 13.9*  CREATININE  --   --  0.84 0.84 0.56 0.69 0.76 0.57  LATICACIDVEN 1.93 0.97  --   --   --   --   --   --     Estimated Creatinine Clearance: 48.3 mL/min (by C-G formula based on Cr of 0.57).    No Known Allergies  Antimicrobials this admission: 6/5 Zosyn>> 6/7 Vancomycin PO>>6/8  Dose adjustments this admission: N/A  Microbiology results: 6/5 blood x2>> negative 6/5 MRSA PCR negative 6/8 C Diff antigen positive, toxin negative   Link SnufferJessica Gershom Brobeck, PharmD, BCPS Clinical Pharmacist [phone]  01/30/2016 10:00 AM

## 2016-01-30 NOTE — Progress Notes (Signed)
Patient ID: Linda [name], female   DOB: 10/28/1940, 75 y.o.   MRN: [id number]     [facility]      [address] [place], [place] [address]    Phone: [phone] FAX: [phone]     Subjective: Ct reviewed. WBC down.  Appetite significantly improved.  Denies any abdominal pain.  H&h stable. 1u PRBCs 6/10  Objective:  Vital signs:  Filed Vitals:   01/29/16 2253 01/30/16 0310 01/30/16 0509 01/30/16 0745  BP: 144/74 132/59 141/61 154/57  Pulse: 66 61    Temp: 98.1 F (36.7 C) 98.5 F (36.9 C)  98.8 F (37.1 C)  TempSrc: Oral Oral  Axillary  Resp: 21 19  19   Height:      Weight:      SpO2: 99% 98%  99%    Last BM Date: 01/30/16  Intake/Output   Yesterday:  06/10 0701 - 06/11 0700 In: 3880 [P.O.:620; I.V.:2775; Blood:335; IV Piggyback:150] Out: 8832 [Urine:3750] This shift:  Total I/O In: 200 [I.V.:100; IV Piggyback:100] Out: -    Physical Exam: General: Pt awake/alert/oriented x4 in no acute distress  Abdomen: Soft. Nondistended. Non tender. No evidence of peritonitis. No incarcerated hernias.   Problem List:   Active Problems:   Hypertension   Hyperlipidemia   Smoker   Appendicitis   Syncope   Generalized abdominal pain   Diarrhea of presumed infectious origin   Lower abdominal pain   PAF (paroxysmal atrial fibrillation) (HCC)    Results:   Labs: Results for orders placed or performed during the hospital encounter of 01/24/16 (from the past 48 hour(s))  Heparin level (unfractionated)     Status: None   Collection Time: 01/28/16  9:40 AM  Result Value Ref Range   Heparin Unfractionated 0.52 0.30 - 0.70 IU/mL    Comment:        IF HEPARIN RESULTS ARE BELOW EXPECTED VALUES, AND PATIENT DOSAGE HAS BEEN CONFIRMED, SUGGEST FOLLOW UP TESTING OF ANTITHROMBIN III LEVELS.   Protime-INR     Status: None   Collection Time: 01/28/16 11:48 AM  Result Value Ref Range   Prothrombin Time 14.7 11.6 - 15.2  seconds   INR 1.13 0.00 - 1.49  Heparin level (unfractionated)     Status: None   Collection Time: 01/28/16  4:40 PM  Result Value Ref Range   Heparin Unfractionated 0.34 0.30 - 0.70 IU/mL    Comment:        IF HEPARIN RESULTS ARE BELOW EXPECTED VALUES, AND PATIENT DOSAGE HAS BEEN CONFIRMED, SUGGEST FOLLOW UP TESTING OF ANTITHROMBIN III LEVELS.   Glucose, capillary     Status: Abnormal   Collection Time: 01/29/16  1:58 AM  Result Value Ref Range   Glucose-Capillary 230 (H) 65 - 99 mg/dL   Comment 1 Notify RN   Heparin level (unfractionated)     Status: Abnormal   Collection Time: 01/29/16  2:06 AM  Result Value Ref Range   Heparin Unfractionated 0.28 (L) 0.30 - 0.70 IU/mL    Comment:        IF HEPARIN RESULTS ARE BELOW EXPECTED VALUES, AND PATIENT DOSAGE HAS BEEN CONFIRMED, SUGGEST FOLLOW UP TESTING OF ANTITHROMBIN III LEVELS.   Protime-INR     Status: Abnormal   Collection Time: 01/29/16  2:06 AM  Result Value Ref Range   Prothrombin Time 16.0 (H) 11.6 - 15.2 seconds   INR 1.27 0.00 - 1.49  Hemoglobin and hematocrit, blood  Status: Abnormal   Collection Time: 01/29/16  2:06 AM  Result Value Ref Range   Hemoglobin 7.5 (L) 12.0 - 15.0 g/dL    Comment: REPEATED TO VERIFY   HCT 22.2 (L) 36.0 - 46.0 %  Type and screen [place]     Status: None (Preliminary result)   Collection Time: 01/29/16  2:06 AM  Result Value Ref Range   ABO/RH(D) B POS    Antibody Screen NEG    Sample Expiration 02/01/2016    Unit Number [id number]    Blood Component Type RED CELLS,LR    Unit division 00    Status of Unit ALLOCATED    Transfusion Status OK TO TRANSFUSE    Crossmatch Result Compatible    Unit Number [id number]    Blood Component Type RED CELLS,LR    Unit division 00    Status of Unit ISSUED,FINAL    Transfusion Status OK TO TRANSFUSE    Crossmatch Result Compatible    Unit Number [id number]    Blood Component Type RED CELLS,LR    Unit  division 00    Status of Unit ISSUED,FINAL    Transfusion Status OK TO TRANSFUSE    Crossmatch Result Compatible    Unit Number [id number]    Blood Component Type RED CELLS,LR    Unit division 00    Status of Unit ALLOCATED    Transfusion Status OK TO TRANSFUSE    Crossmatch Result Compatible   ABO/Rh     Status: None   Collection Time: 01/29/16  2:06 AM  Result Value Ref Range   ABO/RH(D) B POS   Prepare RBC     Status: None   Collection Time: 01/29/16  2:55 AM  Result Value Ref Range   Order Confirmation ORDER PROCESSED BY BLOOD BANK   Protime-INR     Status: None   Collection Time: 01/30/16  4:33 AM  Result Value Ref Range   Prothrombin Time 14.3 11.6 - 15.2 seconds   INR 1.09 0.00 - 1.49  CBC     Status: Abnormal   Collection Time: 01/30/16  4:33 AM  Result Value Ref Range   WBC 13.9 (H) 4.0 - 10.5 K/uL   RBC 3.24 (L) 3.87 - 5.11 MIL/uL   Hemoglobin 9.3 (L) 12.0 - 15.0 g/dL   HCT 27.3 (L) 36.0 - 46.0 %   MCV 84.3 78.0 - 100.0 fL   MCH 28.7 26.0 - 34.0 pg   MCHC 34.1 30.0 - 36.0 g/dL   RDW 13.7 11.5 - 15.5 %   Platelets 226 150 - 400 K/uL  Basic metabolic panel     Status: Abnormal   Collection Time: 01/30/16  4:33 AM  Result Value Ref Range   Sodium 140 135 - 145 mmol/L   Potassium 3.0 (L) 3.5 - 5.1 mmol/L   Chloride 111 101 - 111 mmol/L   CO2 24 22 - 32 mmol/L   Glucose, Bld 103 (H) 65 - 99 mg/dL   BUN 6 6 - 20 mg/dL   Creatinine, Ser 0.57 0.44 - 1.00 mg/dL   Calcium 7.8 (L) 8.9 - 10.3 mg/dL   GFR calc non Af Amer >60 >60 mL/min   GFR calc Af Amer >60 >60 mL/min    Comment: (NOTE) The eGFR has been calculated using the CKD EPI equation. This calculation has not been validated in all clinical situations. eGFR's persistently <60 mL/min signify possible Chronic Kidney Disease.    Anion gap 5 5 - 15  Magnesium     Status: None   Collection Time: 01/30/16  4:33 AM  Result Value Ref Range   Magnesium 1.9 1.7 - 2.4 mg/dL    Imaging / Studies: Ct Abdomen  Pelvis W Contrast  01/29/2016  CLINICAL DATA:  Vomiting blood, bloody stools, diarrhea, perforated appendicitis EXAM: CT ABDOMEN AND PELVIS WITH CONTRAST TECHNIQUE: Multidetector CT imaging of the abdomen and pelvis was performed using the standard protocol following bolus administration of intravenous contrast. CONTRAST:  172m ISOVUE-300 IOPAMIDOL (ISOVUE-300) INJECTION 61% COMPARISON:  01/24/2016 FINDINGS: Lung bases are unremarkable. Enhanced liver is unremarkable. Small calcified gallstones within gallbladder again noted the largest measures 4 mm. Enhanced pancreas is unremarkable. Enhanced spleen is unremarkable. There is no evidence of small bowel obstruction. Rectal contrast material was given to the patient prior to scanning. Contrast material noted in right colon and cecum. There is some reflux contrast material within terminal ileum. No extravasation of contrast material is noted. Again noted abnormal mild thickening and enhancement of the tip of the appendix measures at least 8 mm in axial image 71. There is irregularity of anterior wall of the appendix and there is a enhancing fluid collection just anterior to tip of the appendix surrounded by small bowel loops. This collection is best seen in axial images 69 measures 1.5 cm. Again findings are consistent with appendiceal perforation and small periappendiceal abscess. Mild thickening of small bowel wall surrounding this collection probable mild satellite inflammation. Again there is no evidence of extravasation of rectal contrast material. Enhanced kidneys are symmetrical in size. Again noted mild striated nephrogram in midpole lateral aspect and lower pole of the left kidney. Mild focal pyelonephritis cannot be excluded. Delayed renal images shows bilateral renal symmetrical excretion. Bilateral visualized proximal ureter is unremarkable. The uterus is unremarkable. There is a right ovarian cyst measures 2.6 cm. IMPRESSION: 1. Again noted abnormal  mild thickening and enhancement of the tip of the appendix measures at least 8 mm in axial image 71. There is irregularity of anterior wall of the appendix and there is a enhancing fluid collection just anterior to tip of the appendix surrounded by small bowel loops. This collection is best seen in axial images 69 measures 1.5 cm. Again findings are consistent with appendiceal perforation and small periappendiceal abscess. Mild thickening of small bowel wall surrounding this collection probable mild satellite inflammation. Again there is no evidence of extravasation of rectal contrast material. 2. No small bowel obstruction. 3. No hydronephrosis or hydroureter. 4. Again noted focal striated nephrogram mid and lower pole of the left kidney suspicious for mild focal pyelonephritis. Bilateral renal symmetrical excretion. 5. Atherosclerotic calcifications of abdominal aorta and iliac arteries. 6. There is a right ovarian cyst measures 2.6 cm. Electronically Signed   By: LLahoma CrockerM.D.   On: 01/29/2016 16:41    Medications / Allergies:  Scheduled Meds: . atorvastatin  20 mg Oral Daily  . diltiazem  60 mg Oral Q8H  . lisinopril  10 mg Oral Daily  . nicotine  14 mg Transdermal Daily  . [START ON 02/01/2016] pantoprazole  40 mg Intravenous Q12H  . piperacillin-tazobactam (ZOSYN)  IV  3.375 g Intravenous Q8H  . potassium chloride  10 mEq Intravenous Q1 Hr x 4  . sodium chloride flush  3 mL Intravenous Q12H   Continuous Infusions: . sodium chloride 100 mL/hr at 01/28/16 2155  . diltiazem (CARDIZEM) infusion Stopped (01/28/16 1915)  . pantoprozole (PROTONIX) infusion 8 mg/hr (01/29/16 2348)   PRN Meds:.acetaminophen **OR**  acetaminophen, bisacodyl, hydrALAZINE, HYDROcodone-acetaminophen, magnesium citrate, morphine injection, ondansetron **OR** ondansetron (ZOFRAN) IV, senna-docusate, traZODone  Antibiotics: Anti-infectives    Start     Dose/Rate Route Frequency Ordered Stop   01/26/16 1900  vancomycin  (VANCOCIN) 50 mg/mL oral solution 125 mg  Status:  Discontinued     125 mg Oral Every 6 hours 01/26/16 1842 01/27/16 1822   01/24/16 2230  piperacillin-tazobactam (ZOSYN) IVPB 3.375 g     3.375 g 12.5 mL/hr over 240 Minutes Intravenous Every 8 hours 01/24/16 2200     01/24/16 1500  piperacillin-tazobactam (ZOSYN) IVPB 3.375 g     3.375 g 100 mL/hr over 30 Minutes Intravenous  Once 01/24/16 1451 01/24/16 1538        Assessment/Plan Perforated appendicitis-continue with antibiotics, plan for interval appendectomy.  OP f/u with Dr. Brantley Stage.  Spoke with the patient via telephone interpreter ID-[email or web address] FEN-appetite improved  ABLA-1u PRBCs 6/10, stable  VTE prophylaxis-heparin gtt for hx AF, stopped, will discuss with MD when okay to resume coumadin from a surgical standpoint   Erby Pian, ANP-BC [facility]   01/30/2016 8:51 AM

## 2016-01-30 NOTE — Progress Notes (Signed)
Cross cover [facility] Subjective: Since I last evaluated the patient, she seems to be doing really well. She denies having any abdominal pain nausea vomiting diarrhea constipation She's had 2 soft bowel movements today without any blood or mucus n the stool. Objective: Vital signs in last 24 hours: Temp:  [97.8 F (36.6 C)-98.8 F (37.1 C)] 98.8 F (37.1 C) (06/11 0745) Pulse Rate:  [61-66] 61 (06/11 0310) Resp:  [19-22] 19 (06/11 0745) BP: (95-154)/(53-74) 154/57 mmHg (06/11 0745) SpO2:  [98 %-100 %] 99 % (06/11 0745) Last BM Date: 01/30/16  Intake/Output from previous day: 06/10 0701 - 06/11 0700 In: 3880 [P.O.:620; I.V.:2775; Blood:335; IV Piggyback:150] Out: 3750 [Urine:3750] Intake/Output this shift: Total I/O In: 200 [I.V.:100; IV Piggyback:100] Out: -   General appearance: alert, cooperative, appears stated age, fatigued, no distress and pale Resp: clear to auscultation bilaterally Cardio: regular rate and rhythm, S1, S2 normal, no murmur, click, rub or gallop GI: soft, non-tender; bowel sounds normal; no masses,  no organomegaly Extremities: extremities normal, atraumatic, no cyanosis or edema  Lab Results:  Recent Labs  01/28/16 0046 01/29/16 0206 01/30/16 0433  WBC 15.0*  --  13.9*  HGB 11.9* 7.5* 9.3*  HCT 35.2* 22.2* 27.3*  PLT 303  --  226   BMET  Recent Labs  01/28/16 0046 01/30/16 0433  NA 134* 140  K 4.0 3.0*  CL 107 111  CO2 15* 24  GLUCOSE 87 103*  BUN 6 6  CREATININE 0.76 0.57  CALCIUM 8.3* 7.8*   LFT No results for input(s): PROT, ALBUMIN, AST, ALT, ALKPHOS, BILITOT, BILIDIR, IBILI in the last 72 hours. PT/INR  Recent Labs  01/29/16 0206 01/30/16 0433  LABPROT 16.0* 14.3  INR 1.27 1.09   C-Diff  Recent Labs  01/27/16 1100  CDIFFTOX NEGATIVE    Recent Labs  01/27/16 1100  CDIFFPCR NEGATIVE   Studies/Results: Ct Abdomen Pelvis W Contrast  01/29/2016  CLINICAL DATA:  Vomiting blood, bloody stools, diarrhea, perforated  appendicitis EXAM: CT ABDOMEN AND PELVIS WITH CONTRAST TECHNIQUE: Multidetector CT imaging of the abdomen and pelvis was performed using the standard protocol following bolus administration of intravenous contrast. CONTRAST:  100mL ISOVUE-300 IOPAMIDOL (ISOVUE-300) INJECTION 61% COMPARISON:  01/24/2016 FINDINGS: Lung bases are unremarkable. Enhanced liver is unremarkable. Small calcified gallstones within gallbladder again noted the largest measures 4 mm. Enhanced pancreas is unremarkable. Enhanced spleen is unremarkable. There is no evidence of small bowel obstruction. Rectal contrast material was given to the patient prior to scanning. Contrast material noted in right colon and cecum. There is some reflux contrast material within terminal ileum. No extravasation of contrast material is noted. Again noted abnormal mild thickening and enhancement of the tip of the appendix measures at least 8 mm in axial image 71. There is irregularity of anterior wall of the appendix and there is a enhancing fluid collection just anterior to tip of the appendix surrounded by small bowel loops. This collection is best seen in axial images 69 measures 1.5 cm. Again findings are consistent with appendiceal perforation and small periappendiceal abscess. Mild thickening of small bowel wall surrounding this collection probable mild satellite inflammation. Again there is no evidence of extravasation of rectal contrast material. Enhanced kidneys are symmetrical in size. Again noted mild striated nephrogram in midpole lateral aspect and lower pole of the left kidney. Mild focal pyelonephritis cannot be excluded. Delayed renal images shows bilateral renal symmetrical excretion. Bilateral visualized proximal ureter is unremarkable. The uterus is unremarkable. There is a right  ovarian cyst measures 2.6 cm. IMPRESSION: 1. Again noted abnormal mild thickening and enhancement of the tip of the appendix measures at least 8 mm in axial image 71.  There is irregularity of anterior wall of the appendix and there is a enhancing fluid collection just anterior to tip of the appendix surrounded by small bowel loops. This collection is best seen in axial images 69 measures 1.5 cm. Again findings are consistent with appendiceal perforation and small periappendiceal abscess. Mild thickening of small bowel wall surrounding this collection probable mild satellite inflammation. Again there is no evidence of extravasation of rectal contrast material. 2. No small bowel obstruction. 3. No hydronephrosis or hydroureter. 4. Again noted focal striated nephrogram mid and lower pole of the left kidney suspicious for mild focal pyelonephritis. Bilateral renal symmetrical excretion. 5. Atherosclerotic calcifications of abdominal aorta and iliac arteries. 6. There is a right ovarian cyst measures 2.6 cm. Electronically Signed   By: Natasha Mead M.D.   On: 01/29/2016 16:41   Medications: I have reviewed the patient's current medications.  Assessment/Plan: Anemia, s/p GI bleed with an abnormal CT scan showing thickening in the area of the appendix. Initially patient was treated as an appendicitis as she had elevated WBC count and fever with leukocytosis, responded well to antibiotics over the weekend she had a GI bleed requiring 2 units of packed red blood cells. A colonoscopy is planned but I will let Dr. Adela Lank  decide on the timing of the procedure once he assumes care tomorrow.  LOS: 6 days   Linda [name] 01/30/2016, 9:21 AM

## 2016-01-30 NOTE — Progress Notes (Signed)
Patient ID: Linda [name], female   DOB: 06-21-1941, 75 y.o.   MRN: [id number]  PROGRESS NOTE    SANJANA FOLZ  TVA:[id number] DOB: 07-22-1941 DOA: 01/24/2016  PCP: Nicki Reaper, NP   Brief Narrative:  75 y.o. female with HTN, HLD, Afib on Coumadin, Tobacco/COPD, h/o CVA, brought by EMS from urgent care facility due to loss of consciousness, abd pain with cramps, over 10 episodes of loose BM's. She thought she had food poisoning and was taking imodium but her symptoms have not improved.   In ED, CT abdomen and pelvis notable for questionable  perforated appendicitis. Incidental 12 mm enhancing nodule in the right adrenal gland.Probable developing small-bowel ileus. White count was 16.3. Lactic acid 0.9. Troponin 0.07. Zosyn started.  She was seen by GI in consultation as well as surgery. MRI 01/27/2016 showed small adrenal gland nodules likely benign, mild right lower quadrant inflammatory process but appendix with visualized. No plans for surgical intervention at this point. GI recommends CT abdomen to reassess colon prior to colonoscopy.   Assessment & Plan:  Acute abdominal pain / appendiceal perforation   - On admission, CT abdomen and pelvis showed questioanble perforated appendicitis  - MRI 01/27/2016 showed small adrenal gland nodules likely benign, mild right lower quadrant inflammatory process but appendix visualized - Repeat CT abdomen 6/10 showed again noted abnormal mild thickening and enhancement of the tip of the appendix measures at least 8 mm, irregularity of anterior wall of the appendix and enhancing fluid collection anterior to tip of the appendix surrounded by small bowel loops, findings are consistent with appendiceal perforation and small periappendiceal abscess.  - Will follow up on surgery recommendations - GI also following  - On clear liquids  - Continue supportive care with fluids  Sepsis due to possible perforated appendicitis versus left pyelonephritis /  Leukocytosis / Diarrhea  - Sepsis criteria met on admission with low grade fever, tachypnea, tachycardia, leukocytosis. No cler source of infection, initially thought appendicitis versus pyelonephritis - Cultures (urine and blood) unremarkable so far - Continue zosyn - Stool for c.diff had positive antigen - indicated colonization. Only 1 day of  vanco PO administered 6/8 while awaiting C.diff results    Metabolic acidosis - Likely due to diarrhea - Resolved as diarrhea resolved   Syncope, chronic diastolic CHF - ECHO notable for stable EF 55 % with grade II diastolic CHF - Compensated   Atrial Fibrillation CHA2DS2-VASc score 6 - Due to non compliance  - Has had atrial fibrillation with rapid ventricular rate requiring Cardizem drip on and off, last Cardizem drip stopped at 7 pm 6/9. Currently on Cardizem 60 mg Q 8 hours  - Was on heparin drip but this was stopped due to drop in hemoglobin from 11.9 to 7.5 on 6/10 - Appreciate cardiology following  Acute blood loss anemia - Due to heparin - As indicated above, heparin drip stopped 6/10 due to drop in hgb - Started protonix drip 6/11 - S/P 2 U PRBC transfusion 6/10 - Hgb stable this am, 9.3  Hypokalemia  - Due to GI losses - Supplemented  - Check BMP in am  Essential hypertension - Continue lisinopril 10 mg daily, Cardizem 60 mg Q 8 hours   Hyperlipidemia - Continue atorvastatin 20 mg daily  Adrenal mass, ovarian cyst  -Incidental 12 mm enhancing nodule in the right adrenal gland seen on CT - Based on MRI, likely benign nodules  Tobacco Abuse - Cessation counseling provided   DVT prophylaxis: use SCD's  bilaterally  Code Status: Full  Family Communication: Family not at the bedside this am; spoke with her grandson this am over the phone  Disposition Plan: not stable for discharge at this point, may transfer to telemetry floor    Consultants:   Surgery   GI  Cardiology   Procedures:   2 D ECHO 01/25/2016 -  ejection fraction 60% with grade 2 diastolic dysfunction  2 U PRBC transfusion 01/29/2016  Antimicrobials:  Zosyn 6/5 -->  Vanco PO 01/27/2016 --> 01/28/2016    Subjective: No overnight events.   Objective: Filed Vitals:   01/29/16 2159 01/29/16 2253 01/30/16 0310 01/30/16 0509  BP: 119/53 144/74 132/59 141/61  Pulse:  66 61   Temp:  98.1 F (36.7 C) 98.5 F (36.9 C)   TempSrc:  Oral Oral   Resp:  21 19   Height:      Weight:      SpO2:  99% 98%     Intake/Output Summary (Last 24 hours) at 01/30/16 0738 Last data filed at 01/30/16 0600  Gross per 24 hour  Intake   2430 ml  Output   3750 ml  Net  -1320 ml   Filed Weights   01/24/16 1956  Weight: 55.7 kg (122 lb 12.7 oz)    Examination:  General exam: calm, comfortable  Respiratory system: no wheezing, no rhonchi  Cardiovascular system: S1 & S2 (+), rate controlled  Gastrointestinal system: (+) BS, nontender  Central nervous system: Nonfocal  Extremities: No swelling, palpable pulses  Skin: no lesions, no ulcers  Psychiatry: Normal mood, affect.   Data Reviewed: I have personally reviewed following labs and imaging studies  CBC:  Recent Labs Lab 01/24/16 1135 01/25/16 0413 01/26/16 0422 01/27/16 0352 01/28/16 0046 01/29/16 0206 01/30/16 0433  WBC 16.3* 15.3* 17.6* 15.1* 15.0*  --  13.9*  NEUTROABS 13.1*  --   --   --   --   --   --   HGB 13.0 11.1* 12.1 11.4* 11.9* 7.5* 9.3*  HCT 38.7 34.0* 36.7 34.4* 35.2* 22.2* 27.3*  MCV 87.6 87.9 87.6 85.8 84.4  --  84.3  PLT 232 210 260 258 303  --  544   Basic Metabolic Panel:  Recent Labs Lab 01/25/16 0724 01/25/16 1322 01/26/16 0422 01/27/16 0352 01/28/16 0046 01/30/16 0433  NA 138  --  134* 134* 134* 140  K 3.0*  --  5.1 4.3 4.0 3.0*  CL 107  --  107 107 107 111  CO2 20*  --  17* 18* 15* 24  GLUCOSE 90  --  176* 82 87 103*  BUN 8  --  <5* 5* 6 6  CREATININE 0.84  --  0.56 0.69 0.76 0.57  CALCIUM 7.9*  --  8.4* 8.6* 8.3* 7.8*  MG  --  1.8 1.7  1.9  --  1.9   GFR: Estimated Creatinine Clearance: 48.3 mL/min (by C-G formula based on Cr of 0.57). Liver Function Tests:  Recent Labs Lab 01/24/16 1135 01/25/16 0413 01/26/16 0422  AST '21 18 18  '$ ALT '28 26 22  '$ ALKPHOS 67 65 72  BILITOT 1.4* 1.8* 1.2  PROT 6.4* 6.1* 6.3*  ALBUMIN 3.4* 2.9* 2.7*    Recent Labs Lab 01/24/16 1135  LIPASE 20   No results for input(s): AMMONIA in the last 168 hours. Coagulation Profile:  Recent Labs Lab 01/24/16 1549 01/25/16 0413 01/28/16 1148 01/29/16 0206 01/30/16 0433  INR 1.39 1.22 1.13 1.27 1.09   Cardiac Enzymes:  No results for input(s): CKTOTAL, CKMB, CKMBINDEX, TROPONINI in the last 168 hours. BNP (last 3 results) No results for input(s): PROBNP in the last 8760 hours. HbA1C: No results for input(s): HGBA1C in the last 72 hours. CBG:  Recent Labs Lab 01/24/16 1136 01/29/16 0158  GLUCAP 133* 230*   Lipid Profile: No results for input(s): CHOL, HDL, LDLCALC, TRIG, CHOLHDL, LDLDIRECT in the last 72 hours. Thyroid Function Tests: No results for input(s): TSH, T4TOTAL, FREET4, T3FREE, THYROIDAB in the last 72 hours. Anemia Panel: No results for input(s): VITAMINB12, FOLATE, FERRITIN, TIBC, IRON, RETICCTPCT in the last 72 hours. Urine analysis:    Component Value Date/Time   COLORURINE AMBER* 01/24/2016 1940   APPEARANCEUR CLEAR 01/24/2016 1940   LABSPEC 1.020 01/24/2016 1940   PHURINE 5.5 01/24/2016 1940   GLUCOSEU NEGATIVE 01/24/2016 1940   HGBUR NEGATIVE 01/24/2016 1940   BILIRUBINUR SMALL* 01/24/2016 1940   KETONESUR 15* 01/24/2016 1940   PROTEINUR 30* 01/24/2016 1940   NITRITE NEGATIVE 01/24/2016 1940   LEUKOCYTESUR NEGATIVE 01/24/2016 1940   Sepsis Labs: '@LABRCNTIP'$ (procalcitonin:4,lacticidven:4)   Culture, blood (routine x 2)     Status: None (Preliminary result)   Collection Time: 01/24/16  2:54 PM  Result Value Ref Range Status   Specimen Description BLOOD RIGHT ANTECUBITAL  Final   Special  Requests BOTTLES DRAWN AEROBIC AND ANAEROBIC 10CC  Final   Culture NO GROWTH 1 DAY  Final   Report Status PENDING  Incomplete  Culture, blood (routine x 2)     Status: None (Preliminary result)   Collection Time: 01/24/16  3:00 PM  Result Value Ref Range Status   Specimen Description BLOOD RIGHT HAND  Final   Special Requests BOTTLES DRAWN AEROBIC ONLY 10CC  Final   Culture NO GROWTH 1 DAY  Final   Report Status PENDING  Incomplete  MRSA PCR Screening     Status: None   Collection Time: 01/24/16  8:00 PM  Result Value Ref Range Status   MRSA by PCR NEGATIVE NEGATIVE Final  C difficile quick scan w PCR reflex     Status: Abnormal   Collection Time: 01/25/16  8:50 PM  Result Value Ref Range Status   C Diff antigen POSITIVE (A) NEGATIVE Final   C Diff toxin NEGATIVE NEGATIVE Final   C Diff interpretation   Final    C. difficile present, but toxin not detected. This indicates colonization. In most cases, this does not require treatment. If patient has signs and symptoms consistent with colitis, consider treatment. Requires ENTERIC precautions.      Radiology Studies: Ct Abdomen Pelvis W Contrast 01/29/2016  1. Again noted abnormal mild thickening and enhancement of the tip of the appendix measures at least 8 mm in axial image 71. There is irregularity of anterior wall of the appendix and there is a enhancing fluid collection just anterior to tip of the appendix surrounded by small bowel loops. This collection is best seen in axial images 69 measures 1.5 cm. Again findings are consistent with appendiceal perforation and small periappendiceal abscess. Mild thickening of small bowel wall surrounding this collection probable mild satellite inflammation. Again there is no evidence of extravasation of rectal contrast material. 2. No small bowel obstruction. 3. No hydronephrosis or hydroureter. 4. Again noted focal striated nephrogram mid and lower pole of the left kidney suspicious for mild focal  pyelonephritis. Bilateral renal symmetrical excretion. 5. Atherosclerotic calcifications of abdominal aorta and iliac arteries. 6. There is a right ovarian cyst measures 2.6 cm. E  Mr Pelvis W Wo Contrast 01/27/2016  1. Very limited examination due to patient motion. 2. Small adrenal gland nodules are likely benign adenomas. 3. Suspect mild right lower quadrant inflammatory process but the appendix cystic well visualized. 4. Right ovarian cyst and suspect left-sided hydrosalpinx. 5. Bladder distention. 6. Grossly normal appearance of the pancreaticobiliary tree for age. Electronically Signed   By: Marijo Sanes M.D.   On: 01/27/2016 13:53   Mr Abdomen W Wo Contrast 01/27/2016  1. Very limited examination due to patient motion. 2. Small adrenal gland nodules are likely benign adenomas. 3. Suspect mild right lower quadrant inflammatory process but the appendix cystic well visualized. 4. Right ovarian cyst and suspect left-sided hydrosalpinx. 5. Bladder distention. 6. Grossly normal appearance of the pancreaticobiliary tree for age. Electronically Signed   By: Marijo Sanes M.D.   On: 01/27/2016 13:53   X-ray Chest Pa And Lateral 01/25/2016  Minimal left base atelectasis. No edema or consolidation. Heart borderline prominent but stable. No new opacity. Electronically Signed   By: Lowella Grip III M.D.   On: 01/25/2016 07:31   Ct Abdomen Pelvis W Contrast 01/24/2016  1. The findings are consistent with a perforated appendicitis. The appearance is somewhat unusual with lack of enhancement at the distal tip and discontinuity of the mucosa. While this could represent a typical appendicitis, appendicitis due to a mass at the distal tip is not completely excluded. Recommend correlation with pathology. 2. Striated nephrogram on the left with no adjacent stranding. This is an age-indeterminate finding but pyelonephritis is not excluded given the history of left-sided pain. Recommend correlation with urinalysis and  clinical exam. 3. 12 mm enhancing nodule in the right adrenal gland. Recommend MRI for further characterization. 4. Probable developing small-bowel ileus. 5. Multiple cysts in the left ovary with the largest measuring up to 2.5 cm. This is a somewhat unusual appearance in a postmenopausal woman. Recommend correlation with ultrasound for better assessment of cyst character and size. Findings called to Dr. Laneta Simmers. Electronically Signed   By: Dorise Bullion III M.D   On: 01/24/2016 14:46   Dg Abd Acute W/chest              01/24/2016  Enlargement of cardiac silhouette with minimal LEFT basilar atelectasis. Air-filled small bowel loops in the mid abdomen prominent in number and size though some gas is seen throughout the colon ; this could represent mild small bowel ileus but early versus partial small-bowel obstruction not completely excluded. Electronically Signed   By: Lavonia Dana M.D.   On: 01/24/2016 13:04      Scheduled Meds: . atorvastatin  20 mg Oral Daily  . diltiazem  60 mg Oral Q8H  . lisinopril  10 mg Oral Daily  . nicotine  14 mg Transdermal Daily  . [START ON 02/01/2016] pantoprazole  40 mg Intravenous Q12H  . piperacillin-tazobactam (ZOSYN)  IV  3.375 g Intravenous Q8H  . potassium chloride  10 mEq Intravenous Q1 Hr x 4  . sodium chloride flush  3 mL Intravenous Q12H   Continuous Infusions: . sodium chloride 100 mL/hr at 01/28/16 2155  . diltiazem (CARDIZEM) infusion Stopped (01/28/16 1915)  . pantoprozole (PROTONIX) infusion 8 mg/hr (01/29/16 2348)     LOS: 6 days    Time spent: 25 minutes Greater than 50% of the time spent on counseling and coordinating the care.   Leisa Lenz, MD [facility] Pager [phone]  If 7PM-7AM, please contact night-coverage [email or web address] Password [facility] 01/30/2016, 7:38 AM

## 2016-01-30 NOTE — Progress Notes (Addendum)
SUBJECTIVE:  No interpreter present.  Converted to NSR yesterday evening.  OBJECTIVE:   Vitals:   Filed Vitals:   01/29/16 2253 01/30/16 0310 01/30/16 0509 01/30/16 0745  BP: 144/74 132/59 141/61 154/57  Pulse: 66 61    Temp: 98.1 F (36.7 C) 98.5 F (36.9 C)  98.8 F (37.1 C)  TempSrc: Oral Oral  Axillary  Resp: 21 19  19   Height:      Weight:      SpO2: 99% 98%  99%   I&O's:   Intake/Output Summary (Last 24 hours) at 01/30/16 1121 Last data filed at 01/30/16 1030  Gross per 24 hour  Intake   3445 ml  Output   3450 ml  Net     -5 ml   TELEMETRY: Reviewed telemetry pt in NSR:     PHYSICAL EXAM General: Well developed, well nourished, in no acute distress Head: Eyes PERRLA, No xanthomas.   Normal cephalic and atramatic  Lungs:   Clear bilaterally to auscultation and percussion. Heart:   HRRR S1 S2 Pulses are 2+ & equal.            No carotid bruit. No JVD.  No abdominal bruits. No femoral bruits. Abdomen: Bowel sounds are positive, abdomen soft and non-tender without masses or                  Hernia's noted. Msk:  Back normal, normal gait. Normal strength and tone for age. Extremities:   No clubbing, cyanosis or edema.  DP +1 Neuro: Alert and oriented X 3. Psych:  Good affect, responds appropriately   LABS: Basic Metabolic Panel:  Recent Labs  16/10/96 0046 01/30/16 0433  NA 134* 140  K 4.0 3.0*  CL 107 111  CO2 15* 24  GLUCOSE 87 103*  BUN 6 6  CREATININE 0.76 0.57  CALCIUM 8.3* 7.8*  MG  --  1.9   Liver Function Tests: No results for input(s): AST, ALT, ALKPHOS, BILITOT, PROT, ALBUMIN in the last 72 hours. No results for input(s): LIPASE, AMYLASE in the last 72 hours. CBC:  Recent Labs  01/28/16 0046 01/29/16 0206 01/30/16 0433  WBC 15.0*  --  13.9*  HGB 11.9* 7.5* 9.3*  HCT 35.2* 22.2* 27.3*  MCV 84.4  --  84.3  PLT 303  --  226   Cardiac Enzymes: No results for input(s): CKTOTAL, CKMB, CKMBINDEX, TROPONINI in the last 72  hours. BNP: Invalid input(s): POCBNP D-Dimer: No results for input(s): DDIMER in the last 72 hours. Hemoglobin A1C: No results for input(s): HGBA1C in the last 72 hours. Fasting Lipid Panel: No results for input(s): CHOL, HDL, LDLCALC, TRIG, CHOLHDL, LDLDIRECT in the last 72 hours. Thyroid Function Tests: No results for input(s): TSH, T4TOTAL, T3FREE, THYROIDAB in the last 72 hours.  Invalid input(s): FREET3 Anemia Panel: No results for input(s): VITAMINB12, FOLATE, FERRITIN, TIBC, IRON, RETICCTPCT in the last 72 hours. Coag Panel:   Lab Results  Component Value Date   INR 1.09 01/30/2016   INR 1.27 01/29/2016   INR 1.13 01/28/2016    RADIOLOGY: X-ray Chest Pa And Lateral  01/25/2016  CLINICAL DATA:  Hypertension. Preoperative cardiovascular evaluation EXAM: CHEST  2 VIEW COMPARISON:  January 24, 2016 FINDINGS: There is persistent minimal left base atelectasis. Lungs elsewhere clear. Heart is borderline enlarged with pulmonary vascularity within normal limits. No adenopathy. No bone lesions. IMPRESSION: Minimal left base atelectasis. No edema or consolidation. Heart borderline prominent but stable. No new opacity. Electronically Signed  By: Bretta Bang III M.D.   On: 01/25/2016 07:31   Mr Pelvis W Wo Contrast  01/27/2016  CLINICAL DATA:  Possible perforated appendicitis. Evaluate prominent pancreatic duct and right adrenal gland nodule and ovarian cysts. EXAM: MRI ABDOMEN AND PELVIS WITHOUT AND WITH CONTRAST TECHNIQUE: Multiplanar multisequence MR imaging of the abdomen and pelvis was performed both before and after the administration of intravenous contrast. CONTRAST:  12 cc MultiHance. COMPARISON:  CT scan 01/24/2016 FINDINGS: COMBINED FINDINGS FOR BOTH MR ABDOMEN AND PELVIS Examination is quite limited due to respiratory motion and patient motion. Lower chest: The lung bases are grossly clear. No pleural or pericardial effusion. Hepatobiliary: No focal hepatic lesions or intrahepatic  biliary dilatation. The gallbladder is mildly distended. No findings for acute cholecystitis. No definite gallstones. No common bile duct dilatation. Pancreas: No mass, inflammation ductal dilatation. Mild atrophy of the pancreatic tail. Spleen:  Normal size.  No focal lesions. Adrenals/Urinary Tract: Small bilateral adrenal gland nodules demonstrate loss of signal intensity on the out of phase T1 weighted gradient echo sequences most consistent with benign adenomas. The kidneys are unremarkable. Stomach/Bowel: The stomach, duodenum, small bowel and colon are grossly normal. The appendix is not well visualized due to motion artifact. Vascular/Lymphatic: No mesenteric or retroperitoneal mass or abnormal liver aorta is normal in caliber advanced atherosclerotic calcifications. Reproductive: The uterus is grossly normal. There is a simple appearing cyst associated with the right ovary measuring 2.4 cm. Serpiginous fluid collections near the left ovary likely hydrosalpinx. Other: Moderate distention of the bladder is noted. No pelvic mass or adenopathy. Presacral edema is no minimal fluid in the cul-de-sac. Musculoskeletal:  Advanced degenerative changes involving the spine. IMPRESSION: 1. Very limited examination due to patient motion. 2. Small adrenal gland nodules are likely benign adenomas. 3. Suspect mild right lower quadrant inflammatory process but the appendix cystic well visualized. 4. Right ovarian cyst and suspect left-sided hydrosalpinx. 5. Bladder distention. 6. Grossly normal appearance of the pancreaticobiliary tree for age. Electronically Signed   By: Rudie Meyer M.D.   On: 01/27/2016 13:53   Mr Abdomen W Wo Contrast  01/27/2016  CLINICAL DATA:  Possible perforated appendicitis. Evaluate prominent pancreatic duct and right adrenal gland nodule and ovarian cysts. EXAM: MRI ABDOMEN AND PELVIS WITHOUT AND WITH CONTRAST TECHNIQUE: Multiplanar multisequence MR imaging of the abdomen and pelvis was  performed both before and after the administration of intravenous contrast. CONTRAST:  12 cc MultiHance. COMPARISON:  CT scan 01/24/2016 FINDINGS: COMBINED FINDINGS FOR BOTH MR ABDOMEN AND PELVIS Examination is quite limited due to respiratory motion and patient motion. Lower chest: The lung bases are grossly clear. No pleural or pericardial effusion. Hepatobiliary: No focal hepatic lesions or intrahepatic biliary dilatation. The gallbladder is mildly distended. No findings for acute cholecystitis. No definite gallstones. No common bile duct dilatation. Pancreas: No mass, inflammation ductal dilatation. Mild atrophy of the pancreatic tail. Spleen:  Normal size.  No focal lesions. Adrenals/Urinary Tract: Small bilateral adrenal gland nodules demonstrate loss of signal intensity on the out of phase T1 weighted gradient echo sequences most consistent with benign adenomas. The kidneys are unremarkable. Stomach/Bowel: The stomach, duodenum, small bowel and colon are grossly normal. The appendix is not well visualized due to motion artifact. Vascular/Lymphatic: No mesenteric or retroperitoneal mass or abnormal liver aorta is normal in caliber advanced atherosclerotic calcifications. Reproductive: The uterus is grossly normal. There is a simple appearing cyst associated with the right ovary measuring 2.4 cm. Serpiginous fluid collections near the left ovary  likely hydrosalpinx. Other: Moderate distention of the bladder is noted. No pelvic mass or adenopathy. Presacral edema is no minimal fluid in the cul-de-sac. Musculoskeletal:  Advanced degenerative changes involving the spine. IMPRESSION: 1. Very limited examination due to patient motion. 2. Small adrenal gland nodules are likely benign adenomas. 3. Suspect mild right lower quadrant inflammatory process but the appendix cystic well visualized. 4. Right ovarian cyst and suspect left-sided hydrosalpinx. 5. Bladder distention. 6. Grossly normal appearance of the  pancreaticobiliary tree for age. Electronically Signed   By: Rudie Meyer M.D.   On: 01/27/2016 13:53   Ct Abdomen Pelvis W Contrast  01/29/2016  CLINICAL DATA:  Vomiting blood, bloody stools, diarrhea, perforated appendicitis EXAM: CT ABDOMEN AND PELVIS WITH CONTRAST TECHNIQUE: Multidetector CT imaging of the abdomen and pelvis was performed using the standard protocol following bolus administration of intravenous contrast. CONTRAST:  ISOVUE-300 IOPAMIDOL (ISOVUE-300) INJECTION 61% COMPARISON:  01/24/2016 FINDINGS: Lung bases are unremarkable. Enhanced liver is unremarkable. Small calcified gallstones within gallbladder again noted the largest measures 4 mm. Enhanced pancreas is unremarkable. Enhanced spleen is unremarkable. There is no evidence of small bowel obstruction. Rectal contrast material was given to the patient prior to scanning. Contrast material noted in right colon and cecum. There is some reflux contrast material within terminal ileum. No extravasation of contrast material is noted. Again noted abnormal mild thickening and enhancement of the tip of the appendix measures at least 8 mm in axial image 71. There is irregularity of anterior wall of the appendix and there is a enhancing fluid collection just anterior to tip of the appendix surrounded by small bowel loops. This collection is best seen in axial images 69 measures 1.5 cm. Again findings are consistent with appendiceal perforation and small periappendiceal abscess. Mild thickening of small bowel wall surrounding this collection probable mild satellite inflammation. Again there is no evidence of extravasation of rectal contrast material. Enhanced kidneys are symmetrical in size. Again noted mild striated nephrogram in midpole lateral aspect and lower pole of the left kidney. Mild focal pyelonephritis cannot be excluded. Delayed renal images shows bilateral renal symmetrical excretion. Bilateral visualized proximal ureter is  unremarkable. The uterus is unremarkable. There is a right ovarian cyst measures 2.6 cm. IMPRESSION: 1. Again noted abnormal mild thickening and enhancement of the tip of the appendix measures at least 8 mm in axial image 71. There is irregularity of anterior wall of the appendix and there is a enhancing fluid collection just anterior to tip of the appendix surrounded by small bowel loops. This collection is best seen in axial images 69 measures 1.5 cm. Again findings are consistent with appendiceal perforation and small periappendiceal abscess. Mild thickening of small bowel wall surrounding this collection probable mild satellite inflammation. Again there is no evidence of extravasation of rectal contrast material. 2. No small bowel obstruction. 3. No hydronephrosis or hydroureter. 4. Again noted focal striated nephrogram mid and lower pole of the left kidney suspicious for mild focal pyelonephritis. Bilateral renal symmetrical excretion. 5. Atherosclerotic calcifications of abdominal aorta and iliac arteries. 6. There is a right ovarian cyst measures 2.6 cm. Electronically Signed   By: Natasha Mead M.D.   On: 01/29/2016 16:41   Ct Abdomen Pelvis W Contrast  01/24/2016  CLINICAL DATA:  Left lower quadrant abdominal pain. EXAM: CT ABDOMEN AND PELVIS WITH CONTRAST TECHNIQUE: Multidetector CT imaging of the abdomen and pelvis was performed using the standard protocol following bolus administration of intravenous contrast. CONTRAST:  ISOVUE-300 IOPAMIDOL (  ISOVUE-300) INJECTION 61% COMPARISON:  None. FINDINGS: Normal normal lung bases. The appendix is abnormal in appearance at its distal tip. The proximal 90% of the appendix enhances normally. However, there is decreased enhancement at the distal tip with some adjacent stranding and discontinuity of mucosal enhancement. The distal appendix is also mildly dilated measuring between 8 or 9 mm. There are a few dots of free air adjacent to the distal tip is well. No  other free air identified. There is a small amount of free fluid in the pelvis on the right. Cholelithiasis is identified. The liver, portal veins, and spleen are normal. The left adrenal gland is normal. There is an enhancing nodule in the right adrenal gland measuring 12 mm. Mild prominence of the pancreatic duct with no obstructing mass or cause identified. The common bile duct is normal in caliber. The pancreas is otherwise normal. A few small renal cysts are seen on the left. A few are too small to characterize. No suspicious renal masses. No ureterectasis or ureteral stones. There are regions of decreased cortical enhancement on the left with no adjacent stranding. This becomes more prominent on delayed images. No filling defects in the upper renal collecting systems. The stomach is normal. There are multiple prominent mildly dilated loops of small bowel with intervening loops of normal caliber small bowel. Given the findings in the right lower quadrant, this is favored to represent developing ileus. The colon is unremarkable. There is a non aneurysmal atherosclerotic aorta. No adenopathy. No adenopathy or suspicious mass in the pelvis. The uterus and right ovary are normal. There appear to be several cysts in the left ovary with the largest measuring up to 2.5 cm. This is asymmetric to the right. No other abnormalities are seen within the pelvis. Delayed images through the upper kidney demonstrate no filling defects in the renal collecting system. IMPRESSION: 1. The findings are consistent with a perforated appendicitis. The appearance is somewhat unusual with lack of enhancement at the distal tip and discontinuity of the mucosa. While this could represent a typical appendicitis, appendicitis due to a mass at the distal tip is not completely excluded. Recommend correlation with pathology. 2. Striated nephrogram on the left with no adjacent stranding. This is an age-indeterminate finding but pyelonephritis is  not excluded given the history of left-sided pain. Recommend correlation with urinalysis and clinical exam. 3. 12 mm enhancing nodule in the right adrenal gland. Recommend MRI for further characterization. 4. Probable developing small-bowel ileus. 5. Multiple cysts in the left ovary with the largest measuring up to 2.5 cm. This is a somewhat unusual appearance in a postmenopausal woman. Recommend correlation with ultrasound for better assessment of cyst character and size. Findings called to Dr. Clydene Pugh. Electronically Signed   By: Gerome Sam III M.D   On: 01/24/2016 14:46   Dg Abd Acute W/chest  01/24/2016  CLINICAL DATA:  RIGHT lower quadrant pain of unspecified duration EXAM: DG ABDOMEN ACUTE W/ 1V CHEST COMPARISON:  Chest radiograph 12/08/2010 FINDINGS: Enlargement of cardiac silhouette. Atherosclerotic calcification aorta. Mediastinal contours and pulmonary vascularity otherwise normal for technique. Suspected RIGHT nipple shadow, with LEFT nipple shadow projecting external to the costal margin. No definite acute infiltrate, pleural effusion or pneumothorax. Minimal subsegmental atelectasis at LEFT base. Prominent small bowel loops in mid abdomen with lesser degrees of colonic gas are seen scattered throughout the colon. No definite bowel wall thickening or free intraperitoneal air. Bones demineralized with degenerative changes lumbar spine. No urinary tract calcification. IMPRESSION: Enlargement of  cardiac silhouette with minimal LEFT basilar atelectasis. Air-filled small bowel loops in the mid abdomen prominent in number and size though some gas is seen throughout the colon ; this could represent mild small bowel ileus but early versus partial small-bowel obstruction not completely excluded. Electronically Signed   By: Ulyses SouthwardMark  Boles M.D.   On: 01/24/2016 13:04    Assessment & Plan   1. Atrial Fibrillation with RVR - has a history of PAF. On 01/25/2016, she was noted to have gone into atrial  fibrillation with RVR, with HR in the 120's - 140's. She was given IV Cardizem 5mg  and converted to NSR on 01/26/2016 at 0430. Went back into atrial fibrillation overnight on 01/27/2016.  Converted back to NSR last night.  - This patients CHA2DS2-VASc Score and unadjusted Ischemic Stroke Rate (% per year) is equal to 9.7 % stroke rate/year from a score of 6 (CHF, HTN, Female, Age, CVA (2)). Was on Coumadin PTA but had not taken for 20+ days due to running out of the medication and not having transportation to office appointments. Had been on Heparin but this has been stopped due to acute blood loss anemia with a Hbg down to 7.5 . Will restart anticoagulation once ok with GI and surgery.Talked with Pharmacy about compliance with Coumadin and INR appointments. Would prefer NOAC for compliance but financial limitations might restrict this decision. Will ask Case Management to check cost of Xarelto and Eliquis. - Now on short acting Cardizem 60mg  q8hours.  Would transition to long-acting Cardizem CD once stable.  2. EKG Changes - EKG shows new TWI in inferior and lateral leads (more prominent on tracings this admission when in NSR).  - she denies any recent chest pain or anginal equivalents.  - Echo this admission shows a preserved EF of 60-65% with no wall motion abnormalities. - could consider outpatient NST if she developed anginal symptoms.  3. Chronic Diastolic CHF - echo this admission shows an EF of 60-65% with Grade 2 DD. - does not appear volume overloaded on physical exam. - continue CCB/ACE I  4. HLD - continue statin therapy.   5. Abdominal Pain/ Possible perforated Appendicitis/ C.diff antigen Positive - Abdominal CT on admission showed findings consistent with a perforated appendicitis, a 12 mm enhancing nodule in the right adrenal gland, probable developing small-bowel ileus, and multiple cysts in the left ovary with the largest measuring up to 2.5 cm.  - Surgery was consulted and  noted the CT was concerning for a mass in the cecum.  - per admitting team and GI.   6.  Hypokalemia - replete             Armanda Magicraci Turner, MD  01/30/2016  11:21 AM

## 2016-01-31 DIAGNOSIS — K922 Gastrointestinal hemorrhage, unspecified: Secondary | ICD-10-CM

## 2016-01-31 DIAGNOSIS — R933 Abnormal findings on diagnostic imaging of other parts of digestive tract: Secondary | ICD-10-CM | POA: Diagnosis present

## 2016-01-31 LAB — CBC
HEMATOCRIT: 32.5 % — AB (ref 36.0–46.0)
HEMOGLOBIN: 11.1 g/dL — AB (ref 12.0–15.0)
MCH: 28.5 pg (ref 26.0–34.0)
MCHC: 34.2 g/dL (ref 30.0–36.0)
MCV: 83.5 fL (ref 78.0–100.0)
Platelets: 308 10*3/uL (ref 150–400)
RBC: 3.89 MIL/uL (ref 3.87–5.11)
RDW: 13.4 % (ref 11.5–15.5)
WBC: 11.4 10*3/uL — AB (ref 4.0–10.5)

## 2016-01-31 LAB — BASIC METABOLIC PANEL
ANION GAP: 10 (ref 5–15)
BUN: <5 mg/dL — ABNORMAL LOW (ref 6–20)
CO2: 24 mmol/L (ref 22–32)
Calcium: 8.6 mg/dL — ABNORMAL LOW (ref 8.9–10.3)
Chloride: 104 mmol/L (ref 101–111)
Creatinine, Ser: 0.53 mg/dL (ref 0.44–1.00)
GFR calc non Af Amer: >60 mL/min (ref >60)
Glucose, Bld: 129 mg/dL — ABNORMAL HIGH (ref 65–99)
POTASSIUM: 3.1 mmol/L — AB (ref 3.5–5.1)
Sodium: 138 mmol/L (ref 135–145)

## 2016-01-31 LAB — PROTIME-INR
INR: 1.07 (ref 0.00–1.49)
Prothrombin Time: 14.1 seconds (ref 11.6–15.2)

## 2016-01-31 LAB — HEPARIN LEVEL (UNFRACTIONATED): Heparin Unfractionated: 0.63 IU/mL (ref 0.30–0.70)

## 2016-01-31 MED ORDER — AMOXICILLIN-POT CLAVULANATE 875-125 MG PO TABS
1.0000 | ORAL_TABLET | Freq: Two times a day (BID) | ORAL | Status: DC
  Administered 2016-01-31 – 2016-02-03 (×6): 1 via ORAL
  Filled 2016-01-31 (×6): qty 1

## 2016-01-31 MED ORDER — PANTOPRAZOLE SODIUM 40 MG PO TBEC
40.0000 mg | DELAYED_RELEASE_TABLET | Freq: Two times a day (BID) | ORAL | Status: DC
  Administered 2016-01-31 – 2016-02-03 (×6): 40 mg via ORAL
  Filled 2016-01-31 (×6): qty 1

## 2016-01-31 MED ORDER — PEG-KCL-NACL-NASULF-NA ASC-C 100 G PO SOLR
0.5000 | Freq: Once | ORAL | Status: DC
  Filled 2016-01-31 (×2): qty 1

## 2016-01-31 MED ORDER — DILTIAZEM HCL 100 MG IV SOLR
5.0000 mg/h | INTRAVENOUS | Status: DC
  Administered 2016-01-31: 5 mg/h via INTRAVENOUS
  Administered 2016-01-31: 10 mg/h via INTRAVENOUS

## 2016-01-31 MED ORDER — POTASSIUM CHLORIDE CRYS ER 20 MEQ PO TBCR
40.0000 meq | EXTENDED_RELEASE_TABLET | Freq: Once | ORAL | Status: AC
  Administered 2016-01-31: 40 meq via ORAL
  Filled 2016-01-31: qty 2

## 2016-01-31 MED ORDER — SACCHAROMYCES BOULARDII 250 MG PO CAPS
250.0000 mg | ORAL_CAPSULE | Freq: Two times a day (BID) | ORAL | Status: DC
  Administered 2016-01-31 – 2016-02-03 (×6): 250 mg via ORAL
  Filled 2016-01-31 (×6): qty 1

## 2016-01-31 MED ORDER — DILTIAZEM HCL 60 MG PO TABS
60.0000 mg | ORAL_TABLET | Freq: Four times a day (QID) | ORAL | Status: DC
  Administered 2016-01-31 – 2016-02-02 (×10): 60 mg via ORAL
  Filled 2016-01-31 (×10): qty 1

## 2016-01-31 MED ORDER — HEPARIN (PORCINE) IN NACL 100-0.45 UNIT/ML-% IJ SOLN
1000.0000 [IU]/h | INTRAMUSCULAR | Status: AC
  Administered 2016-01-31: 1100 [IU]/h via INTRAVENOUS
  Filled 2016-01-31: qty 250

## 2016-01-31 MED ORDER — PEG-KCL-NACL-NASULF-NA ASC-C 100 G PO SOLR: 1.0000 | Freq: Once | ORAL | Status: DC

## 2016-01-31 MED ORDER — PEG-KCL-NACL-NASULF-NA ASC-C 100 G PO SOLR
0.5000 | Freq: Once | ORAL | Status: AC
  Administered 2016-01-31: 100 g via ORAL

## 2016-01-31 MED ORDER — DILTIAZEM HCL 100 MG IV SOLR
INTRAVENOUS | Status: AC
  Filled 2016-01-31: qty 100

## 2016-01-31 MED ORDER — POTASSIUM CHLORIDE 10 MEQ/100ML IV SOLN: 10.0000 meq | INTRAVENOUS | Status: DC

## 2016-01-31 NOTE — Progress Notes (Addendum)
Patient ID: Linda [name], female   DOB: [birth year], 75 y.o.   MRN: [id number]  PROGRESS NOTE    Linda [name]  FFM:[id number] DOB: [birth year] DOA: 01/24/2016  PCP: Webb Silversmith, NP   Brief Narrative:  75 y.o. female with HTN, HLD, Afib on Coumadin, Tobacco/COPD, h/o CVA, brought by EMS from urgent care facility due to loss of consciousness, abd pain with cramps, over 10 episodes of loose BM's. She thought she had food poisoning and was taking imodium but her symptoms have not improved.   In ED, CT abdomen and pelvis notable for questionable  perforated appendicitis. Incidental 12 mm enhancing nodule in the right adrenal gland.Probable developing small-bowel ileus. White count was 16.3. Lactic acid 0.9. Troponin 0.07. She was started on Zosyn and was seen by surgery in consultation.   During this hospital stay she had MRI 01/27/2016 which showed small adrenal gland nodules likely benign, mild right lower quadrant inflammatory process but appendix with visualized. She also had repeat CT abdomen 01/29/2016 which showed abnormal mild thickening and enhancement of the tip of the appendix with enhancing fluid collection surrounded by small bowel loops, consistent with appendiceal perforation and small perippendiceal abscess. Surgery will be planned on outpatient basis. Her Zosyn has been switched to Augmentin on 01/31/2016  Please note we also had a GI consultation for evaluation and possible colonoscopy. We'll follow-up in their recommendations.  Cardiology has seen the patient in consultation because of atrial fibrillation. Patient in and out of A. fib and at this time cardiology will continue to manage heart rate with Cardizem by mouth.  Of note, patient had dropped in hemoglobin on 01/29/2016. At that time CT abdomen was repeated to rule out potential GI bleed. Her heparin drip was stopped and she was started on Protonix drip. Because her hemoglobin has been stable in last 48 hours heparin drip will  be resumed 01/31/2016.  Marland Kitchen   Assessment & Plan:  Acute abdominal pain / appendiceal perforation  - On admission, CT abdomen and pelvis showed questioanble perforated appendicitis  - MRI 01/27/2016 showed small adrenal gland nodules likely benign, mild right lower quadrant inflammatory process but appendix visualized - Repeat CT abdomen 6/10 showed again noted abnormal mild thickening and enhancement of the tip of the appendix measures at least 8 mm, irregularity of anterior wall of the appendix and enhancing fluid collection anterior to tip of the appendix surrounded by small bowel loops, findings are consistent with appendiceal perforation and small periappendiceal abscess.  - As noted above, surgery plans outpatient follow-up. Zosyn was changed to Augmentin today 01/31/2016. Florastor added. - GI may still do colonoscopy, will follow-up on their recommendation - Diet as tolerated, will advance if colonoscopy will not be done today  Sepsis due to possible perforated appendicitis versus left pyelonephritis / Leukocytosis / Diarrhea  - Sepsis criteria met on admission with low grade fever, tachypnea, tachycardia, leukocytosis. No cler source of infection, initially thought appendicitis versus pyelonephritis - Blood and urine culture with no growth so far - Zosyn has been changed to Augmentin today - Off note, patient had stool for C. difficile which was positive for antigen but not toxin which indicates colonization. Stool for C. difficile was repeated and it was negative. Patient already received 1 day of by mouth vancomycin on 01/27/2016.  Metabolic acidosis - Resolved as diarrhea resolved   Syncope, chronic diastolic CHF - ECHO notable for stable EF 55 % with grade II diastolic CHF - Compensated   Atrial Fibrillation CHA2DS2-VASc  score 6 - Due to non compliance  - In and out of atrial fibrillation requiring Cardizem drip. Last Cardizem drip 01/28/2016 stopped at 7 PM - She is  currently on Cardizem 60 mg every 6 hours - She was off of heparin drip from 01/29/2016 because of drop in hemoglobin from 11.9-7.5. She has received 2 units of PRBC blood transfusion. - Her hemoglobin is 11.1 this morning so we'll resume heparin drip and will monitor daily CBC as well as for any sign of bleeding  Acute blood loss anemia - Due to heparin - As noted above, heparin drip stopped at 01/29/2016 because of drop in hemoglobin - Resume heparin drip today is hemoglobin stable in past 24 hours, 11.1 this morning - S/P 2 U PRBC transfusion 6/10  Hypokalemia  - Due to GI losses - Supplemented daily   Essential hypertension - Continue lisinopril 10 mg daily, Cardizem 60 mg Q 8 hours   Hyperlipidemia - Continue atorvastatin 20 mg daily  Adrenal mass, ovarian cyst  -Incidental 12 mm enhancing nodule in the right adrenal gland seen on CT - Based on MRI, likely benign nodules  Tobacco Abuse - Cessation counseling provided     DVT prophylaxis: since hgb stable now will resume heparin and will monitor CBC daily and for any sign of bleeding  Code Status: Full  Family Communication: I called patient's grandson for update [phone] Disposition Plan: not stable for discharge at this point, may transfer to telemetry floor    Consultants:   Surgery   GI  Cardiology   Procedures:   2 D ECHO 01/25/2016 - ejection fraction 60% with grade 2 diastolic dysfunction  2 U PRBC transfusion 01/29/2016  Antimicrobials:  Zosyn 6/5 -->  Vanco PO 01/27/2016 --> 01/28/2016    Subjective: No overnight events. No reports of abdominal pain.  Objective: Filed Vitals:   01/31/16 0800 01/31/16 0900 01/31/16 1000 01/31/16 1122  BP: 132/79 126/78 120/82   Pulse:      Temp:    98.4 F (36.9 C)  TempSrc:    Oral  Resp: 19 29 25    Height:      Weight:      SpO2: 97% 98% 98%     Intake/Output Summary (Last 24 hours) at 01/31/16 1158 Last data filed at 01/31/16 1000  Gross per  24 hour  Intake   1950 ml  Output   1950 ml  Net      0 ml   Filed Weights   01/24/16 1956  Weight: 55.7 kg (122 lb 12.7 oz)    Examination:  General exam: No acute distress Respiratory system: Bilateral air entry, no wheezing Cardiovascular system: Appreciate S1-S2, rate controlled Gastrointestinal system: Nontender abdomen, appreciate bowel sounds Central nervous system: No focal deficits Extremities: No lower extremity edema, pulses palpable  Skin: Skin is warm and dry Psychiatry: Oriented to time, place and person, no agitation or restlessness.   Data Reviewed: I have personally reviewed following labs and imaging studies  CBC:  Recent Labs Lab 01/26/16 0422 01/27/16 0352 01/28/16 0046 01/29/16 0206 01/30/16 0433 01/31/16 0452  WBC 17.6* 15.1* 15.0*  --  13.9* 11.4*  HGB 12.1 11.4* 11.9* 7.5* 9.3* 11.1*  HCT 36.7 34.4* 35.2* 22.2* 27.3* 32.5*  MCV 87.6 85.8 84.4  --  84.3 83.5  PLT 260 258 303  --  226 638   Basic Metabolic Panel:  Recent Labs Lab 01/25/16 1322 01/26/16 0422 01/27/16 0352 01/28/16 0046 01/30/16 0433 01/31/16 0452  NA  --  134* 134* 134* 140 138  K  --  5.1 4.3 4.0 3.0* 3.1*  CL  --  107 107 107 111 104  CO2  --  17* 18* 15* 24 24  GLUCOSE  --  176* 82 87 103* 129*  BUN  --  <5* 5* 6 6 <5*  CREATININE  --  0.56 0.69 0.76 0.57 0.53  CALCIUM  --  8.4* 8.6* 8.3* 7.8* 8.6*  MG 1.8 1.7 1.9  --  1.9  --    GFR: Estimated Creatinine Clearance: 48.3 mL/min (by C-G formula based on Cr of 0.53). Liver Function Tests:  Recent Labs Lab 01/25/16 0413 01/26/16 0422  AST 18 18  ALT 26 22  ALKPHOS 65 72  BILITOT 1.8* 1.2  PROT 6.1* 6.3*  ALBUMIN 2.9* 2.7*   No results for input(s): LIPASE, AMYLASE in the last 168 hours. No results for input(s): AMMONIA in the last 168 hours. Coagulation Profile:  Recent Labs Lab 01/25/16 0413 01/28/16 1148 01/29/16 0206 01/30/16 0433 01/31/16 0452  INR 1.22 1.13 1.27 1.09 1.07   Cardiac  Enzymes: No results for input(s): CKTOTAL, CKMB, CKMBINDEX, TROPONINI in the last 168 hours. BNP (last 3 results) No results for input(s): PROBNP in the last 8760 hours. HbA1C: No results for input(s): HGBA1C in the last 72 hours. CBG:  Recent Labs Lab 01/29/16 0158  GLUCAP 230*   Lipid Profile: No results for input(s): CHOL, HDL, LDLCALC, TRIG, CHOLHDL, LDLDIRECT in the last 72 hours. Thyroid Function Tests: No results for input(s): TSH, T4TOTAL, FREET4, T3FREE, THYROIDAB in the last 72 hours. Anemia Panel: No results for input(s): VITAMINB12, FOLATE, FERRITIN, TIBC, IRON, RETICCTPCT in the last 72 hours. Urine analysis:    Component Value Date/Time   COLORURINE AMBER* 01/24/2016 1940   APPEARANCEUR CLEAR 01/24/2016 1940   LABSPEC 1.020 01/24/2016 1940   PHURINE 5.5 01/24/2016 1940   GLUCOSEU NEGATIVE 01/24/2016 1940   HGBUR NEGATIVE 01/24/2016 1940   BILIRUBINUR SMALL* 01/24/2016 1940   KETONESUR 15* 01/24/2016 1940   PROTEINUR 30* 01/24/2016 1940   NITRITE NEGATIVE 01/24/2016 1940   LEUKOCYTESUR NEGATIVE 01/24/2016 1940   Sepsis Labs: @LABRCNTIP (procalcitonin:4,lacticidven:4)   Culture, blood (routine x 2)     Status: None (Preliminary result)   Collection Time: 01/24/16  2:54 PM  Result Value Ref Range Status   Specimen Description BLOOD RIGHT ANTECUBITAL  Final   Special Requests BOTTLES DRAWN AEROBIC AND ANAEROBIC 10CC  Final   Culture NO GROWTH 1 DAY  Final   Report Status PENDING  Incomplete  Culture, blood (routine x 2)     Status: None (Preliminary result)   Collection Time: 01/24/16  3:00 PM  Result Value Ref Range Status   Specimen Description BLOOD RIGHT HAND  Final   Special Requests BOTTLES DRAWN AEROBIC ONLY 10CC  Final   Culture NO GROWTH 1 DAY  Final   Report Status PENDING  Incomplete  MRSA PCR Screening     Status: None   Collection Time: 01/24/16  8:00 PM  Result Value Ref Range Status   MRSA by PCR NEGATIVE NEGATIVE Final  C difficile  quick scan w PCR reflex     Status: Abnormal   Collection Time: 01/25/16  8:50 PM  Result Value Ref Range Status   C Diff antigen POSITIVE (A) NEGATIVE Final   C Diff toxin NEGATIVE NEGATIVE Final   C Diff interpretation   Final    C. difficile present, but toxin not detected. This indicates colonization. In most  cases, this does not require treatment. If patient has signs and symptoms consistent with colitis, consider treatment. Requires ENTERIC precautions.      Radiology Studies: Ct Abdomen Pelvis W Contrast 01/29/2016  1. Again noted abnormal mild thickening and enhancement of the tip of the appendix measures at least 8 mm in axial image 71. There is irregularity of anterior wall of the appendix and there is a enhancing fluid collection just anterior to tip of the appendix surrounded by small bowel loops. This collection is best seen in axial images 69 measures 1.5 cm. Again findings are consistent with appendiceal perforation and small periappendiceal abscess. Mild thickening of small bowel wall surrounding this collection probable mild satellite inflammation. Again there is no evidence of extravasation of rectal contrast material. 2. No small bowel obstruction. 3. No hydronephrosis or hydroureter. 4. Again noted focal striated nephrogram mid and lower pole of the left kidney suspicious for mild focal pyelonephritis. Bilateral renal symmetrical excretion. 5. Atherosclerotic calcifications of abdominal aorta and iliac arteries. 6. There is a right ovarian cyst measures 2.6 cm. E  Mr Pelvis W Wo Contrast 01/27/2016  1. Very limited examination due to patient motion. 2. Small adrenal gland nodules are likely benign adenomas. 3. Suspect mild right lower quadrant inflammatory process but the appendix cystic well visualized. 4. Right ovarian cyst and suspect left-sided hydrosalpinx. 5. Bladder distention. 6. Grossly normal appearance of the pancreaticobiliary tree for age. Electronically Signed   By: Marijo Sanes M.D.   On: 01/27/2016 13:53   Mr Abdomen W Wo Contrast 01/27/2016  1. Very limited examination due to patient motion. 2. Small adrenal gland nodules are likely benign adenomas. 3. Suspect mild right lower quadrant inflammatory process but the appendix cystic well visualized. 4. Right ovarian cyst and suspect left-sided hydrosalpinx. 5. Bladder distention. 6. Grossly normal appearance of the pancreaticobiliary tree for age. Electronically Signed   By: Marijo Sanes M.D.   On: 01/27/2016 13:53   X-ray Chest Pa And Lateral 01/25/2016  Minimal left base atelectasis. No edema or consolidation. Heart borderline prominent but stable. No new opacity. Electronically Signed   By: Lowella Grip III M.D.   On: 01/25/2016 07:31   Ct Abdomen Pelvis W Contrast 01/24/2016  1. The findings are consistent with a perforated appendicitis. The appearance is somewhat unusual with lack of enhancement at the distal tip and discontinuity of the mucosa. While this could represent a typical appendicitis, appendicitis due to a mass at the distal tip is not completely excluded. Recommend correlation with pathology. 2. Striated nephrogram on the left with no adjacent stranding. This is an age-indeterminate finding but pyelonephritis is not excluded given the history of left-sided pain. Recommend correlation with urinalysis and clinical exam. 3. 12 mm enhancing nodule in the right adrenal gland. Recommend MRI for further characterization. 4. Probable developing small-bowel ileus. 5. Multiple cysts in the left ovary with the largest measuring up to 2.5 cm. This is a somewhat unusual appearance in a postmenopausal woman. Recommend correlation with ultrasound for better assessment of cyst character and size. Findings called to Dr. Laneta Simmers. Electronically Signed   By: Dorise Bullion III M.D   On: 01/24/2016 14:46   Dg Abd Acute W/chest              01/24/2016  Enlargement of cardiac silhouette with minimal LEFT basilar atelectasis.  Air-filled small bowel loops in the mid abdomen prominent in number and size though some gas is seen throughout the colon ; this could  represent mild small bowel ileus but early versus partial small-bowel obstruction not completely excluded. Electronically Signed   By: Lavonia Dana M.D.   On: 01/24/2016 13:04     Scheduled Meds: . amoxicillin-clavulanate  1 tablet Oral Q12H  . atorvastatin  20 mg Oral Daily  . diltiazem (CARDIZEM) infusion      . diltiazem  60 mg Oral Q6H  . lisinopril  10 mg Oral Daily  . nicotine  14 mg Transdermal Daily  . [START ON 02/01/2016] pantoprazole  40 mg Intravenous Q12H  . saccharomyces boulardii  250 mg Oral BID  . sodium chloride flush  3 mL Intravenous Q12H   Continuous Infusions: . sodium chloride 50 mL/hr at 01/31/16 1000  . pantoprozole (PROTONIX) infusion 8 mg/hr (01/31/16 1000)     LOS: 7 days    Time spent: 25 minutes Greater than 50% of the time spent on counseling and coordinating the care.   Leisa Lenz, MD [facility] Pager [phone]  If 7PM-7AM, please contact night-coverage [email or web address] Password [facility] 01/31/2016, 11:58 AM

## 2016-01-31 NOTE — Progress Notes (Signed)
Progress Note   Subjective  Eating regular lunch, no further bleeding per nurse, and hgb stable post transfusions over weekend Spoke with pt via interpreter- no c/o abdominal pain, feels better Iv heparin to be restarted hgb 11.1   Objective   Vital signs in last 24 hours: Temp:  [98.1 F (36.7 C)-98.8 F (37.1 C)] 98.4 F (36.9 C) (06/12 1122) Resp:  [14-29] 21 (06/12 1100) BP: (104-148)/(59-82) 104/68 mmHg (06/12 1100) SpO2:  [96 %-99 %] 97 % (06/12 1100) Last BM Date:  (reported to me over the weekend) General:   Asian female in NAD Heart:  Regular rate and rhythm; no murmurs Lungs: Respirations even and unlabored, lungs CTA bilaterally Abdomen:  Soft, nontender and nondistended. Normal bowel sounds. Extremities:  Without edema. Neurologic:  Alert and oriented,  grossly normal neurologically. Psych:  Cooperative. Normal mood and affect.  Intake/Output from previous day: 06/11 0701 - 06/12 0700 In: 2725 [I.V.:2275; IV Piggyback:450] Out: 2275 [Urine:2275] Intake/Output this shift: Total I/O In: 375 [I.V.:375] Out: -   Lab Results:  Recent Labs  01/29/16 0206 01/30/16 0433 01/31/16 0452  WBC  --  13.9* 11.4*  HGB 7.5* 9.3* 11.1*  HCT 22.2* 27.3* 32.5*  PLT  --  226 308   BMET  Recent Labs  01/30/16 0433 01/31/16 0452  NA 140 138  K 3.0* 3.1*  CL 111 104  CO2 24 24  GLUCOSE 103* 129*  BUN 6 <5*  CREATININE 0.57 0.53  CALCIUM 7.8* 8.6*   LFT No results for input(s): PROT, ALBUMIN, AST, ALT, ALKPHOS, BILITOT, BILIDIR, IBILI in the last 72 hours. PT/INR  Recent Labs  01/30/16 0433 01/31/16 0452  LABPROT 14.3 14.1  INR 1.09 1.07    Studies/Results: Ct Abdomen Pelvis W Contrast  01/29/2016  CLINICAL DATA:  Vomiting blood, bloody stools, diarrhea, perforated appendicitis EXAM: CT ABDOMEN AND PELVIS WITH CONTRAST TECHNIQUE: Multidetector CT imaging of the abdomen and pelvis was performed using the standard protocol following bolus  administration of intravenous contrast. CONTRAST:  ISOVUE-300 IOPAMIDOL (ISOVUE-300) INJECTION 61% COMPARISON:  01/24/2016 FINDINGS: Lung bases are unremarkable. Enhanced liver is unremarkable. Small calcified gallstones within gallbladder again noted the largest measures 4 mm. Enhanced pancreas is unremarkable. Enhanced spleen is unremarkable. There is no evidence of small bowel obstruction. Rectal contrast material was given to the patient prior to scanning. Contrast material noted in right colon and cecum. There is some reflux contrast material within terminal ileum. No extravasation of contrast material is noted. Again noted abnormal mild thickening and enhancement of the tip of the appendix measures at least 8 mm in axial image 71. There is irregularity of anterior wall of the appendix and there is a enhancing fluid collection just anterior to tip of the appendix surrounded by small bowel loops. This collection is best seen in axial images 69 measures 1.5 cm. Again findings are consistent with appendiceal perforation and small periappendiceal abscess. Mild thickening of small bowel wall surrounding this collection probable mild satellite inflammation. Again there is no evidence of extravasation of rectal contrast material. Enhanced kidneys are symmetrical in size. Again noted mild striated nephrogram in midpole lateral aspect and lower pole of the left kidney. Mild focal pyelonephritis cannot be excluded. Delayed renal images shows bilateral renal symmetrical excretion. Bilateral visualized proximal ureter is unremarkable. The uterus is unremarkable. There is a right ovarian cyst measures 2.6 cm. IMPRESSION: 1. Again noted abnormal mild thickening and enhancement of the tip of the appendix measures at least  8 mm in axial image 71. There is irregularity of anterior wall of the appendix and there is a enhancing fluid collection just anterior to tip of the appendix surrounded by small bowel loops. This  collection is best seen in axial images 69 measures 1.5 cm. Again findings are consistent with appendiceal perforation and small periappendiceal abscess. Mild thickening of small bowel wall surrounding this collection probable mild satellite inflammation. Again there is no evidence of extravasation of rectal contrast material. 2. No small bowel obstruction. 3. No hydronephrosis or hydroureter. 4. Again noted focal striated nephrogram mid and lower pole of the left kidney suspicious for mild focal pyelonephritis. Bilateral renal symmetrical excretion. 5. Atherosclerotic calcifications of abdominal aorta and iliac arteries. 6. There is a right ovarian cyst measures 2.6 cm. Electronically Signed   By: Natasha MeadLiviu  Pop M.D.   On: 01/29/2016 16:41       Assessment / Plan:    #1 75 yo [place] female admitted with acute abdominal pain, then subsequent rectal bleeding Ct consistent with appendicitis with perforation and small abscess- she has improved with IV Zosyn x 8 days Because of lower Gi bleeding it is felt colonoscopy indicated preoperatively- #2 chronic anticoagulation - atrial fib   Plan; Discussed with pt in detail via phone interpreter- will schedule for Colonoscopy with Dr Adela LankArmbruster tomorrow am- prep this evening  heparin to be resumed by pharmacy - discussed with pharmacy and they will stop heparin at 3am in anticipation of colonoscopy Tentative plan is for appendectomy  Later this admit   Active Problems:   Hypertension   Hyperlipidemia   Smoker   Appendicitis   Syncope   Generalized abdominal pain   Diarrhea of presumed infectious origin   Lower abdominal pain   PAF (paroxysmal atrial fibrillation) (HCC)     LOS: 7 days   Colburn Asper  01/31/2016, 1:09 PM

## 2016-01-31 NOTE — Progress Notes (Signed)
Patient ID: Linda [name], female   DOB: 15-Jan-1941, 75 y.o.   MRN: [id number]     Linda [name]      [address] [place], [place] [address]    Phone: [phone] FAX: [phone]     Subjective: No diarrhea. No bloody BMs. Wbc down.  H&H are stable.    Objective:  Vital signs:  Filed Vitals:   01/31/16 0800 01/31/16 0900 01/31/16 1000 01/31/16 1122  BP: 132/79 126/78 120/82   Pulse:      Temp:    98.4 F (36.9 C)  TempSrc:    Oral  Resp: '19 29 25   '$ Height:      Weight:      SpO2: 97% 98% 98%     Last BM Date:  (reported to me over the weekend)  Intake/Output   Yesterday:  06/11 0701 - 06/12 0700 In: 2725 [I.V.:2275; IV Piggyback:450] Out: 2275 [Urine:2275] This shift:  Total I/O In: 375 [I.V.:375] Out: -    Physical Exam: General: Pt awake/alert/oriented x4 in no acute distress  Abdomen: Soft. Nondistended. Non tender. No evidence of peritonitis. No incarcerated hernias.   Problem List:   Active Problems:   Hypertension   Hyperlipidemia   Smoker   Appendicitis   Syncope   Generalized abdominal pain   Diarrhea of presumed infectious origin   Lower abdominal pain   PAF (paroxysmal atrial fibrillation) (HCC)    Results:   Labs: Results for orders placed or performed during the hospital encounter of 01/24/16 (from the past 48 hour(s))  Protime-INR     Status: None   Collection Time: 01/30/16  4:33 AM  Result Value Ref Range   Prothrombin Time 14.3 11.6 - 15.2 seconds   INR 1.09 0.00 - 1.49  CBC     Status: Abnormal   Collection Time: 01/30/16  4:33 AM  Result Value Ref Range   WBC 13.9 (H) 4.0 - 10.5 K/uL   RBC 3.24 (L) 3.87 - 5.11 MIL/uL   Hemoglobin 9.3 (L) 12.0 - 15.0 g/dL   HCT 27.3 (L) 36.0 - 46.0 %   MCV 84.3 78.0 - 100.0 fL   MCH 28.7 26.0 - 34.0 pg   MCHC 34.1 30.0 - 36.0 g/dL   RDW 13.7 11.5 - 15.5 %   Platelets 226 150 - 400 K/uL  Basic metabolic panel     Status: Abnormal    Collection Time: 01/30/16  4:33 AM  Result Value Ref Range   Sodium 140 135 - 145 mmol/L   Potassium 3.0 (L) 3.5 - 5.1 mmol/L   Chloride 111 101 - 111 mmol/L   CO2 24 22 - 32 mmol/L   Glucose, Bld 103 (H) 65 - 99 mg/dL   BUN 6 6 - 20 mg/dL   Creatinine, Ser 0.57 0.44 - 1.00 mg/dL   Calcium 7.8 (L) 8.9 - 10.3 mg/dL   GFR calc non Af Amer >60 >60 mL/min   GFR calc Af Amer >60 >60 mL/min    Comment: (NOTE) The eGFR has been calculated using the CKD EPI equation. This calculation has not been validated in all clinical situations. eGFR's persistently <60 mL/min signify possible Chronic Kidney Disease.    Anion gap 5 5 - 15  Magnesium     Status: None   Collection Time: 01/30/16  4:33 AM  Result Value Ref Range   Magnesium 1.9 1.7 - 2.4 mg/dL  Protime-INR     Status: None  Collection Time: 01/31/16  4:52 AM  Result Value Ref Range   Prothrombin Time 14.1 11.6 - 15.2 seconds   INR 1.07 0.00 - 1.49  CBC     Status: Abnormal   Collection Time: 01/31/16  4:52 AM  Result Value Ref Range   WBC 11.4 (H) 4.0 - 10.5 K/uL   RBC 3.89 3.87 - 5.11 MIL/uL   Hemoglobin 11.1 (L) 12.0 - 15.0 g/dL   HCT 32.5 (L) 36.0 - 46.0 %   MCV 83.5 78.0 - 100.0 fL   MCH 28.5 26.0 - 34.0 pg   MCHC 34.2 30.0 - 36.0 g/dL   RDW 13.4 11.5 - 15.5 %   Platelets 308 150 - 400 K/uL  Basic metabolic panel     Status: Abnormal   Collection Time: 01/31/16  4:52 AM  Result Value Ref Range   Sodium 138 135 - 145 mmol/L   Potassium 3.1 (L) 3.5 - 5.1 mmol/L   Chloride 104 101 - 111 mmol/L   CO2 24 22 - 32 mmol/L   Glucose, Bld 129 (H) 65 - 99 mg/dL   BUN <5 (L) 6 - 20 mg/dL   Creatinine, Ser 0.53 0.44 - 1.00 mg/dL   Calcium 8.6 (L) 8.9 - 10.3 mg/dL   GFR calc non Af Amer >60 >60 mL/min   GFR calc Af Amer >60 >60 mL/min    Comment: (NOTE) The eGFR has been calculated using the CKD EPI equation. This calculation has not been validated in all clinical situations. eGFR's persistently <60 mL/min signify possible  Chronic Kidney Disease.    Anion gap 10 5 - 15    Imaging / Studies: Ct Abdomen Pelvis W Contrast  01/29/2016  CLINICAL DATA:  Vomiting blood, bloody stools, diarrhea, perforated appendicitis EXAM: CT ABDOMEN AND PELVIS WITH CONTRAST TECHNIQUE: Multidetector CT imaging of the abdomen and pelvis was performed using the standard protocol following bolus administration of intravenous contrast. CONTRAST:  121m ISOVUE-300 IOPAMIDOL (ISOVUE-300) INJECTION 61% COMPARISON:  01/24/2016 FINDINGS: Lung bases are unremarkable. Enhanced liver is unremarkable. Small calcified gallstones within gallbladder again noted the largest measures 4 mm. Enhanced pancreas is unremarkable. Enhanced spleen is unremarkable. There is no evidence of small bowel obstruction. Rectal contrast material was given to the patient prior to scanning. Contrast material noted in right colon and cecum. There is some reflux contrast material within terminal ileum. No extravasation of contrast material is noted. Again noted abnormal mild thickening and enhancement of the tip of the appendix measures at least 8 mm in axial image 71. There is irregularity of anterior wall of the appendix and there is a enhancing fluid collection just anterior to tip of the appendix surrounded by small bowel loops. This collection is best seen in axial images 69 measures 1.5 cm. Again findings are consistent with appendiceal perforation and small periappendiceal abscess. Mild thickening of small bowel wall surrounding this collection probable mild satellite inflammation. Again there is no evidence of extravasation of rectal contrast material. Enhanced kidneys are symmetrical in size. Again noted mild striated nephrogram in midpole lateral aspect and lower pole of the left kidney. Mild focal pyelonephritis cannot be excluded. Delayed renal images shows bilateral renal symmetrical excretion. Bilateral visualized proximal ureter is unremarkable. The uterus is unremarkable.  There is a right ovarian cyst measures 2.6 cm. IMPRESSION: 1. Again noted abnormal mild thickening and enhancement of the tip of the appendix measures at least 8 mm in axial image 71. There is irregularity of anterior wall of the appendix  and there is a enhancing fluid collection just anterior to tip of the appendix surrounded by small bowel loops. This collection is best seen in axial images 69 measures 1.5 cm. Again findings are consistent with appendiceal perforation and small periappendiceal abscess. Mild thickening of small bowel wall surrounding this collection probable mild satellite inflammation. Again there is no evidence of extravasation of rectal contrast material. 2. No small bowel obstruction. 3. No hydronephrosis or hydroureter. 4. Again noted focal striated nephrogram mid and lower pole of the left kidney suspicious for mild focal pyelonephritis. Bilateral renal symmetrical excretion. 5. Atherosclerotic calcifications of abdominal aorta and iliac arteries. 6. There is a right ovarian cyst measures 2.6 cm. Electronically Signed   By: Lahoma Crocker M.D.   On: 01/29/2016 16:41    Medications / Allergies:  Scheduled Meds: . amoxicillin-clavulanate  1 tablet Oral Q12H  . atorvastatin  20 mg Oral Daily  . diltiazem (CARDIZEM) infusion      . diltiazem  60 mg Oral Q6H  . lisinopril  10 mg Oral Daily  . nicotine  14 mg Transdermal Daily  . [START ON 02/01/2016] pantoprazole  40 mg Intravenous Q12H  . saccharomyces boulardii  250 mg Oral BID  . sodium chloride flush  3 mL Intravenous Q12H   Continuous Infusions: . sodium chloride 50 mL/hr at 01/31/16 1000  . pantoprozole (PROTONIX) infusion 8 mg/hr (01/31/16 1000)   PRN Meds:.acetaminophen **OR** acetaminophen, bisacodyl, hydrALAZINE, HYDROcodone-acetaminophen, magnesium citrate, morphine injection, ondansetron **OR** ondansetron (ZOFRAN) IV, senna-docusate, traZODone  Antibiotics: Anti-infectives    Start     Dose/Rate Route Frequency  Ordered Stop   01/31/16 1130  amoxicillin-clavulanate (AUGMENTIN) 875-125 MG per tablet 1 tablet     1 tablet Oral Every 12 hours 01/31/16 1125     01/26/16 1900  vancomycin (VANCOCIN) 50 mg/mL oral solution 125 mg  Status:  Discontinued     125 mg Oral Every 6 hours 01/26/16 1842 01/27/16 1822   01/24/16 2230  piperacillin-tazobactam (ZOSYN) IVPB 3.375 g  Status:  Discontinued     3.375 g 12.5 mL/hr over 240 Minutes Intravenous Every 8 hours 01/24/16 2200 01/31/16 1125   01/24/16 1500  piperacillin-tazobactam (ZOSYN) IVPB 3.375 g     3.375 g 100 mL/hr over 30 Minutes Intravenous  Once 01/24/16 1451 01/24/16 1538        Assessment/Plan Perforated appendicitis-change to PO antibiotics, plan for interval appendectomy. OP f/u with Dr. Brantley Stage. Spoke with the patient via telephone interpreter ID-augmentin+florastor FEN-appetite improved  ABLA-1u PRBCs 6/10, stable. ?GIB.  Per GI VTE prophylaxis-heparin gtt for hx AF, stopped due to drop in h&h  Erby Pian, [facility] Surgery Pager [phone](7A-[id number]:30P) For consults and floor pages call [phone](7A-4:30P)  01/31/2016 11:27 AM

## 2016-01-31 NOTE — Care Management Important Message (Signed)
Important Message  Patient Details  Name: Linda [name] MRN: [id number] Date of Birth: 17-Nov-1940   Medicare Important Message Given:  Yes    Kyla BalzarineShealy, Nedda Gains Abena 01/31/2016, 10:50 AM

## 2016-01-31 NOTE — Progress Notes (Signed)
Patient Name:  Linda [name], Linda [name]: [birth year], MRN: [id number] Primary Doctor: Nicki Reaper, NP Primary Cardiologist:   Date: 01/31/2016   SUBJECTIVE   The patient noted palpitations when she returned atrial fib last night. IV Cardizem was restarted.   Past Medical History  Diagnosis Date  . Hypertension   . Hyperlipidemia   . Tobacco abuse     Ongoing (3-6 cigarettes per day, 25 pack year history)  . History of CVA (cerebrovascular accident)     New diagnosis December 08, 2010  . Atrial fibrillation (HCC)     dx in setting of stroke 4/12;  echo with EF 60-65%, mild LVH, mild AI, grade 1 diast dysfxn  . Impaired glucose tolerance 05/08/2011  . Hx of cardiovascular stress test     a. Myoview 8/12:  EF 69%, no ischemia.   . Coronary artery disease    Filed Vitals:   01/31/16 0400 01/31/16 0500 01/31/16 0700 01/31/16 0720  BP: 112/63 126/75 108/60   Pulse:      Temp:    98.2 F (36.8 C)  TempSrc:    Oral  Resp: 14 25 16    Height:      Weight:      SpO2: 98% 97% 98%     Intake/Output Summary (Last 24 hours) at 01/31/16 0805 Last data filed at 01/31/16 0515  Gross per 24 hour  Intake   2525 ml  Output   2275 ml  Net    250 ml   Filed Weights   01/24/16 1956  Weight: 122 lb 12.7 oz (55.7 kg)     LABS: Basic Metabolic Panel:  Recent Labs  78/29/56 0433 01/31/16 0452  NA 140 138  K 3.0* 3.1*  CL 111 104  CO2 24 24  GLUCOSE 103* 129*  BUN 6 <5*  CREATININE 0.57 0.53  CALCIUM 7.8* 8.6*  MG 1.9  --    Liver Function Tests: No results for input(s): AST, ALT, ALKPHOS, BILITOT, PROT, ALBUMIN in the last 72 hours. No results for input(s): LIPASE, AMYLASE in the last 72 hours. CBC:  Recent Labs  01/30/16 0433 01/31/16 0452  WBC 13.9* 11.4*  HGB 9.3* 11.1*  HCT 27.3* 32.5*  MCV 84.3 83.5  PLT 226 308   Cardiac Enzymes: No results for input(s): CKTOTAL, CKMB, CKMBINDEX, TROPONINI in the last 72 hours. BNP: Invalid input(s): POCBNP D-Dimer: No  results for input(s): DDIMER in the last 72 hours. Thyroid Function Tests: No results for input(s): TSH, T4TOTAL, T3FREE, THYROIDAB in the last 72 hours.  Invalid input(s): FREET3  RADIOLOGY: X-ray Chest Pa And Lateral  01/25/2016  CLINICAL DATA:  Hypertension. Preoperative cardiovascular evaluation EXAM: CHEST  2 VIEW COMPARISON:  January 24, 2016 FINDINGS: There is persistent minimal left base atelectasis. Lungs elsewhere clear. Heart is borderline enlarged with pulmonary vascularity within normal limits. No adenopathy. No bone lesions. IMPRESSION: Minimal left base atelectasis. No edema or consolidation. Heart borderline prominent but stable. No new opacity. Electronically Signed   By: Bretta Bang III M.D.   On: 01/25/2016 07:31   Mr Pelvis W Wo Contrast  01/27/2016  CLINICAL DATA:  Possible perforated appendicitis. Evaluate prominent pancreatic duct and right adrenal gland nodule and ovarian cysts. EXAM: MRI ABDOMEN AND PELVIS WITHOUT AND WITH CONTRAST TECHNIQUE: Multiplanar multisequence MR imaging of the abdomen and pelvis was performed both before and after the administration of intravenous contrast. CONTRAST:  12 cc MultiHance. COMPARISON:  CT scan 01/24/2016 FINDINGS: COMBINED FINDINGS FOR  BOTH MR ABDOMEN AND PELVIS Examination is quite limited due to respiratory motion and patient motion. Lower chest: The lung bases are grossly clear. No pleural or pericardial effusion. Hepatobiliary: No focal hepatic lesions or intrahepatic biliary dilatation. The gallbladder is mildly distended. No findings for acute cholecystitis. No definite gallstones. No common bile duct dilatation. Pancreas: No mass, inflammation ductal dilatation. Mild atrophy of the pancreatic tail. Spleen:  Normal size.  No focal lesions. Adrenals/Urinary Tract: Small bilateral adrenal gland nodules demonstrate loss of signal intensity on the out of phase T1 weighted gradient echo sequences most consistent with benign adenomas. The  kidneys are unremarkable. Stomach/Bowel: The stomach, duodenum, small bowel and colon are grossly normal. The appendix is not well visualized due to motion artifact. Vascular/Lymphatic: No mesenteric or retroperitoneal mass or abnormal liver aorta is normal in caliber advanced atherosclerotic calcifications. Reproductive: The uterus is grossly normal. There is a simple appearing cyst associated with the right ovary measuring 2.4 cm. Serpiginous fluid collections near the left ovary likely hydrosalpinx. Other: Moderate distention of the bladder is noted. No pelvic mass or adenopathy. Presacral edema is no minimal fluid in the cul-de-sac. Musculoskeletal:  Advanced degenerative changes involving the spine. IMPRESSION: 1. Very limited examination due to patient motion. 2. Small adrenal gland nodules are likely benign adenomas. 3. Suspect mild right lower quadrant inflammatory process but the appendix cystic well visualized. 4. Right ovarian cyst and suspect left-sided hydrosalpinx. 5. Bladder distention. 6. Grossly normal appearance of the pancreaticobiliary tree for age. Electronically Signed   By: Rudie Meyer M.D.   On: 01/27/2016 13:53   Mr Abdomen W Wo Contrast  01/27/2016  CLINICAL DATA:  Possible perforated appendicitis. Evaluate prominent pancreatic duct and right adrenal gland nodule and ovarian cysts. EXAM: MRI ABDOMEN AND PELVIS WITHOUT AND WITH CONTRAST TECHNIQUE: Multiplanar multisequence MR imaging of the abdomen and pelvis was performed both before and after the administration of intravenous contrast. CONTRAST:  12 cc MultiHance. COMPARISON:  CT scan 01/24/2016 FINDINGS: COMBINED FINDINGS FOR BOTH MR ABDOMEN AND PELVIS Examination is quite limited due to respiratory motion and patient motion. Lower chest: The lung bases are grossly clear. No pleural or pericardial effusion. Hepatobiliary: No focal hepatic lesions or intrahepatic biliary dilatation. The gallbladder is mildly distended. No findings for  acute cholecystitis. No definite gallstones. No common bile duct dilatation. Pancreas: No mass, inflammation ductal dilatation. Mild atrophy of the pancreatic tail. Spleen:  Normal size.  No focal lesions. Adrenals/Urinary Tract: Small bilateral adrenal gland nodules demonstrate loss of signal intensity on the out of phase T1 weighted gradient echo sequences most consistent with benign adenomas. The kidneys are unremarkable. Stomach/Bowel: The stomach, duodenum, small bowel and colon are grossly normal. The appendix is not well visualized due to motion artifact. Vascular/Lymphatic: No mesenteric or retroperitoneal mass or abnormal liver aorta is normal in caliber advanced atherosclerotic calcifications. Reproductive: The uterus is grossly normal. There is a simple appearing cyst associated with the right ovary measuring 2.4 cm. Serpiginous fluid collections near the left ovary likely hydrosalpinx. Other: Moderate distention of the bladder is noted. No pelvic mass or adenopathy. Presacral edema is no minimal fluid in the cul-de-sac. Musculoskeletal:  Advanced degenerative changes involving the spine. IMPRESSION: 1. Very limited examination due to patient motion. 2. Small adrenal gland nodules are likely benign adenomas. 3. Suspect mild right lower quadrant inflammatory process but the appendix cystic well visualized. 4. Right ovarian cyst and suspect left-sided hydrosalpinx. 5. Bladder distention. 6. Grossly normal appearance of the pancreaticobiliary tree  for age. Electronically Signed   By: Rudie Meyer M.D.   On: 01/27/2016 13:53   Ct Abdomen Pelvis W Contrast  01/29/2016  CLINICAL DATA:  Vomiting blood, bloody stools, diarrhea, perforated appendicitis EXAM: CT ABDOMEN AND PELVIS WITH CONTRAST TECHNIQUE: Multidetector CT imaging of the abdomen and pelvis was performed using the standard protocol following bolus administration of intravenous contrast. CONTRAST:  ISOVUE-300 IOPAMIDOL (ISOVUE-300) INJECTION  61% COMPARISON:  01/24/2016 FINDINGS: Lung bases are unremarkable. Enhanced liver is unremarkable. Small calcified gallstones within gallbladder again noted the largest measures 4 mm. Enhanced pancreas is unremarkable. Enhanced spleen is unremarkable. There is no evidence of small bowel obstruction. Rectal contrast material was given to the patient prior to scanning. Contrast material noted in right colon and cecum. There is some reflux contrast material within terminal ileum. No extravasation of contrast material is noted. Again noted abnormal mild thickening and enhancement of the tip of the appendix measures at least 8 mm in axial image 71. There is irregularity of anterior wall of the appendix and there is a enhancing fluid collection just anterior to tip of the appendix surrounded by small bowel loops. This collection is best seen in axial images 69 measures 1.5 cm. Again findings are consistent with appendiceal perforation and small periappendiceal abscess. Mild thickening of small bowel wall surrounding this collection probable mild satellite inflammation. Again there is no evidence of extravasation of rectal contrast material. Enhanced kidneys are symmetrical in size. Again noted mild striated nephrogram in midpole lateral aspect and lower pole of the left kidney. Mild focal pyelonephritis cannot be excluded. Delayed renal images shows bilateral renal symmetrical excretion. Bilateral visualized proximal ureter is unremarkable. The uterus is unremarkable. There is a right ovarian cyst measures 2.6 cm. IMPRESSION: 1. Again noted abnormal mild thickening and enhancement of the tip of the appendix measures at least 8 mm in axial image 71. There is irregularity of anterior wall of the appendix and there is a enhancing fluid collection just anterior to tip of the appendix surrounded by small bowel loops. This collection is best seen in axial images 69 measures 1.5 cm. Again findings are consistent with appendiceal  perforation and small periappendiceal abscess. Mild thickening of small bowel wall surrounding this collection probable mild satellite inflammation. Again there is no evidence of extravasation of rectal contrast material. 2. No small bowel obstruction. 3. No hydronephrosis or hydroureter. 4. Again noted focal striated nephrogram mid and lower pole of the left kidney suspicious for mild focal pyelonephritis. Bilateral renal symmetrical excretion. 5. Atherosclerotic calcifications of abdominal aorta and iliac arteries. 6. There is a right ovarian cyst measures 2.6 cm. Electronically Signed   By: Natasha Mead M.D.   On: 01/29/2016 16:41   Ct Abdomen Pelvis W Contrast  01/24/2016  CLINICAL DATA:  Left lower quadrant abdominal pain. EXAM: CT ABDOMEN AND PELVIS WITH CONTRAST TECHNIQUE: Multidetector CT imaging of the abdomen and pelvis was performed using the standard protocol following bolus administration of intravenous contrast. CONTRAST:  ISOVUE-300 IOPAMIDOL (ISOVUE-300) INJECTION 61% COMPARISON:  None. FINDINGS: Normal normal lung bases. The appendix is abnormal in appearance at its distal tip. The proximal 90% of the appendix enhances normally. However, there is decreased enhancement at the distal tip with some adjacent stranding and discontinuity of mucosal enhancement. The distal appendix is also mildly dilated measuring between 8 or 9 mm. There are a few dots of free air adjacent to the distal tip is well. No other free air identified. There is a  small amount of free fluid in the pelvis on the right. Cholelithiasis is identified. The liver, portal veins, and spleen are normal. The left adrenal gland is normal. There is an enhancing nodule in the right adrenal gland measuring 12 mm. Mild prominence of the pancreatic duct with no obstructing mass or cause identified. The common bile duct is normal in caliber. The pancreas is otherwise normal. A few small renal cysts are seen on the left. A few are too small  to characterize. No suspicious renal masses. No ureterectasis or ureteral stones. There are regions of decreased cortical enhancement on the left with no adjacent stranding. This becomes more prominent on delayed images. No filling defects in the upper renal collecting systems. The stomach is normal. There are multiple prominent mildly dilated loops of small bowel with intervening loops of normal caliber small bowel. Given the findings in the right lower quadrant, this is favored to represent developing ileus. The colon is unremarkable. There is a non aneurysmal atherosclerotic aorta. No adenopathy. No adenopathy or suspicious mass in the pelvis. The uterus and right ovary are normal. There appear to be several cysts in the left ovary with the largest measuring up to 2.5 cm. This is asymmetric to the right. No other abnormalities are seen within the pelvis. Delayed images through the upper kidney demonstrate no filling defects in the renal collecting system. IMPRESSION: 1. The findings are consistent with a perforated appendicitis. The appearance is somewhat unusual with lack of enhancement at the distal tip and discontinuity of the mucosa. While this could represent a typical appendicitis, appendicitis due to a mass at the distal tip is not completely excluded. Recommend correlation with pathology. 2. Striated nephrogram on the left with no adjacent stranding. This is an age-indeterminate finding but pyelonephritis is not excluded given the history of left-sided pain. Recommend correlation with urinalysis and clinical exam. 3. 12 mm enhancing nodule in the right adrenal gland. Recommend MRI for further characterization. 4. Probable developing small-bowel ileus. 5. Multiple cysts in the left ovary with the largest measuring up to 2.5 cm. This is a somewhat unusual appearance in a postmenopausal woman. Recommend correlation with ultrasound for better assessment of cyst character and size. Findings called to Dr.  Clydene PughKnott. Electronically Signed   By: Gerome Samavid  Williams III M.D   On: 01/24/2016 14:46   Dg Abd Acute W/chest  01/24/2016  CLINICAL DATA:  RIGHT lower quadrant pain of unspecified duration EXAM: DG ABDOMEN ACUTE W/ 1V CHEST COMPARISON:  Chest radiograph 12/08/2010 FINDINGS: Enlargement of cardiac silhouette. Atherosclerotic calcification aorta. Mediastinal contours and pulmonary vascularity otherwise normal for technique. Suspected RIGHT nipple shadow, with LEFT nipple shadow projecting external to the costal margin. No definite acute infiltrate, pleural effusion or pneumothorax. Minimal subsegmental atelectasis at LEFT base. Prominent small bowel loops in mid abdomen with lesser degrees of colonic gas are seen scattered throughout the colon. No definite bowel wall thickening or free intraperitoneal air. Bones demineralized with degenerative changes lumbar spine. No urinary tract calcification. IMPRESSION: Enlargement of cardiac silhouette with minimal LEFT basilar atelectasis. Air-filled small bowel loops in the mid abdomen prominent in number and size though some gas is seen throughout the colon ; this could represent mild small bowel ileus but early versus partial small-bowel obstruction not completely excluded. Electronically Signed   By: Ulyses SouthwardMark  Boles M.D.   On: 01/24/2016 13:04    PHYSICAL EXAM     Patient is drowsy this morning. She is comfortable flat in bed. Lungs reveal scattered rhonchi.  Cardiac exam reveals S1 and S2. There is no significant peripheral edema.   TELEMETRY: I have personally reviewed telemetry today January 31, 2016. There is atrial fib.The rate is reasonably controlled with IV Cardizem.  ECG:   ASSESSMENT AND PLAN: 1. Atrial Fibrillation with RVR - has a history of PAF. On 01/25/2016, she was noted to have gone into atrial fibrillation with RVR, with HR in the 120's - 140's. She was given IV Cardizem 5mg  and converted to NSR on 01/26/2016 at 0430. Went back into atrial fibrillation  overnight on 01/27/2016. Converted back to NSR 01/29/2016. Reverted to atrial fibrillation on the evening of January 30, 2016 - This patients CHA2DS2-VASc Score and unadjusted Ischemic Stroke Rate (% per year) is equal to 9.7 % stroke rate/year from a score of 6 (CHF, HTN, Female, Age, CVA (2)). Was on Coumadin PTA but had not taken for 20+ days due to running out of the medication and not having transportation to office appointments. Had been on Heparin but this has been stopped due to acute blood loss anemia with a Hbg down to 7.5 plan to restart anticoagulation when it is okay with surgery. Dr Mayford Knife talked with Pharmacy about compliance with Coumadin and INR appointments. Would prefer NOAC for compliance but financial limitations might restrict this decision.  Case Management has been asked to check cost of Xarelto and Eliquis. The patient has once again reverted to atrial fibrillation. I will try to see if we can control her rate with oral diltiazem at this point. -  2. EKG Changes  EKG had shown T-wave changes as his admission. - she denies any recent chest pain or anginal equivalents.  - Echo this admission shows a preserved EF of 60-65% with no wall motion abnormalities. - could consider outpatient NST if she developed anginal symptoms.  3. Chronic Diastolic CHF - echo this admission shows an EF of 60-65% with Grade 2 DD. - continue CCB/ACE I Her volume status is stable at this time.  4. HLD - continue statin therapy.   5. Abdominal Pain/ Possible perforated Appendicitis/ C.diff antigen Positive - Abdominal CT on admission showed findings consistent with a perforated appendicitis, a 12 mm enhancing nodule in the right adrenal gland, probable developing small-bowel ileus, and multiple cysts in the left ovary with the largest measuring up to 2.5 cm.  - Surgery was consulted and noted the CT was concerning for a mass in the cecum.  GI and the primary team continue to assess this  problem.  6. Hypokalemia -  Potassium is 3.1 this morning. The patient will need further potassium repletion.               Willa Rough 01/31/2016 8:05 AM

## 2016-01-31 NOTE — Progress Notes (Addendum)
ANTICOAGULATION CONSULT NOTE - Follow Up Consult  Pharmacy Consult for Heparin  Indication: atrial fibrillation  No Known Allergies  Patient Measurements: Height: 5' (152.4 cm) Weight: 122 lb 12.7 oz (55.7 kg) IBW/kg (Calculated) : 45.5 Vital Signs: Temp: 98.4 F (36.9 C) (06/12 1122) Temp Source: Oral (06/12 1122) BP: 120/82 mmHg (06/12 1000)  Labs:  Recent Labs  01/28/16 1640  01/29/16 0206 01/30/16 0433 01/31/16 0452  HGB  --   < > 7.5* 9.3* 11.1*  HCT  --   --  22.2* 27.3* 32.5*  PLT  --   --   --  226 308  LABPROT  --   --  16.0* 14.3 14.1  INR  --   --  1.27 1.09 1.07  HEPARINUNFRC 0.34  --  0.28*  --   --   CREATININE  --   --   --  0.57 0.53  < > = values in this interval not displayed.  Estimated Creatinine Clearance: 48.3 mL/min (by C-G formula based on Cr of 0.53).   Assessment: 75 y/o F on with afib (CHADS2-VASc = 6), was on IV heparin, but had a GIB on 6/10, heparin has been stopped. Pt received 2 units PRBC, hgb now table 11.1, pltc wnl. Will resume heparin now. Pending colonoscopy then appendectomy.    Goal of Therapy:  Heparin level 0.3-0.7 units/ml Monitor platelets by anticoagulation protocol: Yes   Plan:  - Start heparin 1100 without bolus given recent GIB - 2100 HL - Daily heparin level and CBC - f/u plans for procedures  Bayard HuggerMei Marites Nath, PharmD, BCPS  Clinical Pharmacist  Pager: [phone]   01/31/2016,12:18 PM  Addendum: Discussed with GI (Amy Esterwood PA), plan for colonoscopy tomorrow morning, will stop heparin at 0300 tomorrow, 6 hrs prior to procedure. She also agreed to to switch protonix gtt and IV protonix to po d/t critical IV protonix shortage.   Bayard HuggerMei Roth Ress, PharmD, BCPS  Clinical Pharmacist  Pager: [phone]

## 2016-01-31 NOTE — Progress Notes (Signed)
ANTICOAGULATION CONSULT NOTE - Follow Up Consult  Pharmacy Consult for Heparin  Indication: atrial fibrillation  No Known Allergies  Patient Measurements: Height: 5\' 3"  (160 cm) Weight: 125 lb (56.7 kg) IBW/kg (Calculated) : 52.4 Vital Signs: Temp: 98.4 F (36.9 C) (06/12 2137) Temp Source: Oral (06/12 2137) BP: 146/79 mmHg (06/12 2137) Pulse Rate: 65 (06/12 2137)  Labs:  Recent Labs  01/29/16 0206 01/30/16 0433 01/31/16 0452 01/31/16 2111  HGB 7.5* 9.3* 11.1*  --   HCT 22.2* 27.3* 32.5*  --   PLT  --  226 308  --   LABPROT 16.0* 14.3 14.1  --   INR 1.27 1.09 1.07  --   HEPARINUNFRC 0.28*  --   --  0.63  CREATININE  --  0.57 0.53  --     Estimated Creatinine Clearance: 51 mL/min (by C-G formula based on Cr of 0.53).   Assessment: 75 y/o F on with afib (CHADS2-VASc = 6), was on IV heparin, but had a GIB on 6/10, heparin has been stopped. Pt received 2 units PRBC, hgb now table 11.1, pltc wnl.  Heparin drip resmmed 1100 uts/hr HL 0.63 at higher goal since s/p GIB . Plan for  colonoscopy then appendectomy heparin drip to be turned off at 0300 02/01/16.    Goal of Therapy:  Heparin level 0.3-0.7 units/ml Monitor platelets by anticoagulation protocol: Yes   Plan:  Decrease  heparin 1000 units/hr Plan to turn heparin off at 0300   Addendum: Discussed with GI (Amy Esterwood PA), plan for colonoscopy tomorrow morning, will stop heparin at 0300 tomorrow, 6 hrs prior to procedure. She also agreed to to switch protonix gtt and IV protonix to po d/t critical IV protonix shortage.   Leota SauersLisa Jaylenn Altier Pharm.D. CPP, BCPS Clinical Pharmacist [phone] 01/31/2016 10:14 PM

## 2016-01-31 NOTE — Progress Notes (Signed)
Dr.Tilley on call for cardiology and made aware that patient converted back into Afib. Patient did call me into the room telling me she didn't feel well taking my hand placing it over left chest. Vitals stable at the time .  MD agree to Cardizem gtt overnight and MD will re-evaluate in the morning. I will hold PO Cardizem until off the gtt.

## 2016-01-31 NOTE — Progress Notes (Deleted)
ANTICOAGULATION CONSULT NOTE - Follow Up Consult  Pharmacy Consult for Heparin  Indication: atrial fibrillation  No Known Allergies  Patient Measurements: Height: 5\' 3"  (160 cm) Weight: 125 lb (56.7 kg) IBW/kg (Calculated) : 52.4 Vital Signs: Temp: 98.4 F (36.9 C) (06/12 2137) Temp Source: Oral (06/12 2137) BP: 146/79 mmHg (06/12 2137) Pulse Rate: 65 (06/12 2137)  Labs:  Recent Labs  01/29/16 0206 01/30/16 0433 01/31/16 0452 01/31/16 2111  HGB 7.5* 9.3* 11.1*  --   HCT 22.2* 27.3* 32.5*  --   PLT  --  226 308  --   LABPROT 16.0* 14.3 14.1  --   INR 1.27 1.09 1.07  --   HEPARINUNFRC 0.28*  --   --  0.63  CREATININE  --  0.57 0.53  --     Estimated Creatinine Clearance: 51 mL/min (by C-G formula based on Cr of 0.53).   Assessment: 75 y/o F on with afib (CHADS2-VASc = 6), was on IV heparin, but had a GIB on 6/10. Heparin resumed today and heparin level is at goal. Pending colonoscopy then appendectomy.    Goal of Therapy:  Heparin level 0.3-0.7 units/ml Monitor platelets by anticoagulation protocol: Yes   Plan:   -Decrease heparin to 1000 units/hr to keep towards the lower end of goal - Daily heparin level and CBC -Heparin off at 3am for procedure  Harland Germanndrew Deem Marmol, Pharm D 01/31/2016 10:15 PM

## 2016-02-01 ENCOUNTER — Encounter (HOSPITAL_COMMUNITY)
Admission: EM | Disposition: A | Discharge status: Home / Self Care | Payer: Self-pay | Source: Home / Self Care | Attending: Internal Medicine

## 2016-02-01 ENCOUNTER — Encounter (HOSPITAL_COMMUNITY): Payer: Self-pay

## 2016-02-01 HISTORY — PX: COLONOSCOPY: SHX5424

## 2016-02-01 LAB — HEPARIN LEVEL (UNFRACTIONATED): Heparin Unfractionated: 0.35 IU/mL (ref 0.30–0.70)

## 2016-02-01 LAB — CBC
HEMATOCRIT: 33.8 % — AB (ref 36.0–46.0)
Hemoglobin: 11 g/dL — ABNORMAL LOW (ref 12.0–15.0)
MCH: 27.7 pg (ref 26.0–34.0)
MCHC: 32.5 g/dL (ref 30.0–36.0)
MCV: 85.1 fL (ref 78.0–100.0)
Platelets: 361 10*3/uL (ref 150–400)
RBC: 3.97 MIL/uL (ref 3.87–5.11)
RDW: 13.8 % (ref 11.5–15.5)
WBC: 9.9 10*3/uL (ref 4.0–10.5)

## 2016-02-01 LAB — BASIC METABOLIC PANEL WITH GFR
Anion gap: 8 (ref 5–15)
BUN: 8 mg/dL (ref 6–20)
CO2: 20 mmol/L — ABNORMAL LOW (ref 22–32)
Calcium: 8.9 mg/dL (ref 8.9–10.3)
Chloride: 113 mmol/L — ABNORMAL HIGH (ref 101–111)
Creatinine, Ser: 0.69 mg/dL (ref 0.44–1.00)
GFR calc Af Amer: >60 mL/min
GFR calc non Af Amer: >60 mL/min
Glucose, Bld: 123 mg/dL — ABNORMAL HIGH (ref 65–99)
Potassium: 4.1 mmol/L (ref 3.5–5.1)
Sodium: 141 mmol/L (ref 135–145)

## 2016-02-01 LAB — PROTIME-INR
INR: 1.2 (ref 0.00–1.49)
Prothrombin Time: 15.4 seconds — ABNORMAL HIGH (ref 11.6–15.2)

## 2016-02-01 LAB — GLUCOSE, CAPILLARY: Glucose-Capillary: 127 mg/dL — ABNORMAL HIGH (ref 65–99)

## 2016-02-01 SURGERY — COLONOSCOPY
Anesthesia: Moderate Sedation

## 2016-02-01 MED ORDER — HEPARIN (PORCINE) IN NACL 100-0.45 UNIT/ML-% IJ SOLN
800.0000 [IU]/h | INTRAMUSCULAR | Status: DC
  Administered 2016-02-02 – 2016-02-03 (×2): 1000 [IU]/h via INTRAVENOUS
  Filled 2016-02-01 (×2): qty 250

## 2016-02-01 MED ORDER — MIDAZOLAM HCL 5 MG/5ML IJ SOLN
INTRAMUSCULAR | Status: DC | PRN
  Administered 2016-02-01: 2 mg via INTRAVENOUS
  Administered 2016-02-01: 1 mg via INTRAVENOUS

## 2016-02-01 MED ORDER — FENTANYL CITRATE (PF) 100 MCG/2ML IJ SOLN
INTRAMUSCULAR | Status: AC
  Filled 2016-02-01: qty 2

## 2016-02-01 MED ORDER — MIDAZOLAM HCL 5 MG/ML IJ SOLN
INTRAMUSCULAR | Status: AC
  Filled 2016-02-01: qty 2

## 2016-02-01 MED ORDER — FENTANYL CITRATE (PF) 100 MCG/2ML IJ SOLN
INTRAMUSCULAR | Status: DC | PRN
  Administered 2016-02-01 (×2): 25 ug via INTRAVENOUS

## 2016-02-01 NOTE — Progress Notes (Addendum)
Patient ID: Linda [name], female   DOB: 1941-05-04, 75 y.o.   MRN: [id number]  PROGRESS NOTE    Linda [name]  CVU:[id number] DOB: 11/11/1940 DOA: 01/24/2016  PCP: Webb Silversmith, NP   Brief Narrative:  75 y.o. female with HTN, HLD, Afib on Coumadin, Tobacco/COPD, h/o CVA, brought by EMS from urgent care facility due to loss of consciousness, abd pain with cramps, over 10 episodes of loose BM's. She thought she had food poisoning and was taking imodium but her symptoms have not improved.   In ED, CT abdomen and pelvis notable for questionable  perforated appendicitis. Incidental 12 mm enhancing nodule in the right adrenal gland.Probable developing small-bowel ileus. White count was 16.3. Lactic acid 0.9. Troponin 0.07. She was started on Zosyn and was seen by surgery in consultation.   During this hospital stay she had MRI 01/27/2016 which showed small adrenal gland nodules likely benign, mild right lower quadrant inflammatory process but appendix with visualized. She also had repeat CT abdomen 01/29/2016 which showed abnormal mild thickening and enhancement of the tip of the appendix with enhancing fluid collection surrounded by small bowel loops, consistent with appendiceal perforation and small perippendiceal abscess. Surgery plans on interval appendectomy after colonoscopy. Her Zosyn has been switched to Augmentin on 01/31/2016  Please note we also had a GI consultation for evaluation for colonoscopy.   Cardiology has seen the patient in consultation because of atrial fibrillation. Patient in and out of A. fib and at this time cardiology will continue to manage heart rate with Cardizem by mouth.  Of note, patient had dropped in hemoglobin on 01/29/2016. At that time CT abdomen was repeated to rule out potential GI bleed. Her heparin drip was stopped and she was started on Protonix drip from 6/10 through 6/12. Heparin drip resumed 6/12.   Assessment & Plan:  Acute abdominal pain / appendiceal  perforation  - On admission, CT abdomen and pelvis showed questioanble perforated appendicitis  - MRI 01/27/2016 showed small adrenal gland nodules likely benign, mild right lower quadrant inflammatory process but appendix visualized - Repeat CT abdomen 6/10 showed again noted abnormal mild thickening and enhancement of the tip of the appendix measures at least 8 mm, irregularity of anterior wall of the appendix and enhancing fluid collection anterior to tip of the appendix surrounded by small bowel loops, findings are consistent with appendiceal perforation and small periappendiceal abscess.  - Plan for interval appendectomy after colonoscopy - Colonoscopy planned for today  - Zosyn was changed to Augmentin 01/31/2016. Florastor added.  Sepsis due to possible perforated appendicitis versus left pyelonephritis / Leukocytosis / Diarrhea  - Sepsis criteria met on admission with low grade fever, tachypnea, tachycardia, leukocytosis. No cler source of infection, initially thought appendicitis versus pyelonephritis - Blood and urine culture with no growth so far - Zosyn has been changed to Augmentin 6/12 per surgery  - Off note, patient had stool for C. difficile which was positive for antigen but not toxin which indicates colonization. Stool for C. difficile was repeated and it was negative. Patient already received 1 day of by mouth vancomycin on 01/27/2016.  Metabolic acidosis - Improved as diarrhea resolved   Syncope, chronic diastolic CHF - ECHO notable for stable EF 55 % with grade II diastolic CHF - Compensated   Atrial Fibrillation CHA2DS2-VASc score 6 - Due to non compliance  - In and out of atrial fibrillation requiring Cardizem drip. Last Cardizem drip 01/28/2016 stopped at 7 PM - She is currently on  Cardizem 60 mg every 6 hours - She was off of heparin drip from 01/29/2016 because of drop in hemoglobin from 11.9-7.5. She has received 2 units of PRBC blood transfusion. - Resumed  heparin drip 6/12 and hgb remains stable at 11.0  Acute blood loss anemia - Due to heparin - Heparin drip stopped at 01/29/2016 because of drop in hemoglobin - Hemoglobin stable 6/12 at 1.1 so heparin drip resumed - Hgb 11.0 this am - Monitor daily CBC and monitor for any sign of bleeding  - S/P 2 U PRBC transfusion 6/10  Hypokalemia  - Due to GI losses - Supplemented daily and potassium WNL this am   Essential hypertension - Continue lisinopril 10 mg daily, Cardizem 60 mg Q 8 hours   Hyperlipidemia - Continue atorvastatin 20 mg daily  Adrenal mass, ovarian cyst  -Incidental 12 mm enhancing nodule in the right adrenal gland seen on CT - Based on MRI, likely benign nodules  Tobacco Abuse - Cessation counseling provided    DVT prophylaxis: resumed heparin drip 6/12 Code Status: Full  Family Communication: I called patient's grandson for give update [phone], left message with my cell number to call me back Disposition Plan: not stable for discharge at this point, plan for colonoscopy today    Consultants:   Surgery   GI  Cardiology   Procedures:   2 D ECHO 01/25/2016 - ejection fraction 60% with grade 2 diastolic dysfunction  2 U PRBC transfusion 01/29/2016  Antimicrobials:  Augmentin 01/31/2016 -->  Zosyn 6/5 --> 01/31/2016  Vanco PO 01/27/2016 --> 01/28/2016    Subjective: No overnight events.  Objective: Filed Vitals:   01/31/16 2055 01/31/16 2137 02/01/16 0219 02/01/16 0639  BP: 121/102 146/79 153/62 135/59  Pulse:  65 68 62  Temp:  98.4 F (36.9 C) 98.5 F (36.9 C) 98.2 F (36.8 C)  TempSrc:  Oral Oral   Resp: _0 Height:  _1  (1.6 m)    Weight:  56.7 kg (125 lb)    SpO2: 98% 99% 100% 97%    Intake/Output Summary (Last 24 hours) at 02/01/16 0656 Last data filed at 02/01/16 0117  Gross per 24 hour  Intake    375 ml  Output    700 ml  Net   -325 ml   Filed Weights   01/24/16 1956 01/31/16 2137  Weight: 55.7 kg (122 lb  12.7 oz) 56.7 kg (125 lb)    Examination:  General exam: No acute distress, pt appears calm and comfortable  Respiratory system: No wheezing, no rhonchi  Cardiovascular system: (+) S1, S2, rate controlled  Gastrointestinal system: (+) BS, non tender  Central nervous system: Nonfocal  Extremities: No edema, palpable pulses  Skin: No ulcers or lesions  Psychiatry: Normal mood and behavior   Data Reviewed: I have personally reviewed following labs and imaging studies  CBC:  Recent Labs Lab 01/27/16 0352 01/28/16 0046 01/29/16 0206 01/30/16 0433 01/31/16 0452 02/01/16 0459  WBC 15.1* 15.0*  --  13.9* 11.4* 9.9  HGB 11.4* 11.9* 7.5* 9.3* 11.1* 11.0*  HCT 34.4* 35.2* 22.2* 27.3* 32.5* 33.8*  MCV 85.8 84.4  --  84.3 83.5 85.1  PLT 258 303  --  226 308 935   Basic Metabolic Panel:  Recent Labs Lab 01/25/16 1322 01/26/16 0422 01/27/16 0352 01/28/16 0046 01/30/16 0433 01/31/16 0452 02/01/16 0459  NA  --  134* 134* 134* 140 138 141  K  --  5.1 4.3 4.0 3.0* 3.1*  4.1  CL  --  107 107 107 111 104 113*  CO2  --  17* 18* 15* 24 24 20*  GLUCOSE  --  176* 82 87 103* 129* 123*  BUN  --  <5* 5* 6 6 <5* 8  CREATININE  --  0.56 0.69 0.76 0.57 0.53 0.69  CALCIUM  --  8.4* 8.6* 8.3* 7.8* 8.6* 8.9  MG 1.8 1.7 1.9  --  1.9  --   --    GFR: Estimated Creatinine Clearance: 51 mL/min (by C-G formula based on Cr of 0.69). Liver Function Tests:  Recent Labs Lab 01/26/16 0422  AST 18  ALT 22  ALKPHOS 72  BILITOT 1.2  PROT 6.3*  ALBUMIN 2.7*   No results for input(s): LIPASE, AMYLASE in the last 168 hours. No results for input(s): AMMONIA in the last 168 hours. Coagulation Profile:  Recent Labs Lab 01/28/16 1148 01/29/16 0206 01/30/16 0433 01/31/16 0452 02/01/16 0459  INR 1.13 1.27 1.09 1.07 1.20   Cardiac Enzymes: No results for input(s): CKTOTAL, CKMB, CKMBINDEX, TROPONINI in the last 168 hours. BNP (last 3 results) No results for input(s): PROBNP in the last 8760  hours. HbA1C: No results for input(s): HGBA1C in the last 72 hours. CBG:  Recent Labs Lab 01/29/16 0158 01/31/16 2357  GLUCAP 230* 127*   Lipid Profile: No results for input(s): CHOL, HDL, LDLCALC, TRIG, CHOLHDL, LDLDIRECT in the last 72 hours. Thyroid Function Tests: No results for input(s): TSH, T4TOTAL, FREET4, T3FREE, THYROIDAB in the last 72 hours. Anemia Panel: No results for input(s): VITAMINB12, FOLATE, FERRITIN, TIBC, IRON, RETICCTPCT in the last 72 hours. Urine analysis:    Component Value Date/Time   COLORURINE AMBER* 01/24/2016 1940   APPEARANCEUR CLEAR 01/24/2016 1940   LABSPEC 1.020 01/24/2016 1940   PHURINE 5.5 01/24/2016 1940   GLUCOSEU NEGATIVE 01/24/2016 1940   HGBUR NEGATIVE 01/24/2016 1940   BILIRUBINUR SMALL* 01/24/2016 1940   KETONESUR 15* 01/24/2016 1940   PROTEINUR 30* 01/24/2016 1940   NITRITE NEGATIVE 01/24/2016 1940   LEUKOCYTESUR NEGATIVE 01/24/2016 1940   Sepsis Labs: _0 (procalcitonin:4,lacticidven:4)   Culture, blood (routine x 2)     Status: None (Preliminary result)   Collection Time: 01/24/16  2:54 PM  Result Value Ref Range Status   Specimen Description BLOOD RIGHT ANTECUBITAL  Final   Special Requests BOTTLES DRAWN AEROBIC AND ANAEROBIC 10CC  Final   Culture NO GROWTH 1 DAY  Final   Report Status PENDING  Incomplete  Culture, blood (routine x 2)     Status: None (Preliminary result)   Collection Time: 01/24/16  3:00 PM  Result Value Ref Range Status   Specimen Description BLOOD RIGHT HAND  Final   Special Requests BOTTLES DRAWN AEROBIC ONLY 10CC  Final   Culture NO GROWTH 1 DAY  Final   Report Status PENDING  Incomplete  MRSA PCR Screening     Status: None   Collection Time: 01/24/16  8:00 PM  Result Value Ref Range Status   MRSA by PCR NEGATIVE NEGATIVE Final  C difficile quick scan w PCR reflex     Status: Abnormal   Collection Time: 01/25/16  8:50 PM  Result Value Ref Range Status   C Diff antigen POSITIVE (A)  NEGATIVE Final   C Diff toxin NEGATIVE NEGATIVE Final   C Diff interpretation   Final    C. difficile present, but toxin not detected. This indicates colonization. In most cases, this does not require treatment. If patient has signs  and symptoms consistent with colitis, consider treatment. Requires ENTERIC precautions.      Radiology Studies: Ct Abdomen Pelvis W Contrast 01/29/2016  1. Again noted abnormal mild thickening and enhancement of the tip of the appendix measures at least 8 mm in axial image 71. There is irregularity of anterior wall of the appendix and there is a enhancing fluid collection just anterior to tip of the appendix surrounded by small bowel loops. This collection is best seen in axial images 69 measures 1.5 cm. Again findings are consistent with appendiceal perforation and small periappendiceal abscess. Mild thickening of small bowel wall surrounding this collection probable mild satellite inflammation. Again there is no evidence of extravasation of rectal contrast material. 2. No small bowel obstruction. 3. No hydronephrosis or hydroureter. 4. Again noted focal striated nephrogram mid and lower pole of the left kidney suspicious for mild focal pyelonephritis. Bilateral renal symmetrical excretion. 5. Atherosclerotic calcifications of abdominal aorta and iliac arteries. 6. There is a right ovarian cyst measures 2.6 cm. E  Mr Pelvis W Wo Contrast 01/27/2016  1. Very limited examination due to patient motion. 2. Small adrenal gland nodules are likely benign adenomas. 3. Suspect mild right lower quadrant inflammatory process but the appendix cystic well visualized. 4. Right ovarian cyst and suspect left-sided hydrosalpinx. 5. Bladder distention. 6. Grossly normal appearance of the pancreaticobiliary tree for age. Electronically Signed   By: Marijo Sanes M.D.   On: 01/27/2016 13:53   Mr Abdomen W Wo Contrast 01/27/2016  1. Very limited examination due to patient motion. 2. Small adrenal  gland nodules are likely benign adenomas. 3. Suspect mild right lower quadrant inflammatory process but the appendix cystic well visualized. 4. Right ovarian cyst and suspect left-sided hydrosalpinx. 5. Bladder distention. 6. Grossly normal appearance of the pancreaticobiliary tree for age. Electronically Signed   By: Marijo Sanes M.D.   On: 01/27/2016 13:53   X-ray Chest Pa And Lateral 01/25/2016  Minimal left base atelectasis. No edema or consolidation. Heart borderline prominent but stable. No new opacity. Electronically Signed   By: Lowella Grip III M.D.   On: 01/25/2016 07:31   Ct Abdomen Pelvis W Contrast 01/24/2016  1. The findings are consistent with a perforated appendicitis. The appearance is somewhat unusual with lack of enhancement at the distal tip and discontinuity of the mucosa. While this could represent a typical appendicitis, appendicitis due to a mass at the distal tip is not completely excluded. Recommend correlation with pathology. 2. Striated nephrogram on the left with no adjacent stranding. This is an age-indeterminate finding but pyelonephritis is not excluded given the history of left-sided pain. Recommend correlation with urinalysis and clinical exam. 3. 12 mm enhancing nodule in the right adrenal gland. Recommend MRI for further characterization. 4. Probable developing small-bowel ileus. 5. Multiple cysts in the left ovary with the largest measuring up to 2.5 cm. This is a somewhat unusual appearance in a postmenopausal woman. Recommend correlation with ultrasound for better assessment of cyst character and size. Findings called to Dr. Laneta Simmers. Electronically Signed   By: Dorise Bullion III M.D   On: 01/24/2016 14:46   Dg Abd Acute W/chest              01/24/2016  Enlargement of cardiac silhouette with minimal LEFT basilar atelectasis. Air-filled small bowel loops in the mid abdomen prominent in number and size though some gas is seen throughout the colon ; this could represent  mild small bowel ileus but early versus partial small-bowel  obstruction not completely excluded. Electronically Signed   By: Lavonia Dana M.D.   On: 01/24/2016 13:04     Scheduled Meds: . amoxicillin-clavulanate  1 tablet Oral Q12H  . atorvastatin  20 mg Oral Daily  . diltiazem  60 mg Oral Q6H  . lisinopril  10 mg Oral Daily  . nicotine  14 mg Transdermal Daily  . pantoprazole  40 mg Oral BID  . peg 3350 powder  0.5 kit Oral Once  . saccharomyces boulardii  250 mg Oral BID  . sodium chloride flush  3 mL Intravenous Q12H   Continuous Infusions: . sodium chloride 50 mL/hr at 01/31/16 1219     LOS: 8 days    Time spent: 25 minutes Greater than 50% of the time spent on counseling and coordinating the care.   Leisa Lenz, MD [facility] Pager [phone]  If 7PM-7AM, please contact night-coverage [email or web address] Password [facility] 02/01/2016, 6:56 AM

## 2016-02-01 NOTE — Care Management CHF Note (Signed)
Benefit check completed for Xarelto and Eliquis.   S/W JAMELA @ [employer] # [phone]   ELIQUIS 5 MG 60 TAB (30 )   COVER- YES  CO-PAY- $ 8.25  TIER- 4 DRUG  PRIOR APPROVAL - NO   XARELTO 20 MG 30 TAB (30 )   COVER- YES  CO-PAY- $ 8.25   Q/L  TIER- 3 DRUG  PRIOR APPROVAL - NO   PHARMACY : ANY RETAIL

## 2016-02-01 NOTE — Progress Notes (Signed)
ANTICOAGULATION CONSULT NOTE - Follow Up Consult  Pharmacy Consult for Heparin Indication: atrial fibrillation  No Known Allergies  Patient Measurements: Height: 5\' 3"  (160 cm) Weight: 125 lb (56.7 kg) IBW/kg (Calculated) : 52.4 Heparin Dosing Weight: 56.7 kg  Vital Signs: Temp: 98.7 F (37.1 C) (06/13 1025) Temp Source: Oral (06/13 1025) BP: 125/59 mmHg (06/13 1025) Pulse Rate: 60 (06/13 1025)  Labs:  Recent Labs  01/30/16 0433 01/31/16 0452 01/31/16 2111 02/01/16 0459  HGB 9.3* 11.1*  --  11.0*  HCT 27.3* 32.5*  --  33.8*  PLT 226 308  --  361  LABPROT 14.3 14.1  --  15.4*  INR 1.09 1.07  --  1.20  HEPARINUNFRC  --   --  0.63  --   CREATININE 0.57 0.53  --  0.69    Estimated Creatinine Clearance: 51 mL/min (by C-G formula based on Cr of 0.69).  Assessment:   75 yr old female with afib, on Coumadin in the past, but wasn't taking just prior to admission.  Started on IV heparin on admit 01/26/16, but stopped on 6/10 with GI bleed.  Bleeding resolved, and IV heparin resumed on 6/12.  Last heparin level was 0.63 on 6/12 pm on 1100 units/hr.  Drip was decreased to 1000 units/hr ~11pm on 6/12, then stopped ~3am today for colonoscopy.  Colonoscopy negative.  No bleeding reported.    OK to resume anticoagulation post-colonoscopy per d/w A. Esterwood, PA-C.   OK to resume oral anticoagulation per Surgery, as appendectomy is planned in a few weeks. Cardiology prefers NOAC over Coumadin.  Benefits check in process.     Goal of Therapy:  Heparin level 0.3-0.7 units/ml Monitor platelets by anticoagulation protocol: Yes   Plan:   Resume heparin drip at 1000 units/hr.  Heparin level ~6 hrs after drip resumes.  Daily heparin level and CBC while on heparin.  Will follow-up for transition to oral anticoagulation.  Monitor for any further bleeding.  Dennie Fettersgan, Robby Bulkley Donovan, [place] Pager: [phone] 02/01/2016,1:29 PM

## 2016-02-01 NOTE — Interval H&P Note (Signed)
History and Physical Interval Note:  02/01/2016 8:51 AM  Linda [name]  has presented today for surgery, with the diagnosis of GI bleed  The various methods of treatment have been discussed with the patient and family. After consideration of risks, benefits and other options for treatment, the patient has consented to  Procedure(s): COLONOSCOPY (N/A) as a surgical intervention .  The patient's history has been reviewed, patient examined, no change in status, stable for surgery.  I have reviewed the patient's chart and labs.  Questions were answered to the patient's satisfaction.     Reeves ForthSteven Paul Armbruster

## 2016-02-01 NOTE — Progress Notes (Signed)
Patient ID: Linda [name], female   DOB: 09-26-1940, 75 y.o.   MRN: [id number]     [address] [place], [place] [address]    Phone: [phone] FAX: [phone]     Subjective: No n/v. No pain. H&h stable. Afebrile. VSS.    Objective:  Vital signs:  Filed Vitals:   02/01/16 0952 02/01/16 1000 02/01/16 1010 02/01/16 1025  BP: 120/54 121/49 126/53 125/59  Pulse: 62 59 60 60  Temp:    98.7 F (37.1 C)  TempSrc:    Oral  Resp: 13 0 0 20  Height:      Weight:      SpO2: 99% 99% 99% 98%    Last BM Date: 01/29/16  Intake/Output   Yesterday:  06/12 0701 - 06/13 0700 In: 375 [I.V.:375] Out: 700 [Urine:700] This shift:    I/O last 3 completed shifts: In: 1300 [I.V.:1200; IV Piggyback:100] Out: 1700 [Urine:1700]    Physical Exam: General: Pt awake/alert/oriented x4 in no acute distress  Abdomen: Soft.  Nondistended.  Non distended.  No evidence of peritonitis.  No incarcerated hernias.    Problem List:   Active Problems:   Hypertension   Hyperlipidemia   Smoker   Appendicitis   Syncope   Generalized abdominal pain   Diarrhea of presumed infectious origin   Lower abdominal pain   PAF (paroxysmal atrial fibrillation) (HCC)   Abnormal computed tomography of cecum and terminal ileum   Lower GI bleeding    Results:   Labs: Results for orders placed or performed during the hospital encounter of 01/24/16 (from the past 48 hour(s))  Protime-INR     Status: None   Collection Time: 01/31/16  4:52 AM  Result Value Ref Range   Prothrombin Time 14.1 11.6 - 15.2 seconds   INR 1.07 0.00 - 1.49  CBC     Status: Abnormal   Collection Time: 01/31/16  4:52 AM  Result Value Ref Range   WBC 11.4 (H) 4.0 - 10.5 K/uL   RBC 3.89 3.87 - 5.11 MIL/uL   Hemoglobin 11.1 (L) 12.0 - 15.0 g/dL   HCT 32.5 (L) 36.0 - 46.0 %   MCV 83.5 78.0 - 100.0 fL   MCH 28.5 26.0 - 34.0 pg   MCHC 34.2 30.0 - 36.0 g/dL   RDW  13.4 11.5 - 15.5 %   Platelets 308 150 - 400 K/uL  Basic metabolic panel     Status: Abnormal   Collection Time: 01/31/16  4:52 AM  Result Value Ref Range   Sodium 138 135 - 145 mmol/L   Potassium 3.1 (L) 3.5 - 5.1 mmol/L   Chloride 104 101 - 111 mmol/L   CO2 24 22 - 32 mmol/L   Glucose, Bld 129 (H) 65 - 99 mg/dL   BUN <5 (L) 6 - 20 mg/dL   Creatinine, Ser 0.53 0.44 - 1.00 mg/dL   Calcium 8.6 (L) 8.9 - 10.3 mg/dL   GFR calc non Af Amer >60 >60 mL/min   GFR calc Af Amer >60 >60 mL/min    Comment: (NOTE) The eGFR has been calculated using the CKD EPI equation. This calculation has not been validated in all clinical situations. eGFR's persistently <60 mL/min signify possible Chronic Kidney Disease.    Anion gap 10 5 - 15  Heparin level (unfractionated)     Status: None   Collection Time: 01/31/16  9:11 PM  Result Value  Ref Range   Heparin Unfractionated 0.63 0.30 - 0.70 IU/mL    Comment:        IF HEPARIN RESULTS ARE BELOW EXPECTED VALUES, AND PATIENT DOSAGE HAS BEEN CONFIRMED, SUGGEST FOLLOW UP TESTING OF ANTITHROMBIN III LEVELS.   Glucose, capillary     Status: Abnormal   Collection Time: 01/31/16 11:57 PM  Result Value Ref Range   Glucose-Capillary 127 (H) 65 - 99 mg/dL  Protime-INR     Status: Abnormal   Collection Time: 02/01/16  4:59 AM  Result Value Ref Range   Prothrombin Time 15.4 (H) 11.6 - 15.2 seconds   INR 1.20 0.00 - 1.49  CBC     Status: Abnormal   Collection Time: 02/01/16  4:59 AM  Result Value Ref Range   WBC 9.9 4.0 - 10.5 K/uL   RBC 3.97 3.87 - 5.11 MIL/uL   Hemoglobin 11.0 (L) 12.0 - 15.0 g/dL   HCT 33.8 (L) 36.0 - 46.0 %   MCV 85.1 78.0 - 100.0 fL   MCH 27.7 26.0 - 34.0 pg   MCHC 32.5 30.0 - 36.0 g/dL   RDW 13.8 11.5 - 15.5 %   Platelets 361 150 - 400 K/uL  Basic metabolic panel     Status: Abnormal   Collection Time: 02/01/16  4:59 AM  Result Value Ref Range   Sodium 141 135 - 145 mmol/L   Potassium 4.1 3.5 - 5.1 mmol/L    Comment: DELTA  CHECK NOTED   Chloride 113 (H) 101 - 111 mmol/L   CO2 20 (L) 22 - 32 mmol/L   Glucose, Bld 123 (H) 65 - 99 mg/dL   BUN 8 6 - 20 mg/dL   Creatinine, Ser 0.69 0.44 - 1.00 mg/dL   Calcium 8.9 8.9 - 10.3 mg/dL   GFR calc non Af Amer >60 >60 mL/min   GFR calc Af Amer >60 >60 mL/min    Comment: (NOTE) The eGFR has been calculated using the CKD EPI equation. This calculation has not been validated in all clinical situations. eGFR's persistently <60 mL/min signify possible Chronic Kidney Disease.    Anion gap 8 5 - 15    Imaging / Studies: No results found.  Medications / Allergies:  Scheduled Meds: . amoxicillin-clavulanate  1 tablet Oral Q12H  . atorvastatin  20 mg Oral Daily  . diltiazem  60 mg Oral Q6H  . lisinopril  10 mg Oral Daily  . nicotine  14 mg Transdermal Daily  . pantoprazole  40 mg Oral BID  . peg 3350 powder  0.5 kit Oral Once  . saccharomyces boulardii  250 mg Oral BID  . sodium chloride flush  3 mL Intravenous Q12H   Continuous Infusions: . sodium chloride 50 mL/hr at 01/31/16 1219   PRN Meds:.acetaminophen **OR** acetaminophen, bisacodyl, hydrALAZINE, HYDROcodone-acetaminophen, magnesium citrate, morphine injection, ondansetron **OR** ondansetron (ZOFRAN) IV, senna-docusate, traZODone  Antibiotics: Anti-infectives    Start     Dose/Rate Route Frequency Ordered Stop   01/31/16 1200  amoxicillin-clavulanate (AUGMENTIN) 875-125 MG per tablet 1 tablet     1 tablet Oral Every 12 hours 01/31/16 1125     01/26/16 1900  vancomycin (VANCOCIN) 50 mg/mL oral solution 125 mg  Status:  Discontinued     125 mg Oral Every 6 hours 01/26/16 1842 01/27/16 1822   01/24/16 2230  piperacillin-tazobactam (ZOSYN) IVPB 3.375 g  Status:  Discontinued     3.375 g 12.5 mL/hr over 240 Minutes Intravenous Every 8 hours 01/24/16 2200 01/31/16 1125  01/24/16 1500  piperacillin-tazobactam (ZOSYN) IVPB 3.375 g     3.375 g 100 mL/hr over 30 Minutes Intravenous  Once 01/24/16 1451 01/24/16  1538         Assessment/Plan Perforated appendicitis-total of 14 days of PO antibiotics, follow up has been arranged with Dr. Brantley Stage to discuss interval appendectomy. ID-augmentin+florastor FEN-appetite improved  ABLA-s/p colonoscopy.  H&h stable.  Okay to resume PO anticoagulation from a surgical standpoint.  Dispo-stable for DC from a surgical standpoint    Erby Pian, [facility] Surgery Pager [phone]) For consults and floor pages call [phone](7A-4:30P)  02/01/2016 1:18 PM

## 2016-02-01 NOTE — Op Note (Signed)
[facility] Patient Name: Linda [name] Procedure Date : 02/01/2016 MRN: [id number] Attending MD: Willaim RayasSteven P. Adela LankArmbruster , MD Date of Birth: 01-14-1941 CSN: [id number] Age: [age] Admit Type: Inpatient Procedure:                Colonoscopy Indications:              Evaluation on imaging study of clinically                            significant abnormality, rule out cecal mass Providers:                Willaim RayasSteven P. Adela LankArmbruster, MD, Omelia BlackwaterShelby Carpenter, RN,                            Beryle BeamsJanie Billups, Technician Referring MD:              Medicines:                Fentanyl 25 micrograms IV, Midazolam 3 mg IV Complications:            No immediate complications. Estimated blood loss:                            None. Estimated Blood Loss:     Estimated blood loss: none. Procedure:                Pre-Anesthesia Assessment:                           - Prior to the procedure, a History and Physical                            was performed, and patient medications and                            allergies were reviewed. The patient's tolerance of                            previous anesthesia was also reviewed. The risks                            and benefits of the procedure and the sedation                            options and risks were discussed with the patient.                            All questions were answered, and informed consent                            was obtained. Prior Anticoagulants: The patient has                            taken no previous anticoagulant or antiplatelet  agents. ASA Grade Assessment: III - A patient with                            severe systemic disease. After reviewing the risks                            and benefits, the patient was deemed in                            satisfactory condition to undergo the procedure.                           After obtaining informed consent, the colonoscope                            was  passed under direct vision. Throughout the                            procedure, the patient's blood pressure, pulse, and                            oxygen saturations were monitored continuously. The                            EC-[id number] ([id number]) scope was introduced through                            the anus and advanced to the the terminal ileum,                            with identification of the appendiceal orifice and                            IC valve. The colonoscopy was performed without                            difficulty. The patient tolerated the procedure                            well. The quality of the bowel preparation was                            adequate. The terminal ileum, ileocecal valve,                            appendiceal orifice, and rectum were photographed. Scope In: 9:22:46 AM Scope Out: 9:37:04 AM Scope Withdrawal Time: 0 hours 10 minutes 51 seconds  Total Procedure Duration: 0 hours 14 minutes 18 seconds  Findings:      The perianal and digital rectal examinations were normal.      The terminal ileum appeared normal.      The entire examined colon appeared normal. No mass lesions or polyps.       Specifically, the cecum was normal, there is no cecal mass or  abnormality to account for CT changes, which are otherwise likely due to       perforated appendix which has intervally improved. The AO appeared       normal endoscopically. Further, no pathology was noted to have caused       the patient's previous bleeding symptoms. Impression:               - The examined portion of the ileum was normal.                           - The entire examined colon is normal. Normal cecum Moderate Sedation:      Moderate (conscious) sedation was administered by the endoscopy nurse       and supervised by the endoscopist. The following parameters were       monitored: oxygen saturation, heart rate, blood pressure, and response       to care. Total physician  intraservice time was 25 minutes. Recommendation:           - Return patient to hospital ward for ongoing care.                           - Resume previous diet per surgical team, pending                            timing of surgery                           - Continue present medications including twice                            daily protonix                           - If the patient has any recurrence of bleeding                            symptoms would recommend EGD to clear the upper                            tract.                           - Defer to surgical service for timing of                            appendectomy, cleared for surgery from GI                            perspective, no other pathology otherwise noted on                            this exam to account for CT findings Procedure Code(s):        --- Professional ---                           [phone], Colonoscopy, flexible; diagnostic, including  collection of specimen(s) by brushing or washing,                            when performed (separate procedure)                           99152, Moderate sedation services provided by the                            same physician or other qualified health care                            professional performing the diagnostic or                            therapeutic service that the sedation supports,                            requiring the presence of an independent trained                            observer to assist in the monitoring of the                            patient's level of consciousness and physiological                            status; initial 15 minutes of intraservice time,                            patient age 65 years or older                           [phone], Moderate sedation services; each additional                            15 minutes intraservice time Diagnosis Code(s):        --- Professional ---                            R93.3, Abnormal findings on diagnostic imaging of                            other parts of digestive tract CPT copyright 2016 [employer]. All rights reserved. The codes documented in this report are preliminary and upon coder review may  be revised to meet current compliance requirements. Viviann Spare P. Farris Geiman, MD 02/01/2016 9:52:08 AM This report has been signed electronically. Number of Addenda: 0

## 2016-02-01 NOTE — H&P (View-Only) (Signed)
  Progress Note   Subjective  Eating regular lunch, no further bleeding per nurse, and hgb stable post transfusions over weekend Spoke with pt via interpreter- no c/o abdominal pain, feels better Iv heparin to be restarted hgb 11.1   Objective   Vital signs in last 24 hours: Temp:  [98.1 F (36.7 C)-98.8 F (37.1 C)] 98.4 F (36.9 C) (06/12 1122) Resp:  [14-29] 21 (06/12 1100) BP: (104-148)/(59-82) 104/68 mmHg (06/12 1100) SpO2:  [96 %-99 %] 97 % (06/12 1100) Last BM Date:  (reported to me over the weekend) General:   Asian female in NAD Heart:  Regular rate and rhythm; no murmurs Lungs: Respirations even and unlabored, lungs CTA bilaterally Abdomen:  Soft, nontender and nondistended. Normal bowel sounds. Extremities:  Without edema. Neurologic:  Alert and oriented,  grossly normal neurologically. Psych:  Cooperative. Normal mood and affect.  Intake/Output from previous day: 06/11 0701 - 06/12 0700 In: 2725 [I.V.:2275; IV Piggyback:450] Out: 2275 [Urine:2275] Intake/Output this shift: Total I/O In: 375 [I.V.:375] Out: -   Lab Results:  Recent Labs  01/29/16 0206 01/30/16 0433 01/31/16 0452  WBC  --  13.9* 11.4*  HGB 7.5* 9.3* 11.1*  HCT 22.2* 27.3* 32.5*  PLT  --  226 308   BMET  Recent Labs  01/30/16 0433 01/31/16 0452  NA 140 138  K 3.0* 3.1*  CL 111 104  CO2 24 24  GLUCOSE 103* 129*  BUN 6 <5*  CREATININE 0.57 0.53  CALCIUM 7.8* 8.6*   LFT No results for input(s): PROT, ALBUMIN, AST, ALT, ALKPHOS, BILITOT, BILIDIR, IBILI in the last 72 hours. PT/INR  Recent Labs  01/30/16 0433 01/31/16 0452  LABPROT 14.3 14.1  INR 1.09 1.07    Studies/Results: Ct Abdomen Pelvis W Contrast  01/29/2016  CLINICAL DATA:  Vomiting blood, bloody stools, diarrhea, perforated appendicitis EXAM: CT ABDOMEN AND PELVIS WITH CONTRAST TECHNIQUE: Multidetector CT imaging of the abdomen and pelvis was performed using the standard protocol following bolus  administration of intravenous contrast. CONTRAST:  100mL ISOVUE-300 IOPAMIDOL (ISOVUE-300) INJECTION 61% COMPARISON:  01/24/2016 FINDINGS: Lung bases are unremarkable. Enhanced liver is unremarkable. Small calcified gallstones within gallbladder again noted the largest measures 4 mm. Enhanced pancreas is unremarkable. Enhanced spleen is unremarkable. There is no evidence of small bowel obstruction. Rectal contrast material was given to the patient prior to scanning. Contrast material noted in right colon and cecum. There is some reflux contrast material within terminal ileum. No extravasation of contrast material is noted. Again noted abnormal mild thickening and enhancement of the tip of the appendix measures at least 8 mm in axial image 71. There is irregularity of anterior wall of the appendix and there is a enhancing fluid collection just anterior to tip of the appendix surrounded by small bowel loops. This collection is best seen in axial images 69 measures 1.5 cm. Again findings are consistent with appendiceal perforation and small periappendiceal abscess. Mild thickening of small bowel wall surrounding this collection probable mild satellite inflammation. Again there is no evidence of extravasation of rectal contrast material. Enhanced kidneys are symmetrical in size. Again noted mild striated nephrogram in midpole lateral aspect and lower pole of the left kidney. Mild focal pyelonephritis cannot be excluded. Delayed renal images shows bilateral renal symmetrical excretion. Bilateral visualized proximal ureter is unremarkable. The uterus is unremarkable. There is a right ovarian cyst measures 2.6 cm. IMPRESSION: 1. Again noted abnormal mild thickening and enhancement of the tip of the appendix measures at least   8 mm in axial image 71. There is irregularity of anterior wall of the appendix and there is a enhancing fluid collection just anterior to tip of the appendix surrounded by small bowel loops. This  collection is best seen in axial images 69 measures 1.5 cm. Again findings are consistent with appendiceal perforation and small periappendiceal abscess. Mild thickening of small bowel wall surrounding this collection probable mild satellite inflammation. Again there is no evidence of extravasation of rectal contrast material. 2. No small bowel obstruction. 3. No hydronephrosis or hydroureter. 4. Again noted focal striated nephrogram mid and lower pole of the left kidney suspicious for mild focal pyelonephritis. Bilateral renal symmetrical excretion. 5. Atherosclerotic calcifications of abdominal aorta and iliac arteries. 6. There is a right ovarian cyst measures 2.6 cm. Electronically Signed   By: Natasha MeadLiviu  Pop M.D.   On: 01/29/2016 16:41       Assessment / Plan:    #1 75 yo [place] female admitted with acute abdominal pain, then subsequent rectal bleeding Ct consistent with appendicitis with perforation and small abscess- she has improved with IV Zosyn x 8 days Because of lower Gi bleeding it is felt colonoscopy indicated preoperatively- #2 chronic anticoagulation - atrial fib   Plan; Discussed with pt in detail via phone interpreter- will schedule for Colonoscopy with Dr Adela LankArmbruster tomorrow am- prep this evening  heparin to be resumed by pharmacy - discussed with pharmacy and they will stop heparin at 3am in anticipation of colonoscopy Tentative plan is for appendectomy  Later this admit   Active Problems:   Hypertension   Hyperlipidemia   Smoker   Appendicitis   Syncope   Generalized abdominal pain   Diarrhea of presumed infectious origin   Lower abdominal pain   PAF (paroxysmal atrial fibrillation) (HCC)     LOS: 7 days   Jerrico Covello  01/31/2016, 1:09 PM

## 2016-02-01 NOTE — Progress Notes (Signed)
1530-spoke with pt regarding negative colonoscopy. She is aware for plans to follow up with Sx per their recommendations. Hyacinth MeekerJennifer Lemmon, PA-C 02/01/16

## 2016-02-01 NOTE — Progress Notes (Signed)
Subjective:  Pt not very communicative, there is a language barrier  Objective:  Temp:  [98.2 F (36.8 C)-98.7 F (37.1 C)] 98.7 F (37.1 C) (06/13 1025) Pulse Rate:  [56-96] 60 (06/13 1025) Resp:  [0-20] 20 (06/13 1025) BP: (118-153)/(49-102) 125/59 mmHg (06/13 1025) SpO2:  [97 %-100 %] 98 % (06/13 1025) Weight:  [125 lb (56.7 kg)] 125 lb (56.7 kg) (06/12 2137) Weight change:   Intake/Output from previous day: 06/12 0701 - 06/13 0700 In: 375 [I.V.:375] Out: 700 [Urine:700]  Intake/Output from this shift:    Physical Exam: General appearance: alert and no distress Neck: no adenopathy, no carotid bruit, no JVD, supple, symmetrical, trachea midline and thyroid not enlarged, symmetric, no tenderness/mass/nodules Lungs: clear to auscultation bilaterally Heart: regular rate and rhythm, S1, S2 normal, no murmur, click, rub or gallop Extremities: extremities normal, atraumatic, no cyanosis or edema  Lab Results: Results for orders placed or performed during the hospital encounter of 01/24/16 (from the past 48 hour(s))  Protime-INR     Status: None   Collection Time: 01/31/16  4:52 AM  Result Value Ref Range   Prothrombin Time 14.1 11.6 - 15.2 seconds   INR 1.07 0.00 - 1.49  CBC     Status: Abnormal   Collection Time: 01/31/16  4:52 AM  Result Value Ref Range   WBC 11.4 (H) 4.0 - 10.5 K/uL   RBC 3.89 3.87 - 5.11 MIL/uL   Hemoglobin 11.1 (L) 12.0 - 15.0 g/dL   HCT 32.5 (L) 36.0 - 46.0 %   MCV 83.5 78.0 - 100.0 fL   MCH 28.5 26.0 - 34.0 pg   MCHC 34.2 30.0 - 36.0 g/dL   RDW 13.4 11.5 - 15.5 %   Platelets 308 150 - 400 K/uL  Basic metabolic panel     Status: Abnormal   Collection Time: 01/31/16  4:52 AM  Result Value Ref Range   Sodium 138 135 - 145 mmol/L   Potassium 3.1 (L) 3.5 - 5.1 mmol/L   Chloride 104 101 - 111 mmol/L   CO2 24 22 - 32 mmol/L   Glucose, Bld 129 (H) 65 - 99 mg/dL   BUN <5 (L) 6 - 20 mg/dL   Creatinine, Ser 0.53 0.44 - 1.00 mg/dL   Calcium  8.6 (L) 8.9 - 10.3 mg/dL   GFR calc non Af Amer >60 >60 mL/min   GFR calc Af Amer >60 >60 mL/min    Comment: (NOTE) The eGFR has been calculated using the CKD EPI equation. This calculation has not been validated in all clinical situations. eGFR's persistently <60 mL/min signify possible Chronic Kidney Disease.    Anion gap 10 5 - 15  Heparin level (unfractionated)     Status: None   Collection Time: 01/31/16  9:11 PM  Result Value Ref Range   Heparin Unfractionated 0.63 0.30 - 0.70 IU/mL    Comment:        IF HEPARIN RESULTS ARE BELOW EXPECTED VALUES, AND PATIENT DOSAGE HAS BEEN CONFIRMED, SUGGEST FOLLOW UP TESTING OF ANTITHROMBIN III LEVELS.   Glucose, capillary     Status: Abnormal   Collection Time: 01/31/16 11:57 PM  Result Value Ref Range   Glucose-Capillary 127 (H) 65 - 99 mg/dL  Protime-INR     Status: Abnormal   Collection Time: 02/01/16  4:59 AM  Result Value Ref Range   Prothrombin Time 15.4 (H) 11.6 - 15.2 seconds   INR 1.20 0.00 - 1.49  CBC  Status: Abnormal   Collection Time: 02/01/16  4:59 AM  Result Value Ref Range   WBC 9.9 4.0 - 10.5 K/uL   RBC 3.97 3.87 - 5.11 MIL/uL   Hemoglobin 11.0 (L) 12.0 - 15.0 g/dL   HCT 33.8 (L) 36.0 - 46.0 %   MCV 85.1 78.0 - 100.0 fL   MCH 27.7 26.0 - 34.0 pg   MCHC 32.5 30.0 - 36.0 g/dL   RDW 13.8 11.5 - 15.5 %   Platelets 361 150 - 400 K/uL  Basic metabolic panel     Status: Abnormal   Collection Time: 02/01/16  4:59 AM  Result Value Ref Range   Sodium 141 135 - 145 mmol/L   Potassium 4.1 3.5 - 5.1 mmol/L    Comment: DELTA CHECK NOTED   Chloride 113 (H) 101 - 111 mmol/L   CO2 20 (L) 22 - 32 mmol/L   Glucose, Bld 123 (H) 65 - 99 mg/dL   BUN 8 6 - 20 mg/dL   Creatinine, Ser 0.69 0.44 - 1.00 mg/dL   Calcium 8.9 8.9 - 10.3 mg/dL   GFR calc non Af Amer >60 >60 mL/min   GFR calc Af Amer >60 >60 mL/min    Comment: (NOTE) The eGFR has been calculated using the CKD EPI equation. This calculation has not been validated  in all clinical situations. eGFR's persistently <60 mL/min signify possible Chronic Kidney Disease.    Anion gap 8 5 - 15    Imaging: Imaging results have been reviewed  Tele- NSR  Assessment/Plan:   1. Active Problems: 2.   Hypertension 3.   Hyperlipidemia 4.   Smoker 5.   Appendicitis 6.   Syncope 7.   Generalized abdominal pain 8.   Diarrhea of presumed infectious origin 9.   Lower abdominal pain 10.   PAF (paroxysmal atrial fibrillation) ([place]) 11.   Abnormal computed tomography of cecum and terminal ileum 12.   Lower GI bleeding 13.   Time Spent Directly with Patient:  20 minutes  Length of Stay:  LOS: 8 days   Pt is in NSR today. Was on coumadin as OP. Colonosopy neg. For appendectomy per Gen Surg/ GI.  2D nl LV fxn. VSS. Exam benign. I'm not sure coumadin is best drug for her . NOAC prob better for compliance but would need pharm and case manager to investigate cost. Cleared for Abd surg. HGB stable 11.   Quay Burow 02/01/2016, 11:35 AM

## 2016-02-01 NOTE — Progress Notes (Signed)
ANTICOAGULATION CONSULT NOTE - Follow Up Consult  Pharmacy Consult for Heparin Indication: atrial fibrillation  No Known Allergies  Patient Measurements: Height: 5\' 3"  (160 cm) Weight: 125 lb (56.7 kg) IBW/kg (Calculated) : 52.4 Heparin Dosing Weight: 56.7 kg  Vital Signs: Temp: 98 F (36.7 C) (06/13 1447) Temp Source: Oral (06/13 1025) BP: 135/62 mmHg (06/13 1447) Pulse Rate: 73 (06/13 1447)  Labs:  Recent Labs  01/30/16 0433 01/31/16 0452 01/31/16 2111 02/01/16 0459 02/01/16 2026  HGB 9.3* 11.1*  --  11.0*  --   HCT 27.3* 32.5*  --  33.8*  --   PLT 226 308  --  361  --   LABPROT 14.3 14.1  --  15.4*  --   INR 1.09 1.07  --  1.20  --   HEPARINUNFRC  --   --  0.63  --  0.35  CREATININE 0.57 0.53  --  0.69  --     Estimated Creatinine Clearance: 51 mL/min (by C-G formula based on Cr of 0.69).  Assessment: [age] YOF with history of Afib, on Coumadin in the past, but wasn't taking just prior to admission.  Started on IV heparin on admit 01/26/16, but stopped on 6/10 with GI bleed. Bleeding resolved, and IV heparin resumed on 6/12. Heparin was held this morning for planned colonoscopy which was negative. No bleeding reported. Post-procedure - heparin was restarted until a decision is made regarding oral anticoagulation (benefits check in process)  A heparin level this evening is therapeutic (HL 0.35 << 0.63, goal of 0.3-0.7). CBC from this AM was stable - no overt s/sx of bleeding noted at this time.      Goal of Therapy:  Heparin level 0.3-0.7 units/ml Monitor platelets by anticoagulation protocol: Yes   Plan:  1. Continue heparin at 1000 units/hr (10 ml/hr) 2. Will follow-up plans to transition to oral anticoagulation 3. Will continue to monitor for any signs/symptoms of bleeding and will follow up with heparin level in the a.m.   Linda PillionElizabeth Ryshawn [name], PharmD, BCPS Clinical Pharmacist Pager: [phone] 02/01/2016 9:13 PM

## 2016-02-02 ENCOUNTER — Encounter (HOSPITAL_COMMUNITY): Payer: Self-pay | Admitting: Gastroenterology

## 2016-02-02 DIAGNOSIS — K36 Other appendicitis: Secondary | ICD-10-CM

## 2016-02-02 LAB — PROTIME-INR
INR: 1.09 (ref 0.00–1.49)
PROTHROMBIN TIME: 14.3 s (ref 11.6–15.2)

## 2016-02-02 LAB — CBC
HCT: 29 % — ABNORMAL LOW (ref 36.0–46.0)
HEMOGLOBIN: 9.5 g/dL — AB (ref 12.0–15.0)
MCH: 28.4 pg (ref 26.0–34.0)
MCHC: 32.8 g/dL (ref 30.0–36.0)
MCV: 86.8 fL (ref 78.0–100.0)
PLATELETS: 360 10*3/uL (ref 150–400)
RBC: 3.34 MIL/uL — AB (ref 3.87–5.11)
RDW: 14 % (ref 11.5–15.5)
WBC: 11 10*3/uL — AB (ref 4.0–10.5)

## 2016-02-02 LAB — TYPE AND SCREEN
ABO/RH(D): B POS
ANTIBODY SCREEN: NEGATIVE
UNIT DIVISION: 0
UNIT DIVISION: 0
UNIT DIVISION: 0
Unit division: 0

## 2016-02-02 LAB — BASIC METABOLIC PANEL
ANION GAP: 5 (ref 5–15)
BUN: 7 mg/dL (ref 6–20)
CALCIUM: 8.2 mg/dL — AB (ref 8.9–10.3)
CO2: 22 mmol/L (ref 22–32)
CREATININE: 0.57 mg/dL (ref 0.44–1.00)
Chloride: 109 mmol/L (ref 101–111)
GFR calc non Af Amer: >60 mL/min (ref >60)
Glucose, Bld: 113 mg/dL — ABNORMAL HIGH (ref 65–99)
Potassium: 3.5 mmol/L (ref 3.5–5.1)
SODIUM: 136 mmol/L (ref 135–145)

## 2016-02-02 LAB — HEPARIN LEVEL (UNFRACTIONATED): Heparin Unfractionated: 0.56 IU/mL (ref 0.30–0.70)

## 2016-02-02 MED ORDER — WARFARIN SODIUM 5 MG PO TABS
5.0000 mg | ORAL_TABLET | Freq: Every day | ORAL | Status: DC
  Administered 2016-02-02: 5 mg via ORAL
  Filled 2016-02-02: qty 1

## 2016-02-02 MED ORDER — DILTIAZEM HCL ER COATED BEADS 240 MG PO CP24
240.0000 mg | ORAL_CAPSULE | Freq: Every day | ORAL | Status: DC
  Administered 2016-02-03: 240 mg via ORAL
  Filled 2016-02-02: qty 1

## 2016-02-02 MED ORDER — WARFARIN - PHARMACIST DOSING INPATIENT: Freq: Every day | Status: DC

## 2016-02-02 NOTE — Progress Notes (Addendum)
ANTICOAGULATION CONSULT NOTE - Follow Up Consult  Pharmacy Consult for Heparin / Coumadin Indication: atrial fibrillation  No Known Allergies  Patient Measurements: Height: 5\' 3"  (160 cm) Weight: 125 lb (56.7 kg) IBW/kg (Calculated) : 52.4 Heparin Dosing Weight: 56.7 kg  Vital Signs: Temp: 98.1 F (36.7 C) (06/14 0504) Temp Source: Oral (06/14 0504) BP: 128/59 mmHg (06/14 0504) Pulse Rate: 58 (06/14 0504)  Labs:  Recent Labs  01/31/16 0452 01/31/16 2111 02/01/16 0459 02/01/16 2026 02/02/16 0536  HGB 11.1*  --  11.0*  --  9.5*  HCT 32.5*  --  33.8*  --  29.0*  PLT 308  --  361  --  360  LABPROT 14.1  --  15.4*  --  14.3  INR 1.07  --  1.20  --  1.09  HEPARINUNFRC  --  0.63  --  0.35 0.56  CREATININE 0.53  --  0.69  --  0.57    Estimated Creatinine Clearance: 51 mL/min (by C-G formula based on Cr of 0.57).  Assessment: 89 YOF with history of Afib, on Coumadin in the past, but wasn't taking just prior to admission.  Started on IV heparin on admit 01/26/16, but stopped on 6/10 with GI bleed. Bleeding resolved, and IV heparin resumed on 6/12. Heparin was held this morning for planned colonoscopy which was negative. No bleeding reported. Post-procedure - heparin was restarted until a decision is made regarding oral anticoagulation (benefits check in process)  Heparin level still therapeutic this AM HgB down to 9.5 PM: Restarting Coumadin      Goal of Therapy:  Heparin level 0.3-0.7 units/ml Monitor platelets by anticoagulation protocol: Yes   Plan:  1. Continue heparin at 1000 units/hr (10 ml/hr) 2. Will follow-up plans to transition to oral anticoagulation 3. Will continue to monitor for any signs/symptoms of bleeding and will follow up with heparin level in the a.m.  4. Coumadin 5 mg po daily at 1800 pm 5. Daily INR  Thank you Okey RegalLisa Satina Jerrell, PharmD [phone] 02/02/2016 8:51 AM

## 2016-02-02 NOTE — Progress Notes (Signed)
Progress Note    Linda [name]  GYB:[id number] DOB: 10/17/1940  DOA: 01/24/2016 PCP: Webb Silversmith, NP    Brief Narrative:   Linda [name] is an 75 y.o. female with HTN, HLD, Afib on Coumadin, Tobacco/COPD, h/o CVA, brought by EMS from urgent care facility due to loss of consciousness, abd pain with cramps, over 10 episodes of loose BM's. She thought she had food poisoning and was taking imodium but her symptoms have not improved.   In ED, CT abdomen and pelvis notable for questionable perforated appendicitis. Incidental 12 mm enhancing nodule in the right adrenal gland.Probable developing small-bowel ileus. White count was 16.3. Lactic acid 0.9. Troponin 0.07. She was started on Zosyn and was seen by surgery in consultation.   During this hospital stay she had MRI 01/27/2016 which showed small adrenal gland nodules likely benign, mild right lower quadrant inflammatory process but appendix with visualized. She also had repeat CT abdomen 01/29/2016 which showed abnormal mild thickening and enhancement of the tip of the appendix with enhancing fluid collection surrounded by small bowel loops, consistent with appendiceal perforation and small perippendiceal abscess. Surgery plans on interval appendectomy after colonoscopy. Her Zosyn has been switched to Augmentin on 01/31/2016  Please note we also had a GI consultation for evaluation for colonoscopy.   Cardiology has seen the patient in consultation because of atrial fibrillation. Patient in and out of A. fib and at this time cardiology will continue to manage heart rate with Cardizem by mouth.  Of note, patient had dropped in hemoglobin on 01/29/2016. At that time CT abdomen was repeated to rule out potential GI bleed. Her heparin drip was stopped and she was started on Protonix drip from 6/10 through 6/12. Heparin drip resumed 6/12.  Assessment/Plan:   Principal problems:  Acute abdominal pain secondary to appendiceal perforation  On  admission, CT abdomen and pelvis showed questioanble perforated appendicitis. MRI 01/27/2016 showed small adrenal gland nodules likely benign, mild right lower quadrant inflammatory process but appendix not well visualized. Repeat CT abdomen 01/29/16 showed again noted abnormal mild thickening and enhancement of the tip of the appendix measuring at least 8 mm, irregularity of anterior wall of the appendix and enhancing fluid collection anterior to tip of the appendix surrounded by small bowel loops, consistent with appendiceal perforation and small periappendiceal abscess. Plan for interval appendectomy after colonoscopy, done 02/01/16, which was negative for cecal masses. Zosyn was changed to Augmentin 01/31/2016. Florastor added. There is no immediate plans for surgery, and surgical team has indicated that she is stable for discharge from a surgical point of view. Will resume oral anticoagulation and discharge home when INR therapeutic.  Sepsis due to possible perforated appendicitis versus left pyelonephritis / Leukocytosis / Diarrhea  Sepsis criteria met on admission with low grade fever, tachypnea, tachycardia, leukocytosis. Source of infection likely from perforated appendicitis versus pyelonephritis. Blood and urine culture with no growth so far. Continue Augmentin. Given diarrhea, the patient was tested for C. difficile, antigen positive but toxin negative, indicative of colonization rather than acute infection.  Active problems:  Metabolic acidosis Improved as diarrhea resolved.   Syncope, chronic diastolic CHF ECHO notable for stable EF 55 % with grade II diastolic CHF. Compensated.   Atrial Fibrillation CHA2DS2-VASc score 6 In and out of atrial fibrillation requiring Cardizem drip. Last Cardizem drip 01/28/2016 stopped at 7 PM. She is currently on Cardizem 60 mg every 6 hours, we'll transition to long-acting Cardizem 02/03/16. She was off of heparin  drip from 01/29/2016 because of drop in  hemoglobin from 11.9-7.5. She has received 2 units of PRBC blood transfusion. Resumed heparin drip 01/31/16 and hgb remains stable at 11.0. Resume Coumadin. Discontinue heparin when INR therapeutic.  Acute blood loss anemia S/P 2 U PRBC transfusion 01/29/16. Hemoglobin remained stable.  Hypokalemia  Due to GI losses. Supplemented daily and potassium remains normal.  Essential hypertension Continue lisinopril 10 mg daily, and Cardizem.   Hyperlipidemia Continue atorvastatin 20 mg daily.  Adrenal mass, ovarian cyst  Incidental 12 mm enhancing nodule in the right adrenal gland seen on CT. Based on MRI, likely benign nodules.  Tobacco Abuse Cessation counseling provided.   Family Communication/Anticipated D/C date and plan/Code Status   DVT prophylaxis: Therapeutic dose heparin. Code Status: Full Code.  Family Communication: No family currently at the bedside. Disposition Plan: Home 02/03/16.   Medical Consultants:    Surgery  Gastroenterology  Cardiology   Procedures:    2 D ECHO 01/25/2016 - ejection fraction 60% with grade 2 diastolic dysfunction  2 U PRBC transfusion 01/29/2016  Anti-Infectives:    Augmentin 01/31/2016 -->  Zosyn 6/5 --> 01/31/2016  Vanco PO 01/27/2016 --> 01/28/2016  Subjective:   Linda [name] denies any nausea, vomiting, or ongoing abdominal pain. Feels well overall.  Objective:    Filed Vitals:   02/01/16 1025 02/01/16 1447 02/01/16 2115 02/02/16 0504  BP: 125/59 135/62 131/52 128/59  Pulse: 60 73 69 58  Temp: 98.7 F (37.1 C) 98 F (36.7 C) 98.7 F (37.1 C) 98.1 F (36.7 C)  TempSrc: Oral   Oral  Resp: '20 20 16 18  '$ Height:      Weight:      SpO2: 98% 99% 98% 100%    Intake/Output Summary (Last 24 hours) at 02/02/16 0843 Last data filed at 02/02/16 0545  Gross per 24 hour  Intake 1608.22 ml  Output      0 ml  Net 1608.22 ml   Filed Weights   01/24/16 1956 01/31/16 2137  Weight: 55.7 kg (122 lb 12.7 oz) 56.7 kg (125 lb)     Exam: General exam: Appears calm and comfortable.  Respiratory system: Clear to auscultation. Respiratory effort normal. Cardiovascular system: HSIR. No JVD,  rubs, gallops or clicks. No murmurs. Gastrointestinal system: Abdomen is nondistended, soft and nontender. No organomegaly or masses felt. Normal bowel sounds heard. Central nervous system: Alert and oriented. No focal neurological deficits. Extremities: No clubbing, edema, or cyanosis. Skin: No rashes, lesions or ulcers Psychiatry: Judgement and insight appear normal. Mood & affect appropriate.   Data Reviewed:   I have personally reviewed following labs and imaging studies:  Labs: Basic Metabolic Panel:  Recent Labs Lab 01/27/16 0352 01/28/16 0046 01/30/16 0433 01/31/16 0452 02/01/16 0459 02/02/16 0536  NA 134* 134* 140 138 141 136  K 4.3 4.0 3.0* 3.1* 4.1 3.5  CL 107 107 111 104 113* 109  CO2 18* 15* 24 24 20* 22  GLUCOSE 82 87 103* 129* 123* 113*  BUN 5* 6 6 <5* 8 7  CREATININE 0.69 0.76 0.57 0.53 0.69 0.57  CALCIUM 8.6* 8.3* 7.8* 8.6* 8.9 8.2*  MG 1.9  --  1.9  --   --   --    GFR Estimated Creatinine Clearance: 51 mL/min (by C-G formula based on Cr of 0.57). Liver Function Tests: No results for input(s): AST, ALT, ALKPHOS, BILITOT, PROT, ALBUMIN in the last 168 hours. No results for input(s): LIPASE, AMYLASE in the last 168 hours.  No results for input(s): AMMONIA in the last 168 hours. Coagulation profile  Recent Labs Lab 01/29/16 0206 01/30/16 0433 01/31/16 0452 02/01/16 0459 02/02/16 0536  INR 1.27 1.09 1.07 1.20 1.09    CBC:  Recent Labs Lab 01/28/16 0046 01/29/16 0206 01/30/16 0433 01/31/16 0452 02/01/16 0459 02/02/16 0536  WBC 15.0*  --  13.9* 11.4* 9.9 11.0*  HGB 11.9* 7.5* 9.3* 11.1* 11.0* 9.5*  HCT 35.2* 22.2* 27.3* 32.5* 33.8* 29.0*  MCV 84.4  --  84.3 83.5 85.1 86.8  PLT 303  --  226 308 361 360   CBG:  Recent Labs Lab 01/29/16 0158 01/31/16 2357  GLUCAP 230* 127*   Urine analysis:    Component Value Date/Time   COLORURINE AMBER* 01/24/2016 1940   APPEARANCEUR CLEAR 01/24/2016 1940   LABSPEC 1.020 01/24/2016 1940   PHURINE 5.5 01/24/2016 1940   GLUCOSEU NEGATIVE 01/24/2016 1940   HGBUR NEGATIVE 01/24/2016 1940   BILIRUBINUR SMALL* 01/24/2016 1940   KETONESUR 15* 01/24/2016 1940   PROTEINUR 30* 01/24/2016 1940   NITRITE NEGATIVE 01/24/2016 1940   LEUKOCYTESUR NEGATIVE 01/24/2016 1940   Microbiology Recent Results (from the past 240 hour(s))  Culture, blood (routine x 2)     Status: None   Collection Time: 01/24/16  2:54 PM  Result Value Ref Range Status   Specimen Description BLOOD RIGHT ANTECUBITAL  Final   Special Requests BOTTLES DRAWN AEROBIC AND ANAEROBIC 10CC  Final   Culture NO GROWTH 5 DAYS  Final   Report Status 01/29/2016 FINAL  Final  Culture, blood (routine x 2)     Status: None   Collection Time: 01/24/16  3:00 PM  Result Value Ref Range Status   Specimen Description BLOOD RIGHT HAND  Final   Special Requests BOTTLES DRAWN AEROBIC ONLY 10CC  Final   Culture NO GROWTH 5 DAYS  Final   Report Status 01/29/2016 FINAL  Final  MRSA PCR Screening     Status: None   Collection Time: 01/24/16  8:00 PM  Result Value Ref Range Status   MRSA by PCR NEGATIVE NEGATIVE Final    Comment:        The GeneXpert MRSA Assay (FDA approved for NASAL specimens only), is one component of a comprehensive MRSA colonization surveillance program. It is not intended to diagnose MRSA infection nor to guide or monitor treatment for MRSA infections.   C difficile quick scan w PCR reflex     Status: Abnormal   Collection Time: 01/25/16  8:50 PM  Result Value Ref Range Status   C Diff antigen POSITIVE (A) NEGATIVE Final   C Diff toxin NEGATIVE NEGATIVE Final   C Diff interpretation   Final    C. difficile present, but toxin not detected. This indicates colonization. In most cases, this does not require treatment. If patient has signs and symptoms  consistent with colitis, consider treatment. Requires ENTERIC precautions.  Urine culture     Status: None   Collection Time: 01/26/16  1:20 AM  Result Value Ref Range Status   Specimen Description URINE, CLEAN CATCH  Final   Special Requests NONE  Final   Culture NO GROWTH  Final   Report Status 01/27/2016 FINAL  Final  C difficile quick scan w PCR reflex     Status: Abnormal   Collection Time: 01/27/16 11:00 AM  Result Value Ref Range Status   C Diff antigen POSITIVE (A) NEGATIVE Final   C Diff toxin NEGATIVE NEGATIVE Final   C Diff interpretation  Final    C. difficile present, but toxin not detected. This indicates colonization. In most cases, this does not require treatment. If patient has signs and symptoms consistent with colitis, consider treatment. Requires ENTERIC precautions.  Clostridium Difficile by PCR     Status: None   Collection Time: 01/27/16 11:00 AM  Result Value Ref Range Status   Toxigenic C Difficile by pcr NEGATIVE NEGATIVE Final    Radiology: No results found.  Medications:   . amoxicillin-clavulanate  1 tablet Oral Q12H  . atorvastatin  20 mg Oral Daily  . diltiazem  60 mg Oral Q6H  . lisinopril  10 mg Oral Daily  . nicotine  14 mg Transdermal Daily  . pantoprazole  40 mg Oral BID  . peg 3350 powder  0.5 kit Oral Once  . saccharomyces boulardii  250 mg Oral BID  . sodium chloride flush  3 mL Intravenous Q12H   Continuous Infusions: . sodium chloride 50 mL/hr at 02/01/16 2343  . heparin 1,000 Units/hr (02/02/16 0005)    Time spent: 25 minutes.   LOS: 9 days   [facility] Pager [phone]. If unable to reach me by pager, please call my cell phone at [phone].  *Please refer to [email or web address], password [email or web address] to get updated schedule on who will round on this patient, as hospitalists switch teams weekly. If 7PM-7AM, please contact night-coverage at [email or web address], password [email or web address] for any overnight needs.  02/02/2016, 8:43 AM

## 2016-02-03 DIAGNOSIS — A419 Sepsis, unspecified organism: Secondary | ICD-10-CM

## 2016-02-03 LAB — PROTIME-INR
INR: 1.13 (ref 0.00–1.49)
Prothrombin Time: 14.7 seconds (ref 11.6–15.2)

## 2016-02-03 LAB — CBC
HEMATOCRIT: 33.5 % — AB (ref 36.0–46.0)
HEMOGLOBIN: 11 g/dL — AB (ref 12.0–15.0)
MCH: 27.9 pg (ref 26.0–34.0)
MCHC: 32.8 g/dL (ref 30.0–36.0)
MCV: 85 fL (ref 78.0–100.0)
Platelets: 484 10*3/uL — ABNORMAL HIGH (ref 150–400)
RBC: 3.94 MIL/uL (ref 3.87–5.11)
RDW: 13.7 % (ref 11.5–15.5)
WBC: 12.9 10*3/uL — AB (ref 4.0–10.5)

## 2016-02-03 LAB — HEPARIN LEVEL (UNFRACTIONATED): HEPARIN UNFRACTIONATED: 0.92 [IU]/mL — AB (ref 0.30–0.70)

## 2016-02-03 MED ORDER — LISINOPRIL 10 MG PO TABS: 10.0000 mg | ORAL_TABLET | Freq: Every day | ORAL | Status: DC

## 2016-02-03 MED ORDER — AMOXICILLIN-POT CLAVULANATE 875-125 MG PO TABS: 1.0000 | ORAL_TABLET | Freq: Two times a day (BID) | ORAL | Status: DC

## 2016-02-03 MED ORDER — DILTIAZEM HCL ER COATED BEADS 240 MG PO CP24: 240.0000 mg | ORAL_CAPSULE | Freq: Every day | ORAL | Status: DC

## 2016-02-03 MED ORDER — PANTOPRAZOLE SODIUM 40 MG PO TBEC: 40.0000 mg | DELAYED_RELEASE_TABLET | Freq: Every day | ORAL | Status: DC

## 2016-02-03 MED ORDER — RIVAROXABAN 20 MG PO TABS: 20.0000 mg | ORAL_TABLET | Freq: Every day | ORAL | Status: DC

## 2016-02-03 MED ORDER — RIVAROXABAN 20 MG PO TABS
20.0000 mg | ORAL_TABLET | Freq: Every day | ORAL | Status: DC
  Administered 2016-02-03: 20 mg via ORAL
  Filled 2016-02-03: qty 1

## 2016-02-03 NOTE — Discharge Summary (Signed)
Physician Discharge Summary  JAQUELYNE FIRKUS WJX:[id number] DOB: Feb 12, 1941 DOA: 01/24/2016  PCP: Nicki Reaper, NP  Admit date: 01/24/2016 Discharge date: 02/03/2016   Recommendations for Outpatient Follow-Up:   1. Patient will follow-up with general surgery for possible appendectomy.   Discharge Diagnosis:   Principal Problem:    Sepsis (HCC) secondary to ruptured appendix Active Problems:    Hypertension    Hyperlipidemia    Smoker    Appendicitis    Syncope    Generalized abdominal pain    Diarrhea of presumed infectious origin    Lower abdominal pain    PAF (paroxysmal atrial fibrillation) (HCC)    Abnormal computed tomography of cecum and terminal ileum    Lower GI bleeding    Acute blood loss anemia status post 2 units of packed red blood cells   Discharge disposition:  Home.    Discharge Condition: Improved.  Diet recommendation: Low sodium, heart healthy.    Wound care: None.   History of Present Illness:   Linda [name] is an 75 y.o. female with HTN, HLD, Afib on Coumadin, Tobacco/COPD, h/o CVA, brought by EMS from urgent care facility due to loss of consciousness, abd pain with cramps, over 10 episodes of loose BM's. She thought she had food poisoning and was taking imodium but her symptoms have not improved.   In ED, CT abdomen and pelvis notable for questionable perforated appendicitis. Incidental 12 mm enhancing nodule in the right adrenal gland.Probable developing small-bowel ileus. White count was 16.3. Lactic acid 0.9. Troponin 0.07. She was started on Zosyn and was seen by surgery in consultation.   During this hospital stay she had MRI 01/27/2016 which showed small adrenal gland nodules likely benign, mild right lower quadrant inflammatory process but appendix with visualized. She also had repeat CT abdomen 01/29/2016 which showed abnormal mild thickening and enhancement of the tip of the appendix with enhancing fluid collection surrounded by  small bowel loops, consistent with appendiceal perforation and small perippendiceal abscess. Surgery plans on interval appendectomy after colonoscopy. Her Zosyn has been switched to Augmentin on 01/31/2016.  Cardiology has seen the patient in consultation because of atrial fibrillation. Patient in and out of A. fib and at this time cardiology will continue to manage heart rate with Cardizem by mouth.  Of note, patient had dropped in hemoglobin on 01/29/2016. At that time CT abdomen was repeated to rule out potential GI bleed. Her heparin drip was stopped and she was started on Protonix drip from 6/10 through 6/12. Heparin drip resumed 6/12.   Hospital Course by Problem:   Principal problems:  Sepsis and acute abdominal pain secondary to appendiceal perforation  On admission, CT abdomen and pelvis showed questionable perforated appendicitis. MRI 01/27/2016 showed small adrenal gland nodules likely benign, mild right lower quadrant inflammatory process but appendix not well visualized. Repeat CT abdomen 01/29/16 showed again noted abnormal mild thickening and enhancement of the tip of the appendix measuring at least 8 mm, irregularity of anterior wall of the appendix and enhancing fluid collection anterior to tip of the appendix surrounded by small bowel loops, consistent with appendiceal perforation and small periappendiceal abscess. Plan for interval appendectomy after colonoscopy, done 02/01/16, which was negative for cecal masses. Zosyn was changed to Augmentin 01/31/2016. Blood cultures have remained negative. Florastor added. There is no immediate plans for surgery, and surgical team has indicated that she is stable for discharge from a surgical point of view. Will resume oral anticoagulation with Xarelto and D/C  home with surgical follow up.  Active problems:  Metabolic acidosis Improved as diarrhea resolved.   Syncope, chronic diastolic CHF ECHO notable for stable EF 55 % with grade II  diastolic CHF. Compensated.   Atrial Fibrillation CHA2DS2-VASc score 6 In and out of atrial fibrillation requiring Cardizem drip. Last Cardizem drip 01/28/2016 stopped at 7 PM. Transitioned to long-acting Cardizem 02/03/16. She was off of heparin drip from 01/29/2016 because of drop in hemoglobin from 11.9-7.5. She has received 2 units of PRBC blood transfusion. Resumed heparin drip 01/31/16 and hgb remains stable at 11.0. D/C home on Xarelto.  Acute blood loss anemia S/P 2 U PRBC transfusion 01/29/16. Hemoglobin remained stable.  Hypokalemia  Due to GI losses. Supplemented daily and potassium remains normal.  Essential hypertension Continue lisinopril 10 mg daily, and Cardizem.   Hyperlipidemia Continue atorvastatin 20 mg daily.  Adrenal mass, ovarian cyst  Incidental 12 mm enhancing nodule in the right adrenal gland seen on CT. Based on MRI, likely benign nodules.  Tobacco Abuse Cessation counseling provided.     Medical Consultants:    Surgery  Gastroenterology  Cardiology   Discharge Exam:   Filed Vitals:   02/02/16 2212 02/03/16 0537  BP: 131/66 112/54  Pulse: 76 71  Temp: 98.2 F (36.8 C) 98.1 F (36.7 C)  Resp: 18 18   Filed Vitals:   02/01/16 2115 02/02/16 0504 02/02/16 2212 02/03/16 0537  BP: 131/52 128/59 131/66 112/54  Pulse: 69 58 76 71  Temp: 98.7 F (37.1 C) 98.1 F (36.7 C) 98.2 F (36.8 C) 98.1 F (36.7 C)  TempSrc:  Oral    Resp: 16 18 18 18   Height:      Weight:      SpO2: 98% 100% 98% 100%    Gen:  NAD Cardiovascular:  RRR, No M/R/G Respiratory: Lungs CTAB Gastrointestinal: Abdomen soft, NT/ND with normal active bowel sounds. Extremities: No C/E/C   The results of significant diagnostics from this hospitalization (including imaging, microbiology, ancillary and laboratory) are listed below for reference.     Procedures and Diagnostic Studies:   X-ray Chest Pa And Lateral  01/25/2016  CLINICAL DATA:  Hypertension. Preoperative  cardiovascular evaluation EXAM: CHEST  2 VIEW COMPARISON:  January 24, 2016 FINDINGS: There is persistent minimal left base atelectasis. Lungs elsewhere clear. Heart is borderline enlarged with pulmonary vascularity within normal limits. No adenopathy. No bone lesions. IMPRESSION: Minimal left base atelectasis. No edema or consolidation. Heart borderline prominent but stable. No new opacity. Electronically Signed   By: Bretta Bang III M.D.   On: 01/25/2016 07:31   Ct Abdomen Pelvis W Contrast  01/24/2016  CLINICAL DATA:  Left lower quadrant abdominal pain. EXAM: CT ABDOMEN AND PELVIS WITH CONTRAST TECHNIQUE: Multidetector CT imaging of the abdomen and pelvis was performed using the standard protocol following bolus administration of intravenous contrast. CONTRAST:  ISOVUE-300 IOPAMIDOL (ISOVUE-300) INJECTION 61% COMPARISON:  None. FINDINGS: Normal normal lung bases. The appendix is abnormal in appearance at its distal tip. The proximal 90% of the appendix enhances normally. However, there is decreased enhancement at the distal tip with some adjacent stranding and discontinuity of mucosal enhancement. The distal appendix is also mildly dilated measuring between 8 or 9 mm. There are a few dots of free air adjacent to the distal tip is well. No other free air identified. There is a small amount of free fluid in the pelvis on the right. Cholelithiasis is identified. The liver, portal veins, and spleen are normal. The  left adrenal gland is normal. There is an enhancing nodule in the right adrenal gland measuring 12 mm. Mild prominence of the pancreatic duct with no obstructing mass or cause identified. The common bile duct is normal in caliber. The pancreas is otherwise normal. A few small renal cysts are seen on the left. A few are too small to characterize. No suspicious renal masses. No ureterectasis or ureteral stones. There are regions of decreased cortical enhancement on the left with no adjacent  stranding. This becomes more prominent on delayed images. No filling defects in the upper renal collecting systems. The stomach is normal. There are multiple prominent mildly dilated loops of small bowel with intervening loops of normal caliber small bowel. Given the findings in the right lower quadrant, this is favored to represent developing ileus. The colon is unremarkable. There is a non aneurysmal atherosclerotic aorta. No adenopathy. No adenopathy or suspicious mass in the pelvis. The uterus and right ovary are normal. There appear to be several cysts in the left ovary with the largest measuring up to 2.5 cm. This is asymmetric to the right. No other abnormalities are seen within the pelvis. Delayed images through the upper kidney demonstrate no filling defects in the renal collecting system. IMPRESSION: 1. The findings are consistent with a perforated appendicitis. The appearance is somewhat unusual with lack of enhancement at the distal tip and discontinuity of the mucosa. While this could represent a typical appendicitis, appendicitis due to a mass at the distal tip is not completely excluded. Recommend correlation with pathology. 2. Striated nephrogram on the left with no adjacent stranding. This is an age-indeterminate finding but pyelonephritis is not excluded given the history of left-sided pain. Recommend correlation with urinalysis and clinical exam. 3. 12 mm enhancing nodule in the right adrenal gland. Recommend MRI for further characterization. 4. Probable developing small-bowel ileus. 5. Multiple cysts in the left ovary with the largest measuring up to 2.5 cm. This is a somewhat unusual appearance in a postmenopausal woman. Recommend correlation with ultrasound for better assessment of cyst character and size. Findings called to Dr. Clydene Pugh. Electronically Signed   By: Gerome Sam III M.D   On: 01/24/2016 14:46   Dg Abd Acute W/chest  01/24/2016  CLINICAL DATA:  RIGHT lower quadrant pain of  unspecified duration EXAM: DG ABDOMEN ACUTE W/ 1V CHEST COMPARISON:  Chest radiograph 12/08/2010 FINDINGS: Enlargement of cardiac silhouette. Atherosclerotic calcification aorta. Mediastinal contours and pulmonary vascularity otherwise normal for technique. Suspected RIGHT nipple shadow, with LEFT nipple shadow projecting external to the costal margin. No definite acute infiltrate, pleural effusion or pneumothorax. Minimal subsegmental atelectasis at LEFT base. Prominent small bowel loops in mid abdomen with lesser degrees of colonic gas are seen scattered throughout the colon. No definite bowel wall thickening or free intraperitoneal air. Bones demineralized with degenerative changes lumbar spine. No urinary tract calcification. IMPRESSION: Enlargement of cardiac silhouette with minimal LEFT basilar atelectasis. Air-filled small bowel loops in the mid abdomen prominent in number and size though some gas is seen throughout the colon ; this could represent mild small bowel ileus but early versus partial small-bowel obstruction not completely excluded. Electronically Signed   By: Ulyses Southward M.D.   On: 01/24/2016 13:04    2 D ECHO 01/25/2016 - ejection fraction 60% with grade 2 diastolic dysfunction  Labs:   Basic Metabolic Panel:  Recent Labs Lab 01/28/16 0046 01/30/16 0433 01/31/16 0452 02/01/16 0459 02/02/16 0536  NA 134* 140 138 141 136  K 4.0  3.0* 3.1* 4.1 3.5  CL 107 111 104 113* 109  CO2 15* 24 24 20* 22  GLUCOSE 87 103* 129* 123* 113*  BUN 6 6 <5* 8 7  CREATININE 0.76 0.57 0.53 0.69 0.57  CALCIUM 8.3* 7.8* 8.6* 8.9 8.2*  MG  --  1.9  --   --   --    GFR Estimated Creatinine Clearance: 51 mL/min (by C-G formula based on Cr of 0.57). Liver Function Tests: No results for input(s): AST, ALT, ALKPHOS, BILITOT, PROT, ALBUMIN in the last 168 hours. No results for input(s): LIPASE, AMYLASE in the last 168 hours. No results for input(s): AMMONIA in the last 168 hours. Coagulation  profile  Recent Labs Lab 01/30/16 0433 01/31/16 0452 02/01/16 0459 02/02/16 0536 02/03/16 0607  INR 1.09 1.07 1.20 1.09 1.13    CBC:  Recent Labs Lab 01/30/16 0433 01/31/16 0452 02/01/16 0459 02/02/16 0536 02/03/16 0607  WBC 13.9* 11.4* 9.9 11.0* 12.9*  HGB 9.3* 11.1* 11.0* 9.5* 11.0*  HCT 27.3* 32.5* 33.8* 29.0* 33.5*  MCV 84.3 83.5 85.1 86.8 85.0  PLT 226 308 361 360 484*   CBG:  Recent Labs Lab 01/29/16 0158 01/31/16 2357  GLUCAP 230* 127*   Microbiology Recent Results (from the past 240 hour(s))  MRSA PCR Screening     Status: None   Collection Time: 01/24/16  8:00 PM  Result Value Ref Range Status   MRSA by PCR NEGATIVE NEGATIVE Final    Comment:        The GeneXpert MRSA Assay (FDA approved for NASAL specimens only), is one component of a comprehensive MRSA colonization surveillance program. It is not intended to diagnose MRSA infection nor to guide or monitor treatment for MRSA infections.   C difficile quick scan w PCR reflex     Status: Abnormal   Collection Time: 01/25/16  8:50 PM  Result Value Ref Range Status   C Diff antigen POSITIVE (A) NEGATIVE Final   C Diff toxin NEGATIVE NEGATIVE Final   C Diff interpretation   Final    C. difficile present, but toxin not detected. This indicates colonization. In most cases, this does not require treatment. If patient has signs and symptoms consistent with colitis, consider treatment. Requires ENTERIC precautions.  Urine culture     Status: None   Collection Time: 01/26/16  1:20 AM  Result Value Ref Range Status   Specimen Description URINE, CLEAN CATCH  Final   Special Requests NONE  Final   Culture NO GROWTH  Final   Report Status 01/27/2016 FINAL  Final  C difficile quick scan w PCR reflex     Status: Abnormal   Collection Time: 01/27/16 11:00 AM  Result Value Ref Range Status   C Diff antigen POSITIVE (A) NEGATIVE Final   C Diff toxin NEGATIVE NEGATIVE Final   C Diff interpretation   Final     C. difficile present, but toxin not detected. This indicates colonization. In most cases, this does not require treatment. If patient has signs and symptoms consistent with colitis, consider treatment. Requires ENTERIC precautions.  Clostridium Difficile by PCR     Status: None   Collection Time: 01/27/16 11:00 AM  Result Value Ref Range Status   Toxigenic C Difficile by pcr NEGATIVE NEGATIVE Final     Discharge Instructions:       Discharge Instructions    Call MD for:  extreme fatigue    Complete by:  As directed      Call  MD for:  persistant nausea and vomiting    Complete by:  As directed      Call MD for:  severe uncontrolled pain    Complete by:  As directed      Call MD for:  temperature >100.4    Complete by:  As directed      Call MD for:    Complete by:  As directed   Bleeding problems.     Diet - low sodium heart healthy    Complete by:  As directed      Discharge instructions    Complete by:  As directed   You will finish up your antibiotics in 4 more days.  The surgery team will see you as an outpatient to determine if/when you need surgery.  Instead of Coumadin, you will take Xarelto as your blood thinner.  This medicine does not need to be monitored the same way as coumadin does.     Increase activity slowly    Complete by:  As directed             Medication List    STOP taking these medications        amLODipine 10 MG tablet  Commonly known as:  NORVASC     warfarin 5 MG tablet  Commonly known as:  COUMADIN      TAKE these medications        amoxicillin-clavulanate 875-125 MG tablet  Commonly known as:  AUGMENTIN  Take 1 tablet by mouth every 12 (twelve) hours.     atorvastatin 20 MG tablet  Commonly known as:  LIPITOR  TAKE 1 TABLET BY MOUTH EVERY DAY     diltiazem 240 MG 24 hr capsule  Commonly known as:  CARDIZEM CD  Take 1 capsule (240 mg total) by mouth daily.     ibuprofen 200 MG tablet  Commonly known as:  ADVIL,MOTRIN  Take  200-400 mg by mouth every 6 (six) hours as needed for headache or mild pain.     lisinopril 10 MG tablet  Commonly known as:  PRINIVIL,ZESTRIL  Take 1 tablet (10 mg total) by mouth daily.     pantoprazole 40 MG tablet  Commonly known as:  PROTONIX  Take 1 tablet (40 mg total) by mouth daily.     rivaroxaban 20 MG Tabs tablet  Commonly known as:  XARELTO  Take 1 tablet (20 mg total) by mouth daily.       Follow-up Information    Follow up with Harriette Bouillon A., MD On 02/18/2016.   Specialty:  General Surgery   Why:  arrive by 11:45AM for a 12:15 visit with the surgeon to discuss removing your appendix   Contact information:   [address] [place] [address] [phone]       Schedule an appointment as soon as possible for a visit with Nicki Reaper, NP.   Specialty:  Internal Medicine   Why:  If symptoms worsen   Contact information:   [address] [place] [address] [phone]        Time coordinating discharge: 35 minutes.  Signed:  RAMA,CHRISTINA  Pager [phone] [facility] 02/03/2016, 3:26 PM

## 2016-02-03 NOTE — Progress Notes (Signed)
ANTICOAGULATION CONSULT NOTE   Pharmacy Consult for Heparin Indication: atrial fibrillation  No Known Allergies  Patient Measurements: Height: 5\' 3"  (160 cm) Weight: 125 lb (56.7 kg) IBW/kg (Calculated) : 52.4 Heparin Dosing Weight: 56.7 kg  Vital Signs: Temp: 98.1 F (36.7 C) (06/15 0537) BP: 112/54 mmHg (06/15 0537) Pulse Rate: 71 (06/15 0537)  Labs:  Recent Labs  02/01/16 0459 02/01/16 2026 02/02/16 0536 02/03/16 0607  HGB 11.0*  --  9.5* 11.0*  HCT 33.8*  --  29.0* 33.5*  PLT 361  --  360 484*  LABPROT 15.4*  --  14.3 14.7  INR 1.20  --  1.09 1.13  HEPARINUNFRC  --  0.35 0.56 0.92*  CREATININE 0.69  --  0.57  --     Estimated Creatinine Clearance: 51 mL/min (by C-G formula based on Cr of 0.57).  Assessment: 75 yo female with Afib for anticoagulation     Goal of Therapy:  INR 2-3  Heparin level 0.3-0.7 units/ml Monitor platelets by anticoagulation protocol: Yes   Plan:  Decrease Heparin 800 units/hr Check heparin level in 8 hours.  Geannie RisenGreg Kaci Freel, PharmD, BCPS  02/03/2016 7:42 AM

## 2016-02-03 NOTE — Progress Notes (Signed)
NURSING PROGRESS NOTE  Linda PineDoan T [name] [id number] Discharge Data: 02/03/2016 5:12 PM Attending Provider: No att. providers found WUJ:[id number]:[name], Linda KocherEGINA, NP     Linda Pineoan T Hoots to be D/C'd Home per MD order.  Discussed with the patient the After Visit Summary and all questions fully answered. All IV's discontinued with no bleeding noted. All belongings returned to patient for patient to take home.   Last Vital Signs:  Blood pressure 112/54, pulse 71, temperature 98.1 F (36.7 C), temperature source Oral, resp. rate 18, height 5\' 3"  (1.6 m), weight 56.7 kg (125 lb), SpO2 100 %.  Discharge Medication List   Medication List    STOP taking these medications        amLODipine 10 MG tablet  Commonly known as:  NORVASC     warfarin 5 MG tablet  Commonly known as:  COUMADIN      TAKE these medications        amoxicillin-clavulanate 875-125 MG tablet  Commonly known as:  AUGMENTIN  Take 1 tablet by mouth every 12 (twelve) hours.     atorvastatin 20 MG tablet  Commonly known as:  LIPITOR  TAKE 1 TABLET BY MOUTH EVERY DAY     diltiazem 240 MG 24 hr capsule  Commonly known as:  CARDIZEM CD  Take 1 capsule (240 mg total) by mouth daily.     ibuprofen 200 MG tablet  Commonly known as:  ADVIL,MOTRIN  Take 200-400 mg by mouth every 6 (six) hours as needed for headache or mild pain.     lisinopril 10 MG tablet  Commonly known as:  PRINIVIL,ZESTRIL  Take 1 tablet (10 mg total) by mouth daily.     pantoprazole 40 MG tablet  Commonly known as:  PROTONIX  Take 1 tablet (40 mg total) by mouth daily.     rivaroxaban 20 MG Tabs tablet  Commonly known as:  XARELTO  Take 1 tablet (20 mg total) by mouth daily.

## 2016-02-03 NOTE — Care Management Note (Signed)
Case Management Note  Patient Details  Name: Mignon PineDoan T Sorlie MRN: [id number] Date of Birth: 07-25-1941  Subjective/Objective:                    Action/Plan: Plan is to d/c to home today.  Expected Discharge Date:   02/03/16           Expected Discharge Plan:  Home/Self Care  In-House Referral:  NA  Discharge planning Services  CM Consult  Status of Service:  Completed, signed off   Epifanio LeschesCole, [place] Quezada [place], [place]RN 02/03/2016, 2:09 PM

## 2016-02-18 DIAGNOSIS — K352 Acute appendicitis with generalized peritonitis: Secondary | ICD-10-CM | POA: Diagnosis not present

## 2016-03-24 DIAGNOSIS — K352 Acute appendicitis with generalized peritonitis: Secondary | ICD-10-CM | POA: Diagnosis not present

## 2016-03-28 ENCOUNTER — Other Ambulatory Visit: Payer: Self-pay | Admitting: Surgery

## 2016-03-28 DIAGNOSIS — K3532 Acute appendicitis with perforation and localized peritonitis, without abscess: Secondary | ICD-10-CM

## 2016-04-13 ENCOUNTER — Inpatient Hospital Stay: Admission: RE | Admit: 2016-04-13 | Payer: Medicare HMO | Source: Ambulatory Visit

## 2016-04-18 ENCOUNTER — Ambulatory Visit: Payer: Self-pay | Admitting: Internal Medicine

## 2016-04-18 ENCOUNTER — Ambulatory Visit
Admission: RE | Admit: 2016-04-18 | Discharge: 2016-04-18 | Disposition: A | Discharge status: Home / Self Care | Payer: Medicare HMO | Source: Ambulatory Visit | Attending: Surgery | Admitting: Surgery

## 2016-04-18 DIAGNOSIS — K3532 Acute appendicitis with perforation and localized peritonitis, without abscess: Secondary | ICD-10-CM

## 2016-04-18 DIAGNOSIS — K352 Acute appendicitis with generalized peritonitis: Secondary | ICD-10-CM | POA: Diagnosis not present

## 2016-04-18 MED ORDER — IOPAMIDOL (ISOVUE-300) INJECTION 61%
100.0000 mL | Freq: Once | INTRAVENOUS | Status: AC | PRN
  Administered 2016-04-18: 100 mL via INTRAVENOUS

## 2016-04-19 ENCOUNTER — Encounter: Payer: Self-pay | Admitting: Internal Medicine

## 2016-04-27 DIAGNOSIS — E559 Vitamin D deficiency, unspecified: Secondary | ICD-10-CM | POA: Diagnosis not present

## 2016-04-27 DIAGNOSIS — R739 Hyperglycemia, unspecified: Secondary | ICD-10-CM | POA: Diagnosis not present

## 2016-04-27 DIAGNOSIS — E785 Hyperlipidemia, unspecified: Secondary | ICD-10-CM | POA: Diagnosis not present

## 2016-05-01 DIAGNOSIS — I10 Essential (primary) hypertension: Secondary | ICD-10-CM | POA: Diagnosis not present

## 2016-05-01 DIAGNOSIS — Z760 Encounter for issue of repeat prescription: Secondary | ICD-10-CM | POA: Diagnosis not present

## 2016-05-01 DIAGNOSIS — F17210 Nicotine dependence, cigarettes, uncomplicated: Secondary | ICD-10-CM | POA: Diagnosis not present

## 2016-05-01 DIAGNOSIS — R42 Dizziness and giddiness: Secondary | ICD-10-CM | POA: Diagnosis not present

## 2016-05-01 DIAGNOSIS — R079 Chest pain, unspecified: Secondary | ICD-10-CM | POA: Diagnosis not present

## 2016-05-01 DIAGNOSIS — E785 Hyperlipidemia, unspecified: Secondary | ICD-10-CM | POA: Diagnosis not present

## 2016-05-01 DIAGNOSIS — E559 Vitamin D deficiency, unspecified: Secondary | ICD-10-CM | POA: Diagnosis not present

## 2016-05-01 DIAGNOSIS — I499 Cardiac arrhythmia, unspecified: Secondary | ICD-10-CM | POA: Diagnosis not present

## 2016-05-01 DIAGNOSIS — I4892 Unspecified atrial flutter: Secondary | ICD-10-CM | POA: Diagnosis not present

## 2016-05-01 DIAGNOSIS — R69 Illness, unspecified: Secondary | ICD-10-CM | POA: Diagnosis not present

## 2016-05-01 DIAGNOSIS — I4891 Unspecified atrial fibrillation: Secondary | ICD-10-CM | POA: Diagnosis not present

## 2016-05-01 DIAGNOSIS — R739 Hyperglycemia, unspecified: Secondary | ICD-10-CM | POA: Diagnosis not present

## 2016-05-01 DIAGNOSIS — E119 Type 2 diabetes mellitus without complications: Secondary | ICD-10-CM | POA: Diagnosis not present

## 2016-05-01 DIAGNOSIS — G8929 Other chronic pain: Secondary | ICD-10-CM | POA: Diagnosis not present

## 2016-05-01 DIAGNOSIS — M47814 Spondylosis without myelopathy or radiculopathy, thoracic region: Secondary | ICD-10-CM | POA: Diagnosis not present

## 2016-05-01 DIAGNOSIS — R002 Palpitations: Secondary | ICD-10-CM | POA: Diagnosis not present

## 2016-08-03 DIAGNOSIS — E785 Hyperlipidemia, unspecified: Secondary | ICD-10-CM | POA: Diagnosis not present

## 2016-08-03 DIAGNOSIS — E119 Type 2 diabetes mellitus without complications: Secondary | ICD-10-CM | POA: Diagnosis not present

## 2016-08-03 DIAGNOSIS — I10 Essential (primary) hypertension: Secondary | ICD-10-CM | POA: Diagnosis not present

## 2016-08-03 DIAGNOSIS — K219 Gastro-esophageal reflux disease without esophagitis: Secondary | ICD-10-CM | POA: Diagnosis not present

## 2016-08-03 DIAGNOSIS — Z760 Encounter for issue of repeat prescription: Secondary | ICD-10-CM | POA: Diagnosis not present

## 2016-08-03 DIAGNOSIS — R69 Illness, unspecified: Secondary | ICD-10-CM | POA: Diagnosis not present

## 2016-08-03 DIAGNOSIS — Z23 Encounter for immunization: Secondary | ICD-10-CM | POA: Diagnosis not present

## 2016-08-27 ENCOUNTER — Emergency Department (HOSPITAL_COMMUNITY)
Admission: EM | Admit: 2016-08-27 | Discharge: 2016-08-27 | Disposition: A | Discharge status: Home / Self Care | Payer: Medicare HMO | Attending: Emergency Medicine | Admitting: Emergency Medicine

## 2016-08-27 ENCOUNTER — Emergency Department (HOSPITAL_COMMUNITY): Payer: Medicare HMO

## 2016-08-27 ENCOUNTER — Encounter (HOSPITAL_COMMUNITY): Payer: Self-pay | Admitting: *Deleted

## 2016-08-27 DIAGNOSIS — I2510 Atherosclerotic heart disease of native coronary artery without angina pectoris: Secondary | ICD-10-CM | POA: Diagnosis not present

## 2016-08-27 DIAGNOSIS — I4901 Ventricular fibrillation: Secondary | ICD-10-CM | POA: Diagnosis not present

## 2016-08-27 DIAGNOSIS — Z8673 Personal history of transient ischemic attack (TIA), and cerebral infarction without residual deficits: Secondary | ICD-10-CM | POA: Insufficient documentation

## 2016-08-27 DIAGNOSIS — R002 Palpitations: Secondary | ICD-10-CM | POA: Diagnosis present

## 2016-08-27 DIAGNOSIS — I10 Essential (primary) hypertension: Secondary | ICD-10-CM | POA: Insufficient documentation

## 2016-08-27 DIAGNOSIS — Z7901 Long term (current) use of anticoagulants: Secondary | ICD-10-CM | POA: Diagnosis not present

## 2016-08-27 DIAGNOSIS — R531 Weakness: Secondary | ICD-10-CM | POA: Diagnosis not present

## 2016-08-27 DIAGNOSIS — I4891 Unspecified atrial fibrillation: Secondary | ICD-10-CM | POA: Diagnosis not present

## 2016-08-27 DIAGNOSIS — R404 Transient alteration of awareness: Secondary | ICD-10-CM | POA: Diagnosis not present

## 2016-08-27 DIAGNOSIS — F17210 Nicotine dependence, cigarettes, uncomplicated: Secondary | ICD-10-CM | POA: Diagnosis not present

## 2016-08-27 DIAGNOSIS — R000 Tachycardia, unspecified: Secondary | ICD-10-CM | POA: Diagnosis not present

## 2016-08-27 DIAGNOSIS — R69 Illness, unspecified: Secondary | ICD-10-CM | POA: Diagnosis not present

## 2016-08-27 HISTORY — DX: Cerebral infarction, unspecified: I63.9

## 2016-08-27 LAB — COMPREHENSIVE METABOLIC PANEL
ALBUMIN: 4 g/dL (ref 3.5–5.0)
ALT: 13 U/L — ABNORMAL LOW (ref 14–54)
AST: 20 U/L (ref 15–41)
Alkaline Phosphatase: 66 U/L (ref 38–126)
Anion gap: 8 (ref 5–15)
BUN: 11 mg/dL (ref 6–20)
CHLORIDE: 108 mmol/L (ref 101–111)
CO2: 23 mmol/L (ref 22–32)
Calcium: 9.5 mg/dL (ref 8.9–10.3)
Creatinine, Ser: 0.79 mg/dL (ref 0.44–1.00)
GFR calc Af Amer: >60 mL/min (ref >60)
GFR calc non Af Amer: >60 mL/min (ref >60)
GLUCOSE: 97 mg/dL (ref 65–99)
POTASSIUM: 3.9 mmol/L (ref 3.5–5.1)
SODIUM: 139 mmol/L (ref 135–145)
Total Bilirubin: 0.4 mg/dL (ref 0.3–1.2)
Total Protein: 7.4 g/dL (ref 6.5–8.1)

## 2016-08-27 LAB — CBC
HEMATOCRIT: 43.7 % (ref 36.0–46.0)
HEMOGLOBIN: 15.1 g/dL — AB (ref 12.0–15.0)
MCH: 29.4 pg (ref 26.0–34.0)
MCHC: 34.6 g/dL (ref 30.0–36.0)
MCV: 85.2 fL (ref 78.0–100.0)
Platelets: 306 10*3/uL (ref 150–400)
RBC: 5.13 MIL/uL — AB (ref 3.87–5.11)
RDW: 13 % (ref 11.5–15.5)
WBC: 9.8 10*3/uL (ref 4.0–10.5)

## 2016-08-27 LAB — I-STAT TROPONIN, ED: Troponin i, poc: 0.01 ng/mL (ref 0.00–0.08)

## 2016-08-27 LAB — MAGNESIUM: Magnesium: 2 mg/dL (ref 1.7–2.4)

## 2016-08-27 LAB — TSH: TSH: 1.384 u[IU]/mL (ref 0.350–4.500)

## 2016-08-27 MED ORDER — DILTIAZEM HCL 25 MG/5ML IV SOLN
10.0000 mg | Freq: Once | INTRAVENOUS | Status: AC
  Administered 2016-08-27: 10 mg via INTRAVENOUS
  Filled 2016-08-27: qty 5

## 2016-08-27 MED ORDER — DILTIAZEM LOAD VIA INFUSION
10.0000 mg | Freq: Once | INTRAVENOUS | Status: AC
  Administered 2016-08-27: 10 mg via INTRAVENOUS
  Filled 2016-08-27: qty 10

## 2016-08-27 MED ORDER — SODIUM CHLORIDE 0.9 % IV BOLUS (SEPSIS)
500.0000 mL | Freq: Once | INTRAVENOUS | Status: AC
  Administered 2016-08-27: 500 mL via INTRAVENOUS

## 2016-08-27 MED ORDER — DILTIAZEM HCL 100 MG IV SOLR
5.0000 mg/h | INTRAVENOUS | Status: DC
  Administered 2016-08-27: 5 mg/h via INTRAVENOUS
  Filled 2016-08-27: qty 100

## 2016-08-27 MED ORDER — SODIUM CHLORIDE 0.9 % IV BOLUS (SEPSIS): 500.0000 mL | Freq: Once | INTRAVENOUS | Status: DC

## 2016-08-27 MED ORDER — PROPOFOL 10 MG/ML IV BOLUS
INTRAVENOUS | Status: AC | PRN
  Administered 2016-08-27: 25 ug via INTRAVENOUS

## 2016-08-27 MED ORDER — PROPOFOL 10 MG/ML IV BOLUS
50.0000 mg | Freq: Once | INTRAVENOUS | Status: AC
  Administered 2016-08-27: 25 mg via INTRAVENOUS
  Filled 2016-08-27: qty 20

## 2016-08-27 NOTE — ED Notes (Signed)
Spoke with pt on translator phone.  She states no leg numbness and no dizziness or chest pressure.  Family and pt given opportunity to ask questions.

## 2016-08-27 NOTE — ED Provider Notes (Signed)
[facility]-EMER[facility]ENCY DEPT Provider Note   CSN: [id number] Arrival date & time: 08/27/16  1602     History   Chief Complaint Chief Complaint  Patient presents with  . Atrial Fibrillation    HPI Linda [name] is a 76 y.o. female.   Palpitations   This is a recurrent problem. The current episode started 3 to 5 hours ago. The problem occurs constantly. The problem has been gradually improving. On average, each episode lasts 4 hours. Associated symptoms include numbness (b/l foot tingling during episode), irregular heartbeat and weakness (both legs felt weak while lightheaded but now improved). Pertinent negatives include no fever, no chest pain, no chest pressure, no exertional chest pressure, no near-syncope, no syncope, no abdominal pain, no nausea, no dizziness and no shortness of breath. Associated symptoms comments: lightheaded.  -Patient states she was in church when she began to feel lightheaded and having palpitations. Patient denies any syncope, chest pain, chest pressure, shortness of breath. Patient did experience foot tingling bilaterally and felt weak in her legs and both legs. Patient states her symptoms have since been improving gradually. Patient is on pradaxa and extended release diltiazem which she has been compliant with.  Past Medical History:  Diagnosis Date  . Atrial fibrillation (HCC)    dx in setting of stroke 4/12;  echo with EF 60-65%, mild LVH, mild AI, grade 1 diast dysfxn  . Coronary artery disease   . History of CVA (cerebrovascular accident)    New diagnosis December 08, 2010  . Hx of cardiovascular stress test    a. Myoview 8/12:  EF 69%, no ischemia.   . Hyperlipidemia   . Hypertension   . Impaired glucose tolerance 05/08/2011  . Stroke [facility](HCC) 2012   no deficits  . Tobacco abuse    Ongoing (3-6 cigarettes per day, 25 pack year history)    Patient Active Problem List   Diagnosis Date Noted  . Sepsis (HCC) secondary to ruptured appendix 02/03/2016  . Abnormal  computed tomography of cecum and terminal ileum   . Lower GI bleeding   . PAF (paroxysmal atrial fibrillation) (HCC)   . Diarrhea of presumed infectious origin   . Lower abdominal pain   . Generalized abdominal pain   . Appendicitis 01/24/2016  . Syncope 01/24/2016  . Chronic a-fib (HCC)   . Left flank pain 08/27/2013  . Diastolic dysfunction 05/08/2011  . Smoker 05/08/2011  . Impaired glucose tolerance 05/08/2011  . Encounter for long-term (current) use of high-risk medication 05/08/2011  . Hypertension 03/27/2011  . Hyperlipidemia 03/27/2011  . Atrial fibrillation (HCC) 12/16/2010  . CVA (cerebral vascular accident) (HCC) 12/16/2010    Past Surgical History:  Procedure Laterality Date  . COLONOSCOPY N/A 02/01/2016   Procedure: COLONOSCOPY;  Surgeon: Ruffin FrederickSteven Paul Armbruster, MD;  [facility];  Service: Gastroenterology;  Laterality: N/A;    OB History    No data available       Home Medications    Prior to Admission medications   Medication Sig Start Date End Date Taking? Authorizing Provider  amoxicillin-clavulanate (AUGMENTIN) 875-125 MG tablet Take 1 tablet by mouth every 12 (twelve) hours. 02/03/16   Maryruth Bunhristina P Rama, MD  atorvastatin (LIPITOR) 20 MG tablet TAKE 1 TABLET BY MOUTH EVERY DAY    Duke SalviaSteven C Klein, MD  diltiazem (CARDIZEM CD) 240 MG 24 hr capsule Take 1 capsule (240 mg total) by mouth daily. 02/03/16   Maryruth Bunhristina P Rama, MD  ibuprofen (ADVIL,MOTRIN) 200 MG tablet Take 200-400 mg  by mouth every 6 (six) hours as needed for headache or mild pain.    Historical Provider, MD  lisinopril (PRINIVIL,ZESTRIL) 10 MG tablet Take 1 tablet (10 mg total) by mouth daily. 02/03/16   Christina P Rama, MD  pantoprazole (PROTONIX) 40 MG tablet Take 1 tablet (40 mg total) by mouth daily. 02/03/16   Maryruth Bun Rama, MD  rivaroxaban (XARELTO) 20 MG TABS tablet Take 1 tablet (20 mg total) by mouth daily. 02/03/16   Maryruth Bun Rama, MD    Family History Family History    Problem Relation Age of Onset  . Coronary artery disease Neg Hx     Social History Social History  Substance Use Topics  . Smoking status: Light Tobacco Smoker    Packs/day: 0.25    Years: 25.00    Types: Cigarettes  . Smokeless tobacco: Never Used     Comment: 3-6 cigarettes per day  . Alcohol use No     Allergies   Patient has no known allergies.   Review of Systems Review of Systems  Constitutional: Negative for fever.  Eyes: Negative for visual disturbance.  Respiratory: Negative for shortness of breath.   Cardiovascular: Positive for palpitations. Negative for chest pain, leg swelling, syncope and near-syncope.  Gastrointestinal: Negative for abdominal pain and nausea.  Neurological: Positive for weakness (both legs felt weak while lightheaded but now improved), light-headedness and numbness (b/l foot tingling during episode). Negative for dizziness, seizures, syncope and facial asymmetry.  All other systems reviewed and are negative.    Physical Exam Updated Vital Signs BP (!) 148/103   Pulse 95   Temp 98.9 F (37.2 C) (Oral)   Resp 16   Ht 4\' 11"  (1.499 m)   Wt 49.9 kg   SpO2 98%   BMI 22.22 kg/m   Physical Exam  Constitutional: She appears well-developed and well-nourished. No distress.  HENT:  Head: Normocephalic and atraumatic.  Mouth/Throat: Oropharynx is clear and moist.  Eyes: Conjunctivae and EOM are normal. Pupils are equal, round, and reactive to light.  Neck: Neck supple.  Cardiovascular: Intact distal pulses.  An irregularly irregular rhythm present. Tachycardia present.   No murmur heard. Pulmonary/Chest: Effort normal and breath sounds normal. No respiratory distress. She has no wheezes. She has no rales.  Abdominal: Soft. There is no tenderness.  Musculoskeletal: She exhibits no edema.  Neurological: She is alert.  Skin: Skin is warm and dry.  Psychiatric: She has a normal mood and affect.  Nursing note and vitals reviewed.    ED  Treatments / Results  Labs (all labs ordered are listed, but only abnormal results are displayed) Labs Reviewed  CBC - Abnormal; Notable for the following:       Result Value   RBC 5.13 (*)    Hemoglobin 15.1 (*)    All other components within normal limits  COMPREHENSIVE METABOLIC PANEL - Abnormal; Notable for the following:    ALT 13 (*)    All other components within normal limits  TSH  MAGNESIUM  I-STAT TROPOININ, ED    EKG  EKG Interpretation  Date/Time:  Sunday August 27 2016 16:23:50 EST Ventricular Rate:  145 PR Interval:    QRS Duration: 96 QT Interval:  316 QTC Calculation: 500 R Axis:   74 Text Interpretation:  Atrial fibrillation with rapid V-rate Non-specific ST-t changes Confirmed by Denton Lank  MD, Caryn Bee ([id number]) on 08/27/2016 4:34:18 PM       Radiology Dg Chest 2 View  Result Date: 08/27/2016  CLINICAL DATA:  76 year old with bilateral lower extremity weakness and numbness, patient unable to stand earlier today. Current history of atrial fibrillation, hypertension, and coronary artery disease. Prior stroke. EXAM: CHEST  2 VIEW COMPARISON:  05/01/2016, 01/25/2016 dating back to 12/08/2010. FINDINGS: Cardiac silhouette mildly enlarged, unchanged. Thoracic aorta mildly atherosclerotic, unchanged. Hilar and mediastinal contours otherwise unremarkable. Lungs clear. Bronchovascular markings normal. Pulmonary vascularity normal. No visible pleural effusions. No pneumothorax. Thoracic dextroscoliosis. IMPRESSION: 1. Stable mild cardiomegaly.  No acute cardiopulmonary disease. 2. Thoracic aortic atherosclerosis. Electronically Signed   By: Hulan Saas M.D.   On: 08/27/2016 17:03    Procedures .Sedation Date/Time: 08/27/2016 8:00 PM Performed by: Dwana Melena Authorized by: Cathren Laine   Consent:    Consent obtained:  Written and verbal   Consent given by:  Patient Indications:    Procedure performed:  Cardioversion   Intended level of sedation:  Moderate (conscious  sedation) Pre-sedation assessment:    ASA classification: class 3 - patient with severe systemic disease     Neck mobility: normal     Mouth opening:  3 or more finger widths Immediate pre-procedure details:    Reassessment: Patient reassessed immediately prior to procedure     Reviewed: vital signs     Verified: bag valve mask available, oxygen available and suction available   Post-procedure details:    Patient tolerance:  Tolerated well, no immediate complications .Cardioversion Date/Time: 08/27/2016 8:00 PM Performed by: Dwana Melena Authorized by: Cathren Laine   Consent:    Consent obtained:  Verbal and written   Consent given by:  Patient Universal protocol:    Procedure explained and questions answered to patient or proxy's satisfaction: yes     Relevant documents present and verified: yes     Test results available and properly labeled: yes     Patient identity confirmed:  Arm band Attempt one:    Cardioversion mode:  Synchronous   Shock (joules) attempt one: 120J.   Shock outcome:  Conversion to normal sinus rhythm Post-procedure details:    Patient tolerance of procedure:  Tolerated well, no immediate complications   (including critical care time)  Medications Ordered in ED Medications  diltiazem (CARDIZEM) 1 mg/mL load via infusion 10 mg (10 mg Intravenous Bolus from Bag 08/27/16 1841)    And  diltiazem (CARDIZEM) 100 mg in dextrose 5 % 100 mL (1 mg/mL) infusion (0 mg/hr Intravenous Stopped 08/27/16 2042)  propofol (DIPRIVAN) 10 mg/mL bolus/IV push (25 mcg Intravenous Given 08/27/16 2031)  diltiazem (CARDIZEM) injection 10 mg (10 mg Intravenous Given 08/27/16 1800)  sodium chloride 0.9 % bolus 500 mL (500 mLs Intravenous New Bag/Given 08/27/16 1812)  propofol (DIPRIVAN) 10 mg/mL bolus/IV push 50 mg (25 mg Intravenous Given 08/27/16 2031)     Initial Impression / Assessment and Plan / ED Course  I have reviewed the triage vital signs and the nursing notes.  Pertinent labs  & imaging results that were available during my care of the patient were reviewed by me and considered in my medical decision making (see chart for details).  Clinical Course    Patient is a 13 -year-old female with history of A. fib, CVA, hypertension, hyperlipidemia, CAD who presents with palpitations and lightheadedness. Patient also experienced bilateral foot tingling during the lightheadedness. The symptoms have since resolved. Patient denies any chest pain or chest pressure or shortness of breath. Patient states her palpitations and lightheadedness has improved significantly.  On presentation patient is in atrial fibrillation and in an out  of RVR. Patient given 10 mg diltiazem bolus with improvement in heart rate but remains in A. fib. Patient placed on a diltiazem drip for over an hour to see if patient will convert out of A. fib. Heart rate was controlled in the 60s however patient remained in A. fib. Patient is consented for conscious sedation and synchronized cardioversion. Procedure notes above. Patient is cardioverted 1 successfully into sinus rhythm. Patient is monitored for sufficient time after the sedation. Labs obtained as above which were within normal limits. Normal potassium, magnesium, TSH. Patient's Hgb nl.  Patient is discharged in good condition and to follow up with PCP within one week and also follow up with her cardiologist at earliest available appointment.  Pt seen with attending Dr. Denton Lank.     Final Clinical Impressions(s) / ED Diagnoses   Final diagnoses:  Atrial fibrillation with RVR [facility])    New Prescriptions New Prescriptions   No medications on file     Dwana Melena, DO 08/27/16 2117    Cathren Laine, MD 09/01/16 0025

## 2016-08-27 NOTE — ED Notes (Signed)
Synchronized cardioversion to be performed by Dr. Roger Shelterong and Dr. Denton LankSteinl.

## 2016-08-27 NOTE — ED Notes (Signed)
Pt denies numbness to legs at this time.

## 2016-08-27 NOTE — ED Triage Notes (Signed)
[facility] initially called for stroke b/c pt had numbness to both legs, starting at 1500.  Neg stroke scale per [facility].  cbg was 153, hr 98-180 afib, bp 178/95.  20g LAC.  Pt also c/o palpitations to her chest.  Neighbor states pt was dizzy this am.

## 2016-09-06 ENCOUNTER — Ambulatory Visit: Payer: Medicare HMO | Admitting: Internal Medicine

## 2016-10-12 ENCOUNTER — Emergency Department (HOSPITAL_COMMUNITY): Payer: Medicare HMO

## 2016-10-12 ENCOUNTER — Encounter (HOSPITAL_COMMUNITY): Payer: Self-pay | Admitting: Emergency Medicine

## 2016-10-12 ENCOUNTER — Inpatient Hospital Stay (HOSPITAL_COMMUNITY)
Admission: EM | Admit: 2016-10-12 | Discharge: 2016-10-16 | DRG: 815 | Disposition: A | Discharge status: Home / Self Care | Payer: Medicare HMO | Attending: Nephrology | Admitting: Nephrology

## 2016-10-12 DIAGNOSIS — I482 Chronic atrial fibrillation, unspecified: Secondary | ICD-10-CM | POA: Diagnosis present

## 2016-10-12 DIAGNOSIS — Z79899 Other long term (current) drug therapy: Secondary | ICD-10-CM

## 2016-10-12 DIAGNOSIS — T380X5A Adverse effect of glucocorticoids and synthetic analogues, initial encounter: Secondary | ICD-10-CM | POA: Diagnosis present

## 2016-10-12 DIAGNOSIS — I161 Hypertensive emergency: Secondary | ICD-10-CM | POA: Diagnosis not present

## 2016-10-12 DIAGNOSIS — M1000 Idiopathic gout, unspecified site: Secondary | ICD-10-CM | POA: Diagnosis not present

## 2016-10-12 DIAGNOSIS — Z7984 Long term (current) use of oral hypoglycemic drugs: Secondary | ICD-10-CM

## 2016-10-12 DIAGNOSIS — Z720 Tobacco use: Secondary | ICD-10-CM

## 2016-10-12 DIAGNOSIS — D735 Infarction of spleen: Principal | ICD-10-CM | POA: Diagnosis present

## 2016-10-12 DIAGNOSIS — D72829 Elevated white blood cell count, unspecified: Secondary | ICD-10-CM | POA: Diagnosis present

## 2016-10-12 DIAGNOSIS — E784 Other hyperlipidemia: Secondary | ICD-10-CM | POA: Diagnosis present

## 2016-10-12 DIAGNOSIS — Z7901 Long term (current) use of anticoagulants: Secondary | ICD-10-CM

## 2016-10-12 DIAGNOSIS — F17200 Nicotine dependence, unspecified, uncomplicated: Secondary | ICD-10-CM | POA: Diagnosis not present

## 2016-10-12 DIAGNOSIS — I5032 Chronic diastolic (congestive) heart failure: Secondary | ICD-10-CM | POA: Diagnosis present

## 2016-10-12 DIAGNOSIS — I081 Rheumatic disorders of both mitral and tricuspid valves: Secondary | ICD-10-CM | POA: Diagnosis not present

## 2016-10-12 DIAGNOSIS — K8020 Calculus of gallbladder without cholecystitis without obstruction: Secondary | ICD-10-CM | POA: Diagnosis present

## 2016-10-12 DIAGNOSIS — R109 Unspecified abdominal pain: Secondary | ICD-10-CM | POA: Diagnosis not present

## 2016-10-12 DIAGNOSIS — N280 Ischemia and infarction of kidney: Secondary | ICD-10-CM | POA: Diagnosis present

## 2016-10-12 DIAGNOSIS — I4891 Unspecified atrial fibrillation: Secondary | ICD-10-CM | POA: Diagnosis not present

## 2016-10-12 DIAGNOSIS — M109 Gout, unspecified: Secondary | ICD-10-CM | POA: Diagnosis present

## 2016-10-12 DIAGNOSIS — I749 Embolism and thrombosis of unspecified artery: Secondary | ICD-10-CM

## 2016-10-12 DIAGNOSIS — E7849 Other hyperlipidemia: Secondary | ICD-10-CM

## 2016-10-12 DIAGNOSIS — Z8673 Personal history of transient ischemic attack (TIA), and cerebral infarction without residual deficits: Secondary | ICD-10-CM | POA: Diagnosis not present

## 2016-10-12 DIAGNOSIS — I517 Cardiomegaly: Secondary | ICD-10-CM | POA: Diagnosis not present

## 2016-10-12 DIAGNOSIS — R7302 Impaired glucose tolerance (oral): Secondary | ICD-10-CM | POA: Diagnosis not present

## 2016-10-12 DIAGNOSIS — E785 Hyperlipidemia, unspecified: Secondary | ICD-10-CM | POA: Diagnosis not present

## 2016-10-12 DIAGNOSIS — E876 Hypokalemia: Secondary | ICD-10-CM | POA: Diagnosis present

## 2016-10-12 DIAGNOSIS — I712 Thoracic aortic aneurysm, without rupture: Secondary | ICD-10-CM | POA: Diagnosis not present

## 2016-10-12 DIAGNOSIS — I110 Hypertensive heart disease with heart failure: Secondary | ICD-10-CM | POA: Diagnosis not present

## 2016-10-12 DIAGNOSIS — R0603 Acute respiratory distress: Secondary | ICD-10-CM | POA: Diagnosis not present

## 2016-10-12 DIAGNOSIS — F17210 Nicotine dependence, cigarettes, uncomplicated: Secondary | ICD-10-CM | POA: Diagnosis present

## 2016-10-12 DIAGNOSIS — N83202 Unspecified ovarian cyst, left side: Secondary | ICD-10-CM | POA: Diagnosis present

## 2016-10-12 DIAGNOSIS — R079 Chest pain, unspecified: Secondary | ICD-10-CM | POA: Diagnosis not present

## 2016-10-12 DIAGNOSIS — R1084 Generalized abdominal pain: Secondary | ICD-10-CM | POA: Diagnosis not present

## 2016-10-12 DIAGNOSIS — I700 Atherosclerosis of aorta: Secondary | ICD-10-CM | POA: Diagnosis present

## 2016-10-12 DIAGNOSIS — G43909 Migraine, unspecified, not intractable, without status migrainosus: Secondary | ICD-10-CM | POA: Diagnosis not present

## 2016-10-12 DIAGNOSIS — I639 Cerebral infarction, unspecified: Secondary | ICD-10-CM

## 2016-10-12 DIAGNOSIS — Z9119 Patient's noncompliance with other medical treatment and regimen: Secondary | ICD-10-CM

## 2016-10-12 DIAGNOSIS — I2510 Atherosclerotic heart disease of native coronary artery without angina pectoris: Secondary | ICD-10-CM | POA: Diagnosis present

## 2016-10-12 DIAGNOSIS — M10019 Idiopathic gout, unspecified shoulder: Secondary | ICD-10-CM | POA: Diagnosis not present

## 2016-10-12 DIAGNOSIS — K5900 Constipation, unspecified: Secondary | ICD-10-CM | POA: Diagnosis present

## 2016-10-12 DIAGNOSIS — R69 Illness, unspecified: Secondary | ICD-10-CM | POA: Diagnosis not present

## 2016-10-12 DIAGNOSIS — D72828 Other elevated white blood cell count: Secondary | ICD-10-CM | POA: Diagnosis not present

## 2016-10-12 HISTORY — DX: Type 2 diabetes mellitus without complications: E11.9

## 2016-10-12 LAB — COMPREHENSIVE METABOLIC PANEL
ALT: 24 U/L (ref 14–54)
AST: 40 U/L (ref 15–41)
Albumin: 3.8 g/dL (ref 3.5–5.0)
Alkaline Phosphatase: 61 U/L (ref 38–126)
Anion gap: 10 (ref 5–15)
BILIRUBIN TOTAL: 0.4 mg/dL (ref 0.3–1.2)
BUN: 12 mg/dL (ref 6–20)
CO2: 25 mmol/L (ref 22–32)
Calcium: 9 mg/dL (ref 8.9–10.3)
Chloride: 104 mmol/L (ref 101–111)
Creatinine, Ser: 0.79 mg/dL (ref 0.44–1.00)
GFR calc Af Amer: >60 mL/min (ref >60)
Glucose, Bld: 108 mg/dL — ABNORMAL HIGH (ref 65–99)
POTASSIUM: 3.7 mmol/L (ref 3.5–5.1)
Sodium: 139 mmol/L (ref 135–145)
TOTAL PROTEIN: 7.5 g/dL (ref 6.5–8.1)

## 2016-10-12 LAB — GLUCOSE, CAPILLARY: GLUCOSE-CAPILLARY: 151 mg/dL — AB (ref 65–99)

## 2016-10-12 LAB — I-STAT TROPONIN, ED: Troponin i, poc: 0 ng/mL (ref 0.00–0.08)

## 2016-10-12 LAB — APTT: aPTT: 34 seconds (ref 24–36)

## 2016-10-12 LAB — CBC
HEMATOCRIT: 41.1 % (ref 36.0–46.0)
Hemoglobin: 13.8 g/dL (ref 12.0–15.0)
MCH: 28.6 pg (ref 26.0–34.0)
MCHC: 33.6 g/dL (ref 30.0–36.0)
MCV: 85.1 fL (ref 78.0–100.0)
Platelets: 279 10*3/uL (ref 150–400)
RBC: 4.83 MIL/uL (ref 3.87–5.11)
RDW: 13.1 % (ref 11.5–15.5)
WBC: 12.6 10*3/uL — AB (ref 4.0–10.5)

## 2016-10-12 LAB — LIPASE, BLOOD: Lipase: 35 U/L (ref 11–51)

## 2016-10-12 LAB — URINALYSIS, ROUTINE W REFLEX MICROSCOPIC
Bilirubin Urine: NEGATIVE
Glucose, UA: NEGATIVE mg/dL
Hgb urine dipstick: NEGATIVE
Ketones, ur: NEGATIVE mg/dL
LEUKOCYTES UA: NEGATIVE
NITRITE: NEGATIVE
PH: 7 (ref 5.0–8.0)
Protein, ur: NEGATIVE mg/dL
Specific Gravity, Urine: 1.005 (ref 1.005–1.030)

## 2016-10-12 LAB — PROTIME-INR
INR: 0.96
PROTHROMBIN TIME: 12.8 s (ref 11.4–15.2)

## 2016-10-12 LAB — URIC ACID: URIC ACID, SERUM: 7.6 mg/dL — AB (ref 2.3–6.6)

## 2016-10-12 LAB — MRSA PCR SCREENING: MRSA by PCR: NEGATIVE

## 2016-10-12 MED ORDER — ACETAMINOPHEN 650 MG RE SUPP: 650.0000 mg | Freq: Four times a day (QID) | RECTAL | Status: DC | PRN

## 2016-10-12 MED ORDER — ONDANSETRON HCL 4 MG/2ML IJ SOLN
4.0000 mg | Freq: Four times a day (QID) | INTRAMUSCULAR | Status: DC | PRN
  Administered 2016-10-14 – 2016-10-15 (×4): 4 mg via INTRAVENOUS
  Filled 2016-10-12 (×4): qty 2

## 2016-10-12 MED ORDER — KETOROLAC TROMETHAMINE 15 MG/ML IJ SOLN
15.0000 mg | Freq: Four times a day (QID) | INTRAMUSCULAR | Status: DC | PRN
  Administered 2016-10-12: 15 mg via INTRAVENOUS
  Filled 2016-10-12: qty 1

## 2016-10-12 MED ORDER — ONDANSETRON HCL 4 MG PO TABS: 4.0000 mg | ORAL_TABLET | Freq: Four times a day (QID) | ORAL | Status: DC | PRN

## 2016-10-12 MED ORDER — IOPAMIDOL (ISOVUE-300) INJECTION 61%
INTRAVENOUS | Status: AC
  Filled 2016-10-12: qty 100

## 2016-10-12 MED ORDER — LABETALOL HCL 5 MG/ML IV SOLN
0.5000 mg/min | INTRAVENOUS | Status: DC
  Administered 2016-10-12: 0.5 mg/min via INTRAVENOUS
  Filled 2016-10-12: qty 100

## 2016-10-12 MED ORDER — IPRATROPIUM-ALBUTEROL 0.5-2.5 (3) MG/3ML IN SOLN: 3.0000 mL | RESPIRATORY_TRACT | Status: DC | PRN

## 2016-10-12 MED ORDER — SODIUM CHLORIDE 0.9% FLUSH
3.0000 mL | Freq: Two times a day (BID) | INTRAVENOUS | Status: DC
  Administered 2016-10-12 – 2016-10-15 (×7): 3 mL via INTRAVENOUS

## 2016-10-12 MED ORDER — POLYETHYLENE GLYCOL 3350 17 G PO PACK
17.0000 g | PACK | Freq: Two times a day (BID) | ORAL | Status: DC
  Administered 2016-10-12 – 2016-10-13 (×3): 17 g via ORAL
  Filled 2016-10-12 (×3): qty 1

## 2016-10-12 MED ORDER — INSULIN ASPART 100 UNIT/ML ~~LOC~~ SOLN
0.0000 [IU] | Freq: Three times a day (TID) | SUBCUTANEOUS | Status: DC
  Administered 2016-10-13: 3 [IU] via SUBCUTANEOUS
  Administered 2016-10-13 – 2016-10-15 (×4): 2 [IU] via SUBCUTANEOUS

## 2016-10-12 MED ORDER — HEPARIN BOLUS VIA INFUSION
3000.0000 [IU] | Freq: Once | INTRAVENOUS | Status: AC
  Administered 2016-10-12: 3000 [IU] via INTRAVENOUS
  Filled 2016-10-12: qty 3000

## 2016-10-12 MED ORDER — LABETALOL HCL 5 MG/ML IV SOLN
10.0000 mg | Freq: Once | INTRAVENOUS | Status: AC
  Administered 2016-10-12: 10 mg via INTRAVENOUS
  Filled 2016-10-12: qty 4

## 2016-10-12 MED ORDER — TRAMADOL HCL 50 MG PO TABS
50.0000 mg | ORAL_TABLET | Freq: Four times a day (QID) | ORAL | Status: DC | PRN
  Administered 2016-10-12 – 2016-10-14 (×3): 50 mg via ORAL
  Filled 2016-10-12 (×3): qty 1

## 2016-10-12 MED ORDER — HEPARIN (PORCINE) IN NACL 100-0.45 UNIT/ML-% IJ SOLN
800.0000 [IU]/h | INTRAMUSCULAR | Status: AC
  Administered 2016-10-12: 850 [IU]/h via INTRAVENOUS
  Administered 2016-10-13: 800 [IU]/h via INTRAVENOUS
  Filled 2016-10-12 (×2): qty 250

## 2016-10-12 MED ORDER — NICOTINE 7 MG/24HR TD PT24
7.0000 mg | MEDICATED_PATCH | Freq: Every day | TRANSDERMAL | Status: DC
  Administered 2016-10-12 – 2016-10-16 (×5): 7 mg via TRANSDERMAL
  Filled 2016-10-12 (×5): qty 1

## 2016-10-12 MED ORDER — PANTOPRAZOLE SODIUM 40 MG PO TBEC
40.0000 mg | DELAYED_RELEASE_TABLET | Freq: Every day | ORAL | Status: DC
  Administered 2016-10-12 – 2016-10-16 (×5): 40 mg via ORAL
  Filled 2016-10-12 (×5): qty 1

## 2016-10-12 MED ORDER — PREDNISONE 20 MG PO TABS
40.0000 mg | ORAL_TABLET | Freq: Every day | ORAL | Status: DC
  Administered 2016-10-13 – 2016-10-16 (×4): 40 mg via ORAL
  Filled 2016-10-12 (×4): qty 2

## 2016-10-12 MED ORDER — DILTIAZEM HCL ER COATED BEADS 240 MG PO CP24
240.0000 mg | ORAL_CAPSULE | Freq: Every day | ORAL | Status: DC
  Administered 2016-10-12 – 2016-10-14 (×3): 240 mg via ORAL
  Filled 2016-10-12 (×4): qty 1

## 2016-10-12 MED ORDER — SODIUM CHLORIDE 0.9 % IV SOLN
INTRAVENOUS | Status: AC
  Administered 2016-10-12 – 2016-10-14 (×4): via INTRAVENOUS

## 2016-10-12 MED ORDER — TIOTROPIUM BROMIDE MONOHYDRATE 18 MCG IN CAPS
18.0000 ug | ORAL_CAPSULE | Freq: Every day | RESPIRATORY_TRACT | Status: DC
  Administered 2016-10-13 – 2016-10-16 (×4): 18 ug via RESPIRATORY_TRACT
  Filled 2016-10-12: qty 5

## 2016-10-12 MED ORDER — MOMETASONE FURO-FORMOTEROL FUM 100-5 MCG/ACT IN AERO
2.0000 | INHALATION_SPRAY | Freq: Two times a day (BID) | RESPIRATORY_TRACT | Status: DC
  Administered 2016-10-13 – 2016-10-16 (×5): 2 via RESPIRATORY_TRACT
  Filled 2016-10-12 (×2): qty 8.8

## 2016-10-12 MED ORDER — METHYLPREDNISOLONE SODIUM SUCC 125 MG IJ SOLR
60.0000 mg | Freq: Once | INTRAMUSCULAR | Status: AC
  Administered 2016-10-12: 60 mg via INTRAVENOUS
  Filled 2016-10-12: qty 2

## 2016-10-12 MED ORDER — SORBITOL 70 % SOLN: 30.0000 mL | Freq: Every day | Status: DC | PRN

## 2016-10-12 MED ORDER — ACETAMINOPHEN 325 MG PO TABS
650.0000 mg | ORAL_TABLET | Freq: Four times a day (QID) | ORAL | Status: DC | PRN
  Administered 2016-10-14: 650 mg via ORAL
  Filled 2016-10-12: qty 2

## 2016-10-12 MED ORDER — LISINOPRIL 10 MG PO TABS
10.0000 mg | ORAL_TABLET | Freq: Every day | ORAL | Status: DC
  Administered 2016-10-12: 10 mg via ORAL
  Filled 2016-10-12: qty 1

## 2016-10-12 MED ORDER — SENNOSIDES-DOCUSATE SODIUM 8.6-50 MG PO TABS
1.0000 | ORAL_TABLET | Freq: Two times a day (BID) | ORAL | Status: DC
  Administered 2016-10-12 – 2016-10-16 (×8): 1 via ORAL
  Filled 2016-10-12 (×8): qty 1

## 2016-10-12 MED ORDER — LABETALOL HCL 5 MG/ML IV SOLN
5.0000 mg | INTRAVENOUS | Status: DC | PRN
  Administered 2016-10-12: 5 mg via INTRAVENOUS
  Filled 2016-10-12: qty 4

## 2016-10-12 MED ORDER — ATORVASTATIN CALCIUM 20 MG PO TABS
20.0000 mg | ORAL_TABLET | Freq: Every day | ORAL | Status: DC
  Administered 2016-10-12: 20 mg via ORAL
  Filled 2016-10-12: qty 1

## 2016-10-12 MED ORDER — FLEET ENEMA 7-19 GM/118ML RE ENEM: 1.0000 | ENEMA | Freq: Once | RECTAL | Status: DC | PRN

## 2016-10-12 NOTE — H&P (Signed)
History and Physical    Linda [name] ZOX:[id number] DOB: 03-31-1941 DOA: 10/12/2016  PCP: Nicki Reaper, NP  Patient coming from: Home  Chief Complaint: Abdominal pain  Interview was done with the aid of a [place] interpreter  HPI: Linda [name] is a 76 y.o. female [place] female, with medical history significant of atrial fibrillation last cardioverted 08/27/2016 in the emergency department and supposedly discharged on Cardizem and Xarelto with cardiology follow-up, history of coronary artery disease with EF of 60-65%, grade 2 diastolic dysfunction, mild-to-moderate aortic valvular regurgitation, mild mitral valvular regurgitation, mild-to-moderate tricuspid valvular regurgitation per 2-D echo 01/25/2016, hyperlipidemia, hypertension, ongoing tobacco abuse who presents to the ED with diffuse abdominal pain radiating up to her central chest which started 2 weeks prior to admission. Patient stated she's had about 3 episodes of these which last approximately 2 hours 1 a week prior to admission and one the night prior to admission which has been the most severe and ongoing since last night. Patient endorses nausea, occasional shortness of breath, palpitations, generalized weakness, constipation, dizziness, decreased appetite. Patient denies any emesis, no fever, no chills, no melena, no hematemesis, no hematochezia, no dysuria, no lightheadedness, no blurry vision, no syncopal episode. Patient also with some complaints of left great toe pain ongoing for the past 5 days with some associated erythema. Patient does states she's been compliant with her medications however was never given any prescription for anticoagulation post cardioversion in January 2018. Patient does state did not take any of her medications today, day of admission. [facility] were called to admit the patient for further evaluation and management.  ED Course: Patient seen in the ED EKG done showed a normal sinus rhythm. CT  abdomen and pelvis done showed peripheral wedge-shaped structures within the spleen consistent with splenic infarcts, wedge shaped structures in the parenchyma of both kidneys consistent with parenchymal infarcts or possibly foci of pyelonephritis, significant abdominal aortic atherosclerotic change, focal of abnormal enhancement within the medial segment of the left lobe of the liver unchanged, gallstones. Urinalysis was nitrite negative leukocytes negative clear. Comprehensive metabolic profile had a glucose of 108 otherwise was unremarkable. Point-of-care troponin was negative. CBC had a white count of 12.6 otherwise was unremarkable. INR was 0.96. Chest x-ray negative for any acute abnormalities, cardiomegaly, aortic atherosclerosis. Patient also noted to have systolic blood pressures in the 200s.  Review of Systems: As per HPI otherwise 10 point review of systems negative.   Past Medical History:  Diagnosis Date  . Atrial fibrillation (HCC)    dx in setting of stroke 4/12;  echo with EF 60-65%, mild LVH, mild AI, grade 1 diast dysfxn  . Coronary artery disease   . History of CVA (cerebrovascular accident)    New diagnosis December 08, 2010  . Hx of cardiovascular stress test    a. Myoview 8/12:  EF 69%, no ischemia.   . Hyperlipidemia   . Hypertension   . Impaired glucose tolerance 05/08/2011  . Stroke [facility]) 2012   no deficits  . Tobacco abuse    Ongoing (3-6 cigarettes per day, 25 pack year history)    Past Surgical History:  Procedure Laterality Date  . COLONOSCOPY N/A 02/01/2016   Procedure: COLONOSCOPY;  Surgeon: Ruffin Frederick, MD;  Location: [facility] ENDOSCOPY;  Service: Gastroenterology;  Laterality: N/A;     reports that she has been smoking Cigarettes.  She has a 6.25 pack-year smoking history. She has never used smokeless tobacco. She reports that she does not drink  alcohol or use drugs.  No Known Allergies  Family History  Problem Relation Age of Onset  . Coronary  artery disease Neg Hx    Mother deceased age 26 cause unknown. Father deceased patient states was 51 years old at that time and not sure what he passed from.  Prior to Admission medications   Medication Sig Start Date End Date Taking? Authorizing Provider  atorvastatin (LIPITOR) 20 MG tablet TAKE 1 TABLET BY MOUTH EVERY DAY   Yes Duke Salvia, MD  lisinopril (PRINIVIL,ZESTRIL) 10 MG tablet Take 1 tablet (10 mg total) by mouth daily. 02/03/16  Yes Maryruth Bun Rama, MD  metFORMIN (GLUCOPHAGE) 500 MG tablet Take 500 mg by mouth 2 (two) times daily with a meal.   Yes Historical Provider, MD  pantoprazole (PROTONIX) 40 MG tablet Take 1 tablet (40 mg total) by mouth daily. 02/03/16  Yes Maryruth Bun Rama, MD  diltiazem (CARDIZEM CD) 240 MG 24 hr capsule Take 1 capsule (240 mg total) by mouth daily. Patient not taking: Reported on 10/12/2016 02/03/16   Maryruth Bun Rama, MD  rivaroxaban (XARELTO) 20 MG TABS tablet Take 1 tablet (20 mg total) by mouth daily. Patient not taking: Reported on 10/12/2016 02/03/16   Maryruth Bun Rama, MD    Physical Exam: Vitals:   10/12/16 1445 10/12/16 1500 10/12/16 1530 10/12/16 1745  BP: (!) 210/61 (!) 211/62 (!) 207/77 (!) 213/86  Pulse: (!) 56 (!) 53 (!) 55 (!) 57  Resp: 23 26 25 14   Temp:      TempSrc:      SpO2: 100% 98% 100% 100%      Constitutional: NAD, calm, comfortable Vitals:   10/12/16 1445 10/12/16 1500 10/12/16 1530 10/12/16 1745  BP: (!) 210/61 (!) 211/62 (!) 207/77 (!) 213/86  Pulse: (!) 56 (!) 53 (!) 55 (!) 57  Resp: 23 26 25 14   Temp:      TempSrc:      SpO2: 100% 98% 100% 100%   Eyes: PERRLA, EOMI, lids and conjunctivae normal ENMT: Mucous membranes are moist. Posterior pharynx clear of any exudate or lesions.Normal dentition.  Neck: normal, supple, no masses, no thyromegaly Respiratory: clear to auscultation bilaterally, no wheezing, no crackles. Normal respiratory effort. No accessory muscle use.  Cardiovascular: Regular rate and rhythm,  no murmurs / rubs / gallops. No extremity edema. 2+ pedal pulses. No carotid bruits.  Abdomen: Diffuse tenderness to palpation, nondistended, No hepatosplenomegaly. Bowel sounds positive.  Musculoskeletal: no clubbing / cyanosis. No joint deformity upper and lower extremities. Good ROM, no contractures. Normal muscle tone. Left great toe at the MTP joint with erythema, exquisite tenderness to palpation, warmth. Skin: no rashes, lesions, ulcers. No induration Neurologic: CN 2-12 grossly intact. Sensation intact, DTR normal. Strength 5/5 in all 4.  Psychiatric: Normal judgment and insight. Alert and oriented x 3. Normal mood.    Labs on Admission: I have personally reviewed following labs and imaging studies  CBC:  Recent Labs Lab 10/12/16 1116  WBC 12.6*  HGB 13.8  HCT 41.1  MCV 85.1  PLT 279   Basic Metabolic Panel:  Recent Labs Lab 10/12/16 1116  NA 139  K 3.7  CL 104  CO2 25  GLUCOSE 108*  BUN 12  CREATININE 0.79  CALCIUM 9.0   GFR: CrCl cannot be calculated (Unknown ideal weight.). Liver Function Tests:  Recent Labs Lab 10/12/16 1116  AST 40  ALT 24  ALKPHOS 61  BILITOT 0.4  PROT 7.5  ALBUMIN 3.8  Recent Labs Lab 10/12/16 1116  LIPASE 35   No results for input(s): AMMONIA in the last 168 hours. Coagulation Profile:  Recent Labs Lab 10/12/16 1723  INR 0.96   Cardiac Enzymes: No results for input(s): CKTOTAL, CKMB, CKMBINDEX, TROPONINI in the last 168 hours. BNP (last 3 results) No results for input(s): PROBNP in the last 8760 hours. HbA1C: No results for input(s): HGBA1C in the last 72 hours. CBG: No results for input(s): GLUCAP in the last 168 hours. Lipid Profile: No results for input(s): CHOL, HDL, LDLCALC, TRIG, CHOLHDL, LDLDIRECT in the last 72 hours. Thyroid Function Tests: No results for input(s): TSH, T4TOTAL, FREET4, T3FREE, THYROIDAB in the last 72 hours. Anemia Panel: No results for input(s): VITAMINB12, FOLATE, FERRITIN,  TIBC, IRON, RETICCTPCT in the last 72 hours. Urine analysis:    Component Value Date/Time   COLORURINE STRAW (A) 10/12/2016 1234   APPEARANCEUR CLEAR 10/12/2016 1234   LABSPEC 1.005 10/12/2016 1234   PHURINE 7.0 10/12/2016 1234   GLUCOSEU NEGATIVE 10/12/2016 1234   HGBUR NEGATIVE 10/12/2016 1234   BILIRUBINUR NEGATIVE 10/12/2016 1234   KETONESUR NEGATIVE 10/12/2016 1234   PROTEINUR NEGATIVE 10/12/2016 1234   NITRITE NEGATIVE 10/12/2016 1234   LEUKOCYTESUR NEGATIVE 10/12/2016 1234    Radiological Exams on Admission: Ct Abdomen Pelvis W Contrast  Result Date: 10/12/2016 CLINICAL DATA:  Abdominal pain, constipation EXAM: CT ABDOMEN AND PELVIS WITH CONTRAST TECHNIQUE: Multidetector CT imaging of the abdomen and pelvis was performed using the standard protocol following bolus administration of intravenous contrast. CONTRAST:  80 cc Isovue 300 COMPARISON:  CT abdomen pelvis of 04/18/2016 FINDINGS: Lower chest: The lung bases clear. Cardiomegaly is stable. No pericardial effusion is seen. Hepatobiliary: The focus of abnormally increased enhancement within the medial segment of the left lobe of liver described on the prior CT of the abdomen has not changed. No other focal hepatic abnormality is seen. If further assessment is warranted, MRI of the liver could be performed. Gallstones layer within the gallbladder as noted previously. Pancreas: The pancreas is normal in size and the pancreatic duct is not dilated. Spleen: There are now multiple wedge-shaped low-attenuation structures extending to the periphery the spleen most consistent with splenic infarcts. Adrenals/Urinary Tract: The adrenal glands are unremarkable with a probable tiny adrenal adenoma on the right unchanged. The kidneys enhance and there are foci of abnormal profusion in both kidneys extending to the periphery worrisome for either pyelonephritis or focal renal infarction as well. No hydronephrosis is seen. The ureters are normal in  caliber. The urinary bladder is unremarkable. Stomach/Bowel: The stomach is moderately distended with oral contrast. No abnormality is seen. No small bowel distention is seen. There is feces within the colon. No colonic mucosal edema is seen. The appendix and terminal ileum are unremarkable. Vascular/Lymphatic: There is significant abdominal aortic atherosclerosis present with plaque formation. Reproductive: The uterus is unremarkable. A left ovarian cyst is now present of approximately 3.1 cm on image 62 series 2. No free fluid is seen within the pelvis. Other: None. Musculoskeletal: The lumbar vertebrae are in normal alignment. Degenerative disc disease is present primarily at L3-4, L4-5, and L5-S1 levels. IMPRESSION: 1. Peripheral wedge-shaped low-attenuation structures within the spleen most consistent with splenic infarcts. 2. There are several peripheral low-attenuation wedge shaped structures in the parenchyma of both kidneys as well. These could represent parenchymal infarcts or possibly foci of pyelonephritis. Clinical correlation is recommended. 3. Significant abdominal aortic atherosclerotic change. 4. Left ovarian cyst of 3.1 cm. 5. The focus of abnormal  enhancement within the medial segment of the left lobe of liver is unchanged and of questionable significance. Consider MR if further assessment is warranted. 6. Gallstones. 7. Critical Value/emergent results were called by telephone at the time of interpretation on 10/12/2016 at 4:23 pm to [facility] , who verbally acknowledged these results. Electronically Signed   By: Dwyane Dee M.D.   On: 10/12/2016 16:23   Dg Chest Port 1 View  Result Date: 10/12/2016 CLINICAL DATA:  Atrial fibrillation and chest palpitations for several days. EXAM: PORTABLE CHEST 1 VIEW COMPARISON:  PA and lateral chest 08/27/2016 and 05/01/2016. FINDINGS: There is cardiomegaly without edema. Aortic atherosclerosis is noted. No pneumothorax or pleural fluid. No focal bony  abnormality. IMPRESSION: No acute disease. Cardiomegaly. Aortic atherosclerosis. Electronically Signed   By: Drusilla Kanner M.D.   On: 10/12/2016 13:26    EKG: Independently reviewed. Normal sinus rhythm. No significant ST-T wave abnormalities.  Assessment/Plan Principal Problem:   Splenic infarct Active Problems:   Renal infarct [facility])   Hypertensive emergency   Atrial fibrillation (HCC)   CVA (cerebral vascular accident) (HCC)   Hyperlipidemia   Smoker   Impaired glucose tolerance   Chronic a-fib (HCC)   Generalized abdominal pain   Constipation   Gout attack: Probable left great toe   Leukocytosis   #1 splenic infarct/bilateral renal infarcts Patient had presented with abdominal pain. CT abdomen and pelvis consistent with wedge-shaped infarcts of the spleen and bilateral kidneys. Urinalysis is unremarkable. Etiology likely cardiogenic as patient does have a prior history of atrial fibrillation was recently cardioverted and supposedly to be on anticoagulation however patient states was never given a prescription for anticoagulation. Patient with multiple cardiac risk factors including hyperlipidemia, hypertension, ongoing tobacco abuse, history of CVA, coronary artery disease. Patient currently in normal sinus rhythm however in hypertensive emergency with systolic blood pressures in the 200s. CT abdomen and pelvis with noted atherosclerotic disease. Will check a 2-D echo. Check a fasting lipid panel. Place on IV heparin drip. Will need to consult with vascular surgery for further evaluation and recommendations. ED PA status she spoke with vascular surgery. Follow.  #2 hypertensive emergency Likely secondary to problems #1 with abdominal pain in the setting of not taking her antihypertensive medications today. Patient have a states she's been compliant with her medications. Discontinue lisinopril due to recent bilateral infarcts and monitor renal function closely. We'll place back on  home regimen of diltiazem that patient is supposed to be on. Place on a labetalol drip. Follow.  #3 chronic atrial fibrillation Status post cardioversion 08/27/2016. Patient in normal sinus rhythm. Resume home regimen Cardizem for rate control. Patient was supposed to be on anticoagulation however states was never given a prescription. We'll place on IV heparin secondary to problem #1.  #4 constipation Patient states hasn't had a bowel movement in approximately a week. Placed on MiraLAX twice daily. Senokot-S twice daily. Sorbitol when necessary. May need a soapsuds enema if no bowel movement by tomorrow.  #5 tobacco abuse Tobacco cessation. Placed on a nicotine patch.  #6 probable left great toe gout Check a uric acid level. IV Solu-Medrol 60 mg IV 1. Prednisone 40 mg daily 5 days. Follow.  #7 impaired glucose tolerance Patient is on metformin at home however will hold during this hospitalization. Check a hemoglobin A1c. Place on sliding scale insulin.  #8 leukocytosis Likely secondary to probable gout. Urinalysis is negative. Chest x-ray is negative for any acute infiltrate. Monitor while on steroids. Follow.  #9 hyperlipidemia  Check a fasting lipid panel. Continue home dose statin.    DVT prophylaxis: Heparin drip Code Status: Full Family Communication: Updated patient. No family at bedside. Disposition Plan: Home when medically stable. Consults called: Vascular per ED PA Admission status: Admit to inpatient SDU.   [facility] Psych[facility] Health FacilityHOMPSON,DANIEL MD [facility] Pager [phone]  If 7PM-7AM, please contact night-coverage [email or web address] Password [email or web address]  10/12/2016, 6:21 PM

## 2016-10-12 NOTE — ED Notes (Signed)
Admitting MD paged to change pt status to ICU due to stepdown not taking pt on labetalol drip. [facility] called this RN and to page admitting provider.

## 2016-10-12 NOTE — Progress Notes (Signed)
ANTICOAGULATION CONSULT NOTE  Pharmacy Consult for heparin Indication: splenic/renal infarcts  Heparin Dosing Weight: 49.9 kg   Assessment: 75 yof previously discharged 08/27/2016 on Xarelto for afib (has been noncompliant with both Xarelto and diltiazem PTA - documented as not taking per "patient preference" in med rec). Now with renal/splenic infarcts, and Pharmacy consulted to to start heparin. CBC wnl, no bleed documented.  Goal of Therapy:  Heparin level 0.3-0.7 units/ml Monitor platelets by anticoagulation protocol: Yes   Plan:  Heparin 3000 unit bolus (ok to bolus - noncompliant with Xarelto PTA) Start heparin at 850 units/h 6h heparin level Daily heparin level/CBC Monitor s/sx bleeding    Babs BertinHaley Ola Raap, PharmD, BCPS Clinical Pharmacist 10/12/2016 6:02 PM

## 2016-10-12 NOTE — ED Notes (Signed)
Admitting MD in room with patient with this RN, interpreter ipad in use.

## 2016-10-12 NOTE — ED Notes (Signed)
Pt transported to CT ?

## 2016-10-12 NOTE — Consult Note (Addendum)
Hospital Consult    Reason for Consult:  Splenic and renal infarcts, aortic atherosclerosis Referring Physician:  ED MRN #:  [id number]  History of Present Illness: This is a 76 y.o. female presents to ED with several episodes of abdominal pain in the past few weeks. Most recently pain began last night and was severe in nature. It was located in mid abdomen. Pain radiated to upper abdomen. Associated with nausea but not emesis. Denies fevers or chills. Per report had episode of atrial fibrillation last month and is supposed to be anticoagulated but does not appear as though she is on blood thinning medication. Currently resting comfortably in ED.She also has a few day history of left toe pain that has never been present before. Does not have other pain in her feet.   Past Medical History:  Diagnosis Date  . Atrial fibrillation (HCC)    dx in setting of stroke 4/12;  echo with EF 60-65%, mild LVH, mild AI, grade 1 diast dysfxn  . Coronary artery disease   . History of CVA (cerebrovascular accident)    New diagnosis December 08, 2010  . Hx of cardiovascular stress test    a. Myoview 8/12:  EF 69%, no ischemia.   . Hyperlipidemia   . Hypertension   . Impaired glucose tolerance 05/08/2011  . Stroke [facility]) 2012   no deficits  . Tobacco abuse    Ongoing (3-6 cigarettes per day, 25 pack year history)    Past Surgical History:  Procedure Laterality Date  . COLONOSCOPY N/A 02/01/2016   Procedure: COLONOSCOPY;  Surgeon: Ruffin Frederick, MD;  Location: [facility] ENDOSCOPY;  Service: Gastroenterology;  Laterality: N/A;    No Known Allergies  Prior to Admission medications   Medication Sig Start Date End Date Taking? Authorizing Provider  atorvastatin (LIPITOR) 20 MG tablet TAKE 1 TABLET BY MOUTH EVERY DAY   Yes Duke Salvia, MD  lisinopril (PRINIVIL,ZESTRIL) 10 MG tablet Take 1 tablet (10 mg total) by mouth daily. 02/03/16  Yes Maryruth Bun Rama, MD  metFORMIN (GLUCOPHAGE) 500 MG tablet Take  500 mg by mouth 2 (two) times daily with a meal.   Yes Historical Provider, MD  pantoprazole (PROTONIX) 40 MG tablet Take 1 tablet (40 mg total) by mouth daily. 02/03/16  Yes Maryruth Bun Rama, MD  diltiazem (CARDIZEM CD) 240 MG 24 hr capsule Take 1 capsule (240 mg total) by mouth daily. Patient not taking: Reported on 10/12/2016 02/03/16   Maryruth Bun Rama, MD  rivaroxaban (XARELTO) 20 MG TABS tablet Take 1 tablet (20 mg total) by mouth daily. Patient not taking: Reported on 10/12/2016 02/03/16   Maryruth Bun Rama, MD    Social History   Social History  . Marital status: Widowed    Spouse name: N/A  . Number of children: N/A  . Years of education: N/A   Occupational History  .  [employer]  . Not working    Social History Main Topics  . Smoking status: Light Tobacco Smoker    Packs/day: 0.25    Years: 25.00    Types: Cigarettes  . Smokeless tobacco: Never Used     Comment: 3-6 cigarettes per day  . Alcohol use No  . Drug use: No  . Sexual activity: Not on file   Other Topics Concern  . Not on file   Social History Narrative   Lives in [place] with her daughters   Regular diet   No regular exercise but active and independent with all  daily activities     Family History  Problem Relation Age of Onset  . Coronary artery disease Neg Hx     ROS: [x]  Positive   [ ]  Negative   [ ]  All sytems reviewed and are negative Cardiovascular: []  chest pain/pressure []  palpitations []  SOB lying flat []  DOE []  pain in legs while walking [x]  pain in legs at rest - left great toe []  pain in legs at night []  non-healing ulcers []  hx of DVT []  swelling in legs  Pulmonary: []  productive cough []  asthma/wheezing []  home O2  Neurologic: []  weakness in []  arms []  legs []  numbness in []  arms []  legs []  hx of CVA []  mini stroke [] difficulty speaking or slurred speech []  temporary loss of vision in one eye []  dizziness  Hematologic: []  hx of cancer []  bleeding problems []   problems with blood clotting easily  Endocrine:   []  diabetes []  thyroid disease  GI []  vomiting blood []  blood in stool  GU: []  CKD/renal failure []  HD--[]  M/W/F or []  T/T/S []  burning with urination []  blood in urine  Psychiatric: []  anxiety []  depression  Musculoskeletal: []  arthritis []  joint pain  Integumentary: []  rashes []  ulcers  Constitutional: []  fever []  chills   Physical Examination  Vitals:   10/12/16 1530 10/12/16 1745  BP: (!) 207/77 (!) 213/86  Pulse: (!) 55 (!) 57  Resp: 25 14  Temp:     There is no height or weight on file to calculate BMI.  General:  WDWN in NAD HENT: WNL, normocephalic Pulmonary: normal non-labored breathing, without Rales, rhonchi,  wheezing Cardiac: palpable radial, femoral, popliteal, dp/pt bilaterally Abdomen: soft, mild ttp central abdomen without guarding, no masses Musculoskeletal: no muscle wasting or atrophy, erythema overlying left 1st metatarsal head  Neurologic: A&O X 3; Appropriate Affect ; SENSATION: normal; MOTOR FUNCTION:  moving all extremities equally. Speech is fluent/normal   CBC    Component Value Date/Time   WBC 12.6 (H) 10/12/2016 1116   RBC 4.83 10/12/2016 1116   HGB 13.8 10/12/2016 1116   HCT 41.1 10/12/2016 1116   PLT 279 10/12/2016 1116   MCV 85.1 10/12/2016 1116   MCH 28.6 10/12/2016 1116   MCHC 33.6 10/12/2016 1116   RDW 13.1 10/12/2016 1116   LYMPHSABS 2.2 01/24/2016 1135   MONOABS 0.9 01/24/2016 1135   EOSABS 0.0 01/24/2016 1135   BASOSABS 0.0 01/24/2016 1135    BMET    Component Value Date/Time   NA 139 10/12/2016 1116   K 3.7 10/12/2016 1116   CL 104 10/12/2016 1116   CO2 25 10/12/2016 1116   GLUCOSE 108 (H) 10/12/2016 1116   BUN 12 10/12/2016 1116   CREATININE 0.79 10/12/2016 1116   CALCIUM 9.0 10/12/2016 1116   GFRNONAA >60 10/12/2016 1116   GFRAA >60 10/12/2016 1116    COAGS: Lab Results  Component Value Date   INR 1.13 02/03/2016   INR 1.09 02/02/2016   INR  1.20 02/01/2016     Non-Invasive Vascular Imaging:   IMPRESSION: 1. Peripheral wedge-shaped low-attenuation structures within the spleen most consistent with splenic infarcts. 2. There are several peripheral low-attenuation wedge shaped structures in the parenchyma of both kidneys as well. These could represent parenchymal infarcts or possibly foci of pyelonephritis. Clinical correlation is recommended. 3. Significant abdominal aortic atherosclerotic change. 4. Left ovarian cyst of 3.1 cm. 5. The focus of abnormal enhancement within the medial segment of the left lobe of liver is unchanged and of questionable significance.  Consider MR if further assessment is warranted. 6. Gallstones.  ASSESSMENT/PLAN: This is a 76 y.o. female with history of atrial fibrillation not on anticoagulation. Also has significant calcification of her abdominal aorta. Echo to be performed and if no source of embolus detected should get CTA of chest to evaluate for proximal aortic source. Will also order renal and mesenteric duplex. Would benefit from aspirin given vascular disease evident on CT.   Haydin Calandra C. Randie Heinzain, MD [facility] [place] Office: [phone] Pager: [phone]

## 2016-10-12 NOTE — Plan of Care (Signed)
Problem: Pain Managment: Goal: General experience of comfort will improve Outcome: Progressing Discussed care plan and pain management for the evening with some teach back displayed

## 2016-10-12 NOTE — ED Provider Notes (Signed)
[facility]-EMER[facility]ENCY DEPT Provider Note   CSN: [id number] Arrival date & time: 10/12/16  1049  History   Chief Complaint Chief Complaint  Patient presents with  . Abdominal Pain    HPI KENDRIA HALBERG is a 76 y.o. female with pertinent pmh of atrial fibrillation (last cardioverted on 08/27/2016 in ED and discharged with cardizem, xarelto and cardiology f/u), known CAD with LV EF 60-65% on Echo 2017, CVA, hyperlipidemia, hypertension, tobacco abuse presents with diffuse "burning" abdominal pain from lower abdomen that radiates to central chest associated with decreased appetite, nausea, shortness of breath and constipation x 2 weeks.  Patient states pain has happened three times in the last two weeks, last episode last night.  Patient had last BM >1 week ago, still passing gas.  Patient often has constipation, usually has a BM every 3-4 days.  No fevers, urinary symptoms, bloody diarrhea. I asked patient if she had been taking cardizem and xarelto or followed up with cardiologist, pt states she has not.  Patient states she was told to take four medicines only: metformin, lisinopril, pantoprazole and atorvastatin which she has been adherent to.  Patient states she does not have followed up with cardiologist since ED discharge on 08/27/2016.  Patient has noticed palpitations and "heart beating fast" associated with shortness of breath occasionally in the last few weeks.   Patient reports left MTP warmth, redness, edema and tenderness x 5 days.  No trauma.  No h/o gout in the past.   HPI  Past Medical History:  Diagnosis Date  . Atrial fibrillation (HCC)    dx in setting of stroke 4/12;  echo with EF 60-65%, mild LVH, mild AI, grade 1 diast dysfxn  . Coronary artery disease   . History of CVA (cerebrovascular accident)    New diagnosis December 08, 2010  . Hx of cardiovascular stress test    a. Myoview 8/12:  EF 69%, no ischemia.   . Hyperlipidemia   . Hypertension   . Impaired glucose tolerance  05/08/2011  . Stroke [facility]) 2012   no deficits  . Tobacco abuse    Ongoing (3-6 cigarettes per day, 25 pack year history)    Patient Active Problem List   Diagnosis Date Noted  . Splenic infarct 10/12/2016  . Sepsis (HCC) secondary to ruptured appendix 02/03/2016  . Abnormal computed tomography of cecum and terminal ileum   . Lower GI bleeding   . PAF (paroxysmal atrial fibrillation) (HCC)   . Diarrhea of presumed infectious origin   . Lower abdominal pain   . Generalized abdominal pain   . Appendicitis 01/24/2016  . Syncope 01/24/2016  . Chronic a-fib (HCC)   . Left flank pain 08/27/2013  . Diastolic dysfunction 05/08/2011  . Smoker 05/08/2011  . Impaired glucose tolerance 05/08/2011  . Encounter for long-term (current) use of high-risk medication 05/08/2011  . Hypertension 03/27/2011  . Hyperlipidemia 03/27/2011  . Atrial fibrillation (HCC) 12/16/2010  . CVA (cerebral vascular accident) (HCC) 12/16/2010    Past Surgical History:  Procedure Laterality Date  . COLONOSCOPY N/A 02/01/2016   Procedure: COLONOSCOPY;  Surgeon: Ruffin Frederick, MD;  Location: [facility] ENDOSCOPY;  Service: Gastroenterology;  Laterality: N/A;    OB History    No data available       Home Medications    Prior to Admission medications   Medication Sig Start Date End Date Taking? Authorizing Provider  atorvastatin (LIPITOR) 20 MG tablet TAKE 1 TABLET BY MOUTH EVERY DAY   Yes Salvatore Decent  Graciela Husbands, MD  lisinopril (PRINIVIL,ZESTRIL) 10 MG tablet Take 1 tablet (10 mg total) by mouth daily. 02/03/16  Yes Maryruth Bun Rama, MD  metFORMIN (GLUCOPHAGE) 500 MG tablet Take 500 mg by mouth 2 (two) times daily with a meal.   Yes Historical Provider, MD  pantoprazole (PROTONIX) 40 MG tablet Take 1 tablet (40 mg total) by mouth daily. 02/03/16  Yes Maryruth Bun Rama, MD  diltiazem (CARDIZEM CD) 240 MG 24 hr capsule Take 1 capsule (240 mg total) by mouth daily. Patient not taking: Reported on 10/12/2016 02/03/16    Maryruth Bun Rama, MD  rivaroxaban (XARELTO) 20 MG TABS tablet Take 1 tablet (20 mg total) by mouth daily. Patient not taking: Reported on 10/12/2016 02/03/16   Maryruth Bun Rama, MD    Family History Family History  Problem Relation Age of Onset  . Coronary artery disease Neg Hx     Social History Social History  Substance Use Topics  . Smoking status: Light Tobacco Smoker    Packs/day: 0.25    Years: 25.00    Types: Cigarettes  . Smokeless tobacco: Never Used     Comment: 3-6 cigarettes per day  . Alcohol use No     Allergies   Patient has no known allergies.   Review of Systems Review of Systems  Constitutional: Positive for appetite change. Negative for chills and fever.  HENT: Negative for congestion and sore throat.   Eyes: Negative for visual disturbance.  Respiratory: Positive for shortness of breath. Negative for cough, choking and chest tightness.   Cardiovascular: Positive for chest pain. Negative for palpitations and leg swelling.  Gastrointestinal: Positive for abdominal pain, constipation and nausea. Negative for diarrhea and vomiting.  Genitourinary: Negative for difficulty urinating, dysuria, flank pain, frequency, hematuria and pelvic pain.  Musculoskeletal: Positive for arthralgias (left MTP).  Skin: Positive for color change (erythema, warmth and tenderness at L MTP). Negative for rash and wound.  Allergic/Immunologic: Positive for immunocompromised state.  Neurological: Negative for dizziness, seizures, syncope, weakness, light-headedness, numbness and headaches.  Hematological: Does not bruise/bleed easily.  Psychiatric/Behavioral: Negative.      Physical Exam Updated Vital Signs BP (!) 213/86   Pulse (!) 57   Temp 98.1 F (36.7 C) (Oral)   Resp 14   SpO2 100%   Physical Exam  Constitutional: She is oriented to person, place, and time. She appears well-developed and well-nourished. No distress.  HENT:  Head: Normocephalic and atraumatic.    Nose: Nose normal.  Mouth/Throat: Oropharynx is clear and moist. No oropharyngeal exudate.  Eyes: Conjunctivae and EOM are normal. Pupils are equal, round, and reactive to light.  Neck: Normal range of motion. Neck supple. No JVD present.  Cardiovascular: Normal rate, regular rhythm, normal heart sounds and intact distal pulses.   No murmur heard. Elevated SBP 210. Patient did not take BP medications this morning.  Pulmonary/Chest: Effort normal and breath sounds normal. No respiratory distress. She has no wheezes. She has no rales. She exhibits no tenderness.  Abdominal: Soft. Bowel sounds are normal. She exhibits no distension and no mass. There is tenderness. There is no rebound and no guarding.  Diffuse abdominal tenderness with deep palpation. +Murphy's. +McBurney's. Negative Psoas sign.  No suprapubic tenderness. No CVAT.  Non palpable kidneys. No hepatosplenomegaly.   Musculoskeletal: Normal range of motion. She exhibits edema, tenderness and deformity.  Mildly edematous, erythematous, warm and exquisitely tender left MTP.    Lymphadenopathy:    She has no cervical adenopathy.  Neurological: She  is alert and oriented to person, place, and time. No sensory deficit.  LEFT LOWER EXTREMITY 5/5 strength with ankle dorsiflexion and plantar flexion, bilaterally.  Patient able to wiggle left toes. Sensation to light touch intact in the distribution of the saphenous nerve, medial plantar nerve, lateral plantar nerve, bilaterally.   Skin: Skin is warm and dry. Capillary refill takes less than 2 seconds.  Psychiatric: She has a normal mood and affect. Her behavior is normal. Judgment and thought content normal.  Nursing note and vitals reviewed.    ED Treatments / Results  Labs (all labs ordered are listed, but only abnormal results are displayed) Labs Reviewed  COMPREHENSIVE METABOLIC PANEL - Abnormal; Notable for the following:       Result Value   Glucose, Bld 108 (*)    All other  components within normal limits  CBC - Abnormal; Notable for the following:    WBC 12.6 (*)    All other components within normal limits  URINALYSIS, ROUTINE W REFLEX MICROSCOPIC - Abnormal; Notable for the following:    Color, Urine STRAW (*)    All other components within normal limits  URINE CULTURE  LIPASE, BLOOD  URIC ACID  APTT  PROTIME-INR  I-STAT TROPOININ, ED    EKG  EKG Interpretation  Date/Time:  Thursday October 12 2016 11:16:24 EST Ventricular Rate:  60 PR Interval:  160 QRS Duration: 74 QT Interval:  488 QTC Calculation: 488 R Axis:   52 Text Interpretation:  Sinus rhythm with Premature atrial complexes in a pattern of bigeminy Cannot rule out Anterior infarct , age undetermined Abnormal ECG No significant change since last tracing Confirmed by LIU MD, DANA [phone]) on 10/12/2016 3:01:05 PM       Radiology Ct Abdomen Pelvis W Contrast  Result Date: 10/12/2016 CLINICAL DATA:  Abdominal pain, constipation EXAM: CT ABDOMEN AND PELVIS WITH CONTRAST TECHNIQUE: Multidetector CT imaging of the abdomen and pelvis was performed using the standard protocol following bolus administration of intravenous contrast. CONTRAST:  80 cc Isovue 300 COMPARISON:  CT abdomen pelvis of 04/18/2016 FINDINGS: Lower chest: The lung bases clear. Cardiomegaly is stable. No pericardial effusion is seen. Hepatobiliary: The focus of abnormally increased enhancement within the medial segment of the left lobe of liver described on the prior CT of the abdomen has not changed. No other focal hepatic abnormality is seen. If further assessment is warranted, MRI of the liver could be performed. Gallstones layer within the gallbladder as noted previously. Pancreas: The pancreas is normal in size and the pancreatic duct is not dilated. Spleen: There are now multiple wedge-shaped low-attenuation structures extending to the periphery the spleen most consistent with splenic infarcts. Adrenals/Urinary Tract: The  adrenal glands are unremarkable with a probable tiny adrenal adenoma on the right unchanged. The kidneys enhance and there are foci of abnormal profusion in both kidneys extending to the periphery worrisome for either pyelonephritis or focal renal infarction as well. No hydronephrosis is seen. The ureters are normal in caliber. The urinary bladder is unremarkable. Stomach/Bowel: The stomach is moderately distended with oral contrast. No abnormality is seen. No small bowel distention is seen. There is feces within the colon. No colonic mucosal edema is seen. The appendix and terminal ileum are unremarkable. Vascular/Lymphatic: There is significant abdominal aortic atherosclerosis present with plaque formation. Reproductive: The uterus is unremarkable. A left ovarian cyst is now present of approximately 3.1 cm on image 62 series 2. No free fluid is seen within the pelvis. Other: None. Musculoskeletal:  The lumbar vertebrae are in normal alignment. Degenerative disc disease is present primarily at L3-4, L4-5, and L5-S1 levels. IMPRESSION: 1. Peripheral wedge-shaped low-attenuation structures within the spleen most consistent with splenic infarcts. 2. There are several peripheral low-attenuation wedge shaped structures in the parenchyma of both kidneys as well. These could represent parenchymal infarcts or possibly foci of pyelonephritis. Clinical correlation is recommended. 3. Significant abdominal aortic atherosclerotic change. 4. Left ovarian cyst of 3.1 cm. 5. The focus of abnormal enhancement within the medial segment of the left lobe of liver is unchanged and of questionable significance. Consider MR if further assessment is warranted. 6. Gallstones. 7. Critical Value/emergent results were called by telephone at the time of interpretation on 10/12/2016 at 4:23 pm to [facility] CenterCLAUDIA Divine Imber , who verbally acknowledged these results. Electronically Signed   By: Dwyane DeePaul  Barry M.D.   On: 10/12/2016 16:23   Dg Chest Port 1  View  Result Date: 10/12/2016 CLINICAL DATA:  Atrial fibrillation and chest palpitations for several days. EXAM: PORTABLE CHEST 1 VIEW COMPARISON:  PA and lateral chest 08/27/2016 and 05/01/2016. FINDINGS: There is cardiomegaly without edema. Aortic atherosclerosis is noted. No pneumothorax or pleural fluid. No focal bony abnormality. IMPRESSION: No acute disease. Cardiomegaly. Aortic atherosclerosis. Electronically Signed   By: Drusilla Kannerhomas  Dalessio M.D.   On: 10/12/2016 13:26    Procedures Procedures (including critical care time)  Medications Ordered in ED Medications  iopamidol (ISOVUE-300) 61 % injection (not administered)  lisinopril (PRINIVIL,ZESTRIL) tablet 10 mg (not administered)  diltiazem (CARDIZEM CD) 24 hr capsule 240 mg (240 mg Oral Given 10/12/16 1751)  labetalol (NORMODYNE,TRANDATE) 500 mg in dextrose 5 % 125 mL (4 mg/mL) infusion (not administered)  labetalol (NORMODYNE,TRANDATE) injection 10 mg (not administered)     Initial Impression / Assessment and Plan / ED Course  I have reviewed the triage vital signs and the nursing notes.  Pertinent labs & imaging results that were available during my care of the patient were reviewed by me and considered in my medical decision making (see chart for details).  Clinical Course as of Oct 12 1754  Thu Oct 12, 2016  1458 Re-evaluated.  SBP 210, patient asymptomatic no headache, chest pain, shortness of breath.  Patient found asleep.  Pt states she did not take any of her home medications this morning PTA  [CG]  1521 Leukocytosis 12.6 WBC: (!) 12.6 [CG]  1522 FINDINGS: There is cardiomegaly without edema. Aortic atherosclerosis is noted. No pneumothorax or pleural fluid. No focal bony abnormality. DG Chest Port 1 View [CG]  [phone] Troponin i, poc: 0.00 [CG]  1523 Sinus rhythm with Premature atrial complexes in a pattern of bigeminy.  Cannot rule out Anterior infarct , age undetermined  EKG 12-Lead [CG]  1523 Lipase wnl Lipase: 35 [CG]   1523 AST: 40 [CG]  1523 ALT: 24 [CG]  1523 ALT: 24 [CG]  1524 Nitrite: NEGATIVE [CG]  1524 SBP 210, patient given home BP medication (lisinopril) BP: (!) 211/62 [CG]  1628 IMPRESSION: 1. Peripheral wedge-shaped low-attenuation structures within the spleen most consistent with splenic infarcts. 2. There are several peripheral low-attenuation wedge shaped structures in the parenchyma of both kidneys as well. These could represent parenchymal infarcts or possibly foci of pyelonephritis. Clinical correlation is recommended. 3. Significant abdominal aortic atherosclerotic change. 4. Left ovarian cyst of 3.1 cm. 5. The focus of abnormal enhancement within the medial segment of the left lobe of liver is unchanged and of questionable significance. Consider MR if further assessment is warranted. 6.  Gallstones. CT Abdomen Pelvis W Contrast [CG]    Clinical Course User Index [CG] Liberty Handy, PA-C   On exam patient has remained hypertensive at SBP 200 without chest pain, shortness of breath, headache, n/v.  Patient did not take BP meds PTA, lisinopril given.  Patient has diffuse abdominal tenderness, otherwise non surgical abdomen.  Left MTP erythematous, warm, tender and edematous most likely gout, given no h/o trauma.    CT scan A/P revealed splenic infarcts, renal parenchymal infarcts vs ?pyelonphritis and significant abdominal aortic atherosclerotic changes.  New left ovarian cyst noted. No other new findings since previous CT A/P scan. EKG remarkable for NSR with PACs, no other ischemic changes.  CXR showed aortic atherosclerosis otherwise no cardiopulmonary changes.  Troponin x 1 wnl.  CMP, lipase and U/A within normal limits.  CBC remarkable for leukocytosis at 12.6. Given CT scan results will request admission for anticoagulation and most likely echo.  Patient has not been compliant with xarelto or cardizem since discharge from ED when she was cardioverted last month. Patient has not f/u  with cardiologist either. Will request admission for cardiology follow up, heparin and likely echocardiogram as inpatient.    Spoke to hosptalist (Dr.Thompson) who will admit patient and requested vascular surgery consult.  Spoke to vascular surgeon who does not have recommendations at this time, will gladly see patient in floor but does not suspect emergent/urgent interventions at this time.   Final Clinical Impressions(s) / ED Diagnoses   Final diagnoses:  Generalized abdominal pain  Splenic infarct  Renal infarct [facility])    New Prescriptions New Prescriptions   No medications on file     Liberty Handy, PA-C 10/12/16 1756    Lavera Guise, MD 10/12/16 [phone]

## 2016-10-12 NOTE — ED Triage Notes (Addendum)
abd pain  That goes to chest off and on , nausea no vomiting and leg swelling x 3 days

## 2016-10-13 ENCOUNTER — Inpatient Hospital Stay (HOSPITAL_COMMUNITY): Payer: Medicare HMO

## 2016-10-13 DIAGNOSIS — I5032 Chronic diastolic (congestive) heart failure: Secondary | ICD-10-CM

## 2016-10-13 DIAGNOSIS — D735 Infarction of spleen: Secondary | ICD-10-CM

## 2016-10-13 DIAGNOSIS — Z720 Tobacco use: Secondary | ICD-10-CM

## 2016-10-13 DIAGNOSIS — I4891 Unspecified atrial fibrillation: Secondary | ICD-10-CM

## 2016-10-13 DIAGNOSIS — M1000 Idiopathic gout, unspecified site: Secondary | ICD-10-CM

## 2016-10-13 DIAGNOSIS — E784 Other hyperlipidemia: Secondary | ICD-10-CM

## 2016-10-13 LAB — LIPID PANEL
CHOL/HDL RATIO: 5.3 ratio
Cholesterol: 207 mg/dL — ABNORMAL HIGH (ref 0–200)
HDL: 39 mg/dL — AB (ref >40)
LDL Cholesterol: 148 mg/dL — ABNORMAL HIGH (ref 0–99)
TRIGLYCERIDES: 102 mg/dL (ref <150)
VLDL: 20 mg/dL (ref 0–40)

## 2016-10-13 LAB — COMPREHENSIVE METABOLIC PANEL
ALBUMIN: 3.4 g/dL — AB (ref 3.5–5.0)
ALK PHOS: 66 U/L (ref 38–126)
ALT: 31 U/L (ref 14–54)
AST: 47 U/L — AB (ref 15–41)
Anion gap: 13 (ref 5–15)
BILIRUBIN TOTAL: 0.6 mg/dL (ref 0.3–1.2)
BUN: 13 mg/dL (ref 6–20)
CALCIUM: 9 mg/dL (ref 8.9–10.3)
CO2: 21 mmol/L — ABNORMAL LOW (ref 22–32)
CREATININE: 0.98 mg/dL (ref 0.44–1.00)
Chloride: 103 mmol/L (ref 101–111)
GFR calc Af Amer: >60 mL/min (ref >60)
GFR calc non Af Amer: 55 mL/min — ABNORMAL LOW (ref >60)
GLUCOSE: 295 mg/dL — AB (ref 65–99)
Potassium: 3.3 mmol/L — ABNORMAL LOW (ref 3.5–5.1)
Sodium: 137 mmol/L (ref 135–145)
TOTAL PROTEIN: 7.1 g/dL (ref 6.5–8.1)

## 2016-10-13 LAB — CBC
HCT: 38.6 % (ref 36.0–46.0)
HEMOGLOBIN: 13.2 g/dL (ref 12.0–15.0)
MCH: 28.8 pg (ref 26.0–34.0)
MCHC: 34.2 g/dL (ref 30.0–36.0)
MCV: 84.3 fL (ref 78.0–100.0)
PLATELETS: 280 10*3/uL (ref 150–400)
RBC: 4.58 MIL/uL (ref 3.87–5.11)
RDW: 12.8 % (ref 11.5–15.5)
WBC: 13.6 10*3/uL — ABNORMAL HIGH (ref 4.0–10.5)

## 2016-10-13 LAB — HEPARIN LEVEL (UNFRACTIONATED)
HEPARIN UNFRACTIONATED: 0.65 [IU]/mL (ref 0.30–0.70)
Heparin Unfractionated: 0.67 IU/mL (ref 0.30–0.70)

## 2016-10-13 LAB — ECHOCARDIOGRAM COMPLETE
HEIGHTINCHES: 60 in
WEIGHTICAEL: 1770.73 [oz_av]

## 2016-10-13 LAB — GLUCOSE, CAPILLARY
GLUCOSE-CAPILLARY: 195 mg/dL — AB (ref 65–99)
Glucose-Capillary: 152 mg/dL — ABNORMAL HIGH (ref 65–99)
Glucose-Capillary: 235 mg/dL — ABNORMAL HIGH (ref 65–99)

## 2016-10-13 LAB — MAGNESIUM: Magnesium: 2 mg/dL (ref 1.7–2.4)

## 2016-10-13 LAB — URINE CULTURE: Culture: NO GROWTH

## 2016-10-13 MED ORDER — ATORVASTATIN CALCIUM 40 MG PO TABS
40.0000 mg | ORAL_TABLET | Freq: Every day | ORAL | Status: DC
  Administered 2016-10-13 – 2016-10-14 (×2): 40 mg via ORAL
  Filled 2016-10-13 (×2): qty 1

## 2016-10-13 MED ORDER — POTASSIUM CHLORIDE CRYS ER 20 MEQ PO TBCR
40.0000 meq | EXTENDED_RELEASE_TABLET | Freq: Once | ORAL | Status: AC
  Administered 2016-10-13: 40 meq via ORAL
  Filled 2016-10-13: qty 2

## 2016-10-13 MED ORDER — ASPIRIN 325 MG PO TABS
325.0000 mg | ORAL_TABLET | Freq: Every day | ORAL | Status: DC
  Administered 2016-10-13 – 2016-10-16 (×4): 325 mg via ORAL
  Filled 2016-10-13 (×4): qty 1

## 2016-10-13 NOTE — Progress Notes (Signed)
Preliminary results by tech - Mesenteric Duplex Completed. The celiac artery and SMA are patent with greater than 70% stenosis.  Velocities reaching 690 cm/sec in the celiac and 490 cm/sec in the SMA. The IMA appeared patent without significant stenosis.  Marilynne Halstedita Berdia Lachman, BS, RDMS, RVT

## 2016-10-13 NOTE — Progress Notes (Signed)
Preliminary results by tech - Renal Duplex Completed.  Bilateral renal arteries are patent with mildly elevated velocities suggestive of less than 60% stenosis. The celiac artery and SMA demonstrated greater than 70% stenosis. Linda [name], BS, RDMS, RVT

## 2016-10-13 NOTE — Progress Notes (Signed)
PROGRESS NOTE    Linda [name]  JYN:[id number] DOB: 1941-07-24 DOA: 10/12/2016 PCP: Nicki Reaper, NP   Brief Narrative:   Linda [name] is a 76 y.o. female [place] female, with medical history significant of atrial fibrillation last cardioverted 08/27/2016 in the emergency department and supposedly discharged on Cardizem and Xarelto with cardiology follow-up, history of coronary artery disease with EF of 60-65%, grade 2 diastolic dysfunction, mild-to-moderate aortic valvular regurgitation, mild mitral valvular regurgitation, mild-to-moderate tricuspid valvular regurgitation per 2-D echo 01/25/2016, hyperlipidemia, hypertension, ongoing tobacco abuse   Presents to the ED with diffuse abdominal pain radiating up to her central chest which started 2 weeks prior to admission. Patient stated she's had about 3 episodes of these which last approximately 2 hours 1 a week prior to admission and one the night prior to admission which has been the most severe and ongoing since last night. Patient endorses nausea, occasional shortness of breath, palpitations, generalized weakness, constipation, dizziness, decreased appetite. Patient denies any emesis, no fever, no chills, no melena, no hematemesis, no hematochezia, no dysuria, no lightheadedness, no blurry vision, no syncopal episode. Patient also with some complaints of left great toe pain ongoing for the past 5 days with some associated erythema. Patient does states she's been compliant with her medications however was never given any prescription for anticoagulation post cardioversion in January 2018. Patient does state did not take any of her medications today, day of admission. [facility] were called to admit the patient for further evaluation and management.  ED Course: Patient seen in the ED EKG done showed a normal sinus rhythm. CT abdomen and pelvis done showed peripheral wedge-shaped structures within the spleen consistent with splenic infarcts,  wedge shaped structures in the parenchyma of both kidneys consistent with parenchymal infarcts or possibly foci of pyelonephritis, significant abdominal aortic atherosclerotic change, focal of abnormal enhancement within the medial segment of the left lobe of the liver unchanged, gallstones. Urinalysis was nitrite negative leukocytes negative clear. Comprehensive metabolic profile had a glucose of 108 otherwise was unremarkable. Point-of-care troponin was negative. CBC had a white count of 12.6 otherwise was unremarkable. INR was 0.96. Chest x-ray negative for any acute abnormalities, cardiomegaly, aortic atherosclerosis. Patient also noted to have systolic blood pressures in the 200s.   Subjective: 2/23  A/O 4, positive headache.   Assessment & Plan:   Principal Problem:   Splenic infarct Active Problems:   Atrial fibrillation (HCC)   CVA (cerebral vascular accident) (HCC)   Hyperlipidemia   Smoker   Impaired glucose tolerance   Chronic a-fib (HCC)   Generalized abdominal pain   Hypertensive emergency   Renal infarct (HCC)   Constipation   Gout attack: Probable left great toe   Leukocytosis   splenic infarct/bilateral renal infarcts Patient had presented with abdominal pain. CT abdomen and pelvis consistent with wedge-shaped infarcts of the spleen and bilateral kidneys. Urinalysis is unremarkable. Etiology likely cardiogenic as patient does have a prior history of atrial fibrillation was recently cardioverted and supposedly to be on anticoagulation however patient states was never given a prescription for anticoagulation. Patient with multiple cardiac risk factors including hyperlipidemia, hypertension, ongoing tobacco abuse, history of CVA, coronary artery disease. Patient currently in normal sinus rhythm however in hypertensive emergency with systolic blood pressures in the 200s. CT abdomen and pelvis with noted atherosclerotic disease. Will check a 2-D echo. Check a fasting lipid  panel. Place on IV heparin drip. Will need to consult with vascular surgery for further evaluation and recommendations.  ED PA status she spoke with vascular surgery. Follow. -Vascular surgery has ordered multiple studies will await findings and recommendations  #2 hypertensive emergency Likely secondary to problems #1 with abdominal pain in the setting of not taking her antihypertensive medications today. Patient have a states she's been compliant with her medications. Discontinue lisinopril due to recent bilateral infarcts and monitor renal function closely. Place back on home regimen of diltiazem that patient is supposed to be on. Place on a labetalol drip. Follow. -If patient's BP remain stable overnight will transition to oral on 2/24  #3 chronic atrial fibrillation Status post cardioversion 08/27/2016. Patient in normal sinus rhythm. Resume home regimen Cardizem for rate control. Patient was supposed to be on anticoagulation however states was never given a prescription. We'll place on IV heparin secondary to problem #1.  #4 constipation Patient states hasn't had a bowel movement in approximately a week. Placed on MiraLAX twice daily. Senokot-S twice daily. Sorbitol when necessary. May need a soapsuds enema if no bowel movement by tomorrow.  #5 tobacco abuse Tobacco cessation. Placed on a nicotine patch.  #6 probable left great toe gout Check a uric acid level. IV Solu-Medrol 60 mg IV 1. Prednisone 40 mg daily 5 days. Follow.  #7 impaired glucose tolerance Patient is on metformin at home however will hold during this hospitalization. Check a hemoglobin A1c. Place on sliding scale insulin.  #8 leukocytosis Likely secondary to probable gout. Urinalysis is negative. Chest x-ray is negative for any acute infiltrate. Monitor while on steroids. Follow.  #9 hyperlipidemia Check a fasting lipid panel. Continue home dose statin.  Hypokalemia -Potassium goal>4 -K-Dur 40  mEq    DVT prophylaxis: Heparin drip Code Status: Full Family Communication: None Disposition Plan:    Consultants:  Vascular surgery   Procedures/Significant Events:     VENTILATOR SETTINGS:    Cultures   Antimicrobials: Anti-infectives    None       Devices    LINES / TUBES:      Continuous Infusions: . sodium chloride 75 mL/hr at 10/12/16 2223  . heparin 800 Units/hr (10/13/16 0947)     Objective: Vitals:   10/13/16 0850 10/13/16 1100 10/13/16 1247 10/13/16 1621  BP: (!) 148/58 140/70 (!) 144/62 134/62  Pulse: (!) 52 (!) 59 62 (!) 50  Resp: 17 (!) 21 15 (!) 21  Temp: 97.6 F (36.4 C)  98.1 F (36.7 C) 98.2 F (36.8 C)  TempSrc: Oral  Oral Oral  SpO2: 99% 99% 98% 95%  Weight:      Height:        Intake/Output Summary (Last 24 hours) at 10/13/16 1621 Last data filed at 10/13/16 1140  Gross per 24 hour  Intake           2044.9 ml  Output              651 ml  Net           1393.9 ml   Filed Weights   10/12/16 2100 10/13/16 0158  Weight: 50.2 kg (110 lb 10.7 oz) 50.2 kg (110 lb 10.7 oz)    Examination:  General: A/O 4, NADNo acute respiratory distress Eyes: negative scleral hemorrhage, negative anisocoria, negative icterus ENT: Negative Runny nose, negative gingival bleeding, Neck:  Negative scars, masses, torticollis, lymphadenopathy, JVD Lungs: Clear to auscultation bilaterally without wheezes or crackles Cardiovascular: Regular rate and rhythm without murmur gallop or rub normal S1 and S2 Abdomen: negative abdominal pain, nondistended, positive soft, bowel sounds, no  rebound, no ascites, no appreciable mass Extremities: No significant cyanosis, clubbing, or edema bilateral lower extremities Skin: Negative rashes, lesions, ulcers Psychiatric:  Negative depression, negative anxiety, negative fatigue, negative mania  Central nervous system:  Cranial nerves II through XII intact, tongue/uvula midline, all extremities muscle strength  5/5, sensation intact throughout,  negative dysarthria, negative expressive aphasia, negative receptive aphasia.  .     Data Reviewed: Care during the described time interval was provided by me .  I have reviewed this patient's available data, including medical history, events of note, physical examination, and all test results as part of my evaluation. I have personally reviewed and interpreted all radiology studies.  CBC:  Recent Labs Lab 10/12/16 1116 10/13/16 0058  WBC 12.6* 13.6*  HGB 13.8 13.2  HCT 41.1 38.6  MCV 85.1 84.3  PLT 279 280   Basic Metabolic Panel:  Recent Labs Lab 10/12/16 1116 10/13/16 0058  NA 139 137  K 3.7 3.3*  CL 104 103  CO2 25 21*  GLUCOSE 108* 295*  BUN 12 13  CREATININE 0.79 0.98  CALCIUM 9.0 9.0  MG  --  2.0   GFR: Estimated Creatinine Clearance: 35.6 mL/min (by C-G formula based on SCr of 0.98 mg/dL). Liver Function Tests:  Recent Labs Lab 10/12/16 1116 10/13/16 0058  AST 40 47*  ALT 24 31  ALKPHOS 61 66  BILITOT 0.4 0.6  PROT 7.5 7.1  ALBUMIN 3.8 3.4*    Recent Labs Lab 10/12/16 1116  LIPASE 35   No results for input(s): AMMONIA in the last 168 hours. Coagulation Profile:  Recent Labs Lab 10/12/16 1723  INR 0.96   Cardiac Enzymes: No results for input(s): CKTOTAL, CKMB, CKMBINDEX, TROPONINI in the last 168 hours. BNP (last 3 results) No results for input(s): PROBNP in the last 8760 hours. HbA1C: No results for input(s): HGBA1C in the last 72 hours. CBG:  Recent Labs Lab 10/12/16 2125 10/13/16 0849 10/13/16 1246  GLUCAP 151* 152* 195*   Lipid Profile:  Recent Labs  10/13/16 0058  CHOL 207*  HDL 39*  LDLCALC 148*  TRIG 102  CHOLHDL 5.3   Thyroid Function Tests: No results for input(s): TSH, T4TOTAL, FREET4, T3FREE, THYROIDAB in the last 72 hours. Anemia Panel: No results for input(s): VITAMINB12, FOLATE, FERRITIN, TIBC, IRON, RETICCTPCT in the last 72 hours. Urine analysis:    Component  Value Date/Time   COLORURINE STRAW (A) 10/12/2016 1234   APPEARANCEUR CLEAR 10/12/2016 1234   LABSPEC 1.005 10/12/2016 1234   PHURINE 7.0 10/12/2016 1234   GLUCOSEU NEGATIVE 10/12/2016 1234   HGBUR NEGATIVE 10/12/2016 1234   BILIRUBINUR NEGATIVE 10/12/2016 1234   KETONESUR NEGATIVE 10/12/2016 1234   PROTEINUR NEGATIVE 10/12/2016 1234   NITRITE NEGATIVE 10/12/2016 1234   LEUKOCYTESUR NEGATIVE 10/12/2016 1234   Sepsis Labs: @LABRCNTIP (procalcitonin:4,lacticidven:4)  ) Recent Results (from the past 240 hour(s))  Urine culture     Status: None   Collection Time: 10/12/16 12:34 PM  Result Value Ref Range Status   Specimen Description URINE, CLEAN CATCH  Final   Special Requests NONE  Final   Culture NO GROWTH  Final   Report Status 10/13/2016 FINAL  Final  MRSA PCR Screening     Status: None   Collection Time: 10/12/16  8:50 PM  Result Value Ref Range Status   MRSA by PCR NEGATIVE NEGATIVE Final    Comment:        The GeneXpert MRSA Assay (FDA approved for NASAL specimens only), is one component  of a comprehensive MRSA colonization surveillance program. It is not intended to diagnose MRSA infection nor to guide or monitor treatment for MRSA infections.          Radiology Studies: Ct Abdomen Pelvis W Contrast  Result Date: 10/12/2016 CLINICAL DATA:  Abdominal pain, constipation EXAM: CT ABDOMEN AND PELVIS WITH CONTRAST TECHNIQUE: Multidetector CT imaging of the abdomen and pelvis was performed using the standard protocol following bolus administration of intravenous contrast. CONTRAST:  80 cc Isovue 300 COMPARISON:  CT abdomen pelvis of 04/18/2016 FINDINGS: Lower chest: The lung bases clear. Cardiomegaly is stable. No pericardial effusion is seen. Hepatobiliary: The focus of abnormally increased enhancement within the medial segment of the left lobe of liver described on the prior CT of the abdomen has not changed. No other focal hepatic abnormality is seen. If further  assessment is warranted, MRI of the liver could be performed. Gallstones layer within the gallbladder as noted previously. Pancreas: The pancreas is normal in size and the pancreatic duct is not dilated. Spleen: There are now multiple wedge-shaped low-attenuation structures extending to the periphery the spleen most consistent with splenic infarcts. Adrenals/Urinary Tract: The adrenal glands are unremarkable with a probable tiny adrenal adenoma on the right unchanged. The kidneys enhance and there are foci of abnormal profusion in both kidneys extending to the periphery worrisome for either pyelonephritis or focal renal infarction as well. No hydronephrosis is seen. The ureters are normal in caliber. The urinary bladder is unremarkable. Stomach/Bowel: The stomach is moderately distended with oral contrast. No abnormality is seen. No small bowel distention is seen. There is feces within the colon. No colonic mucosal edema is seen. The appendix and terminal ileum are unremarkable. Vascular/Lymphatic: There is significant abdominal aortic atherosclerosis present with plaque formation. Reproductive: The uterus is unremarkable. A left ovarian cyst is now present of approximately 3.1 cm on image 62 series 2. No free fluid is seen within the pelvis. Other: None. Musculoskeletal: The lumbar vertebrae are in normal alignment. Degenerative disc disease is present primarily at L3-4, L4-5, and L5-S1 levels. IMPRESSION: 1. Peripheral wedge-shaped low-attenuation structures within the spleen most consistent with splenic infarcts. 2. There are several peripheral low-attenuation wedge shaped structures in the parenchyma of both kidneys as well. These could represent parenchymal infarcts or possibly foci of pyelonephritis. Clinical correlation is recommended. 3. Significant abdominal aortic atherosclerotic change. 4. Left ovarian cyst of 3.1 cm. 5. The focus of abnormal enhancement within the medial segment of the left lobe of liver  is unchanged and of questionable significance. Consider MR if further assessment is warranted. 6. Gallstones. 7. Critical Value/emergent results were called by telephone at the time of interpretation on 10/12/2016 at 4:23 pm to [facility] , who verbally acknowledged these results. Electronically Signed   By: Dwyane Dee M.D.   On: 10/12/2016 16:23   Dg Chest Port 1 View  Result Date: 10/12/2016 CLINICAL DATA:  Atrial fibrillation and chest palpitations for several days. EXAM: PORTABLE CHEST 1 VIEW COMPARISON:  PA and lateral chest 08/27/2016 and 05/01/2016. FINDINGS: There is cardiomegaly without edema. Aortic atherosclerosis is noted. No pneumothorax or pleural fluid. No focal bony abnormality. IMPRESSION: No acute disease. Cardiomegaly. Aortic atherosclerosis. Electronically Signed   By: Drusilla Kanner M.D.   On: 10/12/2016 13:26        Scheduled Meds: . aspirin  325 mg Oral Daily  . atorvastatin  40 mg Oral Daily  . diltiazem  240 mg Oral Daily  . insulin aspart  0-9 Units  Subcutaneous TID WC  . mometasone-formoterol  2 puff Inhalation BID  . nicotine  7 mg Transdermal Daily  . pantoprazole  40 mg Oral Daily  . polyethylene glycol  17 g Oral BID  . predniSONE  40 mg Oral QAC breakfast  . senna-docusate  1 tablet Oral BID  . sodium chloride flush  3 mL Intravenous Q12H  . tiotropium  18 mcg Inhalation Daily   Continuous Infusions: . sodium chloride 75 mL/hr at 10/12/16 2223  . heparin 800 Units/hr (10/13/16 0947)     LOS: 1 day    Time spent: 40 minutes    WOODS, Roselind Messier, MD [facility] Pager [phone]   If 7PM-7AM, please contact night-coverage [email or web address] Password [facility] 10/13/2016, 4:21 PM

## 2016-10-13 NOTE — Evaluation (Signed)
Occupational Therapy Evaluation Patient Details Name: Linda [name] MRN: [id number] DOB: 1940-12-26 Today's Date: 10/13/2016    History of Present Illness Pt is a 76 y/o female admitted secondary to one week hx of abdominal pain, found to splenic infarcts and in HTN emergency. PMH including but not limited to CAD, A-fib, hx of CVA in 2012, HTN and ongoing tobacco use.   Clinical Impression   Patient evaluated by Occupational Therapy with no further acute OT needs identified. All education has been completed and the patient has no further questions. Pt appears to be at, or close to her baseline and is able to perform ADLs modified independently due to lines.    See below for any follow-up Occupational Therapy or equipment needs. OT is signing off. Thank you for this referral.        Follow Up Recommendations  No OT follow up;Supervision - Intermittent    Equipment Recommendations  None recommended by OT    Recommendations for Other Services       Precautions / Restrictions Precautions Precautions: None Restrictions Weight Bearing Restrictions: No      Mobility Bed Mobility Overal bed mobility: Modified Independent                Transfers Overall transfer level: Modified independent Equipment used: None                  Balance Overall balance assessment: Needs assistance Sitting-balance support: Feet supported Sitting balance-Leahy Scale: Good     Standing balance support: During functional activity;No upper extremity supported Standing balance-Leahy Scale: Good                              ADL Overall ADL's : Modified independent                                             Vision Baseline Vision/History: No visual deficits Patient Visual Report: No change from baseline Vision Assessment?: No apparent visual deficits     Perception     Praxis      Pertinent Vitals/Pain Pain Assessment: No/denies pain      Hand Dominance Right   Extremity/Trunk Assessment Upper Extremity Assessment Upper Extremity Assessment: Overall WFL for tasks assessed   Lower Extremity Assessment Lower Extremity Assessment: Overall WFL for tasks assessed   Cervical / Trunk Assessment Cervical / Trunk Assessment: Normal   Communication Communication Communication: Prefers language other than English   Cognition Arousal/Alertness: Awake/alert Behavior During Therapy: WFL for tasks assessed/performed Overall Cognitive Status: Within Functional Limits for tasks assessed (grossly assessed )                     General Comments       Exercises       Shoulder Instructions      Home Living Family/patient expects to be discharged to:: Private residence Living Arrangements: Children;Other relatives Available Help at Discharge: Family;Available 24 hours/day Type of Home: House Home Access: Level entry     Home Layout: Two level;Able to live on main level with bedroom/bathroom Alternate Level Stairs-Number of Steps: flight   Bathroom Shower/Tub: Tub/shower unit         Home Equipment: Shower seat          Prior Functioning/Environment Level of Independence: Independent  Comments: pt reported independence with ADLs and gait; however, stated that she reached for furniture occasionally        OT Problem List: Decreased strength      OT Treatment/Interventions:      OT Goals(Current goals can be found in the care plan section) Acute Rehab OT Goals Patient Stated Goal: to find out what happened to her OT Goal Formulation: All assessment and education complete, DC therapy  OT Frequency:     Barriers to D/C:            Co-evaluation PT/OT/SLP Co-Evaluation/Treatment: Yes Reason for Co-Treatment: For patient/therapist safety PT goals addressed during session: Mobility/safety with mobility;Balance;Strengthening/ROM OT goals addressed during session: ADL's and  self-care      End of Session Nurse Communication: Mobility status  Activity Tolerance: Patient tolerated treatment well Patient left: in chair;with call bell/phone within reach;with chair alarm set  OT Visit Diagnosis: Unsteadiness on feet (R26.81)                ADL either performed or assessed with clinical judgement  Time: 1355-1430 OT Time Calculation (min): 35 min Charges:  OT General Charges $OT Visit: 1 Procedure OT Evaluation $OT Eval Low Complexity: 1 Procedure G-Codes:     Jeani Hawking, OTR/L [phone]   Jeani Hawking M 10/13/2016, 3:46 PM

## 2016-10-13 NOTE — Care Management Note (Signed)
Case Management Note  Patient Details  Name: Linda [name] MRN: [id number] Date of Birth: 1940/12/05  Subjective/Objective:   Pt lives with daughter, grandchildren, and other relatives.  (I) PTA and did well with therapy today.                             Expected Discharge Plan:  Home/Self Care  Discharge planning Services  CM Consult  Status of Service:  In process, will continue to follow  Magdalene RiverMayo, Jerrin Recore T, RN 10/13/2016, 2:53 PM

## 2016-10-13 NOTE — Progress Notes (Signed)
  Progress Note    10/13/2016 8:35 AM * No surgery found *  Subjective: no new issues  Vitals:   10/13/16 0500 10/13/16 0600  BP: (!) 144/66 (!) 139/57  Pulse: (!) 56 (!) 52  Resp: 16 14  Temp:      Physical Exam: aaox3 Abdominal ttp is improved Palpable pedal pulses  CBC    Component Value Date/Time   WBC 13.6 (H) 10/13/2016 0058   RBC 4.58 10/13/2016 0058   HGB 13.2 10/13/2016 0058   HCT 38.6 10/13/2016 0058   PLT 280 10/13/2016 0058   MCV 84.3 10/13/2016 0058   MCH 28.8 10/13/2016 0058   MCHC 34.2 10/13/2016 0058   RDW 12.8 10/13/2016 0058   LYMPHSABS 2.2 01/24/2016 1135   MONOABS 0.9 01/24/2016 1135   EOSABS 0.0 01/24/2016 1135   BASOSABS 0.0 01/24/2016 1135    BMET    Component Value Date/Time   NA 137 10/13/2016 0058   K 3.3 (L) 10/13/2016 0058   CL 103 10/13/2016 0058   CO2 21 (L) 10/13/2016 0058   GLUCOSE 295 (H) 10/13/2016 0058   BUN 13 10/13/2016 0058   CREATININE 0.98 10/13/2016 0058   CALCIUM 9.0 10/13/2016 0058   GFRNONAA 55 (L) 10/13/2016 0058   GFRAA >60 10/13/2016 0058    INR    Component Value Date/Time   INR 0.96 10/12/2016 1723     Intake/Output Summary (Last 24 hours) at 10/13/16 0835 Last data filed at 10/13/16 0400  Gross per 24 hour  Intake           925.67 ml  Output                1 ml  Net           924.67 ml     Assessment:  76 y.o. female is here with abdominal pain, previously she was cardioverted for afib and not on Ac, now with renal and splenic infarcts.   Plan: Should have CTA of chest at some point to rule out proximal aortic source of embolus Asa and statin for significant atherosclerosis on ct Renal and mesenteric duplex ordered   Brandon C. Randie Heinzain, MD [facility] [place] Office: [phone] Pager: [phone]  10/13/2016 8:35 AM

## 2016-10-13 NOTE — Progress Notes (Signed)
  Echocardiogram 2D Echocardiogram has been performed.  Linda [name] 10/13/2016, 12:47 PM

## 2016-10-13 NOTE — Progress Notes (Signed)
ANTICOAGULATION CONSULT NOTE - Follow Up Consult  Pharmacy Consult for Heparin Indication: splenic/renal infarcts  No Known Allergies  Patient Measurements: Height: 5' (152.4 cm) Weight: 110 lb 10.7 oz (50.2 kg) IBW/kg (Calculated) : 45.5  Vital Signs: Temp: 98 F (36.7 C) (02/22 2300) Temp Source: Oral (02/22 2300) BP: 133/57 (02/22 2300) Pulse Rate: 53 (02/22 2300)  Labs:  Recent Labs  10/12/16 1116 10/12/16 1723 10/13/16 0058  HGB 13.8  --  13.2  HCT 41.1  --  38.6  PLT 279  --  280  APTT  --  34  --   LABPROT  --  12.8  --   INR  --  0.96  --   HEPARINUNFRC  --   --  0.65  CREATININE 0.79  --   --     Estimated Creatinine Clearance: 43.6 mL/min (by C-G formula based on SCr of 0.79 mg/dL).  Assessment: [age] yof previously discharged 08/27/2016 on Xarelto for afib (has been noncompliant with both Xarelto and diltiazem PTA - documented as not taking per "patient preference" in med rec). Pt on heparin for renal infarcts. Heparin level therapeutic (0.65) on gtt at 850 units/hr. No bleeding noted. CBC stable.   Goal of Therapy:  Heparin level 0.3-0.7 units/ml Monitor platelets by anticoagulation protocol: Yes   Plan:  Continue heparin at 850 units/hr Will f/u 6 hr heparin level to confirm therapeutic  Christoper Fabianaron Obie Silos, PharmD, BCPS Clinical pharmacist, pager [phone] 10/13/2016,1:58 AM

## 2016-10-13 NOTE — Evaluation (Signed)
Physical Therapy Evaluation Patient Details Name: Linda [name] MRN: [id number] DOB: 1940-10-19 Today's Date: 10/13/2016   History of Present Illness  Pt is a 76 y/o female admitted secondary to one week hx of abdominal pain, found to splenic infarcts and in HTN emergency. PMH including but not limited to CAD, A-fib, hx of CVA in 2012, HTN and ongoing tobacco use.  Clinical Impression  [place] interpreter used, ID#: [id number]  Pt presented supine in bed with HOB elevated, awake and willing to participate in therapy session. Prior to admission, pt reported that she was independent with all functional mobility and ADLs. Pt currently mod I to supervision level with all functional mobility. No instability or LOB with gait/mobility. All VSS throughout. No further acute PT needs identified at this time. PT signing off.      Follow Up Recommendations No PT follow up    Equipment Recommendations  None recommended by PT    Recommendations for Other Services       Precautions / Restrictions Restrictions Weight Bearing Restrictions: No      Mobility  Bed Mobility Overal bed mobility: Modified Independent                Transfers Overall transfer level: Modified independent Equipment used: None                Ambulation/Gait Ambulation/Gait assistance: Supervision Ambulation Distance (Feet): 400 Feet Assistive device: None Gait Pattern/deviations: Step-through pattern Gait velocity: WFL Gait velocity interpretation: at or above normal speed for age/gender General Gait Details: no instability or LOB, supervision for safety  Stairs            Wheelchair Mobility    Modified Rankin (Stroke Patients Only)       Balance Overall balance assessment: Needs assistance Sitting-balance support: Feet supported Sitting balance-Leahy Scale: Good     Standing balance support: During functional activity;No upper extremity supported Standing balance-Leahy Scale:  Good                               Pertinent Vitals/Pain Pain Assessment: No/denies pain    Home Living Family/patient expects to be discharged to:: Private residence Living Arrangements: Children;Other relatives Available Help at Discharge: Family;Available 24 hours/day Type of Home: House Home Access: Level entry     Home Layout: Two level;Able to live on main level with bedroom/bathroom Home Equipment: Shower seat      Prior Function Level of Independence: Independent         Comments: pt reported independence with ADLs and gait; however, stated that she reached for furniture occasionally     Hand Dominance        Extremity/Trunk Assessment   Upper Extremity Assessment Upper Extremity Assessment: Defer to OT evaluation    Lower Extremity Assessment Lower Extremity Assessment: Overall WFL for tasks assessed       Communication   Communication: Prefers language other than [place]  Cognition Arousal/Alertness: Awake/alert Behavior During Therapy: WFL for tasks assessed/performed Overall Cognitive Status: Within Functional Limits for tasks assessed                      General Comments      Exercises     Assessment/Plan    PT Assessment Patent does not need any further PT services  PT Problem List         PT Treatment Interventions      PT  Goals (Current goals can be found in the Care Plan section)  Acute Rehab PT Goals Patient Stated Goal: to find out what happened to her    Frequency     Barriers to discharge        Co-evaluation PT/OT/SLP Co-Evaluation/Treatment: Yes Reason for Co-Treatment: For patient/therapist safety PT goals addressed during session: Mobility/safety with mobility;Balance;Strengthening/ROM         End of Session   Activity Tolerance: Patient tolerated treatment well Patient left: in chair;with call bell/phone within reach;with chair alarm set Nurse Communication: Mobility  status PT Visit Diagnosis: Pain Pain - part of body:  (abdomen)         Time: 1405-1430 PT Time Calculation (min) (ACUTE ONLY): 25 min   Charges:   PT Evaluation $PT Eval Low Complexity: 1 Procedure     PT G CodesAlessandra Bevels Kambri Dismore 10/13/2016, 2:47 PM Deborah Chalk, PT, DPT [phone]

## 2016-10-13 NOTE — Progress Notes (Signed)
ANTICOAGULATION CONSULT NOTE - Follow Up Consult  Pharmacy Consult for Heparin Indication: splenic/renal infarcts  No Known Allergies  Patient Measurements: Height: 5' (152.4 cm) Weight: 110 lb 10.7 oz (50.2 kg) IBW/kg (Calculated) : 45.5  Vital Signs: Temp: 97.6 F (36.4 C) (02/23 0850) Temp Source: Oral (02/23 0850) BP: 148/58 (02/23 0850) Pulse Rate: 52 (02/23 0850)  Labs:  Recent Labs  10/12/16 1116 10/12/16 1723 10/13/16 0058 10/13/16 0805  HGB 13.8  --  13.2  --   HCT 41.1  --  38.6  --   PLT 279  --  280  --   APTT  --  34  --   --   LABPROT  --  12.8  --   --   INR  --  0.96  --   --   HEPARINUNFRC  --   --  0.65 0.67  CREATININE 0.79  --  0.98  --     Estimated Creatinine Clearance: 35.6 mL/min (by C-G formula based on SCr of 0.98 mg/dL).  Assessment: [age] yof previously discharged 08/27/2016 on Xarelto for afib (has been noncompliant with both Xarelto and diltiazem PTA - documented as not taking per "patient preference" in med rec). Now on heparin gtt for splenic / renal infarcts. Last heparin level remains therapeutic but borderline high at 0.67. CBC stable, no s/s of bleed.  Goal of Therapy:  Heparin level 0.3-0.7 units/ml Monitor platelets by anticoagulation protocol: Yes   Plan:  Decrease heparin gtt slightly to 800 units/hr Monitor daily heparin level, CBC, s/s of bleed F/U vascular workup and plan  Enzo BiNathan Jasmine Mcbeth, PharmD, BCPS Clinical Pharmacist Pager [phone] 10/13/2016 9:47 AM

## 2016-10-14 ENCOUNTER — Inpatient Hospital Stay (HOSPITAL_COMMUNITY): Payer: Medicare HMO

## 2016-10-14 ENCOUNTER — Encounter (HOSPITAL_COMMUNITY): Payer: Self-pay | Admitting: Radiology

## 2016-10-14 DIAGNOSIS — E7849 Other hyperlipidemia: Secondary | ICD-10-CM

## 2016-10-14 DIAGNOSIS — M10019 Idiopathic gout, unspecified shoulder: Secondary | ICD-10-CM

## 2016-10-14 DIAGNOSIS — F17200 Nicotine dependence, unspecified, uncomplicated: Secondary | ICD-10-CM

## 2016-10-14 DIAGNOSIS — Z720 Tobacco use: Secondary | ICD-10-CM

## 2016-10-14 DIAGNOSIS — I5032 Chronic diastolic (congestive) heart failure: Secondary | ICD-10-CM

## 2016-10-14 DIAGNOSIS — I4891 Unspecified atrial fibrillation: Secondary | ICD-10-CM

## 2016-10-14 DIAGNOSIS — I749 Embolism and thrombosis of unspecified artery: Secondary | ICD-10-CM

## 2016-10-14 LAB — GLUCOSE, CAPILLARY
GLUCOSE-CAPILLARY: 161 mg/dL — AB (ref 65–99)
GLUCOSE-CAPILLARY: 220 mg/dL — AB (ref 65–99)
Glucose-Capillary: 111 mg/dL — ABNORMAL HIGH (ref 65–99)
Glucose-Capillary: 160 mg/dL — ABNORMAL HIGH (ref 65–99)

## 2016-10-14 LAB — CBC WITH DIFFERENTIAL/PLATELET
Basophils Absolute: 0 10*3/uL (ref 0.0–0.1)
Basophils Relative: 0 %
EOS PCT: 0 %
Eosinophils Absolute: 0 10*3/uL (ref 0.0–0.7)
HEMATOCRIT: 38.1 % (ref 36.0–46.0)
Hemoglobin: 13.2 g/dL (ref 12.0–15.0)
LYMPHS PCT: 9 %
Lymphs Abs: 2 10*3/uL (ref 0.7–4.0)
MCH: 29.1 pg (ref 26.0–34.0)
MCHC: 34.6 g/dL (ref 30.0–36.0)
MCV: 83.9 fL (ref 78.0–100.0)
MONO ABS: 0.6 10*3/uL (ref 0.1–1.0)
MONOS PCT: 3 %
NEUTROS ABS: 18.5 10*3/uL — AB (ref 1.7–7.7)
Neutrophils Relative %: 88 %
PLATELETS: 247 10*3/uL (ref 150–400)
RBC: 4.54 MIL/uL (ref 3.87–5.11)
RDW: 13.1 % (ref 11.5–15.5)
WBC: 21.1 10*3/uL — ABNORMAL HIGH (ref 4.0–10.5)

## 2016-10-14 LAB — BASIC METABOLIC PANEL
Anion gap: 7 (ref 5–15)
BUN: 16 mg/dL (ref 6–20)
CALCIUM: 8.8 mg/dL — AB (ref 8.9–10.3)
CHLORIDE: 110 mmol/L (ref 101–111)
CO2: 23 mmol/L (ref 22–32)
CREATININE: 0.74 mg/dL (ref 0.44–1.00)
GFR calc non Af Amer: >60 mL/min (ref >60)
Glucose, Bld: 133 mg/dL — ABNORMAL HIGH (ref 65–99)
Potassium: 4.4 mmol/L (ref 3.5–5.1)
SODIUM: 140 mmol/L (ref 135–145)

## 2016-10-14 LAB — CBC
HCT: 34.1 % — ABNORMAL LOW (ref 36.0–46.0)
HEMOGLOBIN: 11.5 g/dL — AB (ref 12.0–15.0)
MCH: 28.3 pg (ref 26.0–34.0)
MCHC: 33.7 g/dL (ref 30.0–36.0)
MCV: 83.8 fL (ref 78.0–100.0)
Platelets: 222 10*3/uL (ref 150–400)
RBC: 4.07 MIL/uL (ref 3.87–5.11)
RDW: 13 % (ref 11.5–15.5)
WBC: 21.7 10*3/uL — ABNORMAL HIGH (ref 4.0–10.5)

## 2016-10-14 LAB — HEMOGLOBIN A1C
HEMOGLOBIN A1C: 6.1 % — AB (ref 4.8–5.6)
Mean Plasma Glucose: 128 mg/dL

## 2016-10-14 LAB — HEPARIN LEVEL (UNFRACTIONATED): HEPARIN UNFRACTIONATED: 0.59 [IU]/mL (ref 0.30–0.70)

## 2016-10-14 LAB — MAGNESIUM: Magnesium: 2 mg/dL (ref 1.7–2.4)

## 2016-10-14 MED ORDER — LABETALOL HCL 5 MG/ML IV SOLN: 15.0000 mg | INTRAVENOUS | Status: DC

## 2016-10-14 MED ORDER — RIVAROXABAN 15 MG PO TABS
15.0000 mg | ORAL_TABLET | Freq: Two times a day (BID) | ORAL | Status: DC
  Administered 2016-10-14 – 2016-10-16 (×4): 15 mg via ORAL
  Filled 2016-10-14 (×4): qty 1

## 2016-10-14 MED ORDER — ATORVASTATIN CALCIUM 40 MG PO TABS
60.0000 mg | ORAL_TABLET | Freq: Every day | ORAL | Status: DC
Start: 2016-10-15 — End: 2016-10-16
  Administered 2016-10-15 – 2016-10-16 (×2): 60 mg via ORAL
  Filled 2016-10-14 (×2): qty 1

## 2016-10-14 MED ORDER — DILTIAZEM HCL 100 MG IV SOLR
10.0000 mg/h | INTRAVENOUS | Status: DC
  Administered 2016-10-14 – 2016-10-15 (×2): 10 mg/h via INTRAVENOUS
  Filled 2016-10-14 (×2): qty 100

## 2016-10-14 MED ORDER — METOPROLOL TARTRATE 5 MG/5ML IV SOLN
7.0000 mg | INTRAVENOUS | Status: AC
  Administered 2016-10-14: 7 mg via INTRAVENOUS
  Filled 2016-10-14: qty 10

## 2016-10-14 MED ORDER — KETOROLAC TROMETHAMINE 30 MG/ML IJ SOLN
30.0000 mg | Freq: Once | INTRAMUSCULAR | Status: AC
  Administered 2016-10-14: 30 mg via INTRAVENOUS
  Filled 2016-10-14: qty 1

## 2016-10-14 MED ORDER — HYDRALAZINE HCL 20 MG/ML IJ SOLN
10.0000 mg | INTRAMUSCULAR | Status: DC
  Administered 2016-10-14 – 2016-10-15 (×8): 10 mg via INTRAVENOUS
  Filled 2016-10-14 (×9): qty 1

## 2016-10-14 MED ORDER — IOPAMIDOL (ISOVUE-370) INJECTION 76%
INTRAVENOUS | Status: AC
  Administered 2016-10-14: 80 mL
  Filled 2016-10-14: qty 100

## 2016-10-14 NOTE — Progress Notes (Addendum)
ANTICOAGULATION CONSULT NOTE - Follow Up Consult  Pharmacy Consult for Heparin >> Xarelto Indication: splenic/renal infarcts  No Known Allergies  Patient Measurements: Height: 5' (152.4 cm) Weight: 112 lb 7 oz (51 kg) IBW/kg (Calculated) : 45.5  Vital Signs: Temp: 97.7 F (36.5 C) (02/24 0755) Temp Source: Oral (02/24 0755) BP: 159/72 (02/24 0755) Pulse Rate: 57 (02/24 0755)  Labs:  Recent Labs  10/12/16 1116 10/12/16 1723 10/13/16 0058 10/13/16 0805 10/14/16 0239  HGB 13.8  --  13.2  --  11.5*  HCT 41.1  --  38.6  --  34.1*  PLT 279  --  280  --  222  APTT  --  34  --   --   --   LABPROT  --  12.8  --   --   --   INR  --  0.96  --   --   --   HEPARINUNFRC  --   --  0.65 0.67 0.59  CREATININE 0.79  --  0.98  --  0.74    Estimated Creatinine Clearance: 43.6 mL/min (by C-G formula based on SCr of 0.74 mg/dL).  Assessment: 76 yo F previously discharged 08/27/2016 on Xarelto for afib (has been noncompliant with both Xarelto and diltiazem PTA - documented as not taking per "patient preference" in med rec). Now on heparin gtt for splenic / renal infarcts. Heparin level therapeutic at 0.59 after slight rate decrease. Hgb down 11.5, Plt wnl, no bleeding documented  Goal of Therapy:  Heparin level 0.3-0.7 units/ml Monitor platelets by anticoagulation protocol: Yes   Plan:  Continue heparin gtt at 800 units/hr Monitor daily heparin level, CBC, s/sx of bleed F/U PO anticoagulation plan  Mackie Paienee Silus Lanzo, PharmD PGY1 Pharmacy Resident Pager: [phone] 10/14/2016 10:15 AM   ADDENDUM: Pharmacy consulted to start Xarelto.  Plan: D/c heparin gtt Xarelto 15 mg PO BID for 21 days, then 20 mg daily with supper starting 3/18   Mackie Paienee Stepfon Rawles, PharmD PGY1 Pharmacy Resident Pager: [phone] 10/14/2016 12:11 PM

## 2016-10-14 NOTE — Progress Notes (Signed)
Pt not feeling well call made to language line - Pt told interpretor that she is dizzy - went into At fib at RATES OF 130'S. - Wants a ice bag for her headache Text page to Dr Joseph ArtWoods

## 2016-10-14 NOTE — Progress Notes (Signed)
   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 Randie Heinzain plans on following up in the office  Awaiting CTA Chest to look for proximal source  Subjective  No complaints  Objective Vitals:   10/14/16 0600 10/14/16 0700 10/14/16 0755 10/14/16 0811  BP: (!) 146/69 (!) 162/69 (!) 159/72   Pulse: (!) 56 (!) 47 (!) 57   Resp: 19 16 20    Temp:   97.7 F (36.5 C)   TempSrc:   Oral   SpO2: 97% 98% 100% 100%  Weight:      Height:         Intake/Output Summary (Last 24 hours) at 10/14/16 0912 Last data filed at 10/14/16 0600  Gross per 24 hour  Intake          3130.89 ml  Output             1225 ml  Net          1905.89 ml    GI  soft, NTND, no flank or CVAT TTP  Laboratory CBC    Component Value Date/Time   WBC 21.7 (H) 10/14/2016 0239   HGB 11.5 (L) 10/14/2016 0239   HCT 34.1 (L) 10/14/2016 0239   PLT 222 10/14/2016 0239    BMET    Component Value Date/Time   NA 140 10/14/2016 0239   K 4.4 10/14/2016 0239   CL 110 10/14/2016 0239   CO2 23 10/14/2016 0239   GLUCOSE 133 (H) 10/14/2016 0239   BUN 16 10/14/2016 0239   CREATININE 0.74 10/14/2016 0239   CALCIUM 8.8 (L) 10/14/2016 0239   GFRNONAA >60 10/14/2016 0239   GFRAA >60 10/14/2016 0239     Leonides SakeBrian Chen, MD, FACS [facility] Office: [phone] Pager: [phone]  10/14/2016, 9:12 AM

## 2016-10-14 NOTE — Plan of Care (Signed)
Problem: Pain Managment: Goal: General experience of comfort will improve Outcome: Progressing Discussed patient feeling better today and not complaining of abdominal pain.

## 2016-10-14 NOTE — Progress Notes (Signed)
Started Cardizem gtt at 10 ml/hr IV at Fib 148 BP 139/90

## 2016-10-14 NOTE — Progress Notes (Signed)
PROGRESS NOTE    Linda [name]  LKG:[id number] DOB: 1941/07/25 DOA: 10/12/2016 PCP: Nicki Reaper, NP   Brief Narrative:   Linda [name] is a 76 y.o. female [place] female, with medical history significant of atrial fibrillation last cardioverted 08/27/2016 in the emergency department and supposedly discharged on Cardizem and Xarelto with cardiology follow-up, history of coronary artery disease with EF of 60-65%, grade 2 diastolic dysfunction, mild-to-moderate aortic valvular regurgitation, mild mitral valvular regurgitation, mild-to-moderate tricuspid valvular regurgitation per 2-D echo 01/25/2016, hyperlipidemia, hypertension, ongoing tobacco abuse   Presents to the ED with diffuse abdominal pain radiating up to her central chest which started 2 weeks prior to admission. Patient stated she's had about 3 episodes of these which last approximately 2 hours 1 a week prior to admission and one the night prior to admission which has been the most severe and ongoing since last night. Patient endorses nausea, occasional shortness of breath, palpitations, generalized weakness, constipation, dizziness, decreased appetite. Patient denies any emesis, no fever, no chills, no melena, no hematemesis, no hematochezia, no dysuria, no lightheadedness, no blurry vision, no syncopal episode. Patient also with some complaints of left great toe pain ongoing for the past 5 days with some associated erythema. Patient does states she's been compliant with her medications however was never given any prescription for anticoagulation post cardioversion in January 2018. Patient does state did not take any of her medications today, day of admission. [facility] were called to admit the patient for further evaluation and management.  ED Course: Patient seen in the ED EKG done showed a normal sinus rhythm. CT abdomen and pelvis done showed peripheral wedge-shaped structures within the spleen consistent with splenic infarcts,  wedge shaped structures in the parenchyma of both kidneys consistent with parenchymal infarcts or possibly foci of pyelonephritis, significant abdominal aortic atherosclerotic change, focal of abnormal enhancement within the medial segment of the left lobe of the liver unchanged, gallstones. Urinalysis was nitrite negative leukocytes negative clear. Comprehensive metabolic profile had a glucose of 108 otherwise was unremarkable. Point-of-care troponin was negative. CBC had a white count of 12.6 otherwise was unremarkable. INR was 0.96. Chest x-ray negative for any acute abnormalities, cardiomegaly, aortic atherosclerosis. Patient also noted to have systolic blood pressures in the 200s.   Subjective: 2/24   A/O 4, positive headache, positive chest discomfort (palpitations). Positive SOB.   Assessment & Plan:   Principal Problem:   Splenic infarct Active Problems:   Atrial fibrillation (HCC)   CVA (cerebral vascular accident) (HCC)   Hyperlipidemia   Smoker   Impaired glucose tolerance   Chronic a-fib (HCC)   Generalized abdominal pain   Hypertensive emergency   Renal infarct (HCC)   Constipation   Gout attack: Probable left great toe   Leukocytosis   Splenic infarct/bilateral renal infarcts -Patient had presented with abdominal pain. CT abdomen and pelvis consistent with wedge-shaped infarcts of the spleen and bilateral kidneys. Etiology likely cardiogenic secondary to A. fib.  -Noncompliance with Anticoagulation after Cardioversion in January 2018. -Vascular surgery recommendations:  A. Echocardiogram, obtain see results below  B. Renal/Mesenteric Artery/ Duplex completed see results below  C. CTA pending Patient with multiple significant stenotic lesions. Still no thromboembolism source identified for infarcts. -2/24 spoke with NCM Freddy Jaksch concerning problem with patient noncompliance with her medications especially anticoagulation. Supposedly secondary to not being able to  afford. NCM Lin Givens is going to open case and address medication problem.  -Vascular surgery: Still awaiting CTA Chest before making final recommendations.  Chronic Diastolic CHF -Strict ins and outs since admission +2.8 L -Daily weight Filed Weights   10/12/16 2100 10/13/16 0158 10/14/16 0313  Weight: 50.2 kg (110 lb 10.7 oz) 50.2 kg (110 lb 10.7 oz) 51 kg (112 lb 7 oz)  -See hypertensive emergency  Hypertensive Emergency -Labetalol drip DC'd without notification, most likely when patient brought out from ED. -Start Hydralazine 10 mg q 4hr   Chronic Atrial fibrillation -Status post cardioversion 08/27/2016. Patient in normal sinus rhythm.  -Restart Xarelto per pharmacy. Marland KitchenNCM Freddy Jaksch, aware patient's medication issues and is addressing 2/24 ADDENDUM:~1846 Patient went back into A. fib with RVR. -DC PO Cardizem, restart Cardizem drip -Metoprolol IV 7 mg 1  Tobacco abuse -Tobacco cessation counseling and Placed on a nicotine patch.  Probable left great toe gout -2/22 Uric acid= 7.6.  Uric acid goal<6 -Prednisone 40 mg daily 5 days. Follow. -Prior to discharge start patient on colchicine.  Impaired glucose tolerance -2/23 Hemoglobin A1c = 6.1 -Sensitive SSI   Leukocytosis -Afebrile, negative left shift, negative bands most likely secondary to steroids + Demargination.   Hyperlipidemia -Lipid panel not within AHA guidelines, increase Lipitor to 60 mg daily   Hypokalemia -Potassium goal>4  Migraine Headache -Toradol IV 30 mg 1 -Cold packs to head PRN   DVT prophylaxis: Heparin drip--> Xarelto Code Status: Full Family Communication: None Disposition Plan:    Consultants:  Vascular surgery   Procedures/Significant Events:  2/23 Echocardiogram:Left ventricle: - mild concentric hypertrophy. -LVEF = 55% to 60%.  -(grade 2 diastolic dysfunction). 2/23 Renal Artery/Mesenteric Artery Duplex scan:- Bilateral renal arteries are patent with mildly elevated  velocities suggestive of less than 60% stenosis.  -Celiac artery and SMA demonstrated greater than 70% stenosis.   VENTILATOR SETTINGS:    Cultures   Antimicrobials: Anti-infectives    None       Devices    LINES / TUBES:      Continuous Infusions: . sodium chloride 75 mL/hr at 10/14/16 0706  . heparin 800 Units/hr (10/13/16 1753)     Objective: Vitals:   10/14/16 0700 10/14/16 0755 10/14/16 0811 10/14/16 1047  BP: (!) 162/69 (!) 159/72  (!) 183/68  Pulse: (!) 47 (!) 57    Resp: 16 20    Temp:  97.7 F (36.5 C)    TempSrc:  Oral    SpO2: 98% 100% 100%   Weight:      Height:        Intake/Output Summary (Last 24 hours) at 10/14/16 1055 Last data filed at 10/14/16 0600  Gross per 24 hour  Intake          2407.98 ml  Output             1025 ml  Net          1382.98 ml   Filed Weights   10/12/16 2100 10/13/16 0158 10/14/16 0313  Weight: 50.2 kg (110 lb 10.7 oz) 50.2 kg (110 lb 10.7 oz) 51 kg (112 lb 7 oz)    Examination:  General: A/O 4, Positive migraine headache, positive acute respiratory distress (SOB) Eyes: negative scleral hemorrhage, negative anisocoria, negative icterus ENT: Negative Runny nose, negative gingival bleeding, Neck:  Negative scars, masses, torticollis, lymphadenopathy, JVD Lungs: Clear to auscultation bilaterally without wheezes or crackles Cardiovascular: Irregular irregular rhythm and rate, without murmur gallop or rub normal S1 and S2 Abdomen: negative abdominal pain, nondistended, positive soft, bowel sounds, no rebound, no ascites, no appreciable mass Extremities: No significant cyanosis, clubbing, or edema  bilateral lower extremities Skin: Negative rashes, lesions, ulcers Psychiatric:  Negative depression, negative anxiety, negative fatigue, negative mania  Central nervous system:  Cranial nerves II through XII intact, tongue/uvula midline, all extremities muscle strength 5/5, sensation intact throughout,  negative  dysarthria, negative expressive aphasia, negative receptive aphasia.  .     Data Reviewed: Care during the described time interval was provided by me .  I have reviewed this patient's available data, including medical history, events of note, physical examination, and all test results as part of my evaluation. I have personally reviewed and interpreted all radiology studies.  CBC:  Recent Labs Lab 10/12/16 1116 10/13/16 0058 10/14/16 0239  WBC 12.6* 13.6* 21.7*  HGB 13.8 13.2 11.5*  HCT 41.1 38.6 34.1*  MCV 85.1 84.3 83.8  PLT 279 280 222   Basic Metabolic Panel:  Recent Labs Lab 10/12/16 1116 10/13/16 0058 10/14/16 0239  NA 139 137 140  K 3.7 3.3* 4.4  CL 104 103 110  CO2 25 21* 23  GLUCOSE 108* 295* 133*  BUN 12 13 16   CREATININE 0.79 0.98 0.74  CALCIUM 9.0 9.0 8.8*  MG  --  2.0 2.0   GFR: Estimated Creatinine Clearance: 43.6 mL/min (by C-G formula based on SCr of 0.74 mg/dL). Liver Function Tests:  Recent Labs Lab 10/12/16 1116 10/13/16 0058  AST 40 47*  ALT 24 31  ALKPHOS 61 66  BILITOT 0.4 0.6  PROT 7.5 7.1  ALBUMIN 3.8 3.4*    Recent Labs Lab 10/12/16 1116  LIPASE 35   No results for input(s): AMMONIA in the last 168 hours. Coagulation Profile:  Recent Labs Lab 10/12/16 1723  INR 0.96   Cardiac Enzymes: No results for input(s): CKTOTAL, CKMB, CKMBINDEX, TROPONINI in the last 168 hours. BNP (last 3 results) No results for input(s): PROBNP in the last 8760 hours. HbA1C:  Recent Labs  10/13/16 0058  HGBA1C 6.1*   CBG:  Recent Labs Lab 10/12/16 2125 10/13/16 0849 10/13/16 1246 10/13/16 1731 10/14/16 0833  GLUCAP 151* 152* 195* 235* 111*   Lipid Profile:  Recent Labs  10/13/16 0058  CHOL 207*  HDL 39*  LDLCALC 148*  TRIG 102  CHOLHDL 5.3   Thyroid Function Tests: No results for input(s): TSH, T4TOTAL, FREET4, T3FREE, THYROIDAB in the last 72 hours. Anemia Panel: No results for input(s): VITAMINB12, FOLATE,  FERRITIN, TIBC, IRON, RETICCTPCT in the last 72 hours. Urine analysis:    Component Value Date/Time   COLORURINE STRAW (A) 10/12/2016 1234   APPEARANCEUR CLEAR 10/12/2016 1234   LABSPEC 1.005 10/12/2016 1234   PHURINE 7.0 10/12/2016 1234   GLUCOSEU NEGATIVE 10/12/2016 1234   HGBUR NEGATIVE 10/12/2016 1234   BILIRUBINUR NEGATIVE 10/12/2016 1234   KETONESUR NEGATIVE 10/12/2016 1234   PROTEINUR NEGATIVE 10/12/2016 1234   NITRITE NEGATIVE 10/12/2016 1234   LEUKOCYTESUR NEGATIVE 10/12/2016 1234   Sepsis Labs: @LABRCNTIP (procalcitonin:4,lacticidven:4)  ) Recent Results (from the past 240 hour(s))  Urine culture     Status: None   Collection Time: 10/12/16 12:34 PM  Result Value Ref Range Status   Specimen Description URINE, CLEAN CATCH  Final   Special Requests NONE  Final   Culture NO GROWTH  Final   Report Status 10/13/2016 FINAL  Final  MRSA PCR Screening     Status: None   Collection Time: 10/12/16  8:50 PM  Result Value Ref Range Status   MRSA by PCR NEGATIVE NEGATIVE Final    Comment:        The  GeneXpert MRSA Assay (FDA approved for NASAL specimens only), is one component of a comprehensive MRSA colonization surveillance program. It is not intended to diagnose MRSA infection nor to guide or monitor treatment for MRSA infections.          Radiology Studies: Ct Abdomen Pelvis W Contrast  Result Date: 10/12/2016 CLINICAL DATA:  Abdominal pain, constipation EXAM: CT ABDOMEN AND PELVIS WITH CONTRAST TECHNIQUE: Multidetector CT imaging of the abdomen and pelvis was performed using the standard protocol following bolus administration of intravenous contrast. CONTRAST:  80 cc Isovue 300 COMPARISON:  CT abdomen pelvis of 04/18/2016 FINDINGS: Lower chest: The lung bases clear. Cardiomegaly is stable. No pericardial effusion is seen. Hepatobiliary: The focus of abnormally increased enhancement within the medial segment of the left lobe of liver described on the prior CT of  the abdomen has not changed. No other focal hepatic abnormality is seen. If further assessment is warranted, MRI of the liver could be performed. Gallstones layer within the gallbladder as noted previously. Pancreas: The pancreas is normal in size and the pancreatic duct is not dilated. Spleen: There are now multiple wedge-shaped low-attenuation structures extending to the periphery the spleen most consistent with splenic infarcts. Adrenals/Urinary Tract: The adrenal glands are unremarkable with a probable tiny adrenal adenoma on the right unchanged. The kidneys enhance and there are foci of abnormal profusion in both kidneys extending to the periphery worrisome for either pyelonephritis or focal renal infarction as well. No hydronephrosis is seen. The ureters are normal in caliber. The urinary bladder is unremarkable. Stomach/Bowel: The stomach is moderately distended with oral contrast. No abnormality is seen. No small bowel distention is seen. There is feces within the colon. No colonic mucosal edema is seen. The appendix and terminal ileum are unremarkable. Vascular/Lymphatic: There is significant abdominal aortic atherosclerosis present with plaque formation. Reproductive: The uterus is unremarkable. A left ovarian cyst is now present of approximately 3.1 cm on image 62 series 2. No free fluid is seen within the pelvis. Other: None. Musculoskeletal: The lumbar vertebrae are in normal alignment. Degenerative disc disease is present primarily at L3-4, L4-5, and L5-S1 levels. IMPRESSION: 1. Peripheral wedge-shaped low-attenuation structures within the spleen most consistent with splenic infarcts. 2. There are several peripheral low-attenuation wedge shaped structures in the parenchyma of both kidneys as well. These could represent parenchymal infarcts or possibly foci of pyelonephritis. Clinical correlation is recommended. 3. Significant abdominal aortic atherosclerotic change. 4. Left ovarian cyst of 3.1 cm. 5.  The focus of abnormal enhancement within the medial segment of the left lobe of liver is unchanged and of questionable significance. Consider MR if further assessment is warranted. 6. Gallstones. 7. Critical Value/emergent results were called by telephone at the time of interpretation on 10/12/2016 at 4:23 pm to [facility] , who verbally acknowledged these results. Electronically Signed   By: Dwyane DeePaul  Barry M.D.   On: 10/12/2016 16:23   Dg Chest Port 1 View  Result Date: 10/12/2016 CLINICAL DATA:  Atrial fibrillation and chest palpitations for several days. EXAM: PORTABLE CHEST 1 VIEW COMPARISON:  PA and lateral chest 08/27/2016 and 05/01/2016. FINDINGS: There is cardiomegaly without edema. Aortic atherosclerosis is noted. No pneumothorax or pleural fluid. No focal bony abnormality. IMPRESSION: No acute disease. Cardiomegaly. Aortic atherosclerosis. Electronically Signed   By: Drusilla Kannerhomas  Dalessio M.D.   On: 10/12/2016 13:26        Scheduled Meds: . aspirin  325 mg Oral Daily  . atorvastatin  40 mg Oral Daily  . diltiazem  240 mg Oral Daily  . insulin aspart  0-9 Units Subcutaneous TID WC  . mometasone-formoterol  2 puff Inhalation BID  . nicotine  7 mg Transdermal Daily  . pantoprazole  40 mg Oral Daily  . polyethylene glycol  17 g Oral BID  . predniSONE  40 mg Oral QAC breakfast  . senna-docusate  1 tablet Oral BID  . sodium chloride flush  3 mL Intravenous Q12H  . tiotropium  18 mcg Inhalation Daily   Continuous Infusions: . sodium chloride 75 mL/hr at 10/14/16 0706  . heparin 800 Units/hr (10/13/16 1753)     LOS: 2 days    Time spent: 40 minutes    WOODS, Roselind Messier, MD [facility] Pager [phone]   If 7PM-7AM, please contact night-coverage [email or web address] Password [facility] 10/14/2016, 10:55 AM

## 2016-10-15 LAB — GLUCOSE, CAPILLARY
GLUCOSE-CAPILLARY: 134 mg/dL — AB (ref 65–99)
Glucose-Capillary: 108 mg/dL — ABNORMAL HIGH (ref 65–99)
Glucose-Capillary: 155 mg/dL — ABNORMAL HIGH (ref 65–99)
Glucose-Capillary: 164 mg/dL — ABNORMAL HIGH (ref 65–99)

## 2016-10-15 LAB — BASIC METABOLIC PANEL
Anion gap: 10 (ref 5–15)
BUN: 13 mg/dL (ref 6–20)
CHLORIDE: 106 mmol/L (ref 101–111)
CO2: 21 mmol/L — ABNORMAL LOW (ref 22–32)
CREATININE: 0.7 mg/dL (ref 0.44–1.00)
Calcium: 8.7 mg/dL — ABNORMAL LOW (ref 8.9–10.3)
GFR calc non Af Amer: >60 mL/min (ref >60)
Glucose, Bld: 132 mg/dL — ABNORMAL HIGH (ref 65–99)
Potassium: 3.8 mmol/L (ref 3.5–5.1)
SODIUM: 137 mmol/L (ref 135–145)

## 2016-10-15 LAB — HEMOGLOBIN A1C
HEMOGLOBIN A1C: 6 % — AB (ref 4.8–5.6)
MEAN PLASMA GLUCOSE: 126 mg/dL

## 2016-10-15 LAB — MAGNESIUM: MAGNESIUM: 1.9 mg/dL (ref 1.7–2.4)

## 2016-10-15 MED ORDER — RIVAROXABAN 20 MG PO TABS: 20.0000 mg | ORAL_TABLET | Freq: Every day | ORAL | Status: DC

## 2016-10-15 MED ORDER — DILTIAZEM HCL 25 MG/5ML IV SOLN
10.0000 mg | Freq: Once | INTRAVENOUS | Status: AC
  Administered 2016-10-15: 10 mg via INTRAVENOUS
  Filled 2016-10-15: qty 5

## 2016-10-15 MED ORDER — DILTIAZEM HCL 100 MG IV SOLR
15.0000 mg/h | INTRAVENOUS | Status: DC
  Administered 2016-10-16: 5 mg/h via INTRAVENOUS
  Filled 2016-10-15: qty 100

## 2016-10-15 MED ORDER — ACETAMINOPHEN 325 MG PO TABS: 650.0000 mg | ORAL_TABLET | Freq: Four times a day (QID) | ORAL | Status: DC | PRN

## 2016-10-15 NOTE — Progress Notes (Signed)
PROGRESS NOTE    Linda [name]  ZOX:[id number] DOB: 1941/04/28 DOA: 10/12/2016 PCP: Nicki Reaper, NP   Brief Narrative:   Linda [name] is a 76 y.o. female [place] female, with medical history significant of atrial fibrillation last cardioverted 08/27/2016 in the emergency department and supposedly discharged on Cardizem and Xarelto with cardiology follow-up, history of coronary artery disease with EF of 60-65%, grade 2 diastolic dysfunction, mild-to-moderate aortic valvular regurgitation, mild mitral valvular regurgitation, mild-to-moderate tricuspid valvular regurgitation per 2-D echo 01/25/2016, hyperlipidemia, hypertension, ongoing tobacco abuse   Presents to the ED with diffuse abdominal pain radiating up to her central chest which started 2 weeks prior to admission. Patient stated she's had about 3 episodes of these which last approximately 2 hours 1 a week prior to admission and one the night prior to admission which has been the most severe and ongoing since last night. Patient endorses nausea, occasional shortness of breath, palpitations, generalized weakness, constipation, dizziness, decreased appetite. Patient denies any emesis, no fever, no chills, no melena, no hematemesis, no hematochezia, no dysuria, no lightheadedness, no blurry vision, no syncopal episode. Patient also with some complaints of left great toe pain ongoing for the past 5 days with some associated erythema. Patient does states she's been compliant with her medications however was never given any prescription for anticoagulation post cardioversion in January 2018. Patient does state did not take any of her medications today, day of admission. [facility] were called to admit the patient for further evaluation and management.  ED Course: Patient seen in the ED EKG done showed a normal sinus rhythm. CT abdomen and pelvis done showed peripheral wedge-shaped structures within the spleen consistent with splenic infarcts,  wedge shaped structures in the parenchyma of both kidneys consistent with parenchymal infarcts or possibly foci of pyelonephritis, significant abdominal aortic atherosclerotic change, focal of abnormal enhancement within the medial segment of the left lobe of the liver unchanged, gallstones. Urinalysis was nitrite negative leukocytes negative clear. Comprehensive metabolic profile had a glucose of 108 otherwise was unremarkable. Point-of-care troponin was negative. CBC had a white count of 12.6 otherwise was unremarkable. INR was 0.96. Chest x-ray negative for any acute abnormalities, cardiomegaly, aortic atherosclerosis. Patient also noted to have systolic blood pressures in the 200s.   Subjective: Used the interpreter line. [place] later ID #[id number] Patient states she does not any complaints this morning except mild nonspecific generalized abdominal pain. She tells me that she never received a prescription for Xarelto therefore she never took it. But she is compliant with her other medications.  Assessment & Plan:   Principal Problem:   Splenic infarct Active Problems:   Atrial fibrillation (HCC)   CVA (cerebral vascular accident) (HCC)   Hyperlipidemia   Smoker   Impaired glucose tolerance   Chronic a-fib (HCC)   Generalized abdominal pain   Hypertensive emergency   Renal infarct (HCC)   Constipation   Gout attack: Probable left great toe   Leukocytosis   Tobacco abuse   Chronic diastolic CHF (congestive heart failure) (HCC)   Other hyperlipidemia   Atrial fibrillation with RVR (HCC)   Thromboembolism (HCC)   Splenic infarct/bilateral renal infarcts -Patient had presented with abdominal pain. CT abdomen and pelvis consistent with wedge-shaped infarcts of the spleen and bilateral kidneys. Etiology likely cardiogenic secondary to A. fib.  -Noncompliance with Anticoagulation after Cardioversion in January 2018. -Vascular surgery recommendations:  A. Echocardiogram, obtain see  results below  B. Renal/Mesenteric Artery/ Duplex completed see results below  C.  CTA -4.1 cm ascending thoracic aortic aneurysm without any dissection, recommend one-year follow-up with a repeat scan. Mild diffuse calcified and noncalcified atherosclerotic plaque throughout the aorta. -Vascular surgery-recommend outpatient workup and follow-up with Dr. Randie Heinzcain  Chronic Diastolic CHF -Strict ins and out -Daily weight Filed Weights   10/13/16 0158 10/14/16 0313 10/15/16 0346  Weight: 50.2 kg (110 lb 10.7 oz) 51 kg (112 lb 7 oz) 50.9 kg (112 lb 3.4 oz)  -See hypertensive emergency  Hypertensive Emergency -Resolved  Chronic Atrial fibrillation -Status post cardioversion 08/27/2016. Patient in normal sinus rhythm.  -Patient was started on Cardizem drip last night as she was in A. fib with RVR converted to normal sinus rhythm this morning. -We'll discontinue Cardizem drip for now and monitor her. Start AV nodal blockers if needed -Continue Xarelto for now  Tobacco abuse -Tobacco cessation counseling and Placed on a nicotine patch.  Probable left great toe gout -2/22 Uric acid= 7.6.  Uric acid goal<6 -Prednisone 40 mg daily 5 days. Follow. -Prior to discharge start patient on colchicine.  Impaired glucose tolerance -2/23 Hemoglobin A1c = 6.1 -Sensitive SSI   Leukocytosis -Afebrile, negative left shift, negative bands most likely secondary to steroids + Demargination.   Hyperlipidemia -Lipid panel not within AHA guidelines, increase Lipitor to 60 mg daily   Hypokalemia -Potassium goal>4  Migraine Headache -Toradol IV 30 mg 1 -Cold packs to head PRN   DVT prophylaxis: Heparin drip--> Xarelto Code Status: Full Family Communication: None Disposition Plan:    Consultants:  Vascular surgery   Procedures/Significant Events:  2/23 Echocardiogram:Left ventricle: - mild concentric hypertrophy. -LVEF = 55% to 60%.  -(grade 2 diastolic dysfunction). 2/23 Renal  Artery/Mesenteric Artery Duplex scan:- Bilateral renal arteries are patent with mildly elevated velocities suggestive of less than 60% stenosis.  -Celiac artery and SMA demonstrated greater than 70% stenosis.    Antimicrobials: Anti-infectives    None        Continuous Infusions: . diltiazem (CARDIZEM) infusion Stopped (10/15/16 1100)     Objective: Vitals:   10/15/16 0700 10/15/16 0800 10/15/16 0920 10/15/16 1341  BP: (!) 121/56 (!) 110/38  (!) 148/68  Pulse: 62 (!) 54    Resp: 15 (!) 24    Temp:      TempSrc:      SpO2: 99% 98% 98%   Weight:      Height:        Intake/Output Summary (Last 24 hours) at 10/15/16 1349 Last data filed at 10/15/16 1130  Gross per 24 hour  Intake          1685.62 ml  Output             1800 ml  Net          -114.38 ml   Filed Weights   10/13/16 0158 10/14/16 0313 10/15/16 0346  Weight: 50.2 kg (110 lb 10.7 oz) 51 kg (112 lb 7 oz) 50.9 kg (112 lb 3.4 oz)    Examination:  General: A/O 4, Positive migraine headache, positive acute respiratory distress (SOB) Eyes: negative scleral hemorrhage, negative anisocoria, negative icterus ENT: Negative Runny nose, negative gingival bleeding, Neck:  Negative scars, masses, torticollis, lymphadenopathy, JVD Lungs: Clear to auscultation bilaterally without wheezes or crackles Cardiovascular: Irregular irregular rhythm and rate, without murmur gallop or rub normal S1 and S2 Abdomen: negative abdominal pain, nondistended, positive soft, bowel sounds, no rebound, no ascites, no appreciable mass Extremities: No significant cyanosis, clubbing, or edema bilateral lower extremities Skin: Negative rashes, lesions, ulcers  Psychiatric:  Negative depression, negative anxiety, negative fatigue, negative mania  Central nervous system:  Cranial nerves II through XII intact, tongue/uvula midline, all extremities muscle strength 5/5, sensation intact throughout,  negative dysarthria, negative expressive aphasia,  negative receptive aphasia.  .     Data Reviewed: Care during the described time interval was provided by me .  I have reviewed this patient's available data, including medical history, events of note, physical examination, and all test results as part of my evaluation. I have personally reviewed and interpreted all radiology studies.  CBC:  Recent Labs Lab 10/12/16 1116 10/13/16 0058 10/14/16 0239 10/14/16 1340  WBC 12.6* 13.6* 21.7* 21.1*  NEUTROABS  --   --   --  18.5*  HGB 13.8 13.2 11.5* 13.2  HCT 41.1 38.6 34.1* 38.1  MCV 85.1 84.3 83.8 83.9  PLT 279 280 222 247   Basic Metabolic Panel:  Recent Labs Lab 10/12/16 1116 10/13/16 0058 10/14/16 0239 10/15/16 0323  NA 139 137 140 137  K 3.7 3.3* 4.4 3.8  CL 104 103 110 106  CO2 25 21* 23 21*  GLUCOSE 108* 295* 133* 132*  BUN 12 13 16 13   CREATININE 0.79 0.98 0.74 0.70  CALCIUM 9.0 9.0 8.8* 8.7*  MG  --  2.0 2.0 1.9   GFR: Estimated Creatinine Clearance: 43.6 mL/min (by C-G formula based on SCr of 0.7 mg/dL). Liver Function Tests:  Recent Labs Lab 10/12/16 1116 10/13/16 0058  AST 40 47*  ALT 24 31  ALKPHOS 61 66  BILITOT 0.4 0.6  PROT 7.5 7.1  ALBUMIN 3.8 3.4*    Recent Labs Lab 10/12/16 1116  LIPASE 35   No results for input(s): AMMONIA in the last 168 hours. Coagulation Profile:  Recent Labs Lab 10/12/16 1723  INR 0.96   Cardiac Enzymes: No results for input(s): CKTOTAL, CKMB, CKMBINDEX, TROPONINI in the last 168 hours. BNP (last 3 results) No results for input(s): PROBNP in the last 8760 hours. HbA1C:  Recent Labs  10/13/16 0058 10/14/16 1116  HGBA1C 6.1* 6.0*   CBG:  Recent Labs Lab 10/14/16 0833 10/14/16 1229 10/14/16 1702 10/14/16 2038 10/15/16 0835  GLUCAP 111* 160* 161* 220* 108*   Lipid Profile:  Recent Labs  10/13/16 0058  CHOL 207*  HDL 39*  LDLCALC 148*  TRIG 102  CHOLHDL 5.3   Thyroid Function Tests: No results for input(s): TSH, T4TOTAL, FREET4,  T3FREE, THYROIDAB in the last 72 hours. Anemia Panel: No results for input(s): VITAMINB12, FOLATE, FERRITIN, TIBC, IRON, RETICCTPCT in the last 72 hours. Urine analysis:    Component Value Date/Time   COLORURINE STRAW (A) 10/12/2016 1234   APPEARANCEUR CLEAR 10/12/2016 1234   LABSPEC 1.005 10/12/2016 1234   PHURINE 7.0 10/12/2016 1234   GLUCOSEU NEGATIVE 10/12/2016 1234   HGBUR NEGATIVE 10/12/2016 1234   BILIRUBINUR NEGATIVE 10/12/2016 1234   KETONESUR NEGATIVE 10/12/2016 1234   PROTEINUR NEGATIVE 10/12/2016 1234   NITRITE NEGATIVE 10/12/2016 1234   LEUKOCYTESUR NEGATIVE 10/12/2016 1234   Sepsis Labs: @LABRCNTIP (procalcitonin:4,lacticidven:4)  ) Recent Results (from the past 240 hour(s))  Urine culture     Status: None   Collection Time: 10/12/16 12:34 PM  Result Value Ref Range Status   Specimen Description URINE, CLEAN CATCH  Final   Special Requests NONE  Final   Culture NO GROWTH  Final   Report Status 10/13/2016 FINAL  Final  MRSA PCR Screening     Status: None   Collection Time: 10/12/16  8:50 PM  Result Value Ref Range Status   MRSA by PCR NEGATIVE NEGATIVE Final    Comment:        The GeneXpert MRSA Assay (FDA approved for NASAL specimens only), is one component of a comprehensive MRSA colonization surveillance program. It is not intended to diagnose MRSA infection nor to guide or monitor treatment for MRSA infections.          Radiology Studies: Ct Angio Chest Aorta W/cm &/or Wo/cm  Result Date: 10/15/2016 CLINICAL DATA:  Chest pain. Thromboembolism with spleen and renal infarcts. EXAM: CT ANGIOGRAPHY CHEST WITH CONTRAST TECHNIQUE: Multidetector CT imaging of the chest was performed using the standard protocol during bolus administration of intravenous contrast. Multiplanar CT image reconstructions and MIPs were obtained to evaluate the vascular anatomy. CONTRAST:  80 mL Isovue 370 COMPARISON:  None. FINDINGS: Cardiovascular: No evidence thoracic aortic  dissection. 4.1 cm ascending thoracic aortic aneurysm. Mild diffuse calcified and noncalcified atherosclerotic plaque seen throughout the thoracic aortic, without evidence of penetrating ulcer stool and mediastinal hematoma. Mild cardiomegaly, without pericardial effusion. Pulmonary arteries are well opacified, and no acute pulmonary embolism visualized. Mediastinum/Nodes: No masses or pathologically enlarged lymph nodes identified. Lungs/Pleura: No pulmonary mass, infiltrate, or effusion. Upper abdomen: Cholelithiasis again noted. Musculoskeletal: No suspicious bone lesions or other significant abnormality identified. Review of the MIP images confirms the above findings. IMPRESSION: 4.1 cm ascending thoracic aortic aneurysm. No evidence of thoracic aortic dissection. Recommend annual imaging followup by CTA or MRA. This recommendation follows 2010 ACCF/AHA/AATS/ACR/ASA/SCA/SCAI/SIR/STS/SVM Guidelines for the Diagnosis and Management of Patients with Thoracic Aortic Disease. Circulation. 2010; 121: W098-J191 Mild diffuse calcified and non calcified atherosclerotic plaque throughout thoracic aorta. Mild cardiomegaly.  No acute findings. Electronically Signed   By: Myles Rosenthal M.D.   On: 10/15/2016 07:26        Scheduled Meds: . aspirin  325 mg Oral Daily  . atorvastatin  60 mg Oral Daily  . hydrALAZINE  10 mg Intravenous Q4H  . insulin aspart  0-9 Units Subcutaneous TID WC  . mometasone-formoterol  2 puff Inhalation BID  . nicotine  7 mg Transdermal Daily  . pantoprazole  40 mg Oral Daily  . polyethylene glycol  17 g Oral BID  . predniSONE  40 mg Oral QAC breakfast  . Rivaroxaban  15 mg Oral BID WC  . [START ON 11/05/2016] rivaroxaban  20 mg Oral Q supper  . senna-docusate  1 tablet Oral BID  . sodium chloride flush  3 mL Intravenous Q12H  . tiotropium  18 mcg Inhalation Daily   Continuous Infusions: . diltiazem (CARDIZEM) infusion Stopped (10/15/16 1100)     LOS: 3 days    Time spent: 40  minutes    Lashunta Frieden Joline Maxcy, MD [facility] Pager [phone]   If 7PM-7AM, please contact night-coverage [email or web address] Password [email or web address] 10/15/2016, 1:49 PM

## 2016-10-15 NOTE — Progress Notes (Signed)
Called and spoke to Pt's grand daughter named Farrel Connersram to make rest of family that her grandma is being move to 3 west bed 11 per bed with all belongings.

## 2016-10-15 NOTE — Progress Notes (Signed)
   Daily Progress Note  Radiology: Ct Angio Chest Aorta W/cm &/or Wo/cm  Result Date: 10/15/2016 CLINICAL DATA:  Chest pain. Thromboembolism with spleen and renal infarcts. EXAM: CT ANGIOGRAPHY CHEST WITH CONTRAST TECHNIQUE: Multidetector CT imaging of the chest was performed using the standard protocol during bolus administration of intravenous contrast. Multiplanar CT image reconstructions and MIPs were obtained to evaluate the vascular anatomy. CONTRAST:  80 mL Isovue 370 COMPARISON:  None. FINDINGS: Cardiovascular: No evidence thoracic aortic dissection. 4.1 cm ascending thoracic aortic aneurysm. Mild diffuse calcified and noncalcified atherosclerotic plaque seen throughout the thoracic aortic, without evidence of penetrating ulcer stool and mediastinal hematoma. Mild cardiomegaly, without pericardial effusion. Pulmonary arteries are well opacified, and no acute pulmonary embolism visualized. Mediastinum/Nodes: No masses or pathologically enlarged lymph nodes identified. Lungs/Pleura: No pulmonary mass, infiltrate, or effusion. Upper abdomen: Cholelithiasis again noted. Musculoskeletal: No suspicious bone lesions or other significant abnormality identified. Review of the MIP images confirms the above findings. IMPRESSION: 4.1 cm ascending thoracic aortic aneurysm. No evidence of thoracic aortic dissection. Recommend annual imaging followup by CTA or MRA. This recommendation follows 2010 ACCF/AHA/AATS/ACR/ASA/SCA/SCAI/SIR/STS/SVM Guidelines for the Diagnosis and Management of Patients with Thoracic Aortic Disease. Circulation. 2010; 121: [id number] Mild diffuse calcified and non calcified atherosclerotic plaque throughout thoracic aorta. Mild cardiomegaly.  No acute findings. Electronically Signed   By: Myles RosenthalJohn  Stahl M.D.   On: 10/15/2016 07:26   I reviewed this CTA and there is no obvious plaque or mass that might account for the possible R renal infarcts. Pt has the same calcific atherosclerosis since her  abdominal segment.  - Pt can follow up with Dr. Randie Heinzain as an outpatient to follow up on the mesenteric duplex findings. - He is planning on repeating the studies at that time.   Leonides SakeBrian Nehemias Sauceda, MD, FACS [facility] Office: [phone] Pager: [phone]  10/15/2016, 8:12 AM

## 2016-10-15 NOTE — Progress Notes (Signed)
Pt transferred to 3 west bed 11 per bed with all personal belongings

## 2016-10-15 NOTE — Progress Notes (Signed)
Report called to [address] prior to transfer. Spoke to [employer] Samantha           RN

## 2016-10-15 NOTE — Plan of Care (Signed)
Problem: Education: Goal: Knowledge of [facility] Education information/materials will improve Outcome: Progressing Discussed with patient, family and doctor about lab values (WBC 21.7) and CT Angio with some teach back displayed.  The family did mention her previously feeling numbness in her leg prior to admission.

## 2016-10-16 ENCOUNTER — Telehealth: Payer: Self-pay

## 2016-10-16 ENCOUNTER — Other Ambulatory Visit: Payer: Self-pay | Admitting: *Deleted

## 2016-10-16 DIAGNOSIS — I4891 Unspecified atrial fibrillation: Secondary | ICD-10-CM

## 2016-10-16 DIAGNOSIS — Z9862 Peripheral vascular angioplasty status: Secondary | ICD-10-CM

## 2016-10-16 DIAGNOSIS — D72828 Other elevated white blood cell count: Secondary | ICD-10-CM

## 2016-10-16 DIAGNOSIS — N280 Ischemia and infarction of kidney: Secondary | ICD-10-CM

## 2016-10-16 DIAGNOSIS — D735 Infarction of spleen: Principal | ICD-10-CM

## 2016-10-16 DIAGNOSIS — K551 Chronic vascular disorders of intestine: Secondary | ICD-10-CM

## 2016-10-16 LAB — CBC
HEMATOCRIT: 42 % (ref 36.0–46.0)
HEMOGLOBIN: 14.2 g/dL (ref 12.0–15.0)
MCH: 28.9 pg (ref 26.0–34.0)
MCHC: 33.8 g/dL (ref 30.0–36.0)
MCV: 85.4 fL (ref 78.0–100.0)
Platelets: 291 10*3/uL (ref 150–400)
RBC: 4.92 MIL/uL (ref 3.87–5.11)
RDW: 13.4 % (ref 11.5–15.5)
WBC: 20 10*3/uL — AB (ref 4.0–10.5)

## 2016-10-16 LAB — BASIC METABOLIC PANEL
ANION GAP: 10 (ref 5–15)
BUN: 14 mg/dL (ref 6–20)
CALCIUM: 9 mg/dL (ref 8.9–10.3)
CO2: 24 mmol/L (ref 22–32)
Chloride: 106 mmol/L (ref 101–111)
Creatinine, Ser: 0.92 mg/dL (ref 0.44–1.00)
GFR calc non Af Amer: 59 mL/min — ABNORMAL LOW (ref >60)
Glucose, Bld: 92 mg/dL (ref 65–99)
Potassium: 3.8 mmol/L (ref 3.5–5.1)
SODIUM: 140 mmol/L (ref 135–145)

## 2016-10-16 LAB — GLUCOSE, CAPILLARY
GLUCOSE-CAPILLARY: 80 mg/dL (ref 65–99)
Glucose-Capillary: 100 mg/dL — ABNORMAL HIGH (ref 65–99)

## 2016-10-16 LAB — MAGNESIUM: MAGNESIUM: 2 mg/dL (ref 1.7–2.4)

## 2016-10-16 MED ORDER — DILTIAZEM HCL ER COATED BEADS 240 MG PO CP24: 240.0000 mg | ORAL_CAPSULE | Freq: Every day | ORAL | 0 refills | Status: DC

## 2016-10-16 MED ORDER — RIVAROXABAN (XARELTO) VTE STARTER PACK (15 & 20 MG): ORAL_TABLET | ORAL | 0 refills | Status: DC

## 2016-10-16 MED ORDER — DILTIAZEM HCL ER COATED BEADS 240 MG PO CP24
240.0000 mg | ORAL_CAPSULE | Freq: Every day | ORAL | Status: DC
  Administered 2016-10-16: 240 mg via ORAL
  Filled 2016-10-16: qty 1

## 2016-10-16 MED ORDER — RIVAROXABAN 20 MG PO TABS: 20.0000 mg | ORAL_TABLET | Freq: Every day | ORAL | 0 refills | Status: DC

## 2016-10-16 MED ORDER — COLCHICINE 0.6 MG PO TABS: 0.6000 mg | ORAL_TABLET | Freq: Every day | ORAL | 0 refills | Status: DC

## 2016-10-16 NOTE — Progress Notes (Signed)
Pt left hospital at this time.  CM set pt up with month of  xarelto and provider appt.  Family aware.

## 2016-10-16 NOTE — Care Management Note (Addendum)
Case Management Note  Patient Details  Name: Mignon PineDoan T Budde MRN: [id number] Date of Birth: 30-Sep-1940  Subjective/Objective:  Pt presented for medical history significant of atrial fibrillation. Noncompliant with medications. CM did call [facility] [place] and medication cost will be 84.33. Interpreter in room at this time and told pt the cost. Pt is not able to afford medication at this cost. CM did provide pt with the 30 day free card.  Interpreter not able to stay-states she has a scheduled appointment she has to get to. CM did reach out to Dr. Ronalee BeltsBhandari to make him aware that pt is unable to afford her medication.   Action/Plan: Pt is without PCP- CM did call the [facility] Liaison to see the patient in regards to Hospital follow up and PCP needs. Pt will be able to utilize the pharmacy onsite. If pt is appropriate for TCC pt will have a CSW/ Pharmacy to assist with the patient assistance application form to see if pt qualifies for assistance for Xarelto. TCC to help with transportation as well. No further needs from CM at this time.   Expected Discharge Date:  10/16/16               Expected Discharge Plan:  Home/Self Care  In-House Referral:  Interpreting Services  Discharge planning Services  CM Consult, Medication Assistance, Follow-up appt scheduled, [facility]  Post Acute Care Choice:  NA Choice offered to:  NA  DME Arranged:  N/A DME Agency:  NA  HH Arranged:  NA HH Agency:  NA  Status of Service:  Completed, signed off  If discussed at Long Length of Stay Meetings, dates discussed:    Additional Comments:  Gala LewandowskyGraves-Bigelow, Galan Ghee Kaye, RN 10/16/2016, 2:36 PM

## 2016-10-16 NOTE — Telephone Encounter (Signed)
Transitional Care Clinic Care Coordination Note:  Admit date:  10/12/2016 Discharge date: 10/16/2016 Discharge Disposition: home Patient contact:the patient provided her daughter ( Chi's)  # [phone] - [phone]. Chi speaks [place]  Emergency contact(s): granddaughter, Farrel Conners # [phone].  She speaks English    This CM met with the patient with the assistance of Thay Thuy, [facility] Pharmacist who speaks [place] and was able to interpret.  This Case Manager reviewed patient's EMR and determined patient would benefit from post-discharge medical management and chronic care management services through the Transitional Care Clinic. Patient has a history of atrial fibrillation - s/p cardioversion 08/2016, CAD, CVA, and chronic diastolic congestive heart failure.She has 2 hospital admissions and 1 ED visit in the past 8 months.  She was admitted with abdominal pain and has been treated for a splenic infarct and bilateral renal infarcts.  She was also treated for Atrial fibrillation with RVR and with a cardizem drip. This Case Manager met with patient to discuss the services and medical management that can be provided at the [facility]. Patient verbalized understanding and agreed to receive post-discharge care at the [facility].   Patient scheduled for Transitional Care appointment on 10/20/16 @ 1400.  Clinic information and appointment time provided to patient. Appointment information also placed on AVS.  Assessment:       Home Environment: lives with her daughter who has been sick and out of work for a year.        Support System: her daughter and a grand daughter ( she does not live with the patient)       Level of functioning: independent       Home DME: none at present time. She said that she has been given a RW to take home from the hospital        Home care services: (services arranged prior to discharge or new services after discharge) none       Transportation:  She and her family do not drive.  They currently rely on friends to drive them to appointments and wherever they need to go.        Food/Nutrition: (ability to afford, access, use of any community resources) She has access to food but noted that it is expensive as she pays for the food for her daughter as well as help with rent and utility payments.  She said that her daughter has no income and she ( patient) only receives about $600/month from [employer].         Medications: (ability to afford, access, compliance, Pharmacy used) She has [employer] and noted that her medication co pays can run from $9.00-15.00.  She said that it can be difficult to afford the medications with her limited income and responsibility for other household bills.         Identified Barriers: lack of transportation, no PCP, difficulty affording medications which can contribute to non - compliance and re-hospitalization, extra income is used to pay for her daughter's bills.         PCP (Name, office location, phone number): She had a PCP in [facility] but does not want to return and at this time will plan to establish care at [facility] after her TCC episode.   Patient Education: explained to patient about the services provided at [facility] including pharmacy assistance, social work as well as primary care. Provided the patient with the address/ phone # for DSS as she is interested in  submitting a medicaid application.  Also provided her with the phone # for [place] [employer] for her daughter.  Explained to her that cab transportation can be arranged for her to the clinic appointments during her TCC period if needed. She can also receive assistance with completing a SCAT application if needed.              Services communicated to Jacqlyn Krauss, RN CM

## 2016-10-16 NOTE — Care Management Important Message (Signed)
Important Message  Patient Details  Name: Linda [name] MRN: [id number] Date of Birth: [birth year]   Medicare Important Message Given:  Yes    Kyla BalzarineShealy, Yasir Kitner Abena 10/16/2016, 1:35 PM

## 2016-10-16 NOTE — Progress Notes (Signed)
DC instructions given at this time.  Interpreter at bedside assisting.  Pt verbalized understanding re: all medications (especially blood thinners) and other dc instructions.  No s/s of any acute distress.  Dr. Ronalee BeltsBhandari called and states, "cm consult to assist with getting medications."  Notified CM who asked me to [facility].  Pt and interpreter waiting in room on case manager to give her the details.

## 2016-10-16 NOTE — Discharge Instructions (Signed)
Information on my medicine - XARELTO (rivaroxaban)  This medication education was reviewed with me or my healthcare representative as part of my discharge preparation.  The pharmacist that spoke with me during my hospital stay was:  Lennon AlstromDang, Ariyanah Aguado Dien, [facility] Hospital[facility]PH  WHY WAS Linda HurlXARELTO PRESCRIBED FOR YOU? Xarelto was prescribed to treat blood clots that may have been found in the veins of your legs (deep vein thrombosis) or in your lungs (pulmonary embolism) and to reduce the risk of them occurring again.  What do you need to know about Xarelto? The starting dose is one 15 mg tablet taken TWICE daily with food for the FIRST 21 DAYS then on (enter date)  11/05/16  the dose is changed to one 20 mg tablet taken ONCE A DAY with your evening meal.  DO NOT stop taking Xarelto without talking to the health care provider who prescribed the medication.  Refill your prescription for 20 mg tablets before you run out.  After discharge, you should have regular check-up appointments with your healthcare provider that is prescribing your Xarelto.  In the future your dose may need to be changed if your kidney function changes by a significant amount.  What do you do if you miss a dose? If you are taking Xarelto TWICE DAILY and you miss a dose, take it as soon as you remember. You may take two 15 mg tablets (total 30 mg) at the same time then resume your regularly scheduled 15 mg twice daily the next day.  If you are taking Xarelto ONCE DAILY and you miss a dose, take it as soon as you remember on the same day then continue your regularly scheduled once daily regimen the next day. Do not take two doses of Xarelto at the same time.   Important Safety Information Xarelto is a blood thinner medicine that can cause bleeding. You should call your healthcare provider right away if you experience any of the following: ? Bleeding from an injury or your nose that does not stop. ? Unusual colored urine (red or dark brown) or  unusual colored stools (red or black). ? Unusual bruising for unknown reasons. ? A serious fall or if you hit your head (even if there is no bleeding).  Some medicines may interact with Xarelto and might increase your risk of bleeding while on Xarelto. To help avoid this, consult your healthcare provider or pharmacist prior to using any new prescription or non-prescription medications, including herbals, vitamins, non-steroidal anti-inflammatory drugs (NSAIDs) and supplements.  This website has more information on Xarelto: [email or web address].

## 2016-10-16 NOTE — Discharge Summary (Addendum)
Physician Discharge Summary  Linda [name] T Narramore ZOX:[id number] DOB: 09-02-1940 DOA: 10/12/2016  PCP: Nicki ReaperBAITY, REGINA, NP  Admit date: 10/12/2016 Discharge date: 10/16/2016  Admitted From:Home Disposition:Home  Recommendations for Outpatient Follow-up:  1. Follow up with PCP in 1-2 weeks 2. Please obtain BMP/CBC in one week 3. Please follow up with Vascular surgeon Dr Randie Heinzain in 1-2 weeks.   Home Health:no Equipment/Devices:no Discharge Condition:stable CODE STATUS:full Diet recommendation:heart healthy  Brief/Interim Summary:76 y.o.femaleVietnamese female, with medical history significant of atrial fibrillation last cardioverted 08/27/2016 in the emergency department and supposedly discharged on Cardizem and Xarelto with cardiology follow-up,history of coronary artery disease with EF of 60-65%, grade 2 diastolic dysfunction, mild-to-moderate aortic valvular regurgitation, mild mitral valvular regurgitation, mild-to-moderate tricuspid valvular regurgitation per 2-D echo 01/25/2016, hyperlipidemia, hypertension, ongoing tobacco abuse who presented to the ED with diffuse abdominal pain radiating up to her central chest which started 2 weeks prior to admission.  In the ED, EKG done showed a normal sinus rhythm. CT abdomen and pelvis done showed peripheral wedge-shaped structures within the spleen consistent with splenic infarcts, wedge shaped structures in the parenchyma of both kidneys consistent with parenchymal infarcts   Please see below for detail:  #Splenic infarct/bilateral renal infarct: Patient presented with abdominal pain. CT scan of abdomen pelvis consistent with infarction in the kidneys and spleen. Probably in the setting of atrial fibrillation. She was supposed to be on xarelto at home but she was not taking anticoagulation at home. Patient was evaluated by vascular surgery. CT angiograph was obtained which was consistent with 4.1 cm ascending thoracic aorta aneurysm without any dissection  recommended one-year follow-up for repeat CT scan. As per vascular surgery patient can follow up with Dr. Randie Heinzain as an outpatient. Patient is clinically improved. Denied headache, nausea, fever, abdominal pain. Kidney function normal. -Education provided regarding to take medications including anticoagulation.  -I called patient's grandson and reviewed the discharge planning in detail. -Patient is on systemic anticoagulation and a statin.  #Atrial fibrillation with RVR in the hospital required a Cardizem drip. Patient is off of Cardizem drip now. Heart rate better controlled. Converted to sinus rhythm. Prescription provided for Xarelto and diltiazem orally. Educated to take medications daily at home and monitor heart rate and blood pressure closely with outpatient follow-ups. -Discussed with the case manager today to arrange for the medications including Xarelto at home. As per CM, she provided 30 days of free xarelto card and then working on helping patient and family to find PCP. I discussed with the patient's grandson to have patient follow up with PCP.  #Portable left great toe gout treated with prednisone in the hospital with clinical improvement. Discharge with oral colchicine and recommend outpatient follow-up.  #Tobacco abuse: Education provided regarding tobacco cessation.  #Leukocytosis likely reactive in the setting of steroid. Patient is afebrile and cultures negative.  Patient clinically improved. Denied any symptoms. No headache, dizziness, nausea, vomiting, chest and, shortness of breath or abdominal pain. I called and extensively discussed with the patient's grandson regarding discharge plan and medications.  Discharge Diagnoses:  Principal Problem:   Splenic infarct Active Problems:   Atrial fibrillation (HCC)   CVA (cerebral vascular accident) (HCC)   Hyperlipidemia   Smoker   Impaired glucose tolerance   Chronic a-fib (HCC)   Generalized abdominal pain   Hypertensive  emergency   Renal infarct (HCC)   Constipation   Gout attack: Probable left great toe   Leukocytosis   Tobacco abuse   Chronic diastolic CHF (congestive heart failure) (  HCC)   Other hyperlipidemia   Atrial fibrillation with RVR (HCC)   Thromboembolism [facility])    Discharge Instructions  Discharge Instructions    Call MD for:  difficulty breathing, headache or visual disturbances    Complete by:  As directed    Call MD for:  extreme fatigue    Complete by:  As directed    Call MD for:  hives    Complete by:  As directed    Call MD for:  persistant dizziness or light-headedness    Complete by:  As directed    Call MD for:  persistant nausea and vomiting    Complete by:  As directed    Call MD for:  severe uncontrolled pain    Complete by:  As directed    Call MD for:  temperature >100.4    Complete by:  As directed    Diet - low sodium heart healthy    Complete by:  As directed    Discharge instructions    Complete by:  As directed    1) please take medications including xarelto, diltiazem and other home medications, please monitor HR and BP daily at home and follow up with your PCP and cardiologist.  2) Please watch for any sign of bleeding, if bleeding please contact your doctor 3) please see your vascular surgeon Dr. Randie Heinz regarding further tests and follow ups.   Increase activity slowly    Complete by:  As directed      Allergies as of 10/16/2016   No Known Allergies     Medication List    STOP taking these medications   rivaroxaban 20 MG Tabs tablet Commonly known as:  XARELTO Replaced by:  Rivaroxaban 15 & 20 MG Tbpk     TAKE these medications   atorvastatin 20 MG tablet Commonly known as:  LIPITOR TAKE 1 TABLET BY MOUTH EVERY DAY   colchicine 0.6 MG tablet Take 1 tablet (0.6 mg total) by mouth daily.   diltiazem 240 MG 24 hr capsule Commonly known as:  CARDIZEM CD Take 1 capsule (240 mg total) by mouth daily.   lisinopril 10 MG tablet Commonly known  as:  PRINIVIL,ZESTRIL Take 1 tablet (10 mg total) by mouth daily.   metFORMIN 500 MG tablet Commonly known as:  GLUCOPHAGE Take 500 mg by mouth 2 (two) times daily with a meal.   pantoprazole 40 MG tablet Commonly known as:  PROTONIX Take 1 tablet (40 mg total) by mouth daily.   Rivaroxaban 15 & 20 MG Tbpk Take as directed on package: Start with one 15mg  tablet by mouth twice a day with food. On Day 22, switch to one 20mg  tablet once a day with food. Replaces:  rivaroxaban 20 MG Tabs tablet      Follow-up Information    Lemar Livings, MD. Schedule an appointment as soon as possible for a visit in 2 week(s).   Specialties:  Vascular Surgery, Cardiology Contact information: [address] [place] [address] [phone]        Nicki Reaper, NP. Schedule an appointment as soon as possible for a visit in 1 week(s).   Specialty:  Internal Medicine Contact information: [address] [place] [address] [phone]          No Known Allergies  Consultations: Vascular surgery  Procedures/Studies: 2/23 Echocardiogram:Left ventricle: - mild concentric hypertrophy. -LVEF = 55% to 60%.  -(grade 2 diastolic dysfunction). 2/23 Renal Artery/Mesenteric Artery Duplex scan:-Bilateral renal arteries are patent  with mildly elevated velocities suggestive of less than 60% stenosis.  -Celiac artery and SMA demonstrated greater than 70% stenosis.  Subjective: Patient was seen and examined at bedside. Reported doing well. Denied headache, dizziness, nausea, vomiting, chest pain, shortness of breath or abdominal pain.  Discharge Exam: Vitals:   10/16/16 0920 10/16/16 1220  BP: (!) 146/79 (!) 167/68  Pulse: 65 70  Resp:    Temp:  98 F (36.7 C)   Vitals:   10/16/16 0319 10/16/16 0829 10/16/16 0920 10/16/16 1220  BP: (!) 145/87  (!) 146/79 (!) 167/68  Pulse: 84  65 70  Resp: 16     Temp: 98.3 F (36.8 C)   98 F (36.7 C)  TempSrc: Oral   Oral  SpO2: 95% 96% 96%  96%  Weight: 48.8 kg (107 lb 8 oz)     Height:        General: Pt is alert, awake, not in acute distress Cardiovascular: RRR, S1/S2 +, no rubs, no gallops Respiratory: CTA bilaterally, no wheezing, no rhonchi Abdominal: Soft, NT, ND, bowel sounds + Extremities: no edema, no cyanosis Neurologic: Alert, awake, following commands.   The results of significant diagnostics from this hospitalization (including imaging, microbiology, ancillary and laboratory) are listed below for reference.     Microbiology: Recent Results (from the past 240 hour(s))  Urine culture     Status: None   Collection Time: 10/12/16 12:34 PM  Result Value Ref Range Status   Specimen Description URINE, CLEAN CATCH  Final   Special Requests NONE  Final   Culture NO GROWTH  Final   Report Status 10/13/2016 FINAL  Final  MRSA PCR Screening     Status: None   Collection Time: 10/12/16  8:50 PM  Result Value Ref Range Status   MRSA by PCR NEGATIVE NEGATIVE Final    Comment:        The GeneXpert MRSA Assay (FDA approved for NASAL specimens only), is one component of a comprehensive MRSA colonization surveillance program. It is not intended to diagnose MRSA infection nor to guide or monitor treatment for MRSA infections.      Labs: BNP (last 3 results) No results for input(s): BNP in the last 8760 hours. Basic Metabolic Panel:  Recent Labs Lab 10/12/16 1116 10/13/16 0058 10/14/16 0239 10/15/16 0323 10/16/16 0300  NA 139 137 140 137 140  K 3.7 3.3* 4.4 3.8 3.8  CL 104 103 110 106 106  CO2 25 21* 23 21* 24  GLUCOSE 108* 295* 133* 132* 92  BUN 12 13 16 13 14   CREATININE 0.79 0.98 0.74 0.70 0.92  CALCIUM 9.0 9.0 8.8* 8.7* 9.0  MG  --  2.0 2.0 1.9 2.0   Liver Function Tests:  Recent Labs Lab 10/12/16 1116 10/13/16 0058  AST 40 47*  ALT 24 31  ALKPHOS 61 66  BILITOT 0.4 0.6  PROT 7.5 7.1  ALBUMIN 3.8 3.4*    Recent Labs Lab 10/12/16 1116  LIPASE 35   No results for input(s):  AMMONIA in the last 168 hours. CBC:  Recent Labs Lab 10/12/16 1116 10/13/16 0058 10/14/16 0239 10/14/16 1340 10/16/16 0855  WBC 12.6* 13.6* 21.7* 21.1* 20.0*  NEUTROABS  --   --   --  18.5*  --   HGB 13.8 13.2 11.5* 13.2 14.2  HCT 41.1 38.6 34.1* 38.1 42.0  MCV 85.1 84.3 83.8 83.9 85.4  PLT 279 280 222 247 291   Cardiac Enzymes: No results for input(s): CKTOTAL, CKMB,  CKMBINDEX, TROPONINI in the last 168 hours. BNP: Invalid input(s): POCBNP CBG:  Recent Labs Lab 10/15/16 1358 10/15/16 1622 10/15/16 2052 10/16/16 0815 10/16/16 1139  GLUCAP 155* 164* 134* 80 100*   D-Dimer No results for input(s): DDIMER in the last 72 hours. Hgb A1c  Recent Labs  10/14/16 1116  HGBA1C 6.0*   Lipid Profile No results for input(s): CHOL, HDL, LDLCALC, TRIG, CHOLHDL, LDLDIRECT in the last 72 hours. Thyroid function studies No results for input(s): TSH, T4TOTAL, T3FREE, THYROIDAB in the last 72 hours.  Invalid input(s): FREET3 Anemia work up No results for input(s): VITAMINB12, FOLATE, FERRITIN, TIBC, IRON, RETICCTPCT in the last 72 hours. Urinalysis    Component Value Date/Time   COLORURINE STRAW (A) 10/12/2016 1234   APPEARANCEUR CLEAR 10/12/2016 1234   LABSPEC 1.005 10/12/2016 1234   PHURINE 7.0 10/12/2016 1234   GLUCOSEU NEGATIVE 10/12/2016 1234   HGBUR NEGATIVE 10/12/2016 1234   BILIRUBINUR NEGATIVE 10/12/2016 1234   KETONESUR NEGATIVE 10/12/2016 1234   PROTEINUR NEGATIVE 10/12/2016 1234   NITRITE NEGATIVE 10/12/2016 1234   LEUKOCYTESUR NEGATIVE 10/12/2016 1234   Sepsis Labs Invalid input(s): PROCALCITONIN,  WBC,  LACTICIDVEN Microbiology Recent Results (from the past 240 hour(s))  Urine culture     Status: None   Collection Time: 10/12/16 12:34 PM  Result Value Ref Range Status   Specimen Description URINE, CLEAN CATCH  Final   Special Requests NONE  Final   Culture NO GROWTH  Final   Report Status 10/13/2016 FINAL  Final  MRSA PCR Screening     Status: None    Collection Time: 10/12/16  8:50 PM  Result Value Ref Range Status   MRSA by PCR NEGATIVE NEGATIVE Final    Comment:        The GeneXpert MRSA Assay (FDA approved for NASAL specimens only), is one component of a comprehensive MRSA colonization surveillance program. It is not intended to diagnose MRSA infection nor to guide or monitor treatment for MRSA infections.      Time coordinating discharge: Over 30 minutes  SIGNED:   Maxie Barb, MD  [facility] 10/16/2016, 1:16 PM  If 7PM-7AM, please contact night-coverage [email or web address] Password [email or web address]

## 2016-10-16 NOTE — Progress Notes (Signed)
CALLED [phone]

## 2016-10-17 ENCOUNTER — Telehealth: Payer: Self-pay | Admitting: Vascular Surgery

## 2016-10-17 NOTE — Telephone Encounter (Signed)
-----   Message from Sharee PimpleMarilyn K McChesney, RN sent at 10/16/2016 11:02 AM EST ----- Regarding: 4 weeks w/ mesenteric duplex   ----- Message ----- From: Fransisco HertzBrian L Chen, MD Sent: 10/15/2016   8:14 AM To: [address] Charge Pool  Mignon PineDoan T Greenspan [phone] [birth year]  Follow-up: Dr. Randie Heinzain in 4 weeks  Orders(s) for follow-up: Mesenteric duplex

## 2016-10-17 NOTE — Telephone Encounter (Signed)
Sched lab 11/13/16 at 10:00 and MD 11/24/16 at 9:15. Spoke to pt's daughter, she said she didn't really speak English and to call her daughter. Lm on pt's granddaughter's # to inform them of appt.

## 2016-10-18 ENCOUNTER — Telehealth: Payer: Self-pay

## 2016-10-18 ENCOUNTER — Telehealth: Payer: Self-pay | Admitting: *Deleted

## 2016-10-18 NOTE — Telephone Encounter (Signed)
Called pt via interpreter line [phone] Called patient and left message to return call via Raynelle FanningJulie [phone] ( interpreter).

## 2016-10-18 NOTE — Telephone Encounter (Signed)
[facility] Post-discharge Follow-Up Phone Call:  Date of Discharge: 10/16/16 Principal Discharge Diagnosis(es): splenic infarct, atrial fibrillation, chronic diastolic CHF Post-discharge Communication: (Clearly document all attempts clearly and date contact made) Call placed to # [phone] with the assistance of the interpreter and a voicemail message was left informing the patient that another call would be made at a later time.  Call also placed to the patient's granddaughter, Farrel Connersram # [phone] and a HIPAA compliant voicemail message was left requesting a call back to # [phone] or [phone]. The patient had informed this CM that [name] speaks English and noted her as her contact person.  Call Completed: No Interpreter Needed: Yes               Language/Dialect: [place]namese - [employer]  [place] # [phone]

## 2016-10-20 ENCOUNTER — Ambulatory Visit: Payer: Medicare HMO | Attending: Family Medicine | Admitting: Family Medicine

## 2016-10-20 ENCOUNTER — Encounter: Payer: Self-pay | Admitting: Family Medicine

## 2016-10-20 VITALS — BP 138/75 | HR 73 | Temp 98.1°F | Resp 18 | Ht 61.0 in | Wt 108.0 lb

## 2016-10-20 DIAGNOSIS — I712 Thoracic aortic aneurysm, without rupture, unspecified: Secondary | ICD-10-CM

## 2016-10-20 DIAGNOSIS — Z7984 Long term (current) use of oral hypoglycemic drugs: Secondary | ICD-10-CM | POA: Insufficient documentation

## 2016-10-20 DIAGNOSIS — Z7901 Long term (current) use of anticoagulants: Secondary | ICD-10-CM | POA: Diagnosis not present

## 2016-10-20 DIAGNOSIS — D735 Infarction of spleen: Secondary | ICD-10-CM | POA: Diagnosis not present

## 2016-10-20 DIAGNOSIS — Z8673 Personal history of transient ischemic attack (TIA), and cerebral infarction without residual deficits: Secondary | ICD-10-CM | POA: Insufficient documentation

## 2016-10-20 DIAGNOSIS — N83202 Unspecified ovarian cyst, left side: Secondary | ICD-10-CM | POA: Insufficient documentation

## 2016-10-20 DIAGNOSIS — Z9119 Patient's noncompliance with other medical treatment and regimen: Secondary | ICD-10-CM | POA: Diagnosis not present

## 2016-10-20 DIAGNOSIS — I639 Cerebral infarction, unspecified: Secondary | ICD-10-CM | POA: Diagnosis not present

## 2016-10-20 DIAGNOSIS — I110 Hypertensive heart disease with heart failure: Secondary | ICD-10-CM | POA: Insufficient documentation

## 2016-10-20 DIAGNOSIS — I5032 Chronic diastolic (congestive) heart failure: Secondary | ICD-10-CM | POA: Diagnosis not present

## 2016-10-20 DIAGNOSIS — K8020 Calculus of gallbladder without cholecystitis without obstruction: Secondary | ICD-10-CM | POA: Insufficient documentation

## 2016-10-20 DIAGNOSIS — R7302 Impaired glucose tolerance (oral): Secondary | ICD-10-CM | POA: Insufficient documentation

## 2016-10-20 DIAGNOSIS — E785 Hyperlipidemia, unspecified: Secondary | ICD-10-CM | POA: Insufficient documentation

## 2016-10-20 DIAGNOSIS — R5381 Other malaise: Secondary | ICD-10-CM | POA: Diagnosis not present

## 2016-10-20 DIAGNOSIS — E119 Type 2 diabetes mellitus without complications: Secondary | ICD-10-CM | POA: Diagnosis not present

## 2016-10-20 DIAGNOSIS — I480 Paroxysmal atrial fibrillation: Secondary | ICD-10-CM

## 2016-10-20 DIAGNOSIS — I2510 Atherosclerotic heart disease of native coronary artery without angina pectoris: Secondary | ICD-10-CM | POA: Insufficient documentation

## 2016-10-20 DIAGNOSIS — I517 Cardiomegaly: Secondary | ICD-10-CM | POA: Insufficient documentation

## 2016-10-20 DIAGNOSIS — I4891 Unspecified atrial fibrillation: Secondary | ICD-10-CM | POA: Diagnosis present

## 2016-10-20 NOTE — Progress Notes (Signed)
Patient is here for Afib FU  Patient denies pain at this time. Patient complains of feeling fatigue with a poor appetite since being discharged this past Monday.  Patient has taken medication today. Patient has eaten today.

## 2016-10-20 NOTE — Progress Notes (Signed)
Transitional care clinic  Hospitalization dates: 10/12/16 through 10/16/16  Date of telephone encounter: 10/16/16  Subjective:  Patient ID: Linda [name], female    DOB: 1941-03-21  Age: 76 y.o. MRN: [id number]  CC: Atrial Fibrillation   HPI Linda [name] is a 76 year old [place] female who presents to the transitional care clinic to get established after hospitalization at [facility] rom 10/12/24  -10/16/16.  Medical History is significant for Type 2 DM (A1c 6.0)atrial fibrillation last cardioverted 08/27/2016 in the emergency department and supposedly discharged on Cardizem and Xarelto with cardiology follow-up,history of coronary artery disease with EF of 55-60%, grade 2 diastolic dysfunction, mild aortic valvular regurgitation, mild mitral valvular regurgitation per 2-D echo 10/13/2016, hyperlipidemia, hypertension, tobacco abuse.  She had presented to the ED with abdominal pain radiating to her chest and CT findings were in keeping with splenic infarcts, wedge shaped structures in the pain, both kidneys consistent with parenchyma infarcts. She has admitted to noncompliance with Xarelto at home. She was placed on Cardizem drip for A. fib with better control of heart rate and was resumed on her home medications  CT angiography revealed 4.1 cm ascending rustic aorta aneurysm without dissection one year follow-up was recommended as per vascular surgery.  She presents today complaining of fatigue and reduced appetite since discharge. Denies fever, shortness of breath, chest pains.  Past Medical History:  Diagnosis Date  . Atrial fibrillation (HCC)    dx in setting of stroke 4/12;  echo with EF 60-65%, mild LVH, mild AI, grade 1 diast dysfxn  . Coronary artery disease   . Diabetes mellitus without complication (HCC)   . History of CVA (cerebrovascular accident)    New diagnosis December 08, 2010  . Hx of cardiovascular stress test    a. Myoview 8/12:  EF 69%, no ischemia.   .  Hyperlipidemia   . Hypertension   . Impaired glucose tolerance 05/08/2011  . Stroke [facility]) 2012   no deficits  . Tobacco abuse    Ongoing (3-6 cigarettes per day, 25 pack year history)    Past Surgical History:  Procedure Laterality Date  . COLONOSCOPY N/A 02/01/2016   Procedure: COLONOSCOPY;  Surgeon: Ruffin Frederick, MD;  Location: [facility] ENDOSCOPY;  Service: Gastroenterology;  Laterality: N/A;    No Known Allergies   Outpatient Medications Prior to Visit  Medication Sig Dispense Refill  . atorvastatin (LIPITOR) 20 MG tablet TAKE 1 TABLET BY MOUTH EVERY DAY 30 tablet 5  . colchicine 0.6 MG tablet Take 1 tablet (0.6 mg total) by mouth daily. 30 tablet 0  . diltiazem (CARDIZEM CD) 240 MG 24 hr capsule Take 1 capsule (240 mg total) by mouth daily. 30 capsule 0  . lisinopril (PRINIVIL,ZESTRIL) 10 MG tablet Take 1 tablet (10 mg total) by mouth daily. 30 tablet 2  . metFORMIN (GLUCOPHAGE) 500 MG tablet Take 500 mg by mouth 2 (two) times daily with a meal.    . pantoprazole (PROTONIX) 40 MG tablet Take 1 tablet (40 mg total) by mouth daily. 30 tablet 2  . Rivaroxaban 15 & 20 MG TBPK Take as directed on package: Start with one 15mg  tablet by mouth twice a day with food. On Day 22, switch to one 20mg  tablet once a day with food. 51 each 0   No facility-administered medications prior to visit.     ROS Review of Systems  Constitutional: Positive for activity change and fatigue. Negative for appetite change.  HENT: Negative for congestion, sinus  pressure and sore throat.   Eyes: Negative for visual disturbance.  Respiratory: Negative for cough, chest tightness, shortness of breath and wheezing.   Cardiovascular: Negative for chest pain and palpitations.  Gastrointestinal: Negative for abdominal distention, abdominal pain and constipation.  Endocrine: Negative for polydipsia.  Genitourinary: Negative for dysuria and frequency.  Musculoskeletal: Negative for arthralgias and back pain.    Skin: Negative for rash.  Neurological: Negative for tremors, light-headedness and numbness.  Hematological: Does not bruise/bleed easily.  Psychiatric/Behavioral: Negative for agitation and behavioral problems.    Objective:  BP 138/75 (BP Location: Right Arm, Patient Position: Sitting, Cuff Size: Small)   Pulse 73   Temp 98.1 F (36.7 C) (Oral)   Resp 18   Ht 5\' 1"  (1.549 m)   Wt 108 lb (49 kg)   SpO2 100%   BMI 20.41 kg/m   BP/Weight 10/20/2016 10/16/2016 08/27/2016  Systolic BP 138 167 151  Diastolic BP 75 68 65  Wt. (Lbs) 108 107.5 110  BMI 20.41 20.99 22.22      Physical Exam  Constitutional: She is oriented to person, place, and time. She appears well-developed and well-nourished.  Cardiovascular: Normal rate, normal heart sounds and intact distal pulses.   No murmur heard. Pulmonary/Chest: Effort normal and breath sounds normal. She has no wheezes. She has no rales. She exhibits no tenderness.  Abdominal: Soft. Bowel sounds are normal. She exhibits no distension and no mass. There is no tenderness.  Musculoskeletal: Normal range of motion.  Neurological: She is alert and oriented to person, place, and time.  Skin: Skin is warm and dry.  Psychiatric: She has a normal mood and affect.     Lab Results  Component Value Date   HGBA1C 6.0 (H) 10/14/2016    CLINICAL DATA:  Abdominal pain, constipation  EXAM: CT ABDOMEN AND PELVIS WITH CONTRAST  TECHNIQUE: Multidetector CT imaging of the abdomen and pelvis was performed using the standard protocol following bolus administration of intravenous contrast.  CONTRAST:  80 cc Isovue 300  COMPARISON:  CT abdomen pelvis of 04/18/2016  FINDINGS: Lower chest: The lung bases clear. Cardiomegaly is stable. No pericardial effusion is seen.  Hepatobiliary: The focus of abnormally increased enhancement within the medial segment of the left lobe of liver described on the prior CT of the abdomen has not changed. No  other focal hepatic abnormality is seen. If further assessment is warranted, MRI of the liver could be performed. Gallstones layer within the gallbladder as noted previously.  Pancreas: The pancreas is normal in size and the pancreatic duct is not dilated.  Spleen: There are now multiple wedge-shaped low-attenuation structures extending to the periphery the spleen most consistent with splenic infarcts.  Adrenals/Urinary Tract: The adrenal glands are unremarkable with a probable tiny adrenal adenoma on the right unchanged. The kidneys enhance and there are foci of abnormal profusion in both kidneys extending to the periphery worrisome for either pyelonephritis or focal renal infarction as well. No hydronephrosis is seen. The ureters are normal in caliber. The urinary bladder is unremarkable.  Stomach/Bowel: The stomach is moderately distended with oral contrast. No abnormality is seen. No small bowel distention is seen. There is feces within the colon. No colonic mucosal edema is seen. The appendix and terminal ileum are unremarkable.  Vascular/Lymphatic: There is significant abdominal aortic atherosclerosis present with plaque formation.  Reproductive: The uterus is unremarkable. A left ovarian cyst is now present of approximately 3.1 cm on image 62 series 2. No free fluid is  seen within the pelvis.  Other: None.  Musculoskeletal: The lumbar vertebrae are in normal alignment. Degenerative disc disease is present primarily at L3-4, L4-5, and L5-S1 levels.  IMPRESSION: 1. Peripheral wedge-shaped low-attenuation structures within the spleen most consistent with splenic infarcts. 2. There are several peripheral low-attenuation wedge shaped structures in the parenchyma of both kidneys as well. These could represent parenchymal infarcts or possibly foci of pyelonephritis. Clinical correlation is recommended. 3. Significant abdominal aortic atherosclerotic change. 4.  Left ovarian cyst of 3.1 cm. 5. The focus of abnormal enhancement within the medial segment of the left lobe of liver is unchanged and of questionable significance. Consider MR if further assessment is warranted. 6. Gallstones. 7. Critical Value/emergent results were called by telephone at the time of interpretation on 10/12/2016 at 4:23 pm to [facility] Center[facility]UDIA GIBBONS , who verbally acknowledged these results.   CLINICAL DATA:  Chest pain. Thromboembolism with spleen and renal infarcts.  EXAM: CT ANGIOGRAPHY CHEST WITH CONTRAST  TECHNIQUE: Multidetector CT imaging of the chest was performed using the standard protocol during bolus administration of intravenous contrast. Multiplanar CT image reconstructions and MIPs were obtained to evaluate the vascular anatomy.  CONTRAST:  80 mL Isovue 370  COMPARISON:  None.  FINDINGS: Cardiovascular: No evidence thoracic aortic dissection. 4.1 cm ascending thoracic aortic aneurysm. Mild diffuse calcified and noncalcified atherosclerotic plaque seen throughout the thoracic aortic, without evidence of penetrating ulcer stool and mediastinal hematoma. Mild cardiomegaly, without pericardial effusion. Pulmonary arteries are well opacified, and no acute pulmonary embolism visualized.  Mediastinum/Nodes: No masses or pathologically enlarged lymph nodes identified.  Lungs/Pleura: No pulmonary mass, infiltrate, or effusion.  Upper abdomen: Cholelithiasis again noted.  Musculoskeletal: No suspicious bone lesions or other significant abnormality identified.  Review of the MIP images confirms the above findings.  IMPRESSION: 4.1 cm ascending thoracic aortic aneurysm. No evidence of thoracic aortic dissection. Recommend annual imaging followup by CTA or MRA. This recommendation follows 2010 ACCF/AHA/AATS/ACR/ASA/SCA/SCAI/SIR/STS/SVM Guidelines for the Diagnosis and Management of Patients with Thoracic Aortic Disease. Circulation. 2010;  121: [id number]  Mild diffuse calcified and non calcified atherosclerotic plaque throughout thoracic aorta.  Mild cardiomegaly.  No acute findings.   Electronically Signed   By: Myles RosenthalJohn  Stahl M.D.   On: 10/15/2016 07:26  Electronically Signed   By: Dwyane DeePaul  Barry M.D.   On: 10/12/2016 16:23 Assessment & Plan:   1. Splenic infarct Stable  2. PAF (paroxysmal atrial fibrillation) (HCC) Currently on anticoagulation with Xarelto Rate control with diltiazem  3. Cerebrovascular accident (CVA), unspecified mechanism (HCC) No residual deficit Risk factor modification  4. Chronic diastolic CHF (congestive heart failure) (HCC) EF of 55-60% from 2-D echo 09/2016 Euvolemic   5. Physical deconditioning Activity as tolerated Advised to drink Glucerna and normal global liquids until she is able to tolerate solids  6. Diabetes mellitus Continue metformin  7. Thoracic aortic aneurysm 4.1 cm Follow-up CT in 1 year-due 09/2017 No orders of the defined types were placed in this encounter.   Follow-up: Return in about 2 weeks (around 11/03/2016) for TCC - follow up of Afib.   Jaclyn ShaggyEnobong Amao MD

## 2016-10-23 ENCOUNTER — Ambulatory Visit: Payer: Medicare HMO | Admitting: Internal Medicine

## 2016-10-23 DIAGNOSIS — I712 Thoracic aortic aneurysm, without rupture, unspecified: Secondary | ICD-10-CM | POA: Insufficient documentation

## 2016-10-23 DIAGNOSIS — E119 Type 2 diabetes mellitus without complications: Secondary | ICD-10-CM | POA: Insufficient documentation

## 2016-10-26 ENCOUNTER — Ambulatory Visit (INDEPENDENT_AMBULATORY_CARE_PROVIDER_SITE_OTHER): Payer: Medicare HMO | Admitting: Internal Medicine

## 2016-10-26 ENCOUNTER — Encounter: Payer: Self-pay | Admitting: Internal Medicine

## 2016-10-26 VITALS — BP 122/62 | HR 78 | Ht 61.0 in | Wt 109.5 lb

## 2016-10-26 DIAGNOSIS — I480 Paroxysmal atrial fibrillation: Secondary | ICD-10-CM | POA: Diagnosis not present

## 2016-10-26 MED ORDER — PANTOPRAZOLE SODIUM 40 MG PO TBEC: 40.0000 mg | DELAYED_RELEASE_TABLET | Freq: Every day | ORAL | 6 refills | Status: DC

## 2016-10-26 MED ORDER — DILTIAZEM HCL ER BEADS 360 MG PO CP24: 360.0000 mg | ORAL_CAPSULE | Freq: Every day | ORAL | 6 refills | Status: DC

## 2016-10-26 MED ORDER — LISINOPRIL 5 MG PO TABS: 5.0000 mg | ORAL_TABLET | Freq: Every day | ORAL | 6 refills | Status: DC

## 2016-10-26 MED ORDER — RIVAROXABAN 20 MG PO TABS: 20.0000 mg | ORAL_TABLET | Freq: Every day | ORAL | Status: DC

## 2016-10-26 MED ORDER — RIVAROXABAN 20 MG PO TABS: 20.0000 mg | ORAL_TABLET | Freq: Every day | ORAL | 0 refills | Status: DC

## 2016-10-26 NOTE — Patient Instructions (Addendum)
Medication Instructions: - Your physician has recommended you make the following change in your medication:  1) Increase cardizem (diltiazem) to 360 mg- take one capsule by mouth once daily 2) Decrease lisinopril to 5 mg - take one tablet by mouth once daily 3) take xarelto 20 mg- take one tablet by mouth once daily  Labwork: - none ordered  Procedures/Testing: - none ordered  Follow-Up: - Your physician recommends that you schedule a follow-up appointment in: 2 months with Rudi Cocoonna Carroll, NP for Dr. Graciela HusbandsKlein (a-fib clinic).   Any Additional Special Instructions Will Be Listed Below (If Applicable).     If you need a refill on your cardiac medications before your next appointment, please call your pharmacy.

## 2016-10-26 NOTE — Progress Notes (Signed)
Patient Care Team: Jaclyn Shaggy, MD as PCP - General (Family Medicine)   HPI  Linda [name] is a 76 y.o. female  Seen in followup for atrial fibrillation and prior stroke. She was previously treated with propafenone but no longer. She is on anticoagulation with Rivaroxaban  Most recently cardioverted 1/18 in the emergency room.  She had presented a month or so later with splenic infarcts admitting subsequently to noncompliance. These ER records were reviewed She complains of dyspnea. This is present even now at rest. It is not aggravated by exertion.    She has poor understanding as to the reason for her anticoagulation. She has palpitations. Sometimes they're associated with dizziness but   did not have a big impact on functional status     Records and Results Reviewed   Past Medical History:  Diagnosis Date  . Atrial fibrillation (HCC)    dx in setting of stroke 4/12;  echo with EF 60-65%, mild LVH, mild AI, grade 1 diast dysfxn  . Coronary artery disease   . Diabetes mellitus without complication (HCC)   . History of CVA (cerebrovascular accident)    New diagnosis December 08, 2010  . Hx of cardiovascular stress test    a. Myoview 8/12:  EF 69%, no ischemia.   . Hyperlipidemia   . Hypertension   . Impaired glucose tolerance 05/08/2011  . Stroke [facility]) 2012   no deficits  . Tobacco abuse    Ongoing (3-6 cigarettes per day, 25 pack year history)    Past Surgical History:  Procedure Laterality Date  . COLONOSCOPY N/A 02/01/2016   Procedure: COLONOSCOPY;  Surgeon: Ruffin Frederick, MD;  Location: [facility] ENDOSCOPY;  Service: Gastroenterology;  Laterality: N/A;    Current Outpatient Prescriptions  Medication Sig Dispense Refill  . atorvastatin (LIPITOR) 20 MG tablet TAKE 1 TABLET BY MOUTH EVERY DAY 30 tablet 5  . colchicine 0.6 MG tablet Take 1 tablet (0.6 mg total) by mouth daily. 30 tablet 0  . diltiazem (CARDIZEM CD) 240 MG 24 hr capsule Take 1 capsule (240 mg total)  by mouth daily. 30 capsule 0  . lisinopril (PRINIVIL,ZESTRIL) 10 MG tablet Take 1 tablet (10 mg total) by mouth daily. 30 tablet 2  . metFORMIN (GLUCOPHAGE) 500 MG tablet Take 500 mg by mouth 2 (two) times daily with a meal.    . pantoprazole (PROTONIX) 40 MG tablet Take 1 tablet (40 mg total) by mouth daily. 30 tablet 2  . Rivaroxaban 15 & 20 MG TBPK Take as directed on package: Start with one 15mg  tablet by mouth twice a day with food. On Day 22, switch to one 20mg  tablet once a day with food. 51 each 0   No current facility-administered medications for this visit.     No Known Allergies    Review of Systems negative except from HPI and PMH  Physical Exam BP 122/62   Pulse 78   Ht 5\' 1"  (1.549 m)   Wt 109 lb 8 oz (49.7 kg)   SpO2 98%   BMI 20.69 kg/m  Well developed and well nourished in no acute distress HENT normal E scleral and icterus clear Neck Supple JVP flat; carotids brisk and full Clear to ausculation Rapid and irregular rate and rhythm, no murmurs gallops or rub Soft with active bowel sounds No clubbing cyanosis  Edema Alert and oriented, grossly normal motor and sensory function Skin Warm and Dry  ECG atrial fibrillation 101 -/09/39  Assessment  and  Plan    Dyspnea  Atrial fibrillation-persistent  Hypertension  Tobacco abuse  Prior stroke  Clots- splenic/renal infarct  We used the [employer] interpre[employer] to discuss the issues of anticoagulation and the importance of its routine and constant use given her history of prior thromboembolism. Her GFR 2/18 was greater than 50. We will continue her on her current dose of Rivaroxaban   Her heart rate is somewhat rapid. We'll decrease her lisinopril 10--5 as she needs Ace inhibition given her   diabetes and hypertension. We will increase her Cardizem from 240--360 mg a day.  I will have her come back to the A. fib clinic in about 2 months for reassessment of heart rata control Current medicines are  reviewed at length with the patient today .  The patient does not have concerns regarding medicines.  More than 50% of 45 min was spent in counseling related to the above  Discussion through [employer]

## 2016-11-06 ENCOUNTER — Ambulatory Visit: Payer: Medicare HMO | Attending: Family Medicine | Admitting: Family Medicine

## 2016-11-06 ENCOUNTER — Encounter: Payer: Self-pay | Admitting: Family Medicine

## 2016-11-06 VITALS — BP 175/92 | HR 63 | Temp 98.1°F | Ht 60.5 in | Wt 108.6 lb

## 2016-11-06 DIAGNOSIS — Z7984 Long term (current) use of oral hypoglycemic drugs: Secondary | ICD-10-CM | POA: Diagnosis not present

## 2016-11-06 DIAGNOSIS — I340 Nonrheumatic mitral (valve) insufficiency: Secondary | ICD-10-CM | POA: Diagnosis not present

## 2016-11-06 DIAGNOSIS — K5900 Constipation, unspecified: Secondary | ICD-10-CM | POA: Insufficient documentation

## 2016-11-06 DIAGNOSIS — I4891 Unspecified atrial fibrillation: Secondary | ICD-10-CM | POA: Diagnosis not present

## 2016-11-06 DIAGNOSIS — K5909 Other constipation: Secondary | ICD-10-CM | POA: Diagnosis not present

## 2016-11-06 DIAGNOSIS — E785 Hyperlipidemia, unspecified: Secondary | ICD-10-CM | POA: Diagnosis not present

## 2016-11-06 DIAGNOSIS — R109 Unspecified abdominal pain: Secondary | ICD-10-CM | POA: Diagnosis not present

## 2016-11-06 DIAGNOSIS — D735 Infarction of spleen: Secondary | ICD-10-CM | POA: Insufficient documentation

## 2016-11-06 DIAGNOSIS — I351 Nonrheumatic aortic (valve) insufficiency: Secondary | ICD-10-CM | POA: Insufficient documentation

## 2016-11-06 DIAGNOSIS — E784 Other hyperlipidemia: Secondary | ICD-10-CM | POA: Diagnosis not present

## 2016-11-06 DIAGNOSIS — I10 Essential (primary) hypertension: Secondary | ICD-10-CM

## 2016-11-06 DIAGNOSIS — Z7901 Long term (current) use of anticoagulants: Secondary | ICD-10-CM | POA: Diagnosis not present

## 2016-11-06 DIAGNOSIS — I712 Thoracic aortic aneurysm, without rupture: Secondary | ICD-10-CM | POA: Diagnosis not present

## 2016-11-06 DIAGNOSIS — I480 Paroxysmal atrial fibrillation: Secondary | ICD-10-CM | POA: Insufficient documentation

## 2016-11-06 DIAGNOSIS — E118 Type 2 diabetes mellitus with unspecified complications: Secondary | ICD-10-CM | POA: Diagnosis not present

## 2016-11-06 DIAGNOSIS — I110 Hypertensive heart disease with heart failure: Secondary | ICD-10-CM | POA: Diagnosis not present

## 2016-11-06 DIAGNOSIS — I2510 Atherosclerotic heart disease of native coronary artery without angina pectoris: Secondary | ICD-10-CM | POA: Diagnosis not present

## 2016-11-06 DIAGNOSIS — E7849 Other hyperlipidemia: Secondary | ICD-10-CM

## 2016-11-06 DIAGNOSIS — E119 Type 2 diabetes mellitus without complications: Secondary | ICD-10-CM | POA: Diagnosis not present

## 2016-11-06 DIAGNOSIS — I5032 Chronic diastolic (congestive) heart failure: Secondary | ICD-10-CM | POA: Insufficient documentation

## 2016-11-06 DIAGNOSIS — Z8673 Personal history of transient ischemic attack (TIA), and cerebral infarction without residual deficits: Secondary | ICD-10-CM | POA: Diagnosis not present

## 2016-11-06 MED ORDER — LACTULOSE 10 GM/15ML PO SOLN: 10.0000 g | Freq: Two times a day (BID) | ORAL | 1 refills | Status: DC | PRN

## 2016-11-06 MED ORDER — RIVAROXABAN 20 MG PO TABS: 20.0000 mg | ORAL_TABLET | Freq: Every day | ORAL | 3 refills | Status: DC

## 2016-11-06 NOTE — Progress Notes (Signed)
Transitional care clinic  Hospitalization dates: 10/12/16 through 10/16/16  Date of telephone encounter: 10/16/16  Subjective:    Patient ID: Linda [name], female    DOB: 1941-08-10, 76 y.o.   MRN: [id number]  HPI Linda [name] is a 76 year old [place] female With recent hospitalization at [facility] rom 10/12/24  -10/16/16.  Medical History is significant for Type 2 DM (A1c 6.0)atrial fibrillation last cardioverted 08/27/2016 in the emergency department and supposedly discharged on Cardizem and Xarelto with cardiology follow-up,history of coronary artery disease with EF of 55-60%, grade 2 diastolic dysfunction, mild aortic valvular regurgitation, mild mitral valvular regurgitation per 2-D echo 10/13/2016, hyperlipidemia, hypertension, tobacco abuse.  She had presented to the ED with abdominal pain radiating to her chest and CT findings were in keeping with splenic infarcts, wedge shaped structures in the pain, both kidneys consistent with parenchyma infarcts. She has admitted to noncompliance with Xarelto at home. She was placed on Cardizem drip for A. fib with better control of heart rate and was resumed on her home medications CT angiography revealed 4.1 cm ascending rustic aorta aneurysm without dissection one year follow-up was recommended as per vascular surgery.  Interval history She presents today stating the fatigue and reduced appetite she complained of at her last visit have improved. She however complains of lower abdominal pain and endorses constipation . Her blood pressure is elevated and she is yet to take her Diltiazem.  Her last visit to the A. fib clinic was on 10/26/16 with her dose of lisinopril was decreased and Cardizem dose increased due to rapid heart rate. Denies fever, shortness of breath, chest pains.  Past Medical History:  Diagnosis Date  . Atrial fibrillation (HCC)    dx in setting of stroke 4/12;  echo with EF 60-65%, mild LVH, mild AI, grade 1 diast  dysfxn  . Coronary artery disease   . Diabetes mellitus without complication (HCC)   . History of CVA (cerebrovascular accident)    New diagnosis December 08, 2010  . Hx of cardiovascular stress test    a. Myoview 8/12:  EF 69%, no ischemia.   . Hyperlipidemia   . Hypertension   . Impaired glucose tolerance 05/08/2011  . Stroke [facility]) 2012   no deficits  . Tobacco abuse    Ongoing (3-6 cigarettes per day, 25 pack year history)    Past Surgical History:  Procedure Laterality Date  . COLONOSCOPY N/A 02/01/2016   Procedure: COLONOSCOPY;  Surgeon: Ruffin Frederick, MD;  Location: [facility] ENDOSCOPY;  Service: Gastroenterology;  Laterality: N/A;    No Known Allergies  Current Outpatient Prescriptions on File Prior to Visit  Medication Sig Dispense Refill  . atorvastatin (LIPITOR) 20 MG tablet TAKE 1 TABLET BY MOUTH EVERY DAY 30 tablet 5  . colchicine 0.6 MG tablet Take 1 tablet (0.6 mg total) by mouth daily. 30 tablet 0  . diltiazem (TIAZAC) 360 MG 24 hr capsule Take 1 capsule (360 mg total) by mouth daily. 30 capsule 6  . lisinopril (PRINIVIL,ZESTRIL) 5 MG tablet Take 1 tablet (5 mg total) by mouth daily. 30 tablet 6  . metFORMIN (GLUCOPHAGE) 500 MG tablet Take 500 mg by mouth 2 (two) times daily with a meal.    . pantoprazole (PROTONIX) 40 MG tablet Take 1 tablet (40 mg total) by mouth daily. (Patient not taking: Reported on 11/06/2016) 30 tablet 6   No current facility-administered medications on file prior to visit.       Review of Systems  Constitutional: Negative for activity change, appetite change and fatigue.  HENT: Negative for congestion, sinus pressure and sore throat.   Eyes: Negative for visual disturbance.  Respiratory: Negative for cough, chest tightness, shortness of breath and wheezing.   Cardiovascular: Negative for chest pain and palpitations.  Gastrointestinal: Positive for constipation. Negative for abdominal distention and abdominal pain.  Endocrine: Negative  for polydipsia.  Genitourinary: Negative for dysuria and frequency.  Musculoskeletal: Negative for arthralgias and back pain.  Skin: Negative for rash.  Neurological: Negative for tremors, light-headedness and numbness.  Hematological: Does not bruise/bleed easily.  Psychiatric/Behavioral: Negative for agitation and behavioral problems.       Objective: Vitals:   11/06/16 1015  BP: (!) 175/92  Pulse: 63  Temp: 98.1 F (36.7 C)  TempSrc: Oral  SpO2: 100%  Weight: 108 lb 9.6 oz (49.3 kg)  Height: 5' 0.5" (1.537 m)      Physical Exam  Constitutional: She is oriented to person, place, and time. She appears well-developed and well-nourished.  Neck: No JVD present.  Cardiovascular: Normal rate, normal heart sounds and intact distal pulses.   No murmur heard. Pulmonary/Chest: Effort normal and breath sounds normal. She has no wheezes. She has no rales. She exhibits no tenderness.  Abdominal: Soft. Bowel sounds are normal. She exhibits no distension and no mass. There is no tenderness.  Musculoskeletal: Normal range of motion.  Neurological: She is alert and oriented to person, place, and time.  Skin: Skin is warm and dry.  Psychiatric: She has a normal mood and affect.          Lab Results  Component Value Date   HGBA1C 6.0 (H) 10/14/2016    CMP Latest Ref Rng & Units 10/16/2016 10/15/2016 10/14/2016  Glucose 65 - 99 mg/dL 92 536(U132(H) 440(H133(H)  BUN 6 - 20 mg/dL 14 13 16   Creatinine 0.44 - 1.00 mg/dL 4.740.92 2.590.70 5.630.74  Sodium 135 - 145 mmol/L 140 137 140  Potassium 3.5 - 5.1 mmol/L 3.8 3.8 4.4  Chloride 101 - 111 mmol/L 106 106 110  CO2 22 - 32 mmol/L 24 21(L) 23  Calcium 8.9 - 10.3 mg/dL 9.0 8.7(F8.7(L) 6.4(P8.8(L)  Total Protein 6.5 - 8.1 g/dL - - -  Total Bilirubin 0.3 - 1.2 mg/dL - - -  Alkaline Phos 38 - 126 U/L - - -  AST 15 - 41 U/L - - -  ALT 14 - 54 U/L - - -      Assessment & Plan:  1.Hypertension Uncontrolled due to not taking diltiazem I have advised her to take this  in the morning along with her other meds Low-sodium, DASH diet  2. PAF (paroxysmal atrial fibrillation) (HCC) Currently on anticoagulation with Xarelto Rate control with diltiazem  3. Cerebrovascular accident (CVA), unspecified mechanism (HCC) No residual deficit Risk factor modification  4. Chronic diastolic CHF (congestive heart failure) (HCC) EF of 55-60% from 2-D echo 09/2016 Euvolemic   5. Constipation/abdominal pain Placed on lactulose Increase fiber intake and water  6. Diabetes mellitus Controlled with A1c of 6.0 Continue metformin Diabetic health care maintenance at next visit  7. Thoracic aortic aneurysm 4.1 cm Follow-up CT in 1 year-due 09/2017 No orders of the defined types were placed in this encounter.

## 2016-11-13 ENCOUNTER — Ambulatory Visit (HOSPITAL_COMMUNITY)
Admission: RE | Admit: 2016-11-13 | Discharge: 2016-11-13 | Disposition: A | Discharge status: Home / Self Care | Payer: Medicare HMO | Source: Ambulatory Visit | Attending: Vascular Surgery | Admitting: Vascular Surgery

## 2016-11-13 DIAGNOSIS — R109 Unspecified abdominal pain: Secondary | ICD-10-CM | POA: Diagnosis not present

## 2016-11-13 DIAGNOSIS — Z9862 Peripheral vascular angioplasty status: Secondary | ICD-10-CM | POA: Insufficient documentation

## 2016-11-13 DIAGNOSIS — K551 Chronic vascular disorders of intestine: Secondary | ICD-10-CM | POA: Insufficient documentation

## 2016-11-14 ENCOUNTER — Encounter: Payer: Self-pay | Admitting: Vascular Surgery

## 2016-11-24 ENCOUNTER — Ambulatory Visit (INDEPENDENT_AMBULATORY_CARE_PROVIDER_SITE_OTHER): Payer: Medicare HMO | Admitting: Vascular Surgery

## 2016-11-24 ENCOUNTER — Encounter: Payer: Self-pay | Admitting: Vascular Surgery

## 2016-11-24 VITALS — BP 138/80 | HR 82 | Temp 97.6°F | Resp 16 | Ht 60.5 in | Wt 109.0 lb

## 2016-11-24 DIAGNOSIS — K551 Chronic vascular disorders of intestine: Secondary | ICD-10-CM

## 2016-11-24 NOTE — Progress Notes (Signed)
Patient ID: Linda [name], female   DOB: [birth year], 76 y.o.   MRN: [id number]  Reason for Consult: Re-evaluation (4 wk f/u mesenteric duplex)   Referred by Lorre Munroe, NP  Subjective:     HPI:  Linda [name] is a 76 y.o. female follows up from recent hospitalization where she was found to have splenic and renal infarcts with known atrial fibrillation not on anticoagulation. At that time we obtained CT angiogram of her chest which did not demonstrate any source of embolism but she did have known calcification of much of her aorta also at the ostia of the celiac and SMA. She follows up today with mesenteric duplex. History obtained through her nephew demonstrates that she is eating well without much abdominal pain. She does have some fluttering in her chest which is a known problem. She is otherwise without issue at her normal level of activity taking Xarelto as scheduled.  Past Medical History:  Diagnosis Date  . Atrial fibrillation (HCC)    dx in setting of stroke 4/12;  echo with EF 60-65%, mild LVH, mild AI, grade 1 diast dysfxn  . Coronary artery disease   . Diabetes mellitus without complication (HCC)   . History of CVA (cerebrovascular accident)    New diagnosis December 08, 2010  . Hx of cardiovascular stress test    a. Myoview 8/12:  EF 69%, no ischemia.   . Hyperlipidemia   . Hypertension   . Impaired glucose tolerance 05/08/2011  . Stroke [facility]) 2012   no deficits  . Tobacco abuse    Ongoing (3-6 cigarettes per day, 25 pack year history)   Family History  Problem Relation Age of Onset  . Coronary artery disease Neg Hx    Past Surgical History:  Procedure Laterality Date  . COLONOSCOPY N/A 02/01/2016   Procedure: COLONOSCOPY;  Surgeon: Ruffin Frederick, MD;  Location: [facility] ENDOSCOPY;  Service: Gastroenterology;  Laterality: N/A;    Short Social History:  Social History  Substance Use Topics  . Smoking status: Light Tobacco Smoker    Packs/day: 0.25    Years:  25.00    Types: Cigarettes  . Smokeless tobacco: Never Used     Comment: Down to 1 cigarette per day.   . Alcohol use No    No Known Allergies  Current Outpatient Prescriptions  Medication Sig Dispense Refill  . atorvastatin (LIPITOR) 20 MG tablet TAKE 1 TABLET BY MOUTH EVERY DAY 30 tablet 5  . colchicine 0.6 MG tablet Take 1 tablet (0.6 mg total) by mouth daily. 30 tablet 0  . diltiazem (TIAZAC) 360 MG 24 hr capsule Take 1 capsule (360 mg total) by mouth daily. 30 capsule 6  . lactulose (CHRONULAC) 10 GM/15ML solution Take 15 mLs (10 g total) by mouth 2 (two) times daily as needed for mild constipation. 946 mL 1  . lisinopril (PRINIVIL,ZESTRIL) 5 MG tablet Take 1 tablet (5 mg total) by mouth daily. 30 tablet 6  . metFORMIN (GLUCOPHAGE) 500 MG tablet Take 500 mg by mouth 2 (two) times daily with a meal.    . pantoprazole (PROTONIX) 40 MG tablet Take 1 tablet (40 mg total) by mouth daily. 30 tablet 6  . rivaroxaban (XARELTO) 20 MG TABS tablet Take 1 tablet (20 mg total) by mouth daily with supper. 30 tablet 3   No current facility-administered medications for this visit.     Review of Systems  Constitutional:  Constitutional negative. Eyes: Eyes negative.  Respiratory: Respiratory negative.  Cardiovascular: Positive for chest pain and irregular heartbeat.  GI: Gastrointestinal negative.  Musculoskeletal: Musculoskeletal negative.  Skin: Skin negative.  Neurological: Neurological negative. Hematologic: Hematologic/lymphatic negative.  Psychiatric: Psychiatric negative.        Objective:  Objective   Vitals:   11/24/16 0906  BP: 138/80  Pulse: 82  Resp: 16  Temp: 97.6 F (36.4 C)  TempSrc: Oral  SpO2: 99%  Weight: 109 lb (49.4 kg)  Height: 5' 0.5" (1.537 m)   Body mass index is 20.94 kg/m.  Physical Exam  Constitutional: She is oriented to person, place, and time. She appears well-developed.  HENT:  Head: Normocephalic.  Eyes: Pupils are equal, round, and  reactive to light.  Neck: Normal range of motion.  Cardiovascular: Normal rate.   Pulses:      Radial pulses are 2+ on the right side, and 2+ on the left side.       Femoral pulses are 2+ on the right side, and 2+ on the left side. Pulmonary/Chest: Effort normal.  Abdominal: Soft. She exhibits no mass. There is no rebound and no guarding.  Musculoskeletal: Normal range of motion. She exhibits no edema.  Neurological: She is alert and oriented to person, place, and time.  Skin: Skin is warm and dry.  Psychiatric: She has a normal mood and affect. Her behavior is normal. Judgment and thought content normal.    Data:      I independently reviewed her mesenteric duplex demonstrating above findings with greater than 70% celiac stenosis, less than 70% SMA stenosis and flow in her IMA. I also reviewed her CT angiogram of her chest and abdomen from the hospital which did not demonstrate any source of embolic disease but she does have significant calcification of her aorta. She also has an enlarged aortic root to 4 cm.  Assessment/Plan:    76 year old female presents today for follow-up of recent embolic disease to her kidneys and spleen. She is now maintained on Xarelto and has calcification of her aorta duplex today demonstrating stenosis of her so iliac artery with less than 70% in her SMA and a patent IMA. She is not having abdominal symptoms and is eating without issues. She can follow-up in 1 year with repeat mesenteric duplex at that time. She has a 4 cm ascending aorta for which she should be seen by thoracic surgery and interval follow-up with CT scan.      Maeola Harman MD [facility]

## 2016-11-27 NOTE — Addendum Note (Signed)
Addended by: Burton Apley A on: 11/27/2016 09:20 AM   Modules accepted: Orders

## 2017-01-01 ENCOUNTER — Encounter (HOSPITAL_COMMUNITY): Payer: Self-pay | Admitting: *Deleted

## 2017-01-01 ENCOUNTER — Inpatient Hospital Stay (HOSPITAL_COMMUNITY): Admission: RE | Admit: 2017-01-01 | Payer: Medicare HMO | Source: Ambulatory Visit | Admitting: Nurse Practitioner

## 2017-01-29 DIAGNOSIS — I10 Essential (primary) hypertension: Secondary | ICD-10-CM | POA: Diagnosis not present

## 2017-01-29 DIAGNOSIS — E785 Hyperlipidemia, unspecified: Secondary | ICD-10-CM | POA: Diagnosis not present

## 2017-01-29 DIAGNOSIS — E559 Vitamin D deficiency, unspecified: Secondary | ICD-10-CM | POA: Diagnosis not present

## 2017-01-29 DIAGNOSIS — R739 Hyperglycemia, unspecified: Secondary | ICD-10-CM | POA: Diagnosis not present

## 2017-01-29 DIAGNOSIS — Z0000 Encounter for general adult medical examination without abnormal findings: Secondary | ICD-10-CM | POA: Diagnosis not present

## 2017-02-05 ENCOUNTER — Ambulatory Visit: Payer: Medicare HMO | Admitting: Family Medicine

## 2017-02-05 DIAGNOSIS — G8929 Other chronic pain: Secondary | ICD-10-CM | POA: Diagnosis not present

## 2017-02-05 DIAGNOSIS — E785 Hyperlipidemia, unspecified: Secondary | ICD-10-CM | POA: Diagnosis not present

## 2017-02-05 DIAGNOSIS — M545 Low back pain: Secondary | ICD-10-CM | POA: Diagnosis not present

## 2017-02-05 DIAGNOSIS — I10 Essential (primary) hypertension: Secondary | ICD-10-CM | POA: Diagnosis not present

## 2017-02-05 DIAGNOSIS — Z1211 Encounter for screening for malignant neoplasm of colon: Secondary | ICD-10-CM | POA: Diagnosis not present

## 2017-02-27 ENCOUNTER — Other Ambulatory Visit: Payer: Self-pay | Admitting: Family Medicine

## 2017-03-07 ENCOUNTER — Encounter: Payer: Self-pay | Admitting: Family Medicine

## 2017-03-07 ENCOUNTER — Ambulatory Visit: Payer: Medicare HMO | Attending: Family Medicine | Admitting: Family Medicine

## 2017-03-07 VITALS — BP 123/69 | HR 49 | Temp 98.8°F | Wt 111.0 lb

## 2017-03-07 DIAGNOSIS — I712 Thoracic aortic aneurysm, without rupture, unspecified: Secondary | ICD-10-CM

## 2017-03-07 DIAGNOSIS — I2510 Atherosclerotic heart disease of native coronary artery without angina pectoris: Secondary | ICD-10-CM | POA: Diagnosis not present

## 2017-03-07 DIAGNOSIS — I110 Hypertensive heart disease with heart failure: Secondary | ICD-10-CM | POA: Insufficient documentation

## 2017-03-07 DIAGNOSIS — M25562 Pain in left knee: Secondary | ICD-10-CM

## 2017-03-07 DIAGNOSIS — Z7901 Long term (current) use of anticoagulants: Secondary | ICD-10-CM | POA: Insufficient documentation

## 2017-03-07 DIAGNOSIS — I5032 Chronic diastolic (congestive) heart failure: Secondary | ICD-10-CM

## 2017-03-07 DIAGNOSIS — I639 Cerebral infarction, unspecified: Secondary | ICD-10-CM

## 2017-03-07 DIAGNOSIS — I10 Essential (primary) hypertension: Secondary | ICD-10-CM

## 2017-03-07 DIAGNOSIS — I4891 Unspecified atrial fibrillation: Secondary | ICD-10-CM | POA: Diagnosis not present

## 2017-03-07 DIAGNOSIS — I480 Paroxysmal atrial fibrillation: Secondary | ICD-10-CM | POA: Insufficient documentation

## 2017-03-07 DIAGNOSIS — Z8673 Personal history of transient ischemic attack (TIA), and cerebral infarction without residual deficits: Secondary | ICD-10-CM | POA: Diagnosis not present

## 2017-03-07 DIAGNOSIS — E119 Type 2 diabetes mellitus without complications: Secondary | ICD-10-CM

## 2017-03-07 DIAGNOSIS — E785 Hyperlipidemia, unspecified: Secondary | ICD-10-CM | POA: Insufficient documentation

## 2017-03-07 DIAGNOSIS — I080 Rheumatic disorders of both mitral and aortic valves: Secondary | ICD-10-CM | POA: Diagnosis not present

## 2017-03-07 DIAGNOSIS — M25569 Pain in unspecified knee: Secondary | ICD-10-CM | POA: Diagnosis not present

## 2017-03-07 DIAGNOSIS — F17210 Nicotine dependence, cigarettes, uncomplicated: Secondary | ICD-10-CM | POA: Insufficient documentation

## 2017-03-07 LAB — GLUCOSE, POCT (MANUAL RESULT ENTRY): POC Glucose: 148 mg/dl — AB (ref 70–99)

## 2017-03-07 LAB — POCT GLYCOSYLATED HEMOGLOBIN (HGB A1C): Hemoglobin A1C: 5.9

## 2017-03-07 MED ORDER — TRAMADOL HCL 50 MG PO TABS: 50.0000 mg | ORAL_TABLET | Freq: Two times a day (BID) | ORAL | 0 refills | Status: DC | PRN

## 2017-03-07 NOTE — Progress Notes (Signed)
Subjective:    Patient ID: Linda [name], female    DOB: Sep 18, 1940, 76 y.o.   MRN: [id number]  HPI Linda [name] is a 76 year old [place] female with a medical history significant for significant for Type 2 DM (A1c 5.9)atrial fibrillation (s/p cardioversion in 08/2016, currently on rate control with Diltiazem and anticoagulation with Xarelto)history of coronary artery disease with EF of 55-60%, grade 2 diastolic dysfunction, mild aortic valvular regurgitation, mild mitral valvular regurgitation per 2-D echo 10/13/2016, hyperlipidemia, hypertension, 4.1 cm ascending aortic aneurysm ,tobacco abuse here for follow-up visit.  She is accompanied by her granddaughter today for follow-up visit and review of notes from care everywhere indicates she has [facility], [address], [place]. Labs reviewed - total cholesterol of 159, LDL 91, HDL of 39, triglycerides of 145.  Compliant with all her medications and denies side effects. Denies hypoglycemia, visual complaints or numbness in extremities.  She complains of three-week history of left knee pain and has applied OTC bengay with some relief; pain is described as mild and is worse on walking. She has a 4.1 cm ascending rustic aorta aneurysm without dissection identified on CT angiogram of the chest from 09/2016 and one year follow-up was recommended as per vascular surgery.   Past Medical History:  Diagnosis Date  . Atrial fibrillation (HCC)    dx in setting of stroke 4/12;  echo with EF 60-65%, mild LVH, mild AI, grade 1 diast dysfxn  . Coronary artery disease   . Diabetes mellitus without complication (HCC)   . History of CVA (cerebrovascular accident)    New diagnosis December 08, 2010  . Hx of cardiovascular stress test    a. Myoview 8/12:  EF 69%, no ischemia.   . Hyperlipidemia   . Hypertension   . Impaired glucose tolerance 05/08/2011  . Stroke [facility]) 2012   no deficits  . Tobacco  abuse    Ongoing (3-6 cigarettes per day, 25 pack year history)    Past Surgical History:  Procedure Laterality Date  . COLONOSCOPY N/A 02/01/2016   Procedure: COLONOSCOPY;  Surgeon: Ruffin FrederickSteven Paul Armbruster, MD;  [facility] END[facility];  Service: Gastroenterology;  Laterality: N/A;    No Known Allergies   Review of Systems  Constitutional: Negative for activity change, appetite change and fatigue.  HENT: Negative for congestion, sinus pressure and sore throat.   Eyes: Negative for visual disturbance.  Respiratory: Negative for cough, chest tightness, shortness of breath and wheezing.   Cardiovascular: Negative for chest pain and palpitations.  Gastrointestinal: Negative for abdominal distention, abdominal pain and constipation.  Endocrine: Negative for polydipsia.  Genitourinary: Negative for dysuria and frequency.  Musculoskeletal:       See hpi  Skin: Negative for rash.  Neurological: Negative for tremors, light-headedness and numbness.  Hematological: Does not bruise/bleed easily.  Psychiatric/Behavioral: Negative for agitation and behavioral problems. The patient is not nervous/anxious.        Objective: Vitals:   03/07/17 1557  BP: 123/69  Pulse: (!) 49  Temp: 98.8 F (37.1 C)  TempSrc: Oral  SpO2: 99%  Weight: 111 lb (50.3 kg)      Physical Exam Constitutional: She is oriented to person, place, and time. She appears well-developed and well-nourished.  Neck: No JVD present.  Cardiovascular: bradycardic rate, normal heart sounds and intact distal pulses.   No murmur heard. Pulmonary/Chest: Effort normal and breath sounds normal. She has no wheezes. She has no rales. She  exhibits no tenderness.  Abdominal: Soft. Bowel sounds are normal. She exhibits no distension and no mass. There is no tenderness.  Musculoskeletal: Left knee crepitus on range of motion, no tenderness elicited  Neurological: She is alert and oriented to person, place, and time.  Skin: Skin is  warm and dry.  Psychiatric: She has a normal mood and affect.        Lab Results  Component Value Date   HGBA1C 5.9 03/07/2017    Assessment & Plan:  1.Hypertension Controlled Low-sodium, DASH diet  2. PAF (paroxysmal atrial fibrillation) (HCC) Status post cardioversion Currently on anticoagulation with Xarelto Rate control with diltiazem Keep appointment with EP physician  3. Cerebrovascular accident (CVA), unspecified mechanism (HCC) No residual deficit Risk factor modification  4. Chronic diastolic CHF (congestive heart failure) (HCC) EF of 55-60% from 2-D echo 09/2016 Euvolemic   5. Diabetes mellitus Controlled with A1c of 5.9 - discussed warning signs of hypoglycemia and treatment. Continue metformin Diabetic health care maintenance at next visit  6. Thoracic aortic aneurysm 4.1 cm Follow-up CT in 1 year-due 09/2017  7. Knee pain Could be underlying osteoarthritis Placed on tramadol as NSAIDs are contraindicated due to ongoing anticoagulation with Xarelto   She does not need to have 2 primary care physicians and I have discussed with patient and caregiver regarding this; they will decide if they would like to follow-up with [facility] regional physicians or here in the clinic and make an appointment as deemed fit.

## 2017-04-04 ENCOUNTER — Other Ambulatory Visit: Payer: Self-pay | Admitting: Family Medicine

## 2017-05-30 ENCOUNTER — Other Ambulatory Visit: Payer: Self-pay | Admitting: Internal Medicine

## 2017-06-02 IMAGING — MR MR ABDOMEN WO/W CM
12 of 19 series · 20 of 48 positions shown · IV contrast (multihance)
Comparison: CT scan 01/24/2016

CLINICAL DATA: Possible perforated appendicitis. Evaluate prominent
pancreatic duct and right adrenal gland nodule and ovarian cysts.

EXAM:
MRI ABDOMEN AND PELVIS WITHOUT AND WITH CONTRAST
TECHNIQUE: Multiplanar multisequence MR imaging of the abdomen and pelvis was
performed both before and after the administration of intravenous
contrast.
CONTRAST:  12 cc MultiHance.

[Series 3: cor ssfse pelvis · coronal · 6.0mm · 0.86mm/px · 1 of 27 slices shown]
[im 1/27]
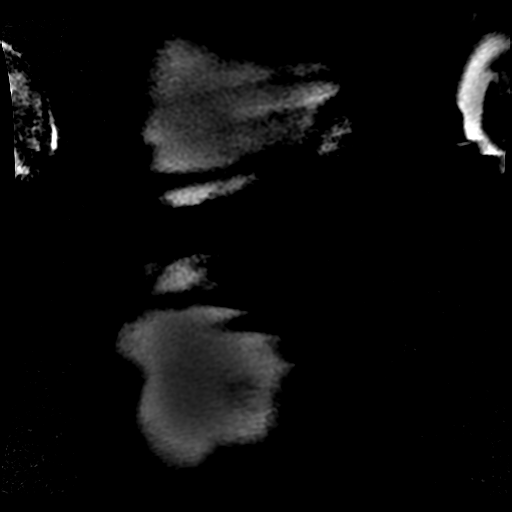

[Series 6: T2 · sagittal · 5.0mm · 0.55mm/px · 1 of 45 slices shown]
[im 1/45]
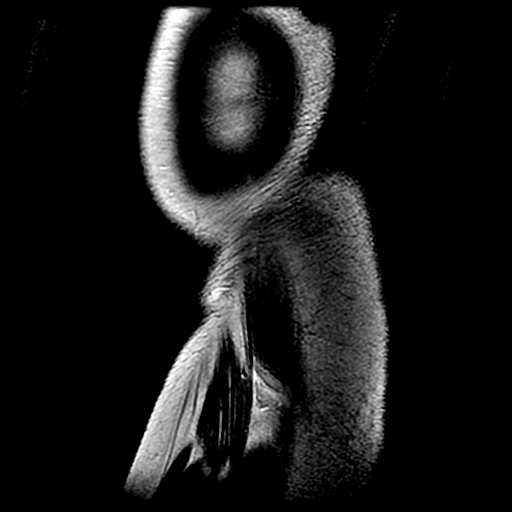

[Series 7: T2 fat-sat · axial · 5.0mm · 0.64mm/px · 1 of 29 slices shown (1 of 2)]
[im 1/29]
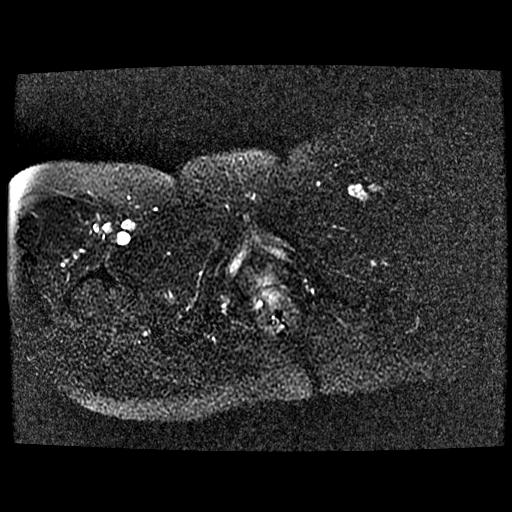

[Series 8: T1 · axial · 5.0mm · 0.64mm/px · 1 of 29 slices shown (1 of 2)]
[im 1/29]
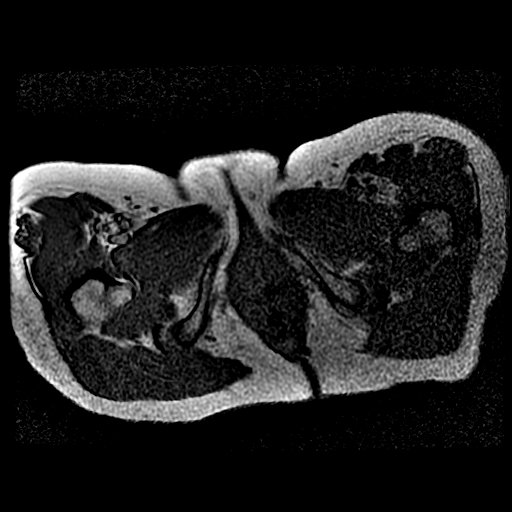

[Series 9: T1 fat-sat · axial · non-contrast · 5.0mm · 0.64mm/px · 1 of 29 slices shown (1 of 2)]
[im 1/29]
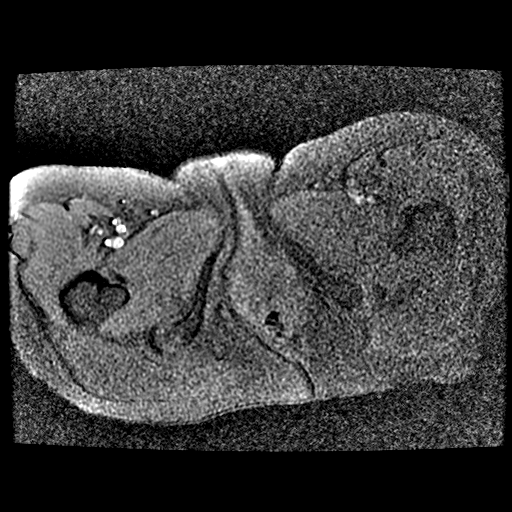

[Series 11: T2 fat-sat · axial · 6.0mm · 0.70mm/px · 1 of 41 slices shown (2 of 2)]
[im 1/41]
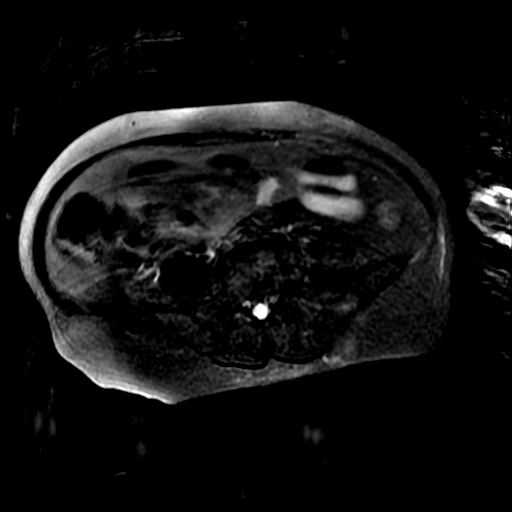

[Series 14: bSSFP · axial · 3.0mm · 0.76mm/px · z∈[-161,+135]mm · 2 of 75 slices shown]
[im 1/75]
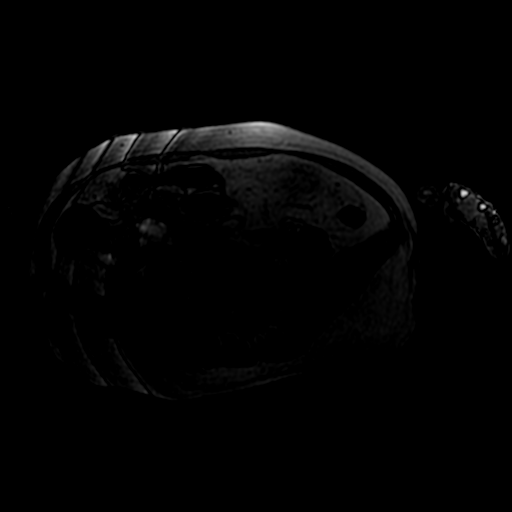
[im 75/75]
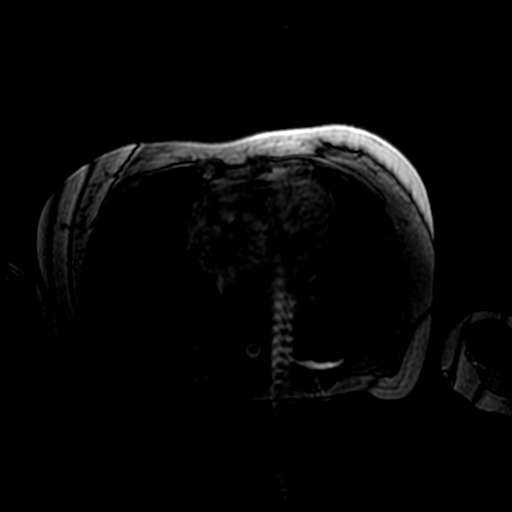

[Series 16: T1 · axial · 6.0mm · 0.70mm/px · z∈[-163,+152]mm · 2 of 72 slices shown (2 of 2)]
[im 1/72]
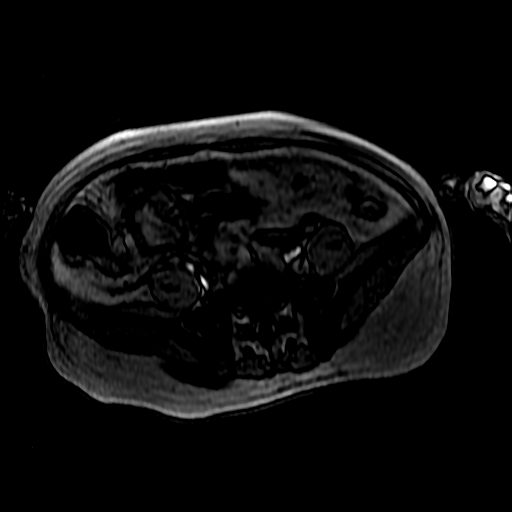
[im 72/72]
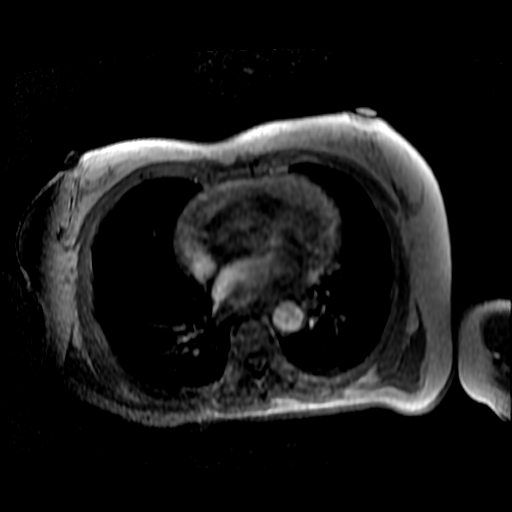

[Series 21: T1 dynamic post-contrast · coronal · 4.0mm · 0.78mm/px · 3 of 104 slices shown]
[im 1/104]
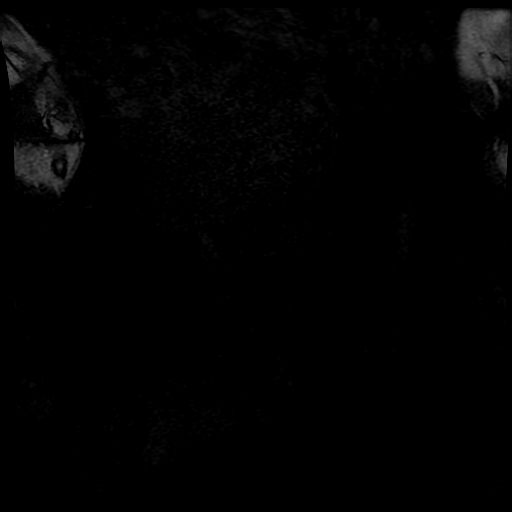
[im 52/104]
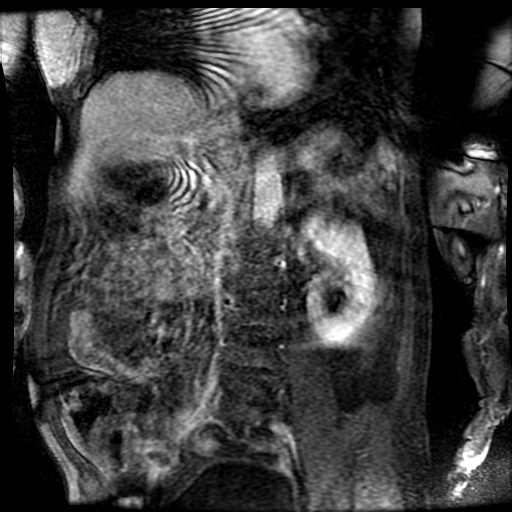
[im 104/104]
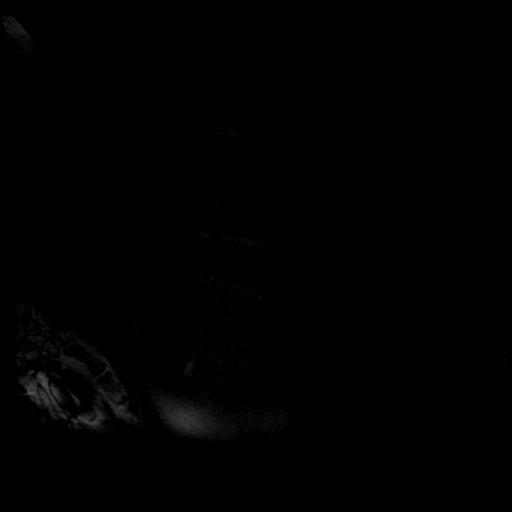

[Series 22: T1 fat-sat · axial · 5.0mm · 0.64mm/px · 1 of 29 slices shown (2 of 2)]
[im 1/29]
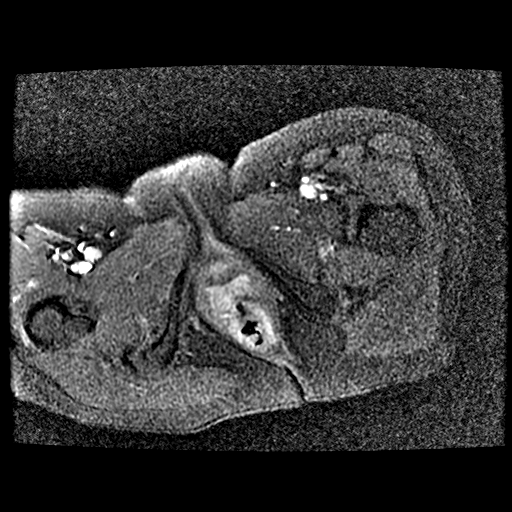

[Series 23: T1 dynamic · axial · 4.4mm · 0.74mm/px · z∈[-105,+165]mm · 4 of 124 slices shown]
[im 1/124]
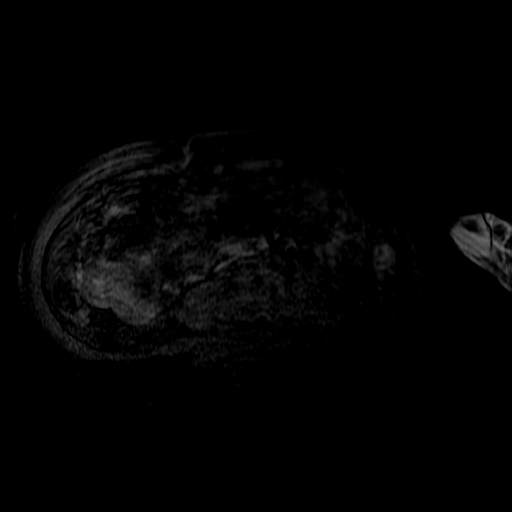
[im 42/124]
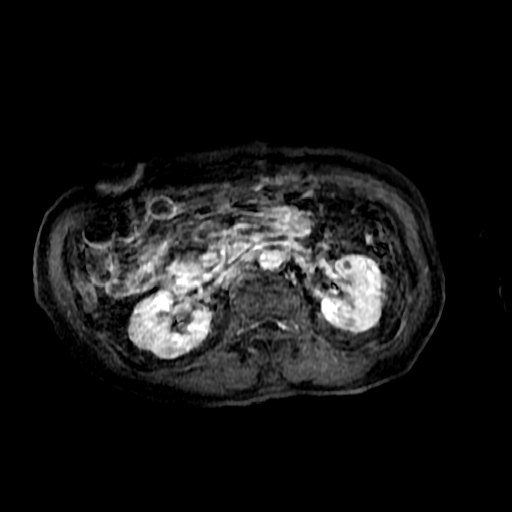
[im 83/124]
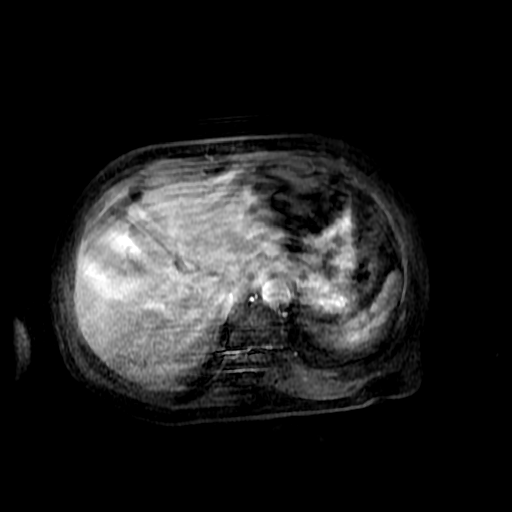
[im 124/124]
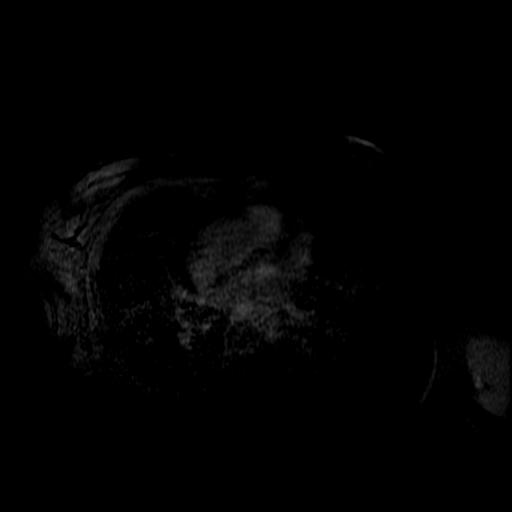

[Series 24: ax ssfse (abd) · axial · 5.0mm · 1.48mm/px · z∈[-154,+136]mm · 2 of 59 slices shown]
[im 1/59]
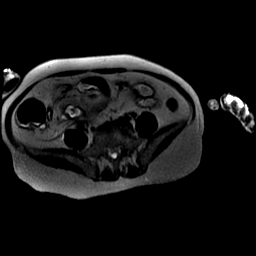
[im 59/59]
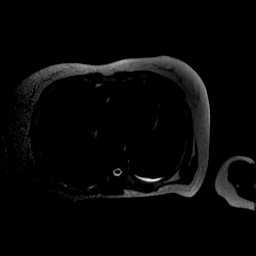

[20 of 48 positions shown; findings below may reference images not displayed]

FINDINGS: COMBINED FINDINGS FOR BOTH MR ABDOMEN AND PELVIS

Examination is quite limited due to respiratory motion and patient
motion.

Lower chest: The lung bases are grossly clear. No pleural or
pericardial effusion.

Hepatobiliary: No focal hepatic lesions or intrahepatic biliary
dilatation. The gallbladder is mildly distended. No findings for
acute cholecystitis. No definite gallstones. No common bile duct
dilatation.

Pancreas: No mass, inflammation ductal dilatation. Mild atrophy of
the pancreatic tail.

Spleen:  Normal size.  No focal lesions.

Adrenals/Urinary Tract: Small bilateral adrenal gland nodules
demonstrate loss of signal intensity on the out of phase T1 weighted
gradient echo sequences most consistent with benign adenomas.

The kidneys are unremarkable.

Stomach/Bowel: The stomach, duodenum, small bowel and colon are
grossly normal. The appendix is not well visualized due to motion
artifact.

Vascular/Lymphatic: No mesenteric or retroperitoneal mass or
abnormal liver aorta is normal in caliber advanced atherosclerotic
calcifications.

Reproductive: The uterus is grossly normal. There is a simple
appearing cyst associated with the right ovary measuring 2.4 cm.
Serpiginous fluid collections near the left ovary likely
hydrosalpinx.

Other: Moderate distention of the bladder is noted. No pelvic mass
or adenopathy. Presacral edema is no minimal fluid in the
cul-de-sac.

Musculoskeletal:  Advanced degenerative changes involving the spine.
IMPRESSION: 1. Very limited examination due to patient motion.
2. Small adrenal gland nodules are likely benign adenomas.
3. Suspect mild right lower quadrant inflammatory process but the
appendix cystic well visualized.
4. Right ovarian cyst and suspect left-sided hydrosalpinx.
5. Bladder distention.
6. Grossly normal appearance of the pancreaticobiliary tree for age.

## 2017-06-28 ENCOUNTER — Other Ambulatory Visit: Payer: Self-pay | Admitting: Internal Medicine

## 2017-08-03 DIAGNOSIS — E7800 Pure hypercholesterolemia, unspecified: Secondary | ICD-10-CM | POA: Diagnosis not present

## 2017-08-03 DIAGNOSIS — E119 Type 2 diabetes mellitus without complications: Secondary | ICD-10-CM | POA: Diagnosis not present

## 2017-08-03 DIAGNOSIS — I10 Essential (primary) hypertension: Secondary | ICD-10-CM | POA: Diagnosis not present

## 2017-08-03 DIAGNOSIS — M109 Gout, unspecified: Secondary | ICD-10-CM | POA: Diagnosis not present

## 2017-08-03 DIAGNOSIS — Z79899 Other long term (current) drug therapy: Secondary | ICD-10-CM | POA: Diagnosis not present

## 2017-09-12 DIAGNOSIS — R0602 Shortness of breath: Secondary | ICD-10-CM | POA: Diagnosis not present

## 2017-09-12 DIAGNOSIS — I10 Essential (primary) hypertension: Secondary | ICD-10-CM | POA: Diagnosis not present

## 2017-09-12 DIAGNOSIS — R5383 Other fatigue: Secondary | ICD-10-CM | POA: Diagnosis not present

## 2017-09-12 DIAGNOSIS — Z79899 Other long term (current) drug therapy: Secondary | ICD-10-CM | POA: Diagnosis not present

## 2017-09-12 DIAGNOSIS — E7800 Pure hypercholesterolemia, unspecified: Secondary | ICD-10-CM | POA: Diagnosis not present

## 2017-09-12 DIAGNOSIS — R8290 Unspecified abnormal findings in urine: Secondary | ICD-10-CM | POA: Diagnosis not present

## 2017-09-12 DIAGNOSIS — M109 Gout, unspecified: Secondary | ICD-10-CM | POA: Diagnosis not present

## 2017-09-12 DIAGNOSIS — I482 Chronic atrial fibrillation: Secondary | ICD-10-CM | POA: Diagnosis not present

## 2017-09-12 DIAGNOSIS — E559 Vitamin D deficiency, unspecified: Secondary | ICD-10-CM | POA: Diagnosis not present

## 2017-09-12 DIAGNOSIS — Z0000 Encounter for general adult medical examination without abnormal findings: Secondary | ICD-10-CM | POA: Diagnosis not present

## 2017-09-12 DIAGNOSIS — E119 Type 2 diabetes mellitus without complications: Secondary | ICD-10-CM | POA: Diagnosis not present

## 2017-09-12 DIAGNOSIS — R69 Illness, unspecified: Secondary | ICD-10-CM | POA: Diagnosis not present

## 2017-09-12 DIAGNOSIS — Z113 Encounter for screening for infections with a predominantly sexual mode of transmission: Secondary | ICD-10-CM | POA: Diagnosis not present

## 2017-09-27 DIAGNOSIS — I10 Essential (primary) hypertension: Secondary | ICD-10-CM | POA: Diagnosis not present

## 2017-09-27 DIAGNOSIS — E7800 Pure hypercholesterolemia, unspecified: Secondary | ICD-10-CM | POA: Diagnosis not present

## 2017-09-27 DIAGNOSIS — R69 Illness, unspecified: Secondary | ICD-10-CM | POA: Diagnosis not present

## 2017-09-27 DIAGNOSIS — Z780 Asymptomatic menopausal state: Secondary | ICD-10-CM | POA: Diagnosis not present

## 2017-09-27 DIAGNOSIS — E119 Type 2 diabetes mellitus without complications: Secondary | ICD-10-CM | POA: Diagnosis not present

## 2017-09-27 DIAGNOSIS — Z1331 Encounter for screening for depression: Secondary | ICD-10-CM | POA: Diagnosis not present

## 2017-09-27 DIAGNOSIS — I712 Thoracic aortic aneurysm, without rupture: Secondary | ICD-10-CM | POA: Diagnosis not present

## 2017-09-27 DIAGNOSIS — Z1339 Encounter for screening examination for other mental health and behavioral disorders: Secondary | ICD-10-CM | POA: Diagnosis not present

## 2017-09-28 DIAGNOSIS — I340 Nonrheumatic mitral (valve) insufficiency: Secondary | ICD-10-CM | POA: Diagnosis not present

## 2017-09-28 DIAGNOSIS — I6350 Cerebral infarction due to unspecified occlusion or stenosis of unspecified cerebral artery: Secondary | ICD-10-CM | POA: Diagnosis not present

## 2017-09-28 DIAGNOSIS — I482 Chronic atrial fibrillation: Secondary | ICD-10-CM | POA: Diagnosis not present

## 2017-09-28 DIAGNOSIS — I517 Cardiomegaly: Secondary | ICD-10-CM | POA: Diagnosis not present

## 2017-09-28 DIAGNOSIS — I351 Nonrheumatic aortic (valve) insufficiency: Secondary | ICD-10-CM | POA: Diagnosis not present

## 2017-10-09 DIAGNOSIS — R3129 Other microscopic hematuria: Secondary | ICD-10-CM | POA: Diagnosis not present

## 2017-10-09 DIAGNOSIS — M79606 Pain in leg, unspecified: Secondary | ICD-10-CM | POA: Diagnosis not present

## 2017-10-09 DIAGNOSIS — R0989 Other specified symptoms and signs involving the circulatory and respiratory systems: Secondary | ICD-10-CM | POA: Diagnosis not present

## 2017-10-25 DIAGNOSIS — I712 Thoracic aortic aneurysm, without rupture: Secondary | ICD-10-CM | POA: Diagnosis not present

## 2017-11-28 ENCOUNTER — Ambulatory Visit: Payer: Medicare HMO | Admitting: Family

## 2017-11-28 ENCOUNTER — Encounter (HOSPITAL_COMMUNITY): Payer: Medicare HMO

## 2017-11-30 ENCOUNTER — Other Ambulatory Visit: Payer: Self-pay

## 2017-11-30 ENCOUNTER — Ambulatory Visit (HOSPITAL_COMMUNITY)
Admission: RE | Admit: 2017-11-30 | Discharge: 2017-11-30 | Disposition: A | Discharge status: Home / Self Care | Payer: Medicare HMO | Source: Ambulatory Visit | Attending: Vascular Surgery | Admitting: Vascular Surgery

## 2017-11-30 ENCOUNTER — Ambulatory Visit (INDEPENDENT_AMBULATORY_CARE_PROVIDER_SITE_OTHER): Payer: Medicare HMO | Admitting: Family

## 2017-11-30 ENCOUNTER — Encounter: Payer: Self-pay | Admitting: Family

## 2017-11-30 VITALS — BP 128/63 | HR 59 | Temp 97.9°F | Resp 14 | Ht 59.5 in | Wt 107.0 lb

## 2017-11-30 DIAGNOSIS — I712 Thoracic aortic aneurysm, without rupture, unspecified: Secondary | ICD-10-CM

## 2017-11-30 DIAGNOSIS — F17200 Nicotine dependence, unspecified, uncomplicated: Secondary | ICD-10-CM | POA: Insufficient documentation

## 2017-11-30 DIAGNOSIS — K551 Chronic vascular disorders of intestine: Secondary | ICD-10-CM

## 2017-11-30 DIAGNOSIS — E119 Type 2 diabetes mellitus without complications: Secondary | ICD-10-CM | POA: Insufficient documentation

## 2017-11-30 DIAGNOSIS — E785 Hyperlipidemia, unspecified: Secondary | ICD-10-CM | POA: Insufficient documentation

## 2017-11-30 DIAGNOSIS — I774 Celiac artery compression syndrome: Secondary | ICD-10-CM | POA: Insufficient documentation

## 2017-11-30 DIAGNOSIS — I10 Essential (primary) hypertension: Secondary | ICD-10-CM | POA: Insufficient documentation

## 2017-11-30 DIAGNOSIS — R69 Illness, unspecified: Secondary | ICD-10-CM | POA: Diagnosis not present

## 2017-11-30 NOTE — Progress Notes (Signed)
CC: Follow up Mesenteric Ischemia   History of Present Illness  Linda [name] is a 77 y.o. ([birth year]) female who follows up from recent hospitalization where she was found to have splenic and renal infarcts with known atrial fibrillation not on anticoagulation. At that time we obtained CT angiogram of her chest which did not demonstrate any source of embolism but she did have known calcification of much of her aorta also at the ostia of the celiac and SMA. She follows up today with mesenteric duplex. History obtained through her nephew on initial visit demonstrates that she is eating well without much abdominal pain. She does have some fluttering in her chest which is a known problem. She is otherwise without issue at her normal level of activity taking Xarelto as scheduled.   Dr. Randie Heinz last evaluated pt on 11-24-16. At that time mesenteric duplex demonstrated greater than 70% celiac stenosis, less than 70% SMA stenosis and flow in her IMA. Dr. Randie Heinz  also reviewed her CT angiogram of her chest and abdomen from the hospital which did not demonstrate any source of embolic disease but she did have significant calcification of her aorta. She also had an enlarged aortic root to 4 cm. She is maintained on Xarelto and has calcification of her aorta by duplex that day demonstrating stenosis of her so iliac artery with less than 70% in her SMA and a patent IMA. She was not having abdominal symptoms and was eating without issues. She was to follow-up in 1 year with repeat mesenteric duplex at that time. She has a 4 cm ascending aorta for which she should be seen by thoracic surgery and interval follow-up with CT scan.  Pt is with a [place] interpreter.  She denies post prandial abdominal pain, denies food fear. She states her appetite is not good for the last couple of months.  She lost 2 pounds since her visit a year ago.   Her left thigh feels tired after walking about 20 meters, her left leg has felt  weak since her stroke, no problems with her left arm.  2011 and 2013 strokes, no other residual neurological deficit.  Diabetic: she does not know, last A1C result on file was 5.9 on 03-07-17, she takes metformin  Tobacco use: 4 cigarettes/day, started smoking at age 40 years   Past Medical History:  Diagnosis Date  . Atrial fibrillation (HCC)    dx in setting of stroke 4/12;  echo with EF 60-65%, mild LVH, mild AI, grade 1 diast dysfxn  . Coronary artery disease   . Diabetes mellitus without complication (HCC)   . History of CVA (cerebrovascular accident)    New diagnosis December 08, 2010  . Hx of cardiovascular stress test    a. Myoview 8/12:  EF 69%, no ischemia.   . Hyperlipidemia   . Hypertension   . Impaired glucose tolerance 05/08/2011  . Stroke [facility]) 2012   no deficits  . Tobacco abuse    Ongoing (3-6 cigarettes per day, 25 pack year history)    Social History Social History   Tobacco Use  . Smoking status: Light Tobacco Smoker    Packs/day: 0.25    Years: 25.00    Pack years: 6.25    Types: Cigarettes  . Smokeless tobacco: Never Used  . Tobacco comment: Down to 1 cigarette per day.   Substance Use Topics  . Alcohol use: No  . Drug use: No    Family History Family History  Problem  Relation Age of Onset  . Coronary artery disease Neg Hx     Surgical History Past Surgical History:  Procedure Laterality Date  . COLONOSCOPY N/A 02/01/2016   Procedure: COLONOSCOPY;  Surgeon: Ruffin Frederick, MD;  Location: [facility] ENDOSCOPY;  Service: Gastroenterology;  Laterality: N/A;    No Known Allergies  Current Outpatient Medications  Medication Sig Dispense Refill  . atorvastatin (LIPITOR) 20 MG tablet TAKE 1 TABLET BY MOUTH EVERY DAY 30 tablet 5  . colchicine 0.6 MG tablet Take 1 tablet (0.6 mg total) by mouth daily. 30 tablet 0  . diltiazem (TIAZAC) 360 MG 24 hr capsule Take 1 capsule (360 mg total) by mouth daily. Please call and schedule an appointment in the  AFIB clinic 30 capsule 4  . lactulose (CHRONULAC) 10 GM/15ML solution Take 15 mLs (10 g total) by mouth 2 (two) times daily as needed for mild constipation. 946 mL 1  . lisinopril (PRINIVIL,ZESTRIL) 5 MG tablet TAKE 1 TABLET BY MOUTH EVERY DAY 30 tablet 4  . metFORMIN (GLUCOPHAGE) 500 MG tablet Take 500 mg by mouth 2 (two) times daily with a meal.    . pantoprazole (PROTONIX) 40 MG tablet TAKE 1 TABLET(40 MG) BY MOUTH DAILY 30 tablet 3  . traMADol (ULTRAM) 50 MG tablet Take 1 tablet (50 mg total) by mouth every 12 (twelve) hours as needed. 50 tablet 0  . XARELTO 20 MG TABS tablet TAKE 1 TABLET(20 MG) BY MOUTH DAILY WITH SUPPER 30 tablet 3   No current facility-administered medications for this visit.     ROS: see HPI for pertinent positives and negatives    Physical Examination  Vitals:   11/30/17 0907  BP: 128/63  Pulse: (!) 59  Resp: 14  Temp: 97.9 F (36.6 C)  TempSrc: Oral  SpO2: 98%  Weight: 107 lb (48.5 kg)  Height: 4' 11.5" (1.511 m)   Body mass index is 21.25 kg/m.  General: A&O x 3, WDWN, petite female HEENT: unremarkable, few missing teeth  Pulmonary: Sym exp, good air movt, CTAB, no rales, rhonchi, or wheezing. Cardiac: RRR, Nl S1, S2, no detected Murmur.  Vascular: Vessel Right Left  Radial 2+Palpable 2+Palpable  Carotid  without bruit  without bruit  Aorta Not palpable N/A  Femoral 2+Palpable 2+Palpable  Popliteal Not palpable Not palpable  PT Not Palpable Not Palpable  DP Not Palpable Not Palpable   Gastrointestinal: soft, NTND, -G/R, - HSM, - palpable masses, - CVAT. Musculoskeletal: M/S 5/5 throughout, Extremities without ischemic changes. Skin: No rash, no cellulitis, no ulcers noted.  Neurologic: Pain and light touch intact in extremities, Motor exam as listed above. CN 2-12 intact.    DATA  Mesenteric Duplex (Date: 11/30/2017):   Ao: 1.5 cm, largest diameter  Celiac artery origin: 247 cm/s  SMA proximal: 344 cm/s  SMA mid: 115  cm/s  CHA: 150 cm/s  Could not visualize splenic artery  Medical Decision Making  [name] is a 77 y.o. female who presents with:  chronic mesenteric ischemia. No post prandial abdominal pain, no food fear, 2 pounds weight loss in a year. Appetite decreased. She takes a statin and Xarelto.  Dr. Randie Heinz spoke with pt through the interrater.  Follow up in 6 months with mesenteric duplex, see me, let us know if she develops food fear. Referred to cardiothoracic surgery to follow ascending aortic aneurysm.   The patient was counseled re smoking cessation and given several free resources re smoking cessation.    I discussed in  depth with the patient the nature of atherosclerosis, and emphasized the importance of maximal medical management including strict control of blood pressure, blood glucose, and lipid levels, obtaining regular exercise, and cessation of smoking.    The patient is aware that without maximal medical management the underlying atherosclerotic disease process will progress, limiting the benefit of any interventions.  Charisse MarchSuzanne Nickel, RN, MSN, FNP-C [facility] [place] [place] Office: [phone]  Clinic MD: Randie Heinzain  11/30/2017, 9:20 AM

## 2017-11-30 NOTE — Patient Instructions (Addendum)
Steps to Quit Smoking Smoking tobacco can be bad for your health. It can also affect almost every organ in your body. Smoking puts you and people around you at risk for many serious long-lasting (chronic) diseases. Quitting smoking is hard, but it is one of the best things that you can do for your health. It is never too late to quit. What are the benefits of quitting smoking? When you quit smoking, you lower your risk for getting serious diseases and conditions. They can include:  Lung cancer or lung disease.  Heart disease.  Stroke.  Heart attack.  Not being able to have children (infertility).  Weak bones (osteoporosis) and broken bones (fractures).  If you have coughing, wheezing, and shortness of breath, those symptoms may get better when you quit. You may also get sick less often. If you are pregnant, quitting smoking can help to lower your chances of having a baby of low birth weight. What can I do to help me quit smoking? Talk with your doctor about what can help you quit smoking. Some things you can do (strategies) include:  Quitting smoking totally, instead of slowly cutting back how much you smoke over a period of time.  Going to in-person counseling. You are more likely to quit if you go to many counseling sessions.  Using resources and support systems, such as: ? Online chats with a counselor. ? Phone quitlines. ? Printed self-help materials. ? Support groups or group counseling. ? Text messaging programs. ? Mobile phone apps or applications.  Taking medicines. Some of these medicines may have nicotine in them. If you are pregnant or breastfeeding, do not take any medicines to quit smoking unless your doctor says it is okay. Talk with your doctor about counseling or other things that can help you.  Talk with your doctor about using more than one strategy at the same time, such as taking medicines while you are also going to in-person counseling. This can help make  quitting easier. What things can I do to make it easier to quit? Quitting smoking might feel very hard at first, but there is a lot that you can do to make it easier. Take these steps:  Talk to your family and friends. Ask them to support and encourage you.  Call phone quitlines, reach out to support groups, or work with a counselor.  Ask people who smoke to not smoke around you.  Avoid places that make you want (trigger) to smoke, such as: ? Bars. ? Parties. ? Smoke-break areas at work.  Spend time with people who do not smoke.  Lower the stress in your life. Stress can make you want to smoke. Try these things to help your stress: ? Getting regular exercise. ? Deep-breathing exercises. ? Yoga. ? Meditating. ? Doing a body scan. To do this, close your eyes, focus on one area of your body at a time from head to toe, and notice which parts of your body are tense. Try to relax the muscles in those areas.  Download or buy apps on your mobile phone or tablet that can help you stick to your quit plan. There are many free apps, such as QuitGuide from the CDC (Centers for Disease Control and Prevention). You can find more support from smokefree.gov and other websites.  This information is not intended to replace advice given to you by your health care provider. Make sure you discuss any questions you have with your health care provider. Document Released: 06/03/2009 Document   Revised: 04/04/2016 Document Reviewed: 12/22/2014 Elsevier Interactive Patient Education  2018 [employer].     Chronic Mesenteric Ischemia Mesenteric ischemia is poor blood flow (circulation) in the vessels that supply blood to the stomach, intestines, and liver (mesenteric organs). Chronic mesenteric ischemia, also called mesenteric angina or intestinal angina, is a long-term (chronic) condition. It happens when an artery or vein that provides blood to the mesenteric organs gradually becomes blocked or narrow,  restricting the blood supply to the organs. When the blood supply is severely restricted, the mesenteric organs cannot work properly. What are the causes? This condition is commonly caused by fatty deposits that build up in an artery (plaque), which can narrow the artery and restrict blood flow. Other causes include:  Weakened areas in blood vessel walls (aneurysms).  Conditions that cause twisting or inflammation of blood vessels, such as fibromuscular dysplasia or arteritis.  A disorder in which blood clots form in the veins (venous thrombosis).  Scarring and thickening (fibrosis) of blood vessels caused by radiation therapy.  A tear in the aorta, the body's main artery (aortic dissection).  Blood vessel problems after illegal drug use, such as use of cocaine.  Tumors in the nervous system (neurofibromatosis).  Certain autoimmune diseases, such as lupus.  What increases the risk? The following factors may make you more likely to develop this condition:  Being female.  Being over age 78, especially if you have a history of heart problems.  Smoking.  Congestive heart failure.  Irregular heartbeat (arrhythmia).  Having a history of heart attack or stroke.  Diabetes.  High cholesterol.  High blood pressure (hypertension).  Being overweight or obese.  Kidney disease (renal disease) requiring dialysis.  What are the signs or symptoms? Symptoms of this condition include:  Abdomen (abdominal) pain or cramps that develop 15-60 minutes after a meal. This pain may last for 1-3 hours. Some people may develop a fear of eating because of this symptom.  Weight loss.  Diarrhea.  Bloody stool.  Nausea.  Vomiting.  Bloating.  Abdominal pain after stress or with exercise.  How is this diagnosed? This condition is diagnosed based on:  Your medical history.  A physical exam.  Tests, such as: ? Ultrasound. ? CT scan. ? Blood tests. ? Urine tests. ? An imaging  test that involves injecting a dye into your arteries to show blood flow through blood vessels (angiogram). This can help to show if there are any blockages in the vessels that lead to the intestines. ? Passing a small probe through the mouth and into the stomach to measure the output of carbon dioxide (gastric tonometry). This can help to indicate whether there is decreased blood flow to the stomach and intestines.  How is this treated? This condition may be treated with:  Dietary changes such as eating smaller, low-fat, meals more frequently.  Lifestyle changes to treat underlying conditions that contribute to the disease, such as high cholesterol and high blood pressure.  Medicines to reduce blood clotting and increase blood flow.  Surgery to remove the blockage, repair arteries or veins, and restore blood flow. This may involve: ? Angioplasty. This is surgery to widen the affected artery, reduce the blockage, and sometimes insert a small, mesh tube (stent). ? Bypass surgery. This may be done to go around (bypass) the blockage and reconnect healthy arteries or veins. ? Placing a stent in the affected area. This may be done to help keep blocked arteries open.  Follow these instructions at home: Eating  and drinking  Eat a heart-healthy diet. This includes fresh fruits and vegetables, whole grains, and lean proteins like chicken, fish, eggs, and beans.  Avoid foods that contain a lot of: ? Salt (sodium). ? Sugar. ? Saturated fat (such as red meat). ? Trans fat (such as fried foods).  Stay hydrated. Drink enough fluid to keep your urine clear or pale yellow. Lifestyle  Stay active and get regular exercise as told by your health care provider. Aim for 150 minutes of moderate activity or 75 minutes of vigorous activity a week. Ask your health care provider what activities and forms of exercise are safe for you.  Maintain a healthy weight.  Work with your health care provider to manage  your cholesterol.  Manage any other health problems you have, such as high blood pressure, diabetes, or heart rhythm problems.  Do not use any products that contain nicotine or tobacco, such as cigarettes and e-cigarettes. If you need help quitting, ask your health care provider. General instructions  Take over-the-counter and prescription medicines only as told by your health care provider.  Keep all follow-up visits as told by your health care provider. This is important. Contact a health care provider if:  Your symptoms do not improve or they return after treatment.  You have a fever. Get help right away if:  You have severe abdominal pain.  You have severe chest pain.  You have shortness of breath.  You feel weak or dizzy.  You have palpitations.  You have numbness or weakness in your face, arm, or leg.  You are confused.  You have trouble speaking or people have trouble understanding what you are saying.  You are constipated.  You have trouble urinating.  You have blood in your stool.  You have severe nausea, vomiting, or persistent diarrhea. Summary  Mesenteric ischemia is poor circulation in the vessels that supply blood to the the stomach, intestines, and liver (mesenteric organs).  This condition happens when an artery or vein that provides blood to the mesenteric organs gradually becomes blocked or narrow, restricting the blood supply to the organs.  This condition is commonly caused by fatty deposits that build up in an artery (plaque), which can narrow the artery and restrict blood flow.  You are more likely to develop this condition if you are over age 52 and have a history of heart problems, high blood pressure, diabetes, or high cholesterol.  This condition is usually treated with medicines, dietary and lifestyle changes, and surgery to remove the blockage, repair arteries or veins, and restore blood flow. This information is not intended to replace  advice given to you by your health care provider. Make sure you discuss any questions you have with your health care provider. Document Released: 03/27/2011 Document Revised: 07/22/2016 Document Reviewed: 07/22/2016 Elsevier Interactive Patient Education  2017 Elsevier Inc.  Cc b??c b? ht thu?c Steps to Quit Smoking Ht thu?c c th? c h?i cho s?c kh?e c?a qu v? v c th? ?nh h??ng ??n h?u h?t cc c? quan trong c? th? qu v?. Ht thu?c s? lm qu v? v nh?ng ng??i xung quanh qu v? c nguy c? pht sinh nhi?u b?nh m?n tnh nghim tr?ng. Kh b? ht thu?c, nh?ng ? l m?t trong nh?ng ?i?u t?t nh?t qu v? c th? lm v s?c kh?e c?a qu v?. B? ht thu?c khng bao gi? qu mu?n. Nh?ng l?i ch c?a vi?c b? ht thu?c l g? Khi b? ht thu?c, qu v? s?  gi?m nguy c? pht sinh cc b?nh v tnh tr?ng nghim tr?ng, ch?ng h?n nh?:  Ung th? ph?i ho?c b?nh ph?i, ch?ng h?n nh? COPD.  B?nh tim.  ??t qu?Marland Kitchen  Nh?i mu c? tim.  V sinh.  Long x??ng v gy x??ng.  Ngoi ra, cc tri?u ch?ng nh? ho, th? kh kh, kh th? c th? ?? h?n khi qu v? b? ht thu?c. Qu v? c?ng c th? th?y t b? ?m h?n v c? th? qu v? c s?c ?? khng m?nh h?n v?i c?m l?nh v nhi?m trng. N?u qu v? mang thai, b? ht thu?c c th? gip gi?m nguy c? sinh con nh? cn. Ti chu?n b? s?n sng cho vi?c b? ht thu?c nh? th? no? N?u qu v? quy?t ??nh b? ht thu?c, hy l?p m?t k? ho?ch ?? ch?c ch?n qu v? s? thnh cng. Tr??c khi qu v? b? ht thu?c:  Hy ch?n m?t ngy ?? b? thu?c l. ??nh ra m?t ngy trong hai tu?n t?i ?? qu v? c th?i gian chu?n b?.  Ghi l?i nh?ng l do m qu v? b? ht thu?c. Gi? danh sch ny ? nh?ng n?i m qu v? nhn th?y th??ng xuyn, ch?ng h?n nh? ? g??ng c?a phng t?m ho?c trong xe h?i ho?c trong v.  Xc ??nh nh?ng ng??i, ??a ?i?m, ?? v?t v ho?t ??ng lm cho qu v? mu?n ht thu?c (tc nhn kh?i pht) v trnh nh?ng tc nhn ?. Hy ch?c ch?n th?c hi?n nh?ng hnh ??ng sau: ? V?t b? t?t c? thu?c l ? nh, n?i lm  vi?c v trn xe h?i. ? V?t b? cc ?? dng ?? ht thu?c l, ch?ng h?n nh? g?t tn v b?t l?a. ? V? sinh xe h?i v ch?c ch?n ph?i ?? b? g?t tn thu?c l. ? V? sinh nh c?a, k? c? rm c?a v th?m.  Ni v?i gia ?nh, b?n b v ??ng nghi?p r?ng qu v? ?ang b? thu?c l. Vi?c h? tr? c?a ng??i thn c th? gip qu v? b? ht thu?c d? dng h?n.  Hy ni chuy?n v?i chuyn gia ch?m [place] s?c kh?e v? nh?ng ph??ng n b? ht thu?c.  Tm hi?u nh?ng ph??ng n ?i?u tr? ???c b?o hi?m y t? c?a qu v? bao tr?Marland Kitchen  Ti c th? s? d?ng nh?ng chi?n l??c g ?? b? ht thu?c? Hy ni chuy?n v?i chuyn gia ch?m [place] s?c kh?e v? nh?ng chi?n l??c b? ht thu?c khc nhau. M?t s? chi?n l??c bao g?m:  B? ht thu?c hon ton thay v gi?m d?n l??ng thu?c ht trong m?t th?i gian. Cc nghin c?u cho th?y vi?c b? ht thu?c hon ton thnh cng h?n b? ht thu?c d?n d?n.  G?p xin t? v?n tr?c ti?p ?? gip xy d?ng k? n?ng gi?i quy?t v?n ??[place] v? c kh? n?ng thnh cng h?n trong vi?c b? ht thu?c n?u qu v? tham gia m?t vi bu?i t? v?n. Ngay c? nh?ng bu?i t? v?n ko di 10 pht c?ng c th? c hi?u qu?Marland Kitchen  Tm nh?ng ngu?n v h? th?ng h? tr? c th? gip qu v? b? ht thu?c v duy tr tnh tr?ng khng khi thu?c sau khi b? ht thu?c. Nh?ng ngu?n ny h?u ch nh?t khi qu v? s? d?ng th??ng xuyn. Nh?ng ngu?n ? c th? bao g?m: ? Tr chuy?n tr?c tuy?n v?i chuyn gia t? v?n. ? ???ng ?i?n tho?i t? v?n cai thu?c. ? Ti li?u in t? tham kh?o. ? Cc nhm h?  tr? ho?c t? v?n theo nhm. ? Cc ch??ng trnh g?i tin nh?n. ? Cc ?ng d?ng ?i?n tho?i di ??ng.  Dng thu?c ?? gip qu v? b? ht thu?c. (N?u qu v? mang thai ho?c ?ang cho con b, hy ni chuy?n tr??c v?i chuyn gia ch?m [place] s?c kh?e). M?t s? thu?c c ch?a nicotine v s? khc th khng. C? hai lo?i thu?c ??u gip b?t thm thu?c, nh?ng nh?ng thu?c c ch?a nicotine gip gi?m nh? cc tri?u ch?ng cai thu?c. Chuyn gia ch?m [place] s?c kh?e c?a qu v? c th? khuy?n ngh?: ? Mi?ng dn nicotine, k?o nhai ho?c  thu?c vin. ? ?ng ht ho?c bnh x?t nicotine. ? Thu?c khng c ch?a nicotine dng qua ???ng u?ng.  Ni chuy?n v?i chuyn gia ch?m [place] s?c kh?e v? vi?c k?t h?p cc chi?n l??c, ch?ng h?n nh? dng thu?c trong khi qu v? c?ng ?ang ???c t? v?n tr?c ti?p. S? d?ng hai chi?n l??c ny cng m?t lc lm cho qu v? c kh? n?ng thnh cng h?n trong vi?c b? ht thu?c so v?i khi qu v? ch? s? d?ng ring m?t chi?n l??c. N?u qu v? mang thai ho?c cho con b, hy ni chuy?n v?i chuyn gia ch?m [place] s?c kh?e v? vi?c tm cc ch??ng trnh t? v?n ho?c h? tr? khc ?? b? ht thu?c. Khng dng thu?c ?? b? ht thu?c tr? khi chuyn gia ch?m [place] s?c kh?e b?o qu v? lm nh? v?y. Ti c th? lm nh?ng vi?c g ?? b? ht thu?c d? h?n? B? ht thu?c lc ??u c v? kh kh?n, nh?ng c nhi?u vi?c qu v? c th? lm ?? cho vi?c ny d? dng h?n. Th?c hi?n nh?ng hnh ??ng quan tr?ng sau:  Nh? gia ?nh v b?n b v yu c?u h? h? tr? v ??ng vin trong th?i gian ny. G?i ?i?n ??n ???ng dy t? v?n cai nghi?n, nh? cc nhm h? tr? ho?c lm vi?c v?i m?t chuyn gia t? v?n ?? ???c h? tr?.  ?? ngh? nh?ng ng??i ht thu?c trnh ht thu?c c?nh qu v?.  Trnh nh?ng n?i khi?n qu v? mu?n ht thu?c, ch?ng h?n nh? qun r??u, ti?c lin hoan ho?c khu v?c gi?i lao c ht thu?c ? n?i lm vi?c.  Dnh th?i gian ? c?nh nh?ng ng??i khng ht thu?c.  Gi?m c?ng th?ng trong cu?c s?ng, v c?ng th?ng l tc nhn kh?i pht ht thu?c ??i v?i m?t s? ng??i. ?? gi?m c?ng th?ng, hy c? g?ng: ? T?p th? d?c th??ng xuyn. ? T?p cc bi t?p th? su. ? Yoga. ? Thi?n. ? Ch?p qut c? th?. Vi?c ny ?i h?i qu v? ph?i nh?m m?t, ch?p qut c? th? t? ??u ??n chn v pht hi?n nh?ng b? ph?n ??c bi?t c?ng th?ng c?a c? th? qu v?. Ch? tm th? l?ng cc c? ? nh?ng khu v?c ?.  T?i v? ho?c mua cc ch??ng trnh ?ng d?ng (?ng d?ng) dng cho ?i?n tho?i di ??ng ho?c my tnh b?ng c th? gip qu v? gi? v?ng k? ho?ch cai thu?c thng qua cc l?i nh?c, l?i khuyn v ??ng vin. C nhi?u ?ng  d?ng mi?n ph, ch?ng h?n QuitGuide c?a CDC (Trung tm ki?m sot v phng ch?ng b?nh t?t). Qu v? c th? tm s? tr? gip khc ?? b? ht thu?c (ng?ng ht thu?c) thng qua [email or web address] v cc website khc.  Ti s? c?m th?y nh? th? no khi b? ht thu?c? Trong vng 24 gi? ??u tin t? khi b?  ht thu?c, qu v? c th? b?t ??u c?m th?y m?t s? tri?u ch?ng cai thu?c. Nh?ng tri?u ch?ng ny th??ng d? nh?n ra nh?t trong 2-3 ngy sau khi b? thu?c, nh?ng th??ng khng ko di qu 2-3 tu?n. Nh?ng thay ??i ho?c tri?u ch?ng m qu v? c th? c?m nh?n bao g?m:  Thay ??i tm tr?ng.  B?n ch?n, lo l?ng ho?c d? cu k?nh.  Kh t?p trung.  Chng m?t.  Ngoi nicotine cn thm cc lo?i th?c ?n ng?t.  T?ng cn m?t cht.  To bn.  Bu?n nn.  Ho ho?c ?au h?ng.  Thay ??i c? ch? tc d?ng c?a cc lo?i thu?c trong c? th? qu v?.  Tr?ng thi tr?m c?m.  Kh ng? (m?t ng?).  Sau 2-3 tu?n ??u tin b? ht thu?c, qu v? c th? b?t ??u nh?n th?y cc k?t qu? tch c?c h?n, ch?ng h?n nh?:  C?i thi?n kh?u gic v v? gic.  Gi?m ho v gi?m ?au h?ng.  Nh?p tim ch?m h?n.  Huy?t p th?p h?n.  Da sng h?n.  Th? d? dng h?n.  t ngy ?m h?n.  B? ht thu?c l r?t kh ??i v?i h?u h?t m?i ng??i. Khng nh?t ch n?u qu v? khng thnh cng trong l?n ??u. M?t s? ng??i c?n nhi?u l?n c? g?ng b? ht thu?c tr??c khi h? c ???c thnh cng lu di. H?t s?c c? g?ng gi? v?ng k? ho?ch b? ht thu?c v ni chuy?n v?i chuyn gia ch?m [place] s?c kh?e n?u qu v? c b?t k? th?c m?c ho?c lo ng?i no. Thng tin ny khng nh?m m?c ?ch thay th? cho l?i khuyn m chuyn gia ch?m [place] s?c kh?e ni v?i qu v?. Hy b?o ??m qu v? ph?i th?o lu?n b?t k? v?n ?? g m qu v? c v?i chuyn gia ch?m [place] s?c kh?e c?a qu v?. Document Released: 11/22/2006 Document Revised: 11/23/2016 Document Reviewed: 12/22/2014 Elsevier Interactive Patient Education  2018 [employer].

## 2017-12-02 ENCOUNTER — Encounter: Payer: Self-pay | Admitting: Family

## 2017-12-31 ENCOUNTER — Other Ambulatory Visit: Payer: Self-pay

## 2017-12-31 DIAGNOSIS — K551 Chronic vascular disorders of intestine: Secondary | ICD-10-CM

## 2018-01-03 DIAGNOSIS — E119 Type 2 diabetes mellitus without complications: Secondary | ICD-10-CM | POA: Diagnosis not present

## 2018-01-03 DIAGNOSIS — I10 Essential (primary) hypertension: Secondary | ICD-10-CM | POA: Diagnosis not present

## 2018-01-03 DIAGNOSIS — E7800 Pure hypercholesterolemia, unspecified: Secondary | ICD-10-CM | POA: Diagnosis not present

## 2018-01-03 DIAGNOSIS — I712 Thoracic aortic aneurysm, without rupture: Secondary | ICD-10-CM | POA: Diagnosis not present

## 2018-01-03 DIAGNOSIS — R630 Anorexia: Secondary | ICD-10-CM | POA: Diagnosis not present

## 2018-01-03 DIAGNOSIS — R69 Illness, unspecified: Secondary | ICD-10-CM | POA: Diagnosis not present

## 2018-01-03 DIAGNOSIS — M109 Gout, unspecified: Secondary | ICD-10-CM | POA: Diagnosis not present

## 2018-01-03 DIAGNOSIS — Z79899 Other long term (current) drug therapy: Secondary | ICD-10-CM | POA: Diagnosis not present

## 2018-01-03 DIAGNOSIS — E559 Vitamin D deficiency, unspecified: Secondary | ICD-10-CM | POA: Diagnosis not present

## 2018-01-15 ENCOUNTER — Encounter: Payer: Medicare HMO | Admitting: Thoracic Surgery (Cardiothoracic Vascular Surgery)

## 2018-02-05 ENCOUNTER — Encounter: Payer: Medicare HMO | Admitting: Thoracic Surgery (Cardiothoracic Vascular Surgery)

## 2018-02-07 ENCOUNTER — Other Ambulatory Visit: Payer: Self-pay

## 2018-02-07 DIAGNOSIS — Z1231 Encounter for screening mammogram for malignant neoplasm of breast: Secondary | ICD-10-CM | POA: Diagnosis not present

## 2018-02-08 ENCOUNTER — Other Ambulatory Visit: Payer: Self-pay

## 2018-02-08 DIAGNOSIS — I712 Thoracic aortic aneurysm, without rupture, unspecified: Secondary | ICD-10-CM

## 2018-02-11 ENCOUNTER — Encounter: Payer: Self-pay | Admitting: *Deleted

## 2018-02-20 ENCOUNTER — Inpatient Hospital Stay: Admission: RE | Admit: 2018-02-20 | Payer: Medicare HMO | Source: Ambulatory Visit

## 2018-02-25 ENCOUNTER — Encounter: Payer: Medicare HMO | Admitting: Thoracic Surgery (Cardiothoracic Vascular Surgery)

## 2018-04-02 ENCOUNTER — Other Ambulatory Visit: Payer: Self-pay

## 2018-04-02 ENCOUNTER — Institutional Professional Consult (permissible substitution) (INDEPENDENT_AMBULATORY_CARE_PROVIDER_SITE_OTHER): Payer: Medicare HMO | Admitting: Thoracic Surgery (Cardiothoracic Vascular Surgery)

## 2018-04-02 ENCOUNTER — Encounter: Payer: Self-pay | Admitting: Thoracic Surgery (Cardiothoracic Vascular Surgery)

## 2018-04-02 VITALS — BP 100/70 | HR 77 | Resp 16 | Ht 59.5 in | Wt 108.2 lb

## 2018-04-02 DIAGNOSIS — I4819 Other persistent atrial fibrillation: Secondary | ICD-10-CM

## 2018-04-02 DIAGNOSIS — I712 Thoracic aortic aneurysm, without rupture, unspecified: Secondary | ICD-10-CM

## 2018-04-02 DIAGNOSIS — I481 Persistent atrial fibrillation: Secondary | ICD-10-CM | POA: Diagnosis not present

## 2018-04-02 NOTE — Progress Notes (Signed)
PCP is Hoy RegisterNewlin, Enobong, MD Referring Provider is Nickel, Carma LairSuzanne L, NP  Chief Complaint  Patient presents with  . TAA    CT CHEST and ECHO 02/11/18    HPI: Linda [name] sent for consultation regarding an ascending aneurysm.  Dwyane LuoDoan Situ is a 77 year old Asian woman with a past medical history significant for embolic stroke, atrial fibrillation, coronary artery disease, type 2 diabetes mellitus without complication, hypertension, hyperlipidemia, and tobacco abuse.  She does not speak [place].  A neighbor/friend accompanied her and is serving as the interpreter is a professional interpreter was not available.  She is a poor historian.  History primarily derived from old records.  Linda [name] has known atrial fibrillation.  She had an embolic stroke in 2012.  She also has a history of thromboembolic splenic and renal infarcts.  She is followed by Dr. Pascal LuxKane for celiac and mesenteric stenoses.  A CT angiogram of her chest and February 2018 showed aortic atherosclerosis and a 4.1 cm ascending aneurysm.  She recently saw Rosalita ChessmanSuzanne Nickel for a follow-up.  She ordered a repeat CT angiogram and an echocardiogram and now refers her to us.  She denies any chest pain, pressure, or tightness.  She denies shortness of breath with routine activities.  She denies any swelling in her legs.  Past Medical History:  Diagnosis Date  . Atrial fibrillation (HCC)    dx in setting of stroke 4/12;  echo with EF 60-65%, mild LVH, mild AI, grade 1 diast dysfxn  . Coronary artery disease   . Diabetes mellitus without complication (HCC)   . History of CVA (cerebrovascular accident)    New diagnosis December 08, 2010  . Hx of cardiovascular stress test    a. Myoview 8/12:  EF 69%, no ischemia.   . Hyperlipidemia   . Hypertension   . Impaired glucose tolerance 05/08/2011  . Stroke [facility](HCC) 2012   no deficits  . Tobacco abuse    Ongoing (3-6 cigarettes per day, 25 pack year history)    Past Surgical History:  Procedure Laterality  Date  . COLONOSCOPY N/A 02/01/2016   Procedure: COLONOSCOPY;  Surgeon: Ruffin FrederickSteven Paul Armbruster, MD;  [facility];  Service: Gastroenterology;  Laterality: N/A;    Family History  Problem Relation Age of Onset  . Coronary artery disease Neg Hx     Social History Social History   Tobacco Use  . Smoking status: Light Tobacco Smoker    Packs/day: 0.25    Years: 25.00    Pack years: 6.25    Types: Cigarettes  . Smokeless tobacco: Never Used  . Tobacco comment: Down to 1 cigarette per day.   Substance Use Topics  . Alcohol use: No  . Drug use: No    Current Outpatient Medications  Medication Sig Dispense Refill  . atorvastatin (LIPITOR) 20 MG tablet TAKE 1 TABLET BY MOUTH EVERY DAY 30 tablet 5  . colchicine 0.6 MG tablet Take 1 tablet (0.6 mg total) by mouth daily. 30 tablet 0  . diltiazem (TIAZAC) 360 MG 24 hr capsule Take 1 capsule (360 mg total) by mouth daily. Please call and schedule an appointment in the AFIB clinic 30 capsule 4  . lactulose (CHRONULAC) 10 GM/15ML solution Take 15 mLs (10 g total) by mouth 2 (two) times daily as needed for mild constipation. 946 mL 1  . lisinopril (PRINIVIL,ZESTRIL) 5 MG tablet TAKE 1 TABLET BY MOUTH EVERY DAY 30 tablet 4  . metFORMIN (GLUCOPHAGE) 500 MG tablet Take 500 mg by  mouth 2 (two) times daily with a meal.    . pantoprazole (PROTONIX) 40 MG tablet TAKE 1 TABLET(40 MG) BY MOUTH DAILY 30 tablet 3  . traMADol (ULTRAM) 50 MG tablet Take 1 tablet (50 mg total) by mouth every 12 (twelve) hours as needed. 50 tablet 0  . XARELTO 20 MG TABS tablet TAKE 1 TABLET(20 MG) BY MOUTH DAILY WITH SUPPER 30 tablet 3   No current facility-administered medications for this visit.     No Known Allergies  Review of Systems  Constitutional: Negative for activity change and unexpected weight change.  HENT: Positive for dental problem (Needs top teeth removed). Negative for trouble swallowing and voice change.   Respiratory: Negative for  shortness of breath and wheezing.   Cardiovascular: Positive for palpitations. Negative for chest pain and leg swelling.  Gastrointestinal: Negative for abdominal pain and blood in stool.  Genitourinary: Negative for difficulty urinating and dysuria.  Neurological: Negative for syncope and weakness.  Hematological: Negative for adenopathy. Does not bruise/bleed easily.  All other systems reviewed and are negative.   BP 100/70 (BP Location: Right Arm, Patient Position: Sitting, Cuff Size: Small)   Pulse 77   Resp 16   Ht 4' 11.5" (1.511 m)   Wt 108 lb 3.2 oz (49.1 kg)   SpO2 98% Comment: ON RA  BMI 21.49 kg/m  Physical Exam  Constitutional: She is oriented to person, place, and time. She appears well-developed and well-nourished. No distress.  HENT:  Head: Normocephalic and atraumatic.  Mouth/Throat: No oropharyngeal exudate.  Eyes: Conjunctivae and EOM are normal. No scleral icterus.  Neck: No thyromegaly present.  No carotid bruits  Cardiovascular: Normal rate, regular rhythm and normal heart sounds. Exam reveals no gallop and no friction rub.  No murmur heard. 2+ femoral bilaterally, 2+ radial bilaterally  Pulmonary/Chest: Effort normal and breath sounds normal. No respiratory distress. She has no wheezes.  Abdominal: Soft. She exhibits no distension. There is no tenderness.  Musculoskeletal: She exhibits no edema.  Lymphadenopathy:    She has no cervical adenopathy.  Neurological: She is alert and oriented to person, place, and time. No cranial nerve deficit. She exhibits normal muscle tone.  Skin: Skin is warm and dry.  Vitals reviewed.   Diagnostic Tests: CT angi[facility] 10/29/2017 [facility] Impression: Calcified and ectatic thoracic aorta.  Ascending aorta measures 4.2 cm and descending aorta 2.4 cm. Coronary artery calcifications.  Cholelithiasis.  Calcified hepatic granuloma.  Echocardiogram 09/28/2017 [facility] Conclusions 1.  There is  mild concentric left ventricular hypertrophy. 2.  The aortic valve is trileaflet and appears structurally normal. 3.  There is mild aortic regurgitation. 4.  Normal cardiac chamber sizes and function.  Normal valve anatomy.  No pericardial effusion or intracardiac mass.  I personally reviewed the images from the CT and echo on the discs as well as her CT in epic from 2018.  Concur with the findings noted above  Impression: Mrs. Santiesteban is a 77 year old Asian woman with multiple cardiac risk factors including hypertension, hyperlipidemia, type 2 diabetes mellitus, and tobacco abuse.  She has systemic atherosclerotic cardiovascular disease with coronary artery disease, peripheral arterial disease, and aortic atherosclerosis.  A year ago she was found to have a 4.1 cm ascending aneurysm.  That is unchanged over the past year.  Ascending aneurysm-stable, needs continued annual follow-up.  Normal size for consideration for surgery is 5.5 cm.  Given her small stature I would strongly consider it at 5 cm.  Aortic  atherosclerosis-on Lipitor  Hypertension-blood pressure well controlled on current regimen.  Tobacco abuse- Smoking cessation instruction/counseling given:  counseled patient on the dangers of tobacco use, advised patient to stop smoking, and reviewed strategies to maximize success  Plan: Return in 1 year with CT angiogram of chest  Loreli SlotSteven C Vernadette Stutsman, MD [facility] [phone]

## 2018-04-12 DIAGNOSIS — I351 Nonrheumatic aortic (valve) insufficiency: Secondary | ICD-10-CM | POA: Diagnosis not present

## 2018-04-12 DIAGNOSIS — I4891 Unspecified atrial fibrillation: Secondary | ICD-10-CM | POA: Diagnosis not present

## 2018-04-12 DIAGNOSIS — I517 Cardiomegaly: Secondary | ICD-10-CM | POA: Diagnosis not present

## 2018-04-12 DIAGNOSIS — I340 Nonrheumatic mitral (valve) insufficiency: Secondary | ICD-10-CM | POA: Diagnosis not present

## 2018-04-17 DIAGNOSIS — Z79899 Other long term (current) drug therapy: Secondary | ICD-10-CM | POA: Diagnosis not present

## 2018-04-17 DIAGNOSIS — E7800 Pure hypercholesterolemia, unspecified: Secondary | ICD-10-CM | POA: Diagnosis not present

## 2018-04-17 DIAGNOSIS — I10 Essential (primary) hypertension: Secondary | ICD-10-CM | POA: Diagnosis not present

## 2018-04-17 DIAGNOSIS — E119 Type 2 diabetes mellitus without complications: Secondary | ICD-10-CM | POA: Diagnosis not present

## 2018-04-17 DIAGNOSIS — I712 Thoracic aortic aneurysm, without rupture: Secondary | ICD-10-CM | POA: Diagnosis not present

## 2018-04-17 DIAGNOSIS — E559 Vitamin D deficiency, unspecified: Secondary | ICD-10-CM | POA: Diagnosis not present

## 2018-04-17 DIAGNOSIS — M109 Gout, unspecified: Secondary | ICD-10-CM | POA: Diagnosis not present

## 2018-05-31 ENCOUNTER — Ambulatory Visit: Payer: Medicare HMO | Admitting: Family

## 2018-05-31 ENCOUNTER — Inpatient Hospital Stay (HOSPITAL_COMMUNITY): Admission: RE | Admit: 2018-05-31 | Payer: Medicare HMO | Source: Ambulatory Visit

## 2018-06-10 ENCOUNTER — Encounter: Payer: Self-pay | Admitting: Family

## 2018-06-10 ENCOUNTER — Ambulatory Visit (INDEPENDENT_AMBULATORY_CARE_PROVIDER_SITE_OTHER): Payer: Medicare HMO | Admitting: Family

## 2018-06-10 ENCOUNTER — Ambulatory Visit (HOSPITAL_COMMUNITY)
Admission: RE | Admit: 2018-06-10 | Discharge: 2018-06-10 | Disposition: A | Discharge status: Home / Self Care | Payer: Medicare HMO | Source: Ambulatory Visit | Attending: Family | Admitting: Family

## 2018-06-10 VITALS — BP 147/75 | HR 59 | Temp 97.1°F | Resp 16 | Ht 60.0 in | Wt 111.0 lb

## 2018-06-10 DIAGNOSIS — F17200 Nicotine dependence, unspecified, uncomplicated: Secondary | ICD-10-CM

## 2018-06-10 DIAGNOSIS — I712 Thoracic aortic aneurysm, without rupture, unspecified: Secondary | ICD-10-CM

## 2018-06-10 DIAGNOSIS — K551 Chronic vascular disorders of intestine: Secondary | ICD-10-CM

## 2018-06-10 DIAGNOSIS — Z7722 Contact with and (suspected) exposure to environmental tobacco smoke (acute) (chronic): Secondary | ICD-10-CM | POA: Diagnosis not present

## 2018-06-10 DIAGNOSIS — Z9862 Peripheral vascular angioplasty status: Secondary | ICD-10-CM

## 2018-06-10 DIAGNOSIS — R69 Illness, unspecified: Secondary | ICD-10-CM | POA: Diagnosis not present

## 2018-06-10 NOTE — Patient Instructions (Addendum)
Before your next abdominal ultrasound:  Avoid gas forming foods and beverages the day before the test.   Take two Extra-Strength Gas-X capsules at bedtime the night before the test. Take another two Extra-Strength Gas-X capsules in the middle of the night if you get up to the restroom, if not, first thing in the morning with water.  Do not chew gum.    Secondhand Smoke What is secondhand smoke? Secondhand smoke is smoke that comes from burning tobacco. It could be the smoke from a cigarette, a pipe, or a cigar. Even if you are not the one smoking, secondhand smoke exposes you to the dangers of smoking. This is called involuntary, or passive, smoking. There are two types of secondhand smoke:  Sidestream smoke is the smoke that comes off the lighted end of a cigarette, pipe, or cigar. ? This type of smoke has the highest amount of cancer-causing agents (carcinogens). ? The particles in sidestream smoke are smaller. They get into your lungs more easily.  Mainstream smoke is the smoke that is exhaled by a person who is smoking. ? This type of smoke is also dangerous to your health.  How can secondhand smoke affect my health? Studies show that there is no safe level of secondhand smoke. This smoke contains thousands of chemicals. At least 69 of them are known to cause cancer. Secondhand smoke can also cause many other health problems. It has been linked to:  Lung cancer.  Cancer of the voice box (larynx) or throat.  Cancer of the sinuses.  Brain cancer.  Bladder cancer.  Stomach cancer.  Breast cancer.  White blood cell cancers (lymphoma and leukemia).  Brain and liver tumors in children.  Heart disease and stroke in adults.  Pregnancy loss (miscarriage).  Diseases in children, such as: ? Asthma. ? Lung infections. ? Ear infections. ? Sudden infant death syndrome (SIDS). ? Slow growth.  Where can I be at risk for exposure to secondhand smoke?  For adults, the workplace  is the main source of exposure to secondhand smoke. ? Your workplace should have a policy separating smoking areas from nonsmoking areas. ? Smoking areas should have a system for ventilating and cleaning the air.  For children, the home may be the most dangerous place for exposure to secondhand smoke. ? Children who live in apartment buildings may be at risk from smoke drifting from hallways or other people's homes.  For everyone, many public places are possible sources of exposure to secondhand smoke. ? These places include restaurants, shopping centers, and parks. How can I reduce my risk for exposure to secondhand smoke? The most important thing you can do is not smoke. Discourage family members from smoking. Other ways to reduce exposure for you and your family include the following:  Keep your home smoke free.  Make sure your child care providers do not smoke.  Warn your child about the dangers of smoking and secondhand smoke.  Do not allow smoking in your car. When someone smokes in a car, all the damaging chemicals from the smoke are confined in a small area.  Avoid public places where smoking is allowed.  This information is not intended to replace advice given to you by your health care provider. Make sure you discuss any questions you have with your health care provider. Document Released: 09/14/2004 Document Revised: 07/04/2016 Document Reviewed: 11/21/2013 Elsevier Interactive Patient Education  2017 Elsevier Inc.    Cc b??c b? ht thu?c Steps to Quit Smoking Ht thu?c  c th? c h?i cho s?c kh?e c?a qu v? v c th? ?nh h??ng ??n h?u h?t cc c? quan trong c? th? qu v?. Ht thu?c s? lm qu v? v nh?ng ng??i xung quanh qu v? c nguy c? pht sinh nhi?u b?nh m?n tnh nghim tr?ng. Kh b? ht thu?c, nh?ng ? l m?t trong nh?ng ?i?u t?t nh?t qu v? c th? lm v s?c kh?e c?a qu v?. B? ht thu?c khng bao gi? qu mu?n. Nh?ng l?i ch c?a vi?c b? ht thu?c l g? Khi b? ht  thu?c, qu v? s? gi?m nguy c? pht sinh cc b?nh v tnh tr?ng nghim tr?ng, ch?ng h?n nh?:  Ung th? ph?i ho?c b?nh ph?i, ch?ng h?n nh? COPD.  B?nh tim.  ??t qu?Marland Kitchen  Nh?i mu c? tim.  V sinh.  Long x??ng v gy x??ng.  Ngoi ra, cc tri?u ch?ng nh? ho, th? kh kh, kh th? c th? ?? h?n khi qu v? b? ht thu?c. Qu v? c?ng c th? th?y t b? ?m h?n v c? th? qu v? c s?c ?? khng m?nh h?n v?i c?m l?nh v nhi?m trng. N?u qu v? mang thai, b? ht thu?c c th? gip gi?m nguy c? sinh con nh? cn. Ti chu?n b? s?n sng cho vi?c b? ht thu?c nh? th? no? N?u qu v? quy?t ??nh b? ht thu?c, hy l?p m?t k? ho?ch ?? ch?c ch?n qu v? s? thnh cng. Tr??c khi qu v? b? ht thu?c:  Hy ch?n m?t ngy ?? b? thu?c l. ??nh ra m?t ngy trong hai tu?n t?i ?? qu v? c th?i gian chu?n b?.  Ghi l?i nh?ng l do m qu v? b? ht thu?c. Gi? danh sch ny ? nh?ng n?i m qu v? nhn th?y th??ng xuyn, ch?ng h?n nh? ? g??ng c?a phng t?m ho?c trong xe h?i ho?c trong v.  Xc ??nh nh?ng ng??i, ??a ?i?m, ?? v?t v ho?t ??ng lm cho qu v? mu?n ht thu?c (tc nhn kh?i pht) v trnh nh?ng tc nhn ?. Hy ch?c ch?n th?c hi?n nh?ng hnh ??ng sau: ? V?t b? t?t c? thu?c l ? nh, n?i lm vi?c v trn xe h?i. ? V?t b? cc ?? dng ?? ht thu?c l, ch?ng h?n nh? g?t tn v b?t l?a. ? V? sinh xe h?i v ch?c ch?n ph?i ?? b? g?t tn thu?c l. ? V? sinh nh c?a, k? c? rm c?a v th?m.  Ni v?i gia ?nh, b?n b v ??ng nghi?p r?ng qu v? ?ang b? thu?c l. Vi?c h? tr? c?a ng??i thn c th? gip qu v? b? ht thu?c d? dng h?n.  Hy ni chuy?n v?i chuyn gia ch?m [place] s?c kh?e v? nh?ng ph??ng n b? ht thu?c.  Tm hi?u nh?ng ph??ng n ?i?u tr? ???c b?o hi?m y t? c?a qu v? bao tr?Marland Kitchen  Ti c th? s? d?ng nh?ng chi?n l??c g ?? b? ht thu?c? Hy ni chuy?n v?i chuyn gia ch?m [place] s?c kh?e v? nh?ng chi?n l??c b? ht thu?c khc nhau. M?t s? chi?n l??c bao g?m:  B? ht thu?c hon ton thay v gi?m d?n l??ng thu?c ht trong  m?t th?i gian. Cc nghin c?u cho th?y vi?c b? ht thu?c hon ton thnh cng h?n b? ht thu?c d?n d?n.  G?p xin t? v?n tr?c ti?p ?? gip xy d?ng k? n?ng gi?i quy?t v?n ??[place] Heads v? c kh? n?ng thnh cng h?n trong vi?c b? ht thu?c n?u qu v? tham gia  m?t vi bu?i t? v?n. Ngay c? nh?ng bu?i t? v?n ko di 10 pht c?ng c th? c hi?u qu?Marland Kitchen  Tm nh?ng ngu?n v h? th?ng h? tr? c th? gip qu v? b? ht thu?c v duy tr tnh tr?ng khng khi thu?c sau khi b? ht thu?c. Nh?ng ngu?n ny h?u ch nh?t khi qu v? s? d?ng th??ng xuyn. Nh?ng ngu?n ? c th? bao g?m: ? Tr chuy?n tr?c tuy?n v?i chuyn gia t? v?n. ? ???ng ?i?n tho?i t? v?n cai thu?c. ? Ti li?u in t? tham kh?o. ? Cc nhm h? tr? ho?c t? v?n theo nhm. ? Cc ch??ng trnh g?i tin nh?n. ? Cc ?ng d?ng ?i?n tho?i di ??ng.  Dng thu?c ?? gip qu v? b? ht thu?c. (N?u qu v? mang thai ho?c ?ang cho con b, hy ni chuy?n tr??c v?i chuyn gia ch?m [place] s?c kh?e). M?t s? thu?c c ch?a nicotine v s? khc th khng. C? hai lo?i thu?c ??u gip b?t thm thu?c, nh?ng nh?ng thu?c c ch?a nicotine gip gi?m nh? cc tri?u ch?ng cai thu?c. Chuyn gia ch?m [place] s?c kh?e c?a qu v? c th? khuy?n ngh?: ? Mi?ng dn nicotine, k?o nhai ho?c thu?c vin. ? ?ng ht ho?c bnh x?t nicotine. ? Thu?c khng c ch?a nicotine dng qua ???ng u?ng.  Ni chuy?n v?i chuyn gia ch?m [place] s?c kh?e v? vi?c k?t h?p cc chi?n l??c, ch?ng h?n nh? dng thu?c trong khi qu v? c?ng ?ang ???c t? v?n tr?c ti?p. S? d?ng hai chi?n l??c ny cng m?t lc lm cho qu v? c kh? n?ng thnh cng h?n trong vi?c b? ht thu?c so v?i khi qu v? ch? s? d?ng ring m?t chi?n l??c. N?u qu v? mang thai ho?c cho con b, hy ni chuy?n v?i chuyn gia ch?m [place] s?c kh?e v? vi?c tm cc ch??ng trnh t? v?n ho?c h? tr? khc ?? b? ht thu?c. Khng dng thu?c ?? b? ht thu?c tr? khi chuyn gia ch?m [place] s?c kh?e b?o qu v? lm nh? v?y. Ti c th? lm nh?ng vi?c g ?? b? ht thu?c d? h?n? B? ht thu?c lc ??u c v?  kh kh?n, nh?ng c nhi?u vi?c qu v? c th? lm ?? cho vi?c ny d? dng h?n. Th?c hi?n nh?ng hnh ??ng quan tr?ng sau:  Nh? gia ?nh v b?n b v yu c?u h? h? tr? v ??ng vin trong th?i gian ny. G?i ?i?n ??n ???ng dy t? v?n cai nghi?n, nh? cc nhm h? tr? ho?c lm vi?c v?i m?t chuyn gia t? v?n ?? ???c h? tr?.  ?? ngh? nh?ng ng??i ht thu?c trnh ht thu?c c?nh qu v?.  Trnh nh?ng n?i khi?n qu v? mu?n ht thu?c, ch?ng h?n nh? qun r??u, ti?c lin hoan ho?c khu v?c gi?i lao c ht thu?c ? n?i lm vi?c.  Dnh th?i gian ? c?nh nh?ng ng??i khng ht thu?c.  Gi?m c?ng th?ng trong cu?c s?ng, v c?ng th?ng l tc nhn kh?i pht ht thu?c ??i v?i m?t s? ng??i. ?? gi?m c?ng th?ng, hy c? g?ng: ? T?p th? d?c th??ng xuyn. ? T?p cc bi t?p th? su. ? Yoga. ? Thi?n. ? Ch?p qut c? th?. Vi?c ny ?i h?i qu v? ph?i nh?m m?t, ch?p qut c? th? t? ??u ??n chn v pht hi?n nh?ng b? ph?n ??c bi?t c?ng th?ng c?a c? th? qu v?. Ch? tm th? l?ng cc c? ? nh?ng khu v?c ?.  T?i v? ho?c mua cc ch??ng trnh ?  ng d?ng (?ng d?ng) dng cho ?i?n tho?i di ??ng ho?c my tnh b?ng c th? gip qu v? gi? v?ng k? ho?ch cai thu?c thng qua cc l?i nh?c, l?i khuyn v ??ng vin. C nhi?u ?ng d?ng mi?n ph, ch?ng h?n QuitGuide c?a CDC (Trung tm ki?m sot v phng ch?ng b?nh t?t). Qu v? c th? tm s? tr? gip khc ?? b? ht thu?c (ng?ng ht thu?c) thng qua [email or web address] v cc website khc.  Ti s? c?m th?y nh? th? no khi b? ht thu?c? Trong vng 24 gi? ??u tin t? khi b? ht thu?c, qu v? c th? b?t ??u c?m th?y m?t s? tri?u ch?ng cai thu?c. Nh?ng tri?u ch?ng ny th??ng d? nh?n ra nh?t trong 2-3 ngy sau khi b? thu?c, nh?ng th??ng khng ko di qu 2-3 tu?n. Nh?ng thay ??i ho?c tri?u ch?ng m qu v? c th? c?m nh?n bao g?m:  Thay ??i tm tr?ng.  B?n ch?n, lo l?ng ho?c d? cu k?nh.  Kh t?p trung.  Chng m?t.  Ngoi nicotine cn thm cc lo?i th?c ?n ng?t.  T?ng cn m?t cht.  To bn.  Bu?n nn.  Ho  ho?c ?au h?ng.  Thay ??i c? ch? tc d?ng c?a cc lo?i thu?c trong c? th? qu v?.  Tr?ng thi tr?m c?m.  Kh ng? (m?t ng?).  Sau 2-3 tu?n ??u tin b? ht thu?c, qu v? c th? b?t ??u nh?n th?y cc k?t qu? tch c?c h?n, ch?ng h?n nh?:  C?i thi?n kh?u gic v v? gic.  Gi?m ho v gi?m ?au h?ng.  Nh?p tim ch?m h?n.  Huy?t p th?p h?n.  Da sng h?n.  Th? d? dng h?n.  t ngy ?m h?n.  B? ht thu?c l r?t kh ??i v?i h?u h?t m?i ng??i. Khng nh?t ch n?u qu v? khng thnh cng trong l?n ??u. M?t s? ng??i c?n nhi?u l?n c? g?ng b? ht thu?c tr??c khi h? c ???c thnh cng lu di. H?t s?c c? g?ng gi? v?ng k? ho?ch b? ht thu?c v ni chuy?n v?i chuyn gia ch?m [place] s?c kh?e n?u qu v? c b?t k? th?c m?c ho?c lo ng?i no. Thng tin ny khng nh?m m?c ?ch thay th? cho l?i khuyn m chuyn gia ch?m [place] s?c kh?e ni v?i qu v?. Hy b?o ??m qu v? ph?i th?o lu?n b?t k? v?n ?? g m qu v? c v?i chuyn gia ch?m [place] s?c kh?e c?a qu v?. Document Released: 11/22/2006 Document Revised: 11/23/2016 Document Reviewed: 12/22/2014 Elsevier Interactive Patient Education  2018 Elsevier Inc.     Chronic Mesenteric Ischemia Mesenteric ischemia is poor blood flow (circulation) in the vessels that supply blood to the stomach, intestines, and liver (mesenteric organs). Chronic mesenteric ischemia, also called mesenteric angina or intestinal angina, is a long-term (chronic) condition. It happens when an artery or vein that provides blood to the mesenteric organs gradually becomes blocked or narrow, restricting the blood supply to the organs. When the blood supply is severely restricted, the mesenteric organs cannot work properly. What are the causes? This condition is commonly caused by fatty deposits that build up in an artery (plaque), which can narrow the artery and restrict blood flow. Other causes include:  Weakened areas in blood vessel walls (aneurysms).  Conditions that cause twisting or  inflammation of blood vessels, such as fibromuscular dysplasia or arteritis.  A disorder in which blood clots form in the veins (venous thrombosis).  Scarring and thickening (fibrosis) of blood vessels caused by radiation  therapy.  A tear in the aorta, the body's main artery (aortic dissection).  Blood vessel problems after illegal drug use, such as use of cocaine.  Tumors in the nervous system (neurofibromatosis).  Certain autoimmune diseases, such as lupus.  What increases the risk? The following factors may make you more likely to develop this condition:  Being female.  Being over age 72, especially if you have a history of heart problems.  Smoking.  Congestive heart failure.  Irregular heartbeat (arrhythmia).  Having a history of heart attack or stroke.  Diabetes.  High cholesterol.  High blood pressure (hypertension).  Being overweight or obese.  Kidney disease (renal disease) requiring dialysis.  What are the signs or symptoms? Symptoms of this condition include:  Abdomen (abdominal) pain or cramps that develop 15-60 minutes after a meal. This pain may last for 1-3 hours. Some people may develop a fear of eating because of this symptom.  Weight loss.  Diarrhea.  Bloody stool.  Nausea.  Vomiting.  Bloating.  Abdominal pain after stress or with exercise.  How is this diagnosed? This condition is diagnosed based on:  Your medical history.  A physical exam.  Tests, such as: ? Ultrasound. ? CT scan. ? Blood tests. ? Urine tests. ? An imaging test that involves injecting a dye into your arteries to show blood flow through blood vessels (angiogram). This can help to show if there are any blockages in the vessels that lead to the intestines. ? Passing a small probe through the mouth and into the stomach to measure the output of carbon dioxide (gastric tonometry). This can help to indicate whether there is decreased blood flow to the stomach and  intestines.  How is this treated? This condition may be treated with:  Dietary changes such as eating smaller, low-fat, meals more frequently.  Lifestyle changes to treat underlying conditions that contribute to the disease, such as high cholesterol and high blood pressure.  Medicines to reduce blood clotting and increase blood flow.  Surgery to remove the blockage, repair arteries or veins, and restore blood flow. This may involve: ? Angioplasty. This is surgery to widen the affected artery, reduce the blockage, and sometimes insert a small, mesh tube (stent). ? Bypass surgery. This may be done to go around (bypass) the blockage and reconnect healthy arteries or veins. ? Placing a stent in the affected area. This may be done to help keep blocked arteries open.  Follow these instructions at home: Eating and drinking  Eat a heart-healthy diet. This includes fresh fruits and vegetables, whole grains, and lean proteins like chicken, fish, eggs, and beans.  Avoid foods that contain a lot of: ? Salt (sodium). ? Sugar. ? Saturated fat (such as red meat). ? Trans fat (such as fried foods).  Stay hydrated. Drink enough fluid to keep your urine clear or pale yellow. Lifestyle  Stay active and get regular exercise as told by your health care provider. Aim for 150 minutes of moderate activity or 75 minutes of vigorous activity a week. Ask your health care provider what activities and forms of exercise are safe for you.  Maintain a healthy weight.  Work with your health care provider to manage your cholesterol.  Manage any other health problems you have, such as high blood pressure, diabetes, or heart rhythm problems.  Do not use any products that contain nicotine or tobacco, such as cigarettes and e-cigarettes. If you need help quitting, ask your health care provider. General instructions  Take over-the-counter  and prescription medicines only as told by your health care  provider.  Keep all follow-up visits as told by your health care provider. This is important. Contact a health care provider if:  Your symptoms do not improve or they return after treatment.  You have a fever. Get help right away if:  You have severe abdominal pain.  You have severe chest pain.  You have shortness of breath.  You feel weak or dizzy.  You have palpitations.  You have numbness or weakness in your face, arm, or leg.  You are confused.  You have trouble speaking or people have trouble understanding what you are saying.  You are constipated.  You have trouble urinating.  You have blood in your stool.  You have severe nausea, vomiting, or persistent diarrhea. Summary  Mesenteric ischemia is poor circulation in the vessels that supply blood to the the stomach, intestines, and liver (mesenteric organs).  This condition happens when an artery or vein that provides blood to the mesenteric organs gradually becomes blocked or narrow, restricting the blood supply to the organs.  This condition is commonly caused by fatty deposits that build up in an artery (plaque), which can narrow the artery and restrict blood flow.  You are more likely to develop this condition if you are over age 28 and have a history of heart problems, high blood pressure, diabetes, or high cholesterol.  This condition is usually treated with medicines, dietary and lifestyle changes, and surgery to remove the blockage, repair arteries or veins, and restore blood flow. This information is not intended to replace advice given to you by your health care provider. Make sure you discuss any questions you have with your health care provider. Document Released: 03/27/2011 Document Revised: 07/22/2016 Document Reviewed: 07/22/2016 Elsevier Interactive Patient Education  2017 Elsevier Inc.       Thoracic Aortic Aneurysm An aneurysm is a bulge in an artery. It happens when blood pushes up against  a weakened or damaged artery wall. A thoracic aortic aneurysm is an aneurysm that occurs in the first part of the aorta, between the heart and the diaphragm. The aorta is the main artery of the body. It supplies blood from the heart to the rest of the body. Some aneurysms may not cause symptoms or problems. However, the major concern with a thoracic aortic aneurysm is that it can enlarge and burst (rupture), or blood can flow between the layers of the wall of the aorta through a tear (aorticdissection). Both of these conditions can cause bleeding inside the body and can be life-threatening if they are not diagnosed and treated right away. What are the causes? The exact cause of this condition is not known. What increases the risk? The following factors may make you more likely to develop this condition:  Being age 74 or older.  Having a hardening of the arteries caused by the buildup of fat and other substances in the lining of a blood vessel (arteriosclerosis).  Having inflammation of the walls of an artery (arteritis).  Having a genetic disease that weakens the body's connective tissue, such as Marfan syndrome.  Having an injury or trauma to the aorta.  Having an infection that is caused by bacteria, such as syphilis or staphylococcus, in the wall of the aorta (infectious aortitis).  Having high blood pressure (hypertension).  Being female.  Being white (Caucasian).  Having high cholesterol.  Having a family history of aneurysms.  Using tobacco.  Having chronic obstructive pulmonary disease (  COPD).  What are the signs or symptoms? Symptoms of this condition vary depending on the size and rate of growth of the aneurysm. Most grow slowly and do not cause any symptoms. When symptoms do occur, they may include:  Pain in the chest, back, sides, or abdomen. The pain may vary in intensity. A sudden onset of severe pain may indicate that the aneurysm has  ruptured.  Hoarseness.  Cough.  Shortness of breath.  Swallowing problems.  Swelling in the face, arms, or legs.  Fever.  Unexplained weight loss.  How is this diagnosed? This condition may be diagnosed with:  An ultrasound.  X-rays.  A CT scan.  An MRI.  Tests to check the arteries for damage or blockages (angiogram).  Most unruptured thoracic aortic aneurysms cause no symptoms, so they are often found during exams for other conditions. How is this treated? Treatment for this condition depends on:  The size of the aneurysm.  How fast the aneurysm is growing.  Your age.  Risk factors for rupture.  Aneurysms that are smaller than 2.2 inches (5.5 cm) may be managed by using medicines to control blood pressure, manage pain, or fight infection. You may need regular monitoring to see if the aneurysm is getting bigger. Your health care provider may recommend that you have an ultrasound every year or every 6 months. How often you need to have an ultrasound depends on the size of the aneurysm, how fast it is growing, and whether you have a family history of aneurysms. Surgical repair may be needed if your aneurysm is larger than 2.2 inches or if it is growing quickly. Follow these instructions at home: Eating and drinking  Eat a healthy diet. Your health care provider may recommend that you: ? Lower your salt (sodium) intake. In some people, too much salt can raise blood pressure and increase the risk of thoracic aortic aneurysm. ? Avoid foods that are high in saturated fat and cholesterol, such as red meat and dairy. ? Eat a diet that is low in sugar. ? Increase your fiber intake by including whole grains, vegetables, and fruits in your diet. Eating these foods may help to lower blood pressure.  Limit or avoid alcohol as recommended by your health care provider. Lifestyle  Follow instructions from your health care provider about healthy lifestyle habits. Your health  care provider may recommend that you: ? Do not use any products that contain nicotine or tobacco, such as cigarettes and e-cigarettes. If you need help quitting, ask your health care provider. ? Keep your blood pressure within normal limits. The target limit for most people is below 120/80. Check your blood pressure regularly. If it is high, ask your health care provider about ways that you can control it. ? Keep your blood sugar (glucose) level and cholesterol levels within normal limits. Target limits for most people are:  Blood glucose level: Less than 100 mg/dL.  Total cholesterol level: Less than 200 mg/dL. ? Maintain a healthy weight. Activity  Stay physically active and exercise regularly. Talk with your health care provider about how often you should exercise and ask which types of exercise are safe for you.  Avoid heavy lifting and activities that take a lot of effort (are strenuous). Ask your health care provider what activities are safe for you. General instructions  Keep all follow-up visits as told by your health care provider. This is important. ? Talk with your health care provider about regular screenings to see if the  aneurysm is getting bigger.  Take over-the-counter and prescription medicines only as told by your health care provider. Contact a health care provider if:  You have discomfort in your upper back, neck, or abdomen.  You have trouble swallowing.  You have a cough or hoarseness.  You have a family history of aneurysms.  You have unexplained weight loss. Get help right away if:  You have sudden, severe pain in your upper back and abdomen. This pain may move into your chest and arms.  You have shortness of breath.  You have a fever. This information is not intended to replace advice given to you by your health care provider. Make sure you discuss any questions you have with your health care provider. Document Released: 08/07/2005 Document Revised:  05/19/2016 Document Reviewed: 05/19/2016 Elsevier Interactive Patient Education  [employer].

## 2018-06-10 NOTE — Progress Notes (Addendum)
CC: Follow up Mesenteric Ischemia  History of Present Illness  Linda [name] is a 77 y.o. (05/26/1941) female who was found to have splenic and renal infarcts with known atrial fibrillation not on anticoagulation. At that time we obtained CT angiogram of her chest which did not demonstrate any source of embolism but she did have known calcification of much ofher aorta, also at the ostia of the celiac and SMA.   She follows up today with mesenteric duplex.  History obtained through her nephew on initial visit demonstrated that she was eating well without much abdominal pain. She does have some fluttering in her chest which is a known problem.  She is otherwise without issue at her normal level of activity taking Xarelto as prescribed.   Dr. Randie Heinz last evaluated pt on 11-24-16. At that time mesenteric duplex demonstrated greater than 70% celiac stenosis, less than 70% SMA stenosis and flow in her IMA. Dr. Randie Heinz  also reviewed her CT angiogram of her chest and abdomen from the hospital which did not demonstrate any source of embolic disease but she did have significant calcification of her aorta. She also had an enlarged aortic root to 4 cm. She is maintained on Xarelto and has calcification of her aorta by duplex that day demonstrating stenosis of her iliac artery with less than 70% in her SMA and a patent IMA. She was not having abdominal symptoms and was eating without issues. She was to follow-up in 1 year with repeat mesenteric duplex at that time. She has a 4 cm ascending aorta for which she should be seen by thoracic surgery and interval follow-up with CT scan.  Dr. Dorris Fetch is monitoring pat's ascending thoracic aortic aneurysm; he evaluated pt on 04-02-18.   Pt is with a [place] interpreter.  She denies post prandial abdominal pain, denies food fear. She states her appetite has improved since she started taking a vitamin. Noted that she is also taking mirtazapine which improves the  appetite.   She states her knee has been bothering her and is wearing a brace, she will speak with her PCP re this.  2011 and 2013 strokes, no residual neurological deficit.  Diabetic: she does not know, last A1C result on file was 5.9 on 03-07-17, she takes metformin  Tobacco use: 4 cigarettes/day, started smoking at age 22 years, indicates she would like to quit, but it is difficult as the person she lives with smokes   Past Medical History:  Diagnosis Date  . Atrial fibrillation (HCC)    dx in setting of stroke 4/12;  echo with EF 60-65%, mild LVH, mild AI, grade 1 diast dysfxn  . Coronary artery disease   . Diabetes mellitus without complication (HCC)   . History of CVA (cerebrovascular accident)    New diagnosis December 08, 2010  . Hx of cardiovascular stress test    a. Myoview 8/12:  EF 69%, no ischemia.   . Hyperlipidemia   . Hypertension   . Impaired glucose tolerance 05/08/2011  . Stroke [facility]) 2012   no deficits  . Tobacco abuse    Ongoing (3-6 cigarettes per day, 25 pack year history)    Social History Social History   Tobacco Use  . Smoking status: Light Tobacco Smoker    Packs/day: 0.25    Years: 25.00    Pack years: 6.25    Types: Cigarettes  . Smokeless tobacco: Never Used  . Tobacco comment: Down to 1 cigarette per day.   Substance  Use Topics  . Alcohol use: No  . Drug use: No    Family History Family History  Problem Relation Age of Onset  . Coronary artery disease Neg Hx     Surgical History Past Surgical History:  Procedure Laterality Date  . COLONOSCOPY N/A 02/01/2016   Procedure: COLONOSCOPY;  Surgeon: Ruffin Frederick, MD;  Location: [facility] ENDOSCOPY;  Service: Gastroenterology;  Laterality: N/A;    No Known Allergies  Current Outpatient Medications  Medication Sig Dispense Refill  . atorvastatin (LIPITOR) 20 MG tablet TAKE 1 TABLET BY MOUTH EVERY DAY 30 tablet 5  . colchicine 0.6 MG tablet Take 1 tablet (0.6 mg total) by mouth daily.  30 tablet 0  . diltiazem (CARDIZEM CD) 240 MG 24 hr capsule Take by mouth.    . diltiazem (TIAZAC) 360 MG 24 hr capsule Take 1 capsule (360 mg total) by mouth daily. Please call and schedule an appointment in the AFIB clinic 30 capsule 4  . lactulose (CHRONULAC) 10 GM/15ML solution Take 15 mLs (10 g total) by mouth 2 (two) times daily as needed for mild constipation. 946 mL 1  . lisinopril (PRINIVIL,ZESTRIL) 5 MG tablet TAKE 1 TABLET BY MOUTH EVERY DAY 30 tablet 4  . metFORMIN (GLUCOPHAGE) 500 MG tablet Take 500 mg by mouth 2 (two) times daily with a meal.    . mirtazapine (REMERON) 15 MG tablet     . pantoprazole (PROTONIX) 40 MG tablet TAKE 1 TABLET(40 MG) BY MOUTH DAILY 30 tablet 3  . traMADol (ULTRAM) 50 MG tablet Take 1 tablet (50 mg total) by mouth every 12 (twelve) hours as needed. 50 tablet 0  . XARELTO 20 MG TABS tablet TAKE 1 TABLET(20 MG) BY MOUTH DAILY WITH SUPPER 30 tablet 3   No current facility-administered medications for this visit.     ROS: see HPI for pertinent positives and negatives    Physical Examination  Vitals:   06/10/18 0853  BP: (!) 147/75  Pulse: (!) 59  Resp: 16  Temp: (!) 97.1 F (36.2 C)  TempSrc: Oral  SpO2: 100%  Weight: 111 lb (50.3 kg)  Height: 5' (1.524 m)   Body mass index is 21.68 kg/m.  General: A&O x 3, WDWN, petite female HEENT: unremarkable, few missing teeth  Pulmonary: Sym exp, good air movt, CTAB, no rales, rhonchi, or wheezing. Cardiac: RRR, Nl S1, S2, no detected Murmur.  Vascular: Vessel Right Left  Radial 2+Palpable 2+Palpable  Carotid  without bruit  without bruit  Aorta Not palpable N/A  Femoral 2+Palpable 2+Palpable  Popliteal 2+ palpable Not palpable  PT 1+ Palpable Not Palpable  DP 1+ Palpable faintly Palpable   Gastrointestinal: soft, NTND, -G/R, - HSM, - palpable masses, - CVAT. Musculoskeletal: M/S 5/5 throughout, Extremities without ischemic changes. Skin: No rash, no cellulitis, no ulcers noted.    Neurologic: Pain and light touch intact in extremities, Motor exam as listed above. CN 2-12 intact.  Psychiatric: Normal thought content, mood appropriate to clinical situation.     DATA  Mesenteric Duplex (Date: 06/10/2018):   Ao: 1.98 cm measured proximally   Celiac artery: 640 cm/s (was 247 cm/s on 11-30-17)  SMA: 205 cm/s at origin, 305 cm/s proximal (was 344 cm/s on 11-30-17) Visualization limited by air/bowel gas Unable to visualize splenic artery.   Medical Decision Making  Linda [name] is a 77 y.o. female who presents with: chronic mesenteric ischemia. She has not had any mesenteric artery intervention.   No post prandial abdominal pain,  no food fear, 4 pounds weight gain in six months. Appetite has improved since she started taking a vitamin, also taking mirtazapine.   She takes a statin and Xarelto.   I discussed with Dr. Myra Gianotti pr remains asymptomatic, has gained weight, 640 cm/s PSV at celiac artery origin; see Plan.   I emphasized to pt through her interpreter that she needs to continue to follow up with Dr. Dorris Fetch to monitor her thoracic aortic aneurysm.   Follow up in 6 months with mesenteric duplex, see me, let us know if she develops food fear.  The patient was counseled re smoking cessation and given several free resources re smoking cessation.     I discussed in depth with the patient the nature of atherosclerosis, and emphasized the importance of maximal medical management including strict control of blood pressure, blood glucose, and lipid levels, obtaining regular exercise, and cessation of smoking.    The patient is aware that without maximal medical management the underlying atherosclerotic disease process will progress, limiting the benefit of any interventions. . The patient is currently on a statin.  . The patient is currently on an anticoagulant as she has a hx of atrial fib .    Thank you for allowing [name] to participate in this patient's  care.  Charisse March, RN, MSN, FNP-C [facility] Office: [phone]  Clinic MD: Myra Gianotti  06/10/2018, 9:19 AM

## 2018-06-17 DIAGNOSIS — M79672 Pain in left foot: Secondary | ICD-10-CM | POA: Diagnosis not present

## 2018-06-17 DIAGNOSIS — E119 Type 2 diabetes mellitus without complications: Secondary | ICD-10-CM | POA: Diagnosis not present

## 2018-06-17 DIAGNOSIS — L03116 Cellulitis of left lower limb: Secondary | ICD-10-CM | POA: Diagnosis not present

## 2018-06-17 DIAGNOSIS — L02612 Cutaneous abscess of left foot: Secondary | ICD-10-CM | POA: Diagnosis not present

## 2018-06-24 DIAGNOSIS — L02612 Cutaneous abscess of left foot: Secondary | ICD-10-CM | POA: Diagnosis not present

## 2018-06-24 DIAGNOSIS — M79672 Pain in left foot: Secondary | ICD-10-CM | POA: Diagnosis not present

## 2018-06-24 DIAGNOSIS — L03116 Cellulitis of left lower limb: Secondary | ICD-10-CM | POA: Diagnosis not present

## 2018-06-24 DIAGNOSIS — E119 Type 2 diabetes mellitus without complications: Secondary | ICD-10-CM | POA: Diagnosis not present

## 2018-07-24 DIAGNOSIS — I10 Essential (primary) hypertension: Secondary | ICD-10-CM | POA: Diagnosis not present

## 2018-07-24 DIAGNOSIS — I712 Thoracic aortic aneurysm, without rupture: Secondary | ICD-10-CM | POA: Diagnosis not present

## 2018-07-24 DIAGNOSIS — E119 Type 2 diabetes mellitus without complications: Secondary | ICD-10-CM | POA: Diagnosis not present

## 2018-07-24 DIAGNOSIS — E7800 Pure hypercholesterolemia, unspecified: Secondary | ICD-10-CM | POA: Diagnosis not present

## 2018-07-24 DIAGNOSIS — M109 Gout, unspecified: Secondary | ICD-10-CM | POA: Diagnosis not present

## 2018-07-24 DIAGNOSIS — Z79899 Other long term (current) drug therapy: Secondary | ICD-10-CM | POA: Diagnosis not present

## 2018-07-24 DIAGNOSIS — E559 Vitamin D deficiency, unspecified: Secondary | ICD-10-CM | POA: Diagnosis not present

## 2018-08-30 DIAGNOSIS — B353 Tinea pedis: Secondary | ICD-10-CM | POA: Diagnosis not present

## 2018-08-30 DIAGNOSIS — E119 Type 2 diabetes mellitus without complications: Secondary | ICD-10-CM | POA: Diagnosis not present

## 2018-08-30 DIAGNOSIS — M79672 Pain in left foot: Secondary | ICD-10-CM | POA: Diagnosis not present

## 2018-08-30 DIAGNOSIS — B351 Tinea unguium: Secondary | ICD-10-CM | POA: Diagnosis not present

## 2018-10-02 DIAGNOSIS — E119 Type 2 diabetes mellitus without complications: Secondary | ICD-10-CM | POA: Diagnosis not present

## 2018-10-02 DIAGNOSIS — M25562 Pain in left knee: Secondary | ICD-10-CM | POA: Diagnosis not present

## 2018-10-02 DIAGNOSIS — R3129 Other microscopic hematuria: Secondary | ICD-10-CM | POA: Diagnosis not present

## 2018-10-02 DIAGNOSIS — M542 Cervicalgia: Secondary | ICD-10-CM | POA: Diagnosis not present

## 2018-10-02 DIAGNOSIS — S161XXA Strain of muscle, fascia and tendon at neck level, initial encounter: Secondary | ICD-10-CM | POA: Diagnosis not present

## 2018-10-02 DIAGNOSIS — M545 Low back pain: Secondary | ICD-10-CM | POA: Diagnosis not present

## 2018-11-06 DIAGNOSIS — Z0000 Encounter for general adult medical examination without abnormal findings: Secondary | ICD-10-CM | POA: Diagnosis not present

## 2018-11-06 DIAGNOSIS — Z79899 Other long term (current) drug therapy: Secondary | ICD-10-CM | POA: Diagnosis not present

## 2018-11-06 DIAGNOSIS — E119 Type 2 diabetes mellitus without complications: Secondary | ICD-10-CM | POA: Diagnosis not present

## 2018-11-06 DIAGNOSIS — I712 Thoracic aortic aneurysm, without rupture: Secondary | ICD-10-CM | POA: Diagnosis not present

## 2018-11-06 DIAGNOSIS — R69 Illness, unspecified: Secondary | ICD-10-CM | POA: Diagnosis not present

## 2018-11-06 DIAGNOSIS — Z23 Encounter for immunization: Secondary | ICD-10-CM | POA: Diagnosis not present

## 2018-11-06 DIAGNOSIS — M109 Gout, unspecified: Secondary | ICD-10-CM | POA: Diagnosis not present

## 2018-11-06 DIAGNOSIS — I10 Essential (primary) hypertension: Secondary | ICD-10-CM | POA: Diagnosis not present

## 2018-11-06 DIAGNOSIS — E559 Vitamin D deficiency, unspecified: Secondary | ICD-10-CM | POA: Diagnosis not present

## 2018-11-06 DIAGNOSIS — E7800 Pure hypercholesterolemia, unspecified: Secondary | ICD-10-CM | POA: Diagnosis not present

## 2018-11-06 DIAGNOSIS — R8290 Unspecified abnormal findings in urine: Secondary | ICD-10-CM | POA: Diagnosis not present

## 2018-11-06 DIAGNOSIS — R0602 Shortness of breath: Secondary | ICD-10-CM | POA: Diagnosis not present

## 2018-11-22 DIAGNOSIS — I712 Thoracic aortic aneurysm, without rupture: Secondary | ICD-10-CM | POA: Diagnosis not present

## 2018-11-26 ENCOUNTER — Other Ambulatory Visit: Payer: Self-pay

## 2018-11-26 DIAGNOSIS — K551 Chronic vascular disorders of intestine: Secondary | ICD-10-CM

## 2018-12-10 ENCOUNTER — Encounter (HOSPITAL_COMMUNITY): Payer: Medicare HMO

## 2018-12-10 ENCOUNTER — Ambulatory Visit: Payer: Medicare HMO | Admitting: Family

## 2018-12-20 DIAGNOSIS — M79672 Pain in left foot: Secondary | ICD-10-CM | POA: Diagnosis not present

## 2018-12-20 DIAGNOSIS — B353 Tinea pedis: Secondary | ICD-10-CM | POA: Diagnosis not present

## 2018-12-20 DIAGNOSIS — M79671 Pain in right foot: Secondary | ICD-10-CM | POA: Diagnosis not present

## 2018-12-20 DIAGNOSIS — E119 Type 2 diabetes mellitus without complications: Secondary | ICD-10-CM | POA: Diagnosis not present

## 2019-02-12 ENCOUNTER — Other Ambulatory Visit: Payer: Self-pay | Admitting: *Deleted

## 2019-02-12 DIAGNOSIS — I712 Thoracic aortic aneurysm, without rupture, unspecified: Secondary | ICD-10-CM

## 2019-02-12 NOTE — Progress Notes (Unsigned)
CT

## 2019-02-28 DIAGNOSIS — E119 Type 2 diabetes mellitus without complications: Secondary | ICD-10-CM | POA: Diagnosis not present

## 2019-02-28 DIAGNOSIS — R69 Illness, unspecified: Secondary | ICD-10-CM | POA: Diagnosis not present

## 2019-02-28 DIAGNOSIS — M109 Gout, unspecified: Secondary | ICD-10-CM | POA: Diagnosis not present

## 2019-02-28 DIAGNOSIS — Z1159 Encounter for screening for other viral diseases: Secondary | ICD-10-CM | POA: Diagnosis not present

## 2019-02-28 DIAGNOSIS — I712 Thoracic aortic aneurysm, without rupture: Secondary | ICD-10-CM | POA: Diagnosis not present

## 2019-02-28 DIAGNOSIS — E7800 Pure hypercholesterolemia, unspecified: Secondary | ICD-10-CM | POA: Diagnosis not present

## 2019-02-28 DIAGNOSIS — R5383 Other fatigue: Secondary | ICD-10-CM | POA: Diagnosis not present

## 2019-02-28 DIAGNOSIS — I10 Essential (primary) hypertension: Secondary | ICD-10-CM | POA: Diagnosis not present

## 2019-02-28 DIAGNOSIS — Z79899 Other long term (current) drug therapy: Secondary | ICD-10-CM | POA: Diagnosis not present

## 2019-02-28 DIAGNOSIS — E559 Vitamin D deficiency, unspecified: Secondary | ICD-10-CM | POA: Diagnosis not present

## 2019-03-25 ENCOUNTER — Ambulatory Visit
Admission: RE | Admit: 2019-03-25 | Discharge: 2019-03-25 | Disposition: A | Discharge status: Home / Self Care | Payer: Medicare HMO | Source: Ambulatory Visit | Attending: Thoracic Surgery (Cardiothoracic Vascular Surgery) | Admitting: Thoracic Surgery (Cardiothoracic Vascular Surgery)

## 2019-03-25 ENCOUNTER — Other Ambulatory Visit: Payer: Self-pay

## 2019-03-25 DIAGNOSIS — R918 Other nonspecific abnormal finding of lung field: Secondary | ICD-10-CM | POA: Diagnosis not present

## 2019-03-25 DIAGNOSIS — I712 Thoracic aortic aneurysm, without rupture, unspecified: Secondary | ICD-10-CM

## 2019-03-25 MED ORDER — IOPAMIDOL (ISOVUE-370) INJECTION 76%
75.0000 mL | Freq: Once | INTRAVENOUS | Status: AC | PRN
  Administered 2019-03-25: 75 mL via INTRAVENOUS

## 2019-03-28 ENCOUNTER — Inpatient Hospital Stay (HOSPITAL_COMMUNITY): Admission: RE | Admit: 2019-03-28 | Payer: Medicare HMO | Source: Ambulatory Visit

## 2019-03-28 ENCOUNTER — Ambulatory Visit: Payer: Medicare HMO | Admitting: Family

## 2019-04-01 ENCOUNTER — Ambulatory Visit: Payer: Medicare HMO | Admitting: Thoracic Surgery (Cardiothoracic Vascular Surgery)

## 2019-04-01 NOTE — Progress Notes (Unsigned)
This encounter was created in error - please disregard.

## 2019-04-04 DIAGNOSIS — B354 Tinea corporis: Secondary | ICD-10-CM | POA: Diagnosis not present

## 2019-04-04 DIAGNOSIS — B353 Tinea pedis: Secondary | ICD-10-CM | POA: Diagnosis not present

## 2019-04-04 DIAGNOSIS — R69 Illness, unspecified: Secondary | ICD-10-CM | POA: Diagnosis not present

## 2019-04-04 DIAGNOSIS — E119 Type 2 diabetes mellitus without complications: Secondary | ICD-10-CM | POA: Diagnosis not present

## 2019-04-04 DIAGNOSIS — I10 Essential (primary) hypertension: Secondary | ICD-10-CM | POA: Diagnosis not present

## 2019-04-22 ENCOUNTER — Ambulatory Visit (INDEPENDENT_AMBULATORY_CARE_PROVIDER_SITE_OTHER): Payer: Medicare HMO | Admitting: Thoracic Surgery (Cardiothoracic Vascular Surgery)

## 2019-04-22 ENCOUNTER — Encounter: Payer: Self-pay | Admitting: *Deleted

## 2019-04-22 ENCOUNTER — Other Ambulatory Visit: Payer: Self-pay

## 2019-04-22 ENCOUNTER — Encounter: Payer: Self-pay | Admitting: Thoracic Surgery (Cardiothoracic Vascular Surgery)

## 2019-04-22 VITALS — BP 200/90 | HR 62 | Temp 97.3°F | Resp 16 | Ht 60.0 in | Wt 114.0 lb

## 2019-04-22 DIAGNOSIS — I712 Thoracic aortic aneurysm, without rupture, unspecified: Secondary | ICD-10-CM

## 2019-04-22 NOTE — Patient Instructions (Signed)
Correct dose of diltiazem is 360 mg daily, please make sure your prescription is correct.  Follow-up with [facility] medical regarding hypertension this week.

## 2019-04-22 NOTE — Progress Notes (Signed)
[address]       [place] [id number]             [phone]     HPI: Mrs. Annell GreeningDinh returns for a scheduled follow-up visit   Dwyane LuoDoan Husk is a 78 year old [place] woman with a past history significant for embolic stroke, atrial fibrillation, coronary artery disease, type 2 diabetes, hypertension, hyperlipidemia, and tobacco abuse.  She was found to have a 4 cm ascending aneurysm in 2018.  She has been followed since that time.  She does not speak [place].  She is accompanied by a neighbor/friend as well as a Chemical engineerprofessional interpreter.  She denies any chest pain or shortness of breath.  She says she has been taking her medications as instructed.  Her medication list has 2 different doses for diltiazem.  Past Medical History:  Diagnosis Date  . Atrial fibrillation (HCC)    dx in setting of stroke 4/12;  echo with EF 60-65%, mild LVH, mild AI, grade 1 diast dysfxn  . Coronary artery disease   . Diabetes mellitus without complication (HCC)   . History of CVA (cerebrovascular accident)    New diagnosis December 08, 2010  . Hx of cardiovascular stress test    a. Myoview 8/12:  EF 69%, no ischemia.   . Hyperlipidemia   . Hypertension   . Impaired glucose tolerance 05/08/2011  . Stroke [facility](H[facility]) 2012   no deficits  . Tobacco abuse    Ongoing (3-6 cigarettes per day, 25 pack year history)    Current Outpatient Medications  Medication Sig Dispense Refill  . atorvastatin (LIPITOR) 20 MG tablet TAKE 1 TABLET BY MOUTH EVERY DAY 30 tablet 5  . lisinopril (PRINIVIL,ZESTRIL) 5 MG tablet TAKE 1 TABLET BY MOUTH EVERY DAY 30 tablet 4  . metFORMIN (GLUCOPHAGE) 500 MG tablet Take 500 mg by mouth 2 (two) times daily with a meal.    . mirtazapine (REMERON) 15 MG tablet     . XARELTO 20 MG TABS tablet TAKE 1 TABLET(20 MG) BY MOUTH DAILY WITH SUPPER 30 tablet 3  . colchicine 0.6 MG tablet Take 1 tablet (0.6 mg total) by mouth daily. (Patient not taking: Reported on 04/22/2019) 30 tablet 0   . diltiazem (CARDIZEM CD) 240 MG 24 hr capsule Take by mouth.    . diltiazem (TIAZAC) 360 MG 24 hr capsule Take 1 capsule (360 mg total) by mouth daily. Please call and schedule an appointment in the AFIB clinic 30 capsule 4  . lactulose (CHRONULAC) 10 GM/15ML solution Take 15 mLs (10 g total) by mouth 2 (two) times daily as needed for mild constipation. (Patient not taking: Reported on 04/22/2019) 946 mL 1  . pantoprazole (PROTONIX) 40 MG tablet TAKE 1 TABLET(40 MG) BY MOUTH DAILY (Patient not taking: Reported on 04/22/2019) 30 tablet 3  . traMADol (ULTRAM) 50 MG tablet Take 1 tablet (50 mg total) by mouth every 12 (twelve) hours as needed. 50 tablet 0   No current facility-administered medications for this visit.     Physical Exam BP (!) 200/90 (BP Location: Left Arm)   Pulse 62   Temp (!) 97.3 F (36.3 C)   Resp 16   Ht 5' (1.524 m)   Wt 114 lb (51.7 kg)   SpO2 98% Comment: RA  BMI 22.7826 kg/m  78 year old woman in no acute distress Alert and oriented x3 with no focal motor deficit No carotid bruits Cardiac irregularly irregular rate and rhythm Lungs clear with  equal breath sounds bilaterally No peripheral edema  Diagnostic Tests: CT ANGIOGRAPHY CHEST WITH CONTRAST  TECHNIQUE: Multidetector CT imaging of the chest was performed using the standard protocol during bolus administration of intravenous contrast. Multiplanar CT image reconstructions and MIPs were obtained to evaluate the vascular anatomy.  CONTRAST:  88mL ISOVUE-370 IOPAMIDOL (ISOVUE-370) INJECTION 76%  COMPARISON:  October 14, 2016  FINDINGS: Cardiovascular: The ascending thoracic aortic diameter measures 4.0 cm, essentially stable. Measured diameter at the sinotubular junction is 2.9 cm. Measured value in the descending thoracic aorta at the main pulmonary outflow tract level measures 2.5 x 2.5 cm. There is no thoracic aortic dissection. There are foci of calcification in the proximal left subclavian  artery. Visualized great vessels otherwise appear unremarkable. There are foci of aortic atherosclerosis. There is no demonstrable pulmonary embolus. No pericardial effusion or pericardial thickening evident.  Mediastinum/Nodes: Thyroid appears normal. There is no appreciable thoracic adenopathy. No esophageal lesions are evident.  Lungs/Pleura: There is mild atelectatic change in the left base. There is no appreciable edema or consolidation. No pleural effusion evident. On axial slice 61 series 7, there is a 3 mm nodular opacity abutting the pleura in the superior segment of the left lower lobe.  Upper Abdomen: There is upper abdominal aortic atherosclerosis. Visualized upper abdominal structures otherwise appear unremarkable.  Musculoskeletal: There are no blastic or lytic bone lesions. No chest wall lesions are evident.  Review of the MIP images confirms the above findings.  IMPRESSION: 1. Stable ascending thoracic aortic diameter of 4.0 cm. Recommend annual imaging followup by CTA or MRA. This recommendation follows 2010 ACCF/AHA/AATS/ACR/ASA/SCA/SCAI/SIR/STS/SVM Guidelines for the Diagnosis and Management of Patients with Thoracic Aortic Disease. Circulation. 2010; 121: M086-P619. Aortic aneurysm NOS (ICD10-I71.9) 2. Aortic atherosclerosis. 3. 3 mm nodular opacity abutting the pleura in the superior segment of the left lower lobe. This could represent a small subpleural lymph node but is nonspecific. No follow-up needed if patient is low-risk. Non-contrast chest CT can be considered in 12 months if patient is high-risk. This recommendation follows the consensus statement: Guidelines for Management of Incidental Pulmonary Nodules Detected on CT Images: From the Fleischner Society 2017; Radiology 2017; 284:228-243. 4. Areas of mild atelectatic change.  No edema or consolidation. 5. No evident thoracic adenopathy.  Aortic Atherosclerosis (ICD10-I70.0).    Electronically Signed   By: Lowella Grip III M.D.   On: 03/25/2019 09:30 I personally reviewed the CT images and concur with the findings noted above  Impression: China Deitrick is a 78 year old woman with a past medical history significant for atrial fibrillation, embolic stroke, hypertension, hyperlipidemia, aortic atherosclerosis, ascending aortic aneurysm, coronary artery disease, type 2 diabetes, and tobacco abuse.  Ascending aneurysm/aortic atherosclerosis-ascending aneurysm is stable at 4.1 cm.  Needs continued annual follow-up.  Importance of blood pressure control emphasized.  Atherosclerotic cardiovascular disease-on Lipitor.  Hypertension-blood pressure markedly elevated today.  She says that she is taking her medications as instructed.  She had diltiazem listed twice at different doses in her medication list.  We checked with [facility] medical and her correct dose was 360 mg daily.  I recommended that she follow-up with [facility] medical regarding her blood pressure this week.  Plan: Follow-up with [facility] regarding hypertension Return in 1 year with CT Angio of chest  Melrose Nakayama, MD [facility] [phone]

## 2019-05-07 DIAGNOSIS — Z1211 Encounter for screening for malignant neoplasm of colon: Secondary | ICD-10-CM | POA: Diagnosis not present

## 2019-05-07 DIAGNOSIS — Z1159 Encounter for screening for other viral diseases: Secondary | ICD-10-CM | POA: Diagnosis not present

## 2019-06-11 DIAGNOSIS — Z1211 Encounter for screening for malignant neoplasm of colon: Secondary | ICD-10-CM | POA: Diagnosis not present

## 2019-06-11 DIAGNOSIS — K635 Polyp of colon: Secondary | ICD-10-CM | POA: Diagnosis not present

## 2019-06-11 DIAGNOSIS — E119 Type 2 diabetes mellitus without complications: Secondary | ICD-10-CM | POA: Diagnosis not present

## 2019-06-11 DIAGNOSIS — Z01818 Encounter for other preprocedural examination: Secondary | ICD-10-CM | POA: Diagnosis not present

## 2019-06-16 DIAGNOSIS — K635 Polyp of colon: Secondary | ICD-10-CM | POA: Diagnosis not present

## 2019-06-20 DIAGNOSIS — H2512 Age-related nuclear cataract, left eye: Secondary | ICD-10-CM | POA: Diagnosis not present

## 2019-06-20 DIAGNOSIS — H11001 Unspecified pterygium of right eye: Secondary | ICD-10-CM | POA: Diagnosis not present

## 2019-06-20 DIAGNOSIS — H2513 Age-related nuclear cataract, bilateral: Secondary | ICD-10-CM | POA: Diagnosis not present

## 2019-06-20 DIAGNOSIS — H11152 Pinguecula, left eye: Secondary | ICD-10-CM | POA: Diagnosis not present

## 2019-06-20 DIAGNOSIS — H52201 Unspecified astigmatism, right eye: Secondary | ICD-10-CM | POA: Diagnosis not present

## 2019-06-20 DIAGNOSIS — Z7984 Long term (current) use of oral hypoglycemic drugs: Secondary | ICD-10-CM | POA: Diagnosis not present

## 2019-06-20 DIAGNOSIS — E119 Type 2 diabetes mellitus without complications: Secondary | ICD-10-CM | POA: Diagnosis not present

## 2019-06-20 DIAGNOSIS — H5212 Myopia, left eye: Secondary | ICD-10-CM | POA: Diagnosis not present

## 2019-06-20 DIAGNOSIS — H5201 Hypermetropia, right eye: Secondary | ICD-10-CM | POA: Diagnosis not present

## 2019-06-20 DIAGNOSIS — H268 Other specified cataract: Secondary | ICD-10-CM | POA: Diagnosis not present

## 2019-07-23 DIAGNOSIS — Z01812 Encounter for preprocedural laboratory examination: Secondary | ICD-10-CM | POA: Diagnosis not present

## 2019-07-23 DIAGNOSIS — Z20828 Contact with and (suspected) exposure to other viral communicable diseases: Secondary | ICD-10-CM | POA: Diagnosis not present

## 2019-07-23 DIAGNOSIS — H268 Other specified cataract: Secondary | ICD-10-CM | POA: Diagnosis not present

## 2019-07-23 DIAGNOSIS — H2513 Age-related nuclear cataract, bilateral: Secondary | ICD-10-CM | POA: Diagnosis not present

## 2019-07-23 DIAGNOSIS — H2512 Age-related nuclear cataract, left eye: Secondary | ICD-10-CM | POA: Diagnosis not present

## 2019-07-29 DIAGNOSIS — H268 Other specified cataract: Secondary | ICD-10-CM | POA: Diagnosis not present

## 2019-07-29 DIAGNOSIS — E1136 Type 2 diabetes mellitus with diabetic cataract: Secondary | ICD-10-CM | POA: Diagnosis not present

## 2019-07-29 DIAGNOSIS — Z7984 Long term (current) use of oral hypoglycemic drugs: Secondary | ICD-10-CM | POA: Diagnosis not present

## 2019-07-29 DIAGNOSIS — H2512 Age-related nuclear cataract, left eye: Secondary | ICD-10-CM | POA: Diagnosis not present

## 2020-03-17 ENCOUNTER — Other Ambulatory Visit: Payer: Self-pay | Admitting: *Deleted

## 2020-03-17 DIAGNOSIS — I712 Thoracic aortic aneurysm, without rupture, unspecified: Secondary | ICD-10-CM

## 2020-05-04 ENCOUNTER — Ambulatory Visit
Admission: RE | Admit: 2020-05-04 | Discharge: 2020-05-04 | Disposition: A | Discharge status: Home / Self Care | Payer: Medicare HMO | Source: Ambulatory Visit | Attending: Thoracic Surgery (Cardiothoracic Vascular Surgery) | Admitting: Thoracic Surgery (Cardiothoracic Vascular Surgery)

## 2020-05-04 ENCOUNTER — Ambulatory Visit: Payer: Medicare HMO | Admitting: Thoracic Surgery (Cardiothoracic Vascular Surgery)

## 2020-05-04 DIAGNOSIS — I712 Thoracic aortic aneurysm, without rupture, unspecified: Secondary | ICD-10-CM

## 2020-05-04 MED ORDER — IOPAMIDOL (ISOVUE-370) INJECTION 76%
75.0000 mL | Freq: Once | INTRAVENOUS | Status: AC | PRN
  Administered 2020-05-04: 75 mL via INTRAVENOUS

## 2020-05-25 ENCOUNTER — Other Ambulatory Visit: Payer: Self-pay

## 2020-05-25 ENCOUNTER — Ambulatory Visit (INDEPENDENT_AMBULATORY_CARE_PROVIDER_SITE_OTHER): Payer: Medicare HMO | Admitting: Thoracic Surgery (Cardiothoracic Vascular Surgery)

## 2020-05-25 VITALS — BP 154/99 | HR 94 | Resp 18 | Wt 117.4 lb

## 2020-05-25 DIAGNOSIS — I712 Thoracic aortic aneurysm, without rupture, unspecified: Secondary | ICD-10-CM

## 2020-05-25 NOTE — Progress Notes (Signed)
[address]       [name] [address]             [phone]     HPI: Ms. Sirmons returns for follow-up of her ascending aneurysm  She is accompanied by a professional interpreter  Linda [name] is a 79 year old [place] woman with a past history significant for chronic atrial fibrillation, embolic stroke, hypertension, hyperlipidemia, aortic atherosclerosis, ascending aortic aneurysm, coronary artery disease, type 2 diabetes, and tobacco abuse.  Her aneurysm was 1st found in 2018.  We have followed her since then.  She says that about 2 to 3 months ago she started having more problems with shortness of breath.  She continues to smoke but says she is down to 1 cigarette a day.  She is not having any chest pain, pressure, or tightness.  Past Medical History:  Diagnosis Date  . Atrial fibrillation (HCC)    dx in setting of stroke 4/12;  echo with EF 60-65%, mild LVH, mild AI, grade 1 diast dysfxn  . Coronary artery disease   . Diabetes mellitus without complication (HCC)   . History of CVA (cerebrovascular accident)    New diagnosis December 08, 2010  . Hx of cardiovascular stress test    a. Myoview 8/12:  EF 69%, no ischemia.   . Hyperlipidemia   . Hypertension   . Impaired glucose tolerance 05/08/2011  . Stroke [facility]) 2012   no deficits  . Tobacco abuse    Ongoing (3-6 cigarettes per day, 25 pack year history)    Current Outpatient Medications  Medication Sig Dispense Refill  . atorvastatin (LIPITOR) 20 MG tablet TAKE 1 TABLET BY MOUTH EVERY DAY 30 tablet 5  . lisinopril (PRINIVIL,ZESTRIL) 5 MG tablet TAKE 1 TABLET BY MOUTH EVERY DAY 30 tablet 4  . metFORMIN (GLUCOPHAGE) 500 MG tablet Take 500 mg by mouth 2 (two) times daily with a meal.    . mirtazapine (REMERON) 15 MG tablet     . XARELTO 20 MG TABS tablet TAKE 1 TABLET(20 MG) BY MOUTH DAILY WITH SUPPER 30 tablet 3   No current facility-administered medications for this visit.    Physical Exam BP (!)  154/99   Pulse 94   Resp 18   Wt 117 lb 6.4 oz (53.3 kg)   SpO2 100% Comment: RA with mask on  BMI 22.93 kg/m  Elderly Asian woman in no acute distress no focal motor deficit cardiac irregularly irregular lungs clear bilaterally no peripheral edema  Diagnostic Tests: CT ANGIOGRAPHY CHEST WITH CONTRAST  TECHNIQUE: Multidetector CT imaging of the chest was performed using the standard protocol during bolus administration of intravenous contrast. Multiplanar CT image reconstructions and MIPs were obtained to evaluate the vascular anatomy.  CONTRAST:  29mL ISOVUE-370 IOPAMIDOL (ISOVUE-370) INJECTION 76%  COMPARISON:  03/25/2019  FINDINGS: Cardiovascular: Scattered aortic atherosclerosis. Maximum ascending thoracic aortic diameter 3.8 cm. Heart is normal size. No filling defects in the pulmonary arteries to suggest pulmonary emboli.  Mediastinum/Nodes: No mediastinal, hilar, or axillary adenopathy. Trachea and esophagus are unremarkable. Thyroid unremarkable.  Lungs/Pleura: Lungs are clear. No focal airspace opacities or suspicious nodules. No effusions. Previously seen 3 mm nodule in the superior segment of the left lower lobe no longer visualized.  Upper Abdomen: Small layering gallstones within the gallbladder.  Musculoskeletal: Chest wall soft tissues are unremarkable. No acute bony abnormality.  Review of the MIP images confirms the above findings.  IMPRESSION: No measurable thoracic aortic aneurysm currently. Maximum  diameter of the ascending thoracic aorta 3.8 cm.  No acute cardiopulmonary disease.  Aortic Atherosclerosis (ICD10-I70.0).  Cholelithiasis.   Electronically Signed   By: Charlett Nose M.D.   On: 05/04/2020 12:12  I personally reviewed the CT images.  My measurement comes out to 4 cm.  Impression: Linda [name] is a 79 year old Asian woman with a past history significant for chronic atrial fibrillation, embolic stroke,  hypertension, hyperlipidemia, aortic atherosclerosis, ascending aortic aneurysm, coronary artery disease, type 2 diabetes, and tobacco abuse.   Thoracic aortic atherosclerosis/ascending aneurysm-my measurements come out to 4 cm which is stable over time.  She needs continued annual follow-up.  Blood pressure control is the mainstay of treatment.  Dyspnea-she feels short of breath with exertion and even with talking.  She doesn't have any overt heart failure.  I don't know if this might be in someway related to her atrial fibrillation.  In any event it does look like she is seeing a cardiologist in a couple of years so we'll give a try to get her in with the atrial fibrillation clinic.  She is anticoagulated.  Not sure rate control is all that good.  Plan: Atrial fibrillation clinic return in 1 year with CT angio chest  Loreli Slot, MD [facility] [phone]

## 2020-06-02 ENCOUNTER — Encounter (HOSPITAL_COMMUNITY): Payer: Self-pay | Admitting: Physician Assistant

## 2020-06-02 ENCOUNTER — Other Ambulatory Visit: Payer: Self-pay

## 2020-06-02 ENCOUNTER — Ambulatory Visit (HOSPITAL_COMMUNITY)
Admission: RE | Admit: 2020-06-02 | Discharge: 2020-06-02 | Disposition: A | Discharge status: Home / Self Care | Payer: Medicare HMO | Source: Ambulatory Visit | Attending: Physician Assistant | Admitting: Physician Assistant

## 2020-06-02 VITALS — BP 142/80 | HR 66 | Ht 60.0 in | Wt 120.0 lb

## 2020-06-02 DIAGNOSIS — I480 Paroxysmal atrial fibrillation: Secondary | ICD-10-CM

## 2020-06-02 DIAGNOSIS — Z79899 Other long term (current) drug therapy: Secondary | ICD-10-CM | POA: Insufficient documentation

## 2020-06-02 DIAGNOSIS — I4819 Other persistent atrial fibrillation: Secondary | ICD-10-CM | POA: Insufficient documentation

## 2020-06-02 DIAGNOSIS — Z7901 Long term (current) use of anticoagulants: Secondary | ICD-10-CM | POA: Insufficient documentation

## 2020-06-02 DIAGNOSIS — F17210 Nicotine dependence, cigarettes, uncomplicated: Secondary | ICD-10-CM | POA: Insufficient documentation

## 2020-06-02 DIAGNOSIS — Z8673 Personal history of transient ischemic attack (TIA), and cerebral infarction without residual deficits: Secondary | ICD-10-CM | POA: Insufficient documentation

## 2020-06-02 DIAGNOSIS — I2510 Atherosclerotic heart disease of native coronary artery without angina pectoris: Secondary | ICD-10-CM | POA: Diagnosis not present

## 2020-06-02 DIAGNOSIS — D6869 Other thrombophilia: Secondary | ICD-10-CM | POA: Diagnosis not present

## 2020-06-02 DIAGNOSIS — E119 Type 2 diabetes mellitus without complications: Secondary | ICD-10-CM | POA: Diagnosis not present

## 2020-06-02 DIAGNOSIS — E785 Hyperlipidemia, unspecified: Secondary | ICD-10-CM | POA: Diagnosis not present

## 2020-06-02 DIAGNOSIS — I10 Essential (primary) hypertension: Secondary | ICD-10-CM | POA: Diagnosis not present

## 2020-06-02 NOTE — Progress Notes (Signed)
Primary Care Physician: [name], MD Primary Electrophysiologist: Dr Graciela Husbands (remotely) Referring Physician: Dr Pura Spice is a 79 y.o. female with a history of CAD, DM, prior CVA, HTN, HLD, tobacco abuse, and persistent atrial fibrillation who presents for consultation in the [facility].  The patient was initially diagnosed with atrial fibrillation remotely and had previously been on propafenone. She had followed with Dr Graciela Husbands but was lost to follow up in 2018. Patient is on Xarelto for a CHADS2VASC score of 8. Patient reports that over the last year she has had intermittent episodes of SOB and palpitations. This occurs 2-3 times in 2 weeks. The symptoms last ~1 minute. She is in SR today. She denies any bleeding issues on anticoagulation.   Today, she denies symptoms of chest pain, orthopnea, PND, lower extremity edema, dizziness, presyncope, syncope, snoring, daytime somnolence, bleeding, or neurologic sequela. The patient is tolerating medications without difficulties and is otherwise without complaint today.    Atrial Fibrillation Risk Factors:  she does not have symptoms or diagnosis of sleep apnea. she does not have a history of rheumatic fever.   she has a BMI of Body mass index is 23.44 kg/m.Marland Kitchen Filed Weights   06/02/20 1034  Weight: 54.4 kg    Family History  Problem Relation Age of Onset  . Coronary artery disease Neg Hx      Atrial Fibrillation Management history:  Previous antiarrhythmic drugs: propafenone  Previous cardioversions: none Previous ablations: none CHADS2VASC score: 8 Anticoagulation history: warfarin, Xarelto   Past Medical History:  Diagnosis Date  . Atrial fibrillation (HCC)    dx in setting of stroke 4/12;  echo with EF 60-65%, mild LVH, mild AI, grade 1 diast dysfxn  . Coronary artery disease   . Diabetes mellitus without complication (HCC)   . History of CVA (cerebrovascular accident)     New diagnosis December 08, 2010  . Hx of cardiovascular stress test    a. Myoview 8/12:  EF 69%, no ischemia.   . Hyperlipidemia   . Hypertension   . Impaired glucose tolerance 05/08/2011  . Stroke [facility]) 2012   no deficits  . Tobacco abuse    Ongoing (3-6 cigarettes per day, 25 pack year history)   Past Surgical History:  Procedure Laterality Date  . COLONOSCOPY N/A 02/01/2016   Procedure: COLONOSCOPY;  Surgeon: Ruffin Frederick, MD;  Location: [facility] ENDOSCOPY;  Service: Gastroenterology;  Laterality: N/A;    Current Outpatient Medications  Medication Sig Dispense Refill  . atorvastatin (LIPITOR) 20 MG tablet TAKE 1 TABLET BY MOUTH EVERY DAY 30 tablet 5  . lisinopril (PRINIVIL,ZESTRIL) 5 MG tablet TAKE 1 TABLET BY MOUTH EVERY DAY 30 tablet 4  . metFORMIN (GLUCOPHAGE) 500 MG tablet Take 500 mg by mouth 2 (two) times daily with a meal.    . mirtazapine (REMERON) 15 MG tablet     . XARELTO 20 MG TABS tablet TAKE 1 TABLET(20 MG) BY MOUTH DAILY WITH SUPPER 30 tablet 3   No current facility-administered medications for this encounter.    No Known Allergies  Social History   Socioeconomic History  . Marital status: Widowed    Spouse name: Not on file  . Number of children: Not on file  . Years of education: Not on file  . Highest education level: Not on file  Occupational History    Employer: [employer]  . Occupation: Not working  Tobacco Use  . Smoking status:  Light Tobacco Smoker    Packs/day: 0.25    Years: 25.00    Pack years: 6.25    Types: Cigarettes  . Smokeless tobacco: Never Used  . Tobacco comment: Down to 1 cigarette per day.   Substance and Sexual Activity  . Alcohol use: No  . Drug use: No  . Sexual activity: Not on file  Other Topics Concern  . Not on file  Social History Narrative   Lives in [place] with her daughters   Regular diet   No regular exercise but active and independent with all daily activities   Social Determinants of Health    Financial Resource Strain:   . Difficulty of Paying Living Expenses: Not on file  Food Insecurity:   . Worried About Programme researcher, broadcasting/film/video in the Last Year: Not on file  . Ran Out of Food in the Last Year: Not on file  Transportation Needs:   . Lack of Transportation (Medical): Not on file  . Lack of Transportation (Non-Medical): Not on file  Physical Activity:   . Days of Exercise per Week: Not on file  . Minutes of Exercise per Session: Not on file  Stress:   . Feeling of Stress : Not on file  Social Connections:   . Frequency of Communication with Friends and Family: Not on file  . Frequency of Social Gatherings with Friends and Family: Not on file  . Attends Religious Services: Not on file  . Active Member of Clubs or Organizations: Not on file  . Attends Banker Meetings: Not on file  . Marital Status: Not on file  Intimate Partner Violence:   . Fear of Current or Ex-Partner: Not on file  . Emotionally Abused: Not on file  . Physically Abused: Not on file  . Sexually Abused: Not on file     ROS- All systems are reviewed and negative except as per the HPI above.  Physical Exam: Vitals:   06/02/20 1034  BP: (!) 142/80  Pulse: 66  Weight: 54.4 kg  Height: 5' (1.524 m)    GEN- The patient is well appearing elderly female, alert and oriented x 3 today.   Head- normocephalic, atraumatic Eyes-  Sclera clear, conjunctiva pink Ears- hearing intact Oropharynx- clear Neck- supple  Lungs- Clear to ausculation bilaterally, normal work of breathing Heart- Regular rate and rhythm, no murmurs, rubs or gallops  GI- soft, NT, ND, + BS Extremities- no clubbing, cyanosis, or edema MS- no significant deformity or atrophy Skin- no rash or lesion Psych- euthymic mood, full affect Neuro- strength and sensation are intact  Wt Readings from Last 3 Encounters:  06/02/20 54.4 kg  05/25/20 53.3 kg  04/22/19 51.7 kg    EKG today demonstrates SR HR 66, PAC, PR 158,  QRS 74, QTc 473  Echo 04/03/18 demonstrated  EF 55-60%, mild LVH, mild AI, normal LA, no valvular issues.  Epic records are reviewed at length today  CHA2DS2-VASc Score = 8  The patient's score is based upon: CHF History: 0 HTN History: 1 Diabetes History: 1 Stroke History: 2 Vascular Disease History: 1 Age Score: 2 Gender Score: 1      ASSESSMENT AND PLAN: 1. Persistent Atrial Fibrillation (ICD10:  I48.19) The patient's CHA2DS2-VASc score is 8, indicating a 10.8% annual risk of stroke.   Patient in SR today. Unclear symptoms of palpitations/SOB. Will check 14 day Zio monitor to evaluate afib burden. Continue Xarelto 20 mg daily.  2. Secondary Hypercoagulable State (ICD10:  J94.17) The patient is at significant risk for stroke/thromboembolism based upon her CHA2DS2-VASc Score of 8.  Continue Rivaroxaban (Xarelto).   3. CAD No anginal symptoms.  4. HTN Stable, no changes today.   Follow up in the AF clinic after monitor results. Will also refer her to general cardiology to establish care given her h/o CAD.   Jorja Loa PA-C Afib Clinic [facility] [address] [place], [place] [address] [phone] 06/02/2020 11:36 AM

## 2020-06-28 NOTE — Addendum Note (Signed)
Encounter addended by: Shona Simpson, RN on: 06/28/2020 10:17 AM  Actions taken: Imaging Exam ended

## 2020-07-01 ENCOUNTER — Ambulatory Visit (INDEPENDENT_AMBULATORY_CARE_PROVIDER_SITE_OTHER): Payer: Medicare HMO | Admitting: Cardiology

## 2020-07-01 ENCOUNTER — Encounter: Payer: Self-pay | Admitting: Cardiology

## 2020-07-01 ENCOUNTER — Other Ambulatory Visit: Payer: Self-pay

## 2020-07-01 VITALS — BP 170/80 | HR 68 | Ht 60.0 in | Wt 121.0 lb

## 2020-07-01 DIAGNOSIS — I4819 Other persistent atrial fibrillation: Secondary | ICD-10-CM | POA: Diagnosis not present

## 2020-07-01 DIAGNOSIS — Z79899 Other long term (current) drug therapy: Secondary | ICD-10-CM

## 2020-07-01 DIAGNOSIS — E7800 Pure hypercholesterolemia, unspecified: Secondary | ICD-10-CM | POA: Diagnosis not present

## 2020-07-01 DIAGNOSIS — I10 Essential (primary) hypertension: Secondary | ICD-10-CM

## 2020-07-01 MED ORDER — METOPROLOL SUCCINATE ER 50 MG PO TB24: 50.0000 mg | ORAL_TABLET | Freq: Every day | ORAL | 3 refills | Status: DC

## 2020-07-01 MED ORDER — LISINOPRIL 20 MG PO TABS: 20.0000 mg | ORAL_TABLET | Freq: Every day | ORAL | 3 refills | Status: DC

## 2020-07-01 NOTE — Progress Notes (Signed)
Cardiology Office Note:    Date:  07/01/2020   ID:  Linda [name], DOB 08/29/1940, MRN [id number]  PCP:  Hoy RegisterNewlin, Enobong, MD  [facility] Cardiologist:  Donato SchultzMark Brantly Kalman, MD  [facility] Electrophysiologist:  None   Referring MD: Danice GoltzFenton, Clint R, PA     History of Present Illness:    Linda [name] is a 79 y.o. female here for evaluation of atrial fibrillation at the request of Michail JewelsRicky Fenton, Steve Hendrickson.  Has coronary disease diabetes prior 2X embolic stroke with AFIB hypertension dilated aorta tobacco use persistent atrial fibrillation.  She had been lost to follow-up with Dr. Graciela HusbandsKlein from 2018.  CHA2DS2-VASc score is 8.  4cm ascending aortic aneurysm.  No prior MI  Has intermittent shortness of breath palpitations 2-3 times a week symptoms usually last about 1 minute.  In review of atrial fibrillation clinic note she was in sinus rhythm during that encounter on 06/02/2020.  Translator for help.      Past Medical History:  Diagnosis Date  . Atrial fibrillation (HCC)    dx in setting of stroke 4/12;  echo with EF 60-65%, mild LVH, mild AI, grade 1 diast dysfxn  . Coronary artery disease   . Diabetes mellitus without complication (HCC)   . History of CVA (cerebrovascular accident)    New diagnosis December 08, 2010  . Hx of cardiovascular stress test    a. Myoview 8/12:  EF 69%, no ischemia.   . Hyperlipidemia   . Hypertension   . Impaired glucose tolerance 05/08/2011  . Stroke [facility](HCC) 2012   no deficits  . Tobacco abuse    Ongoing (3-6 cigarettes per day, 25 pack year history)    Past Surgical History:  Procedure Laterality Date  . COLONOSCOPY N/A 02/01/2016   Procedure: COLONOSCOPY;  Surgeon: Ruffin FrederickSteven Paul Armbruster, MD;  [facility];  Service: Gastroenterology;  Laterality: N/A;    Current Medications: Current Meds  Medication Sig  . atorvastatin (LIPITOR) 20 MG tablet TAKE 1 TABLET BY MOUTH EVERY DAY  . metFORMIN (GLUCOPHAGE) 500 MG tablet Take 500  mg by mouth 2 (two) times daily with a meal.  . mirtazapine (REMERON) 15 MG tablet   . XARELTO 20 MG TABS tablet TAKE 1 TABLET(20 MG) BY MOUTH DAILY WITH SUPPER  . [DISCONTINUED] lisinopril (PRINIVIL,ZESTRIL) 5 MG tablet TAKE 1 TABLET BY MOUTH EVERY DAY     Allergies:   Patient has no known allergies.   Social History   Socioeconomic History  . Marital status: Widowed    Spouse name: Not on file  . Number of children: Not on file  . Years of education: Not on file  . Highest education level: Not on file  Occupational History    Employer: [employer]  . Occupation: Not working  Tobacco Use  . Smoking status: Light Tobacco Smoker    Packs/day: 0.25    Years: 25.00    Pack years: 6.25    Types: Cigarettes  . Smokeless tobacco: Never Used  . Tobacco comment: Down to 1 cigarette per day.   Substance and Sexual Activity  . Alcohol use: No  . Drug use: No  . Sexual activity: Not on file  Other Topics Concern  . Not on file  Social History Narrative   [place] in [place] with her daughters   Regular diet   No regular exercise but active and independent with all daily activities   Social Determinants of Health   Financial Resource Strain:   .  Difficulty of Paying Living Expenses: Not on file  Food Insecurity:   . Worried About Programme researcher, broadcasting/film/video in the Last Year: Not on file  . Ran Out of Food in the Last Year: Not on file  Transportation Needs:   . Lack of Transportation (Medical): Not on file  . Lack of Transportation (Non-Medical): Not on file  Physical Activity:   . Days of Exercise per Week: Not on file  . Minutes of Exercise per Session: Not on file  Stress:   . Feeling of Stress : Not on file  Social Connections:   . Frequency of Communication with Friends and Family: Not on file  . Frequency of Social Gatherings with Friends and Family: Not on file  . Attends Religious Services: Not on file  . Active Member of Clubs or Organizations: Not on file  . Attends  Banker Meetings: Not on file  . Marital Status: Not on file     Family History: The patient's family history is negative for Coronary artery disease.  ROS:   Please see the history of present illness.   No fevers chills nausea denies any fevers chills nausea vomiting syncope bleeding  All other systems reviewed and are negative.  EKGs/Labs/Other Studies Reviewed:    The following studies were reviewed today  CT scan of chest 05/04/2020 IMPRESSION: No measurable thoracic aortic aneurysm currently. Maximum diameter of the ascending thoracic aorta 3.8 cm.  No acute cardiopulmonary disease.  Aortic Atherosclerosis (ICD10-I70.0).  Cholelithiasis.   Electronically Signed   By: Charlett Nose M.D.  EKG: 06/02/2020-sinus with PACs.  Zio patch monitor showed 21% burden of atrial fibrillation-A. fib heart rate was 136 bpm on average.  Recent Labs: No results found for requested labs within last 8760 hours.  Recent Lipid Panel    Component Value Date/Time   CHOL 207 (H) 10/13/2016 0058   TRIG 102 10/13/2016 0058   HDL 39 (L) 10/13/2016 0058   CHOLHDL 5.3 10/13/2016 0058   VLDL 20 10/13/2016 0058   LDLCALC 148 (H) 10/13/2016 0058   LDLDIRECT 124.5 08/07/2012 1128     Risk Assessment/Calculations:     CHA2DS2-VASc Score = 8  This indicates a 10.8% annual risk of stroke. The patient's score is based upon: CHF History: 0 HTN History: 1 Diabetes History: 1 Stroke History: 2 Vascular Disease History: 1 Age Score: 2 Gender Score: 1      Physical Exam:    VS:  BP (!) 170/80   Pulse 68   Ht 5' (1.524 m)   Wt 121 lb (54.9 kg)   SpO2 99%   BMI 23.63 kg/m     Wt Readings from Last 3 Encounters:  07/01/20 121 lb (54.9 kg)  06/02/20 120 lb (54.4 kg)  05/25/20 117 lb 6.4 oz (53.3 kg)     GEN:  Well nourished, well developed in no acute distress HEENT: Normal NECK: No JVD; No carotid bruits LYMPHATICS: No lymphadenopathy CARDIAC: RRR, no  murmurs, rubs, gallops RESPIRATORY:  Clear to auscultation without rales, wheezing or rhonchi  ABDOMEN: Soft, non-tender, non-distended MUSCULOSKELETAL:  No edema; No deformity  SKIN: Warm and dry NEUROLOGIC:  Alert and oriented x 3 PSYCHIATRIC:  Normal affect   ASSESSMENT:    1. Persistent atrial fibrillation (HCC)   2. Pure hypercholesterolemia   3. Primary hypertension   4. Medication management    PLAN:    In order of problems listed above:  Persistent atrial fibrillation Essential hypertension uncontrolled Hyperlipidemia Chronic  anticoagulation Ascending aortic aneurysm  -Was in sinus rhythm during A. fib clinic visit. -ZIO was used on 06/28/2020 that showed 21% atrial fibrillation burden with average heart rate of 130 bpm in atrial fibrillation. -We will start Toprol-XL 50 mg -We will increase lisinopril to 20 mg a day.  From 5 mg a day. -We will check CBC, complete metabolic profile, LDL, TSH -We will check an echocardiogram. -1 month follow-up with the hypertension clinic -Continue Xarelto -Continue atorvastatin 20 -40 mm ascending aortic aneurysm.  Dr. Dorris Fetch has been monitoring.  We will optimize blood pressure control.  Adding beta-blocker.  Increasing ACE inhibitor.     Shared Decision Making/Informed Consent      Medication Adjustments/Labs and Tests Ordered: Current medicines are reviewed at length with the patient today.  Concerns regarding medicines are outlined above.  Orders Placed This Encounter  Procedures  . Lipid panel  . Comprehensive metabolic panel  . CBC  . TSH  . ECHOCARDIOGRAM COMPLETE   Meds ordered this encounter  Medications  . lisinopril (ZESTRIL) 20 MG tablet    Sig: Take 1 tablet (20 mg total) by mouth daily.    Dispense:  90 tablet    Refill:  3  . metoprolol succinate (TOPROL-XL) 50 MG 24 hr tablet    Sig: Take 1 tablet (50 mg total) by mouth daily. Take with or immediately following a meal.    Dispense:  90 tablet     Refill:  3    Patient Instructions  Medication Instructions:  Please increase your Lisinopril 20 mg. Start Metoprolol Succinate 50 mg once a day. Continue all other medications as listed.  *If you need a refill on your cardiac medications before your next appointment, please call your pharmacy*  Lab Work: Please have blood work today (Lipid, CMP, CBC and TSH)  If you have labs (blood work) drawn today and your tests are completely normal, you will receive your results only by: Marland Kitchen MyChart Message (if you have MyChart) OR . A paper copy in the mail If you have any lab test that is abnormal or we need to change your treatment, we will call you to review the results.  Testing/Procedures: Your physician has requested that you have an echocardiogram. Echocardiography is a painless test that uses sound waves to create images of your heart. It provides your doctor with information about the size and shape of your heart and how well your heart's chambers and valves are working. This procedure takes approximately one hour. There are no restrictions for this procedure.  Follow-Up: At [facility], you and your health needs are our priority.  As part of our continuing mission to provide you with exceptional heart care, we have created designated Provider Care Teams.  These Care Teams include your primary Cardiologist (physician) and Advanced Practice Providers (APPs -  Physician Assistants and Nurse Practitioners) who all work together to provide you with the care you need, when you need it.  We recommend signing up for the patient portal called "MyChart".  Sign up information is provided on this After Visit Summary.  MyChart is used to connect with patients for Virtual Visits (Telemedicine).  Patients are able to view lab/test results, encounter notes, upcoming appointments, etc.  Non-urgent messages can be sent to your provider as well.   To learn more about what you can do with MyChart, go to  [email or web address].    Your next appointment:   1 month(s)  The format for your next appointment:  In Person  Provider:   In Hypertension Clinic.   Thank you for choosing [facility]!!        Signed, [name], MD  07/01/2020 11:44 AM    [facility]

## 2020-07-01 NOTE — Patient Instructions (Addendum)
Medication Instructions:  Please increase your Lisinopril 20 mg. Start Metoprolol Succinate 50 mg once a day. Continue all other medications as listed.  *If you need a refill on your cardiac medications before your next appointment, please call your pharmacy*  Lab Work: Please have blood work today (Lipid, CMP, CBC and TSH)  If you have labs (blood work) drawn today and your tests are completely normal, you will receive your results only by: Marland Kitchen MyChart Message (if you have MyChart) OR . A paper copy in the mail If you have any lab test that is abnormal or we need to change your treatment, we will call you to review the results.  Testing/Procedures: Your physician has requested that you have an echocardiogram. Echocardiography is a painless test that uses sound waves to create images of your heart. It provides your doctor with information about the size and shape of your heart and how well your heart's chambers and valves are working. This procedure takes approximately one hour. There are no restrictions for this procedure.  Follow-Up: At [facility], you and your health needs are our priority.  As part of our continuing mission to provide you with exceptional heart care, we have created designated Provider Care Teams.  These Care Teams include your primary Cardiologist (physician) and Advanced Practice Providers (APPs -  Physician Assistants and Nurse Practitioners) who all work together to provide you with the care you need, when you need it.  We recommend signing up for the patient portal called "MyChart".  Sign up information is provided on this After Visit Summary.  MyChart is used to connect with patients for Virtual Visits (Telemedicine).  Patients are able to view lab/test results, encounter notes, upcoming appointments, etc.  Non-urgent messages can be sent to your provider as well.   To learn more about what you can do with MyChart, go to [email or web address].    Your next  appointment:   1 month(s)  The format for your next appointment:   In Person  Provider:   In Hypertension Clinic.   Thank you for choosing [facility]!!

## 2020-07-02 LAB — CBC
Hematocrit: 40.2 % (ref 34.0–46.6)
Hemoglobin: 13.3 g/dL (ref 11.1–15.9)
MCH: 29.3 pg (ref 26.6–33.0)
MCHC: 33.1 g/dL (ref 31.5–35.7)
MCV: 89 fL (ref 79–97)
Platelets: 251 10*3/uL (ref 150–450)
RBC: 4.54 x10E6/uL (ref 3.77–5.28)
RDW: 12.8 % (ref 11.7–15.4)
WBC: 8.1 10*3/uL (ref 3.4–10.8)

## 2020-07-02 LAB — COMPREHENSIVE METABOLIC PANEL
ALT: 27 IU/L (ref 0–32)
AST: 29 IU/L (ref 0–40)
Albumin/Globulin Ratio: 1.6 (ref 1.2–2.2)
Albumin: 4.1 g/dL (ref 3.7–4.7)
Alkaline Phosphatase: 81 IU/L (ref 44–121)
BUN/Creatinine Ratio: 26 (ref 12–28)
BUN: 21 mg/dL (ref 8–27)
Bilirubin Total: 0.4 mg/dL (ref 0.0–1.2)
CO2: 23 mmol/L (ref 20–29)
Calcium: 8.8 mg/dL (ref 8.7–10.3)
Chloride: 106 mmol/L (ref 96–106)
Creatinine, Ser: 0.81 mg/dL (ref 0.57–1.00)
GFR calc Af Amer: 80 mL/min/{1.73_m2} (ref >59)
GFR calc non Af Amer: 69 mL/min/{1.73_m2} (ref >59)
Globulin, Total: 2.5 g/dL (ref 1.5–4.5)
Glucose: 86 mg/dL (ref 65–99)
Potassium: 4.2 mmol/L (ref 3.5–5.2)
Sodium: 142 mmol/L (ref 134–144)
Total Protein: 6.6 g/dL (ref 6.0–8.5)

## 2020-07-02 LAB — TSH: TSH: 1.63 u[IU]/mL (ref 0.450–4.500)

## 2020-07-02 LAB — LIPID PANEL
Chol/HDL Ratio: 4 ratio (ref 0.0–4.4)
Cholesterol, Total: 153 mg/dL (ref 100–199)
HDL: 38 mg/dL — ABNORMAL LOW (ref >39)
LDL Chol Calc (NIH): 81 mg/dL (ref 0–99)
Triglycerides: 199 mg/dL — ABNORMAL HIGH (ref 0–149)
VLDL Cholesterol Cal: 34 mg/dL (ref 5–40)

## 2020-07-05 ENCOUNTER — Telehealth: Payer: Self-pay | Admitting: *Deleted

## 2020-07-05 ENCOUNTER — Encounter: Payer: Self-pay | Admitting: *Deleted

## 2020-07-05 DIAGNOSIS — E7800 Pure hypercholesterolemia, unspecified: Secondary | ICD-10-CM

## 2020-07-05 DIAGNOSIS — Z79899 Other long term (current) drug therapy: Secondary | ICD-10-CM

## 2020-07-05 MED ORDER — ATORVASTATIN CALCIUM 40 MG PO TABS
40.0000 mg | ORAL_TABLET | Freq: Every day | ORAL | 3 refills | Status: DC
Start: 2020-07-05 — End: 2020-10-28

## 2020-07-05 NOTE — Telephone Encounter (Signed)
Normal kidney function and liver function. No anemia. Normal thyroid.  LDL 81. Goal is less than 70.  Increase atorvastatin to 40 mg once a day and repeat lipid panel in 3 months.   [name], MD    Rx sent electronically to pharmacy, lipid order placed and scheduled in Epic.   Called and spoke with grand-daughter Linda [name]) on DPR.  Reviewed results and instructions.  Aware RX has been sent electronically into pt's pharmacy and lab scheduled 10/06/2020.   [name] states understanding and will call back if any questions or concerns. Copy of results mailed to pt's home address for her review.

## 2020-07-07 ENCOUNTER — Encounter (HOSPITAL_COMMUNITY): Payer: Self-pay | Admitting: Physician Assistant

## 2020-07-07 ENCOUNTER — Other Ambulatory Visit: Payer: Self-pay

## 2020-07-07 ENCOUNTER — Ambulatory Visit (HOSPITAL_COMMUNITY)
Admission: RE | Admit: 2020-07-07 | Discharge: 2020-07-07 | Disposition: A | Discharge status: Home / Self Care | Payer: Medicare HMO | Source: Ambulatory Visit | Attending: Physician Assistant | Admitting: Physician Assistant

## 2020-07-07 VITALS — BP 142/82 | HR 61 | Ht 60.0 in | Wt 119.0 lb

## 2020-07-07 DIAGNOSIS — Z79899 Other long term (current) drug therapy: Secondary | ICD-10-CM | POA: Diagnosis not present

## 2020-07-07 DIAGNOSIS — Z7901 Long term (current) use of anticoagulants: Secondary | ICD-10-CM | POA: Insufficient documentation

## 2020-07-07 DIAGNOSIS — I4819 Other persistent atrial fibrillation: Secondary | ICD-10-CM | POA: Insufficient documentation

## 2020-07-07 DIAGNOSIS — E119 Type 2 diabetes mellitus without complications: Secondary | ICD-10-CM | POA: Diagnosis not present

## 2020-07-07 DIAGNOSIS — Z8673 Personal history of transient ischemic attack (TIA), and cerebral infarction without residual deficits: Secondary | ICD-10-CM | POA: Diagnosis not present

## 2020-07-07 DIAGNOSIS — I480 Paroxysmal atrial fibrillation: Secondary | ICD-10-CM

## 2020-07-07 DIAGNOSIS — F17210 Nicotine dependence, cigarettes, uncomplicated: Secondary | ICD-10-CM | POA: Insufficient documentation

## 2020-07-07 DIAGNOSIS — Z7984 Long term (current) use of oral hypoglycemic drugs: Secondary | ICD-10-CM | POA: Insufficient documentation

## 2020-07-07 DIAGNOSIS — E785 Hyperlipidemia, unspecified: Secondary | ICD-10-CM | POA: Diagnosis not present

## 2020-07-07 DIAGNOSIS — I2510 Atherosclerotic heart disease of native coronary artery without angina pectoris: Secondary | ICD-10-CM | POA: Insufficient documentation

## 2020-07-07 DIAGNOSIS — D6869 Other thrombophilia: Secondary | ICD-10-CM | POA: Diagnosis not present

## 2020-07-07 DIAGNOSIS — I10 Essential (primary) hypertension: Secondary | ICD-10-CM | POA: Diagnosis not present

## 2020-07-07 NOTE — Progress Notes (Signed)
Primary Care Physician: [name], MD Primary Cardiologist: Dr [name] Primary Electrophysiologist: Dr Graciela Husbands (remotely) Referring Physician: Dr Pura Spice is a 79 y.o. female with a history of CAD, DM, prior CVA, HTN, HLD, tobacco abuse, and persistent atrial fibrillation who presents for follow up in the [facility].  The patient was initially diagnosed with atrial fibrillation remotely and had previously been on propafenone. She had followed with Dr Graciela Husbands but was lost to follow up in 2018. Patient is on Xarelto for a CHADS2VASC score of 8. She reports that over the last year she has had intermittent episodes of SOB and palpitations. This occurs 2-3 times in 2 weeks. The symptoms last ~1 minute.   Interpreter used today. On follow up today, patient reports she has done well. Zio patch showed 21% afib burden with elevated rates. Seen by Dr [name] on 07/01/20 and started on metoprolol. She reports that she has not had any episodes of heart racing or SOB since that visit. She denies bleeding issues on anticoagulation.   Today, she denies symptoms of palpitations, SOB, chest pain, orthopnea, PND, lower extremity edema, dizziness, presyncope, syncope, snoring, daytime somnolence, bleeding, or neurologic sequela. The patient is tolerating medications without difficulties and is otherwise without complaint today.    Atrial Fibrillation Risk Factors:  she does not have symptoms or diagnosis of sleep apnea. she does not have a history of rheumatic fever.   she has a BMI of Body mass index is 23.24 kg/m.Marland Kitchen Filed Weights   07/07/20 1016  Weight: 54 kg    Family History  Problem Relation Age of Onset  . Coronary artery disease Neg Hx      Atrial Fibrillation Management history:  Previous antiarrhythmic drugs: propafenone  Previous cardioversions: none Previous ablations: none CHADS2VASC score: 8 Anticoagulation history: warfarin,  Xarelto   Past Medical History:  Diagnosis Date  . Atrial fibrillation (HCC)    dx in setting of stroke 4/12;  echo with EF 60-65%, mild LVH, mild AI, grade 1 diast dysfxn  . Coronary artery disease   . Diabetes mellitus without complication (HCC)   . History of CVA (cerebrovascular accident)    New diagnosis December 08, 2010  . Hx of cardiovascular stress test    a. Myoview 8/12:  EF 69%, no ischemia.   . Hyperlipidemia   . Hypertension   . Impaired glucose tolerance 05/08/2011  . Stroke [facility]) 2012   no deficits  . Tobacco abuse    Ongoing (3-6 cigarettes per day, 25 pack year history)   Past Surgical History:  Procedure Laterality Date  . COLONOSCOPY N/A 02/01/2016   Procedure: COLONOSCOPY;  Surgeon: Ruffin Frederick, MD;  Location: [facility] ENDOSCOPY;  Service: Gastroenterology;  Laterality: N/A;    Current Outpatient Medications  Medication Sig Dispense Refill  . atorvastatin (LIPITOR) 40 MG tablet Take 1 tablet (40 mg total) by mouth daily. 90 tablet 3  . lisinopril (ZESTRIL) 20 MG tablet Take 1 tablet (20 mg total) by mouth daily. 90 tablet 3  . metFORMIN (GLUCOPHAGE) 500 MG tablet Take 500 mg by mouth 2 (two) times daily with a meal.    . metoprolol succinate (TOPROL-XL) 50 MG 24 hr tablet Take 1 tablet (50 mg total) by mouth daily. Take with or immediately following a meal. 90 tablet 3  . mirtazapine (REMERON) 15 MG tablet     . XARELTO 20 MG TABS tablet TAKE 1 TABLET(20 MG) BY MOUTH DAILY  WITH SUPPER 30 tablet 3   No current facility-administered medications for this encounter.    No Known Allergies  Social History   Socioeconomic History  . Marital status: Widowed    Spouse name: Not on file  . Number of children: Not on file  . Years of education: Not on file  . Highest education level: Not on file  Occupational History    Employer: [employer]  . Occupation: Not working  Tobacco Use  . Smoking status: Light Tobacco Smoker    Packs/day: 0.25    Years:  25.00    Pack years: 6.25    Types: Cigarettes  . Smokeless tobacco: Never Used  . Tobacco comment: Down to 1 cigarette per day.   Substance and Sexual Activity  . Alcohol use: No  . Drug use: No  . Sexual activity: Not on file  Other Topics Concern  . Not on file  Social History Narrative   Lives in [place] with her daughters   Regular diet   No regular exercise but active and independent with all daily activities   Social Determinants of Health   Financial Resource Strain:   . Difficulty of Paying Living Expenses: Not on file  Food Insecurity:   . Worried About Programme researcher, broadcasting/film/video in the Last Year: Not on file  . Ran Out of Food in the Last Year: Not on file  Transportation Needs:   . Lack of Transportation (Medical): Not on file  . Lack of Transportation (Non-Medical): Not on file  Physical Activity:   . Days of Exercise per Week: Not on file  . Minutes of Exercise per Session: Not on file  Stress:   . Feeling of Stress : Not on file  Social Connections:   . Frequency of Communication with Friends and Family: Not on file  . Frequency of Social Gatherings with Friends and Family: Not on file  . Attends Religious Services: Not on file  . Active Member of Clubs or Organizations: Not on file  . Attends Banker Meetings: Not on file  . Marital Status: Not on file  Intimate Partner Violence:   . Fear of Current or Ex-Partner: Not on file  . Emotionally Abused: Not on file  . Physically Abused: Not on file  . Sexually Abused: Not on file     ROS- All systems are reviewed and negative except as per the HPI above.  Physical Exam: Vitals:   07/07/20 1016  BP: (!) 142/82  Pulse: 61  Weight: 54 kg  Height: 5' (1.524 m)    GEN- The patient is well appearing elderly female, alert and oriented x 3 today.   HEENT-head normocephalic, atraumatic, sclera clear, conjunctiva pink, hearing intact, trachea midline. Lungs- Clear to ausculation bilaterally, normal  work of breathing Heart- Regular rate and rhythm, no murmurs, rubs or gallops  GI- soft, NT, ND, + BS Extremities- no clubbing, cyanosis, or edema MS- no significant deformity or atrophy Skin- no rash or lesion Psych- euthymic mood, full affect Neuro- strength and sensation are intact   Wt Readings from Last 3 Encounters:  07/07/20 54 kg  07/01/20 54.9 kg  06/02/20 54.4 kg    EKG today demonstrates SR HR 61, PR 166, QRS 88, QTc 479  Echo 04/03/18 demonstrated  EF 55-60%, mild LVH, mild AI, normal LA, no valvular issues.  Epic records are reviewed at length today  CHA2DS2-VASc Score = 8  The patient's score is based upon: CHF History: 0  HTN History: 1 Diabetes History: 1 Stroke History: 2 Vascular Disease History: 1 Age Score: 2 Gender Score: 1      ASSESSMENT AND PLAN: 1. Persistent Atrial Fibrillation (ICD10:  I48.19) The patient's CHA2DS2-VASc score is 8, indicating a 10.8% annual risk of stroke.   Zio patch showed 21% afib burden with avg rate of 134.  Patient has had no palpitations or SOB since starting BB. We briefly discussed AAD today if her palpitations return.  Continue Toprol 50 mg daily Continue Xarelto 20 mg daily. Repeat echocardiogram scheduled.   2. Secondary Hypercoagulable State (ICD10:  D68.69) The patient is at significant risk for stroke/thromboembolism based upon her CHA2DS2-VASc Score of 8.  Continue Rivaroxaban (Xarelto).   3. CAD No anginal symptoms.   4. HTN Stable today, patient has been referred to HTN clinic.    Follow up in the AF clinic in 3 months.    Jorja Loa PA-C Afib Clinic [facility] [address] [place], [place] [address] [phone] 07/07/2020 10:36 AM

## 2020-08-02 ENCOUNTER — Ambulatory Visit (HOSPITAL_COMMUNITY): Payer: Medicare HMO | Attending: Cardiology

## 2020-08-02 ENCOUNTER — Ambulatory Visit (INDEPENDENT_AMBULATORY_CARE_PROVIDER_SITE_OTHER): Payer: Medicare HMO | Admitting: Pharmacy Technician

## 2020-08-02 ENCOUNTER — Other Ambulatory Visit: Payer: Self-pay

## 2020-08-02 VITALS — BP 160/78 | HR 102

## 2020-08-02 DIAGNOSIS — I10 Essential (primary) hypertension: Secondary | ICD-10-CM | POA: Diagnosis not present

## 2020-08-02 DIAGNOSIS — I4819 Other persistent atrial fibrillation: Secondary | ICD-10-CM | POA: Insufficient documentation

## 2020-08-02 LAB — BASIC METABOLIC PANEL
BUN/Creatinine Ratio: 12 (ref 12–28)
BUN: 12 mg/dL (ref 8–27)
CO2: 20 mmol/L (ref 20–29)
Calcium: 9.5 mg/dL (ref 8.7–10.3)
Chloride: 105 mmol/L (ref 96–106)
Creatinine, Ser: 1.01 mg/dL — ABNORMAL HIGH (ref 0.57–1.00)
GFR calc Af Amer: 61 mL/min/{1.73_m2} (ref >59)
GFR calc non Af Amer: 53 mL/min/{1.73_m2} — ABNORMAL LOW (ref >59)
Glucose: 106 mg/dL — ABNORMAL HIGH (ref 65–99)
Potassium: 4.3 mmol/L (ref 3.5–5.2)
Sodium: 141 mmol/L (ref 134–144)

## 2020-08-02 LAB — ECHOCARDIOGRAM COMPLETE
P 1/2 time: 566 msec
S' Lateral: 2.75 cm

## 2020-08-02 MED ORDER — CARVEDILOL 12.5 MG PO TABS: 12.5000 mg | ORAL_TABLET | Freq: Two times a day (BID) | ORAL | 11 refills | Status: DC

## 2020-08-02 NOTE — Progress Notes (Signed)
Patient ID: Linda [name]                 DOB: 1941-01-16                      MRN: [id number]     HPI: Linda [name] is a 79 y.o. female referred by [name] to HTN clinic. PMH is significant for CAD, DM, prior CVA, HTN, HLD, tobacco abuse, persistent atrial fibrillation on Xarelto (CHADSVASC score 8). Patient presents for her initial visit to the hypertension clinic.  Patient was seen by [name] on 07/01/2020 for palpitations 2-3 times per week with a blood pressure reading of 170/80. She was prescribed metoprolol succinate 50mg  daily and lisinopril was increased from 5 mg daily to 20 mg daily. At a follow up visit with 07/07/2020, the Zio patch showed a 21% afib burden but the patient denied palpitations with the addition of metoprolol. Her BP had improved to 142/82 at that visit.  At today's visit, patient endorsed experiencing palpitations every 7-10 days for no longer than 15 seconds at a time and expressed that they are much improved since starting metoprolol. Patient denied any side effects from her recent medication changes including dizziness or lightheadedness. Patient endorses a healthy lifestyle including biking in her home several times a week and does not cook with salt. Patient does not take her blood pressure at home. Patient does not recall taking amlodipine in the past but noted she has never had an adverse effect from a medication.   Current HTN meds: lisinopril 20 mg daily, metoprolol succinate 50 mg daily  Previously tried: amlodipine (stopped 2017 at hospital discharge for lower blood pressure, had no issues tolerating)   BP goal: <130/80 mmHg  Family History: negative for CAD  Social History: current smoker, 6.25 pack-year history  Diet:   Breakfast: coffee with cream or milk Lunch/dinner: rice with fish or meat and vegetables; does not use salt but does use fish sauce Snack: none  Drinks: water   Exercise: bike in house 2-3 days per week for 10  minutes  Home BP readings: none  Wt Readings from Last 3 Encounters:  07/07/20 119 lb (54 kg)  07/01/20 121 lb (54.9 kg)  06/02/20 120 lb (54.4 kg)   BP Readings from Last 3 Encounters:  07/07/20 (!) 142/82  07/01/20 (!) 170/80  06/02/20 (!) 142/80   Pulse Readings from Last 3 Encounters:  07/07/20 61  07/01/20 68  06/02/20 66    Renal function: CrCl cannot be calculated (Patient's most recent lab result is older than the maximum 21 days allowed.).  Past Medical History:  Diagnosis Date  . Atrial fibrillation (HCC)    dx in setting of stroke 4/12;  echo with EF 60-65%, mild LVH, mild AI, grade 1 diast dysfxn  . Coronary artery disease   . Diabetes mellitus without complication (HCC)   . History of CVA (cerebrovascular accident)    New diagnosis December 08, 2010  . Hx of cardiovascular stress test    a. Myoview 8/12:  EF 69%, no ischemia.   . Hyperlipidemia   . Hypertension   . Impaired glucose tolerance 05/08/2011  . Stroke [facility]) 2012   no deficits  . Tobacco abuse    Ongoing (3-6 cigarettes per day, 25 pack year history)    Current Outpatient Medications on File Prior to Visit  Medication Sig Dispense Refill  . atorvastatin (LIPITOR) 40 MG tablet Take 1 tablet (  40 mg total) by mouth daily. 90 tablet 3  . lisinopril (ZESTRIL) 20 MG tablet Take 1 tablet (20 mg total) by mouth daily. 90 tablet 3  . metFORMIN (GLUCOPHAGE) 500 MG tablet Take 500 mg by mouth 2 (two) times daily with a meal.    . metoprolol succinate (TOPROL-XL) 50 MG 24 hr tablet Take 1 tablet (50 mg total) by mouth daily. Take with or immediately following a meal. 90 tablet 3  . mirtazapine (REMERON) 15 MG tablet     . XARELTO 20 MG TABS tablet TAKE 1 TABLET(20 MG) BY MOUTH DAILY WITH SUPPER 30 tablet 3   No current facility-administered medications on file prior to visit.    No Known Allergies   Assessment/Plan:  1. Hypertension - Patient's blood pressure is elevated above goal of <130/80 mmHg and  heart rate is slightly elevated at 102 bpm with patient still experiencing palpitations every 7-10 days. For added blood pressure lowering effects, will stop metoprolol and start higher dose of carvedilol 12.5 mg twice daily for more effective BP lowering. Checking BMET to assess patient's recent change in lisinopril dose. If results are normal, will follow up with patient over the phone to increase lisinopril dose to 40 mg daily. Patient was encouraged to continue exercising and limit her salt intake. Patient will follow up in clinic in 3 weeks.  Kinnie Feil, PharmD PGY1 Acute Care Pharmacy Resident 08/02/2020 10:35 AM

## 2020-08-02 NOTE — Patient Instructions (Addendum)
Th?t vui khi ???c g?p b?n hm nay!  Hm nay huy?t p c?a b?n t?ng cao h?n m?c tiu c?a b?n l <130/80. Vui lng ng?ng dng metoprolol v b?t ??u dng carvedilol 12,5 mg x 2 l?n / ngy. ?i?u ny s? gip ch cho nh?p tim v huy?t p c?a b?n.  Chng ti s? g?i cho b?n v?i k?t qu? phng th nghi?m c?a b?n vo ngy mai. N?u bnh th??ng, chng ti s? yu c?u b?n t?ng li?u lisinopril ln 40 mg m?i ngy. Khng t?ng li?u c?a b?n cho ??n khi chng ti g?i cho b?n ?? bi?t k?t qu? c?a b?n.  Cn nh?c c?t gi?m l??ng mu?i trong ch? ?? ?n u?ng v t?ng c??ng t?p th? d?c.  Chng ti s? g?p l?i b?n sau m?t vi tu?n.

## 2020-08-03 ENCOUNTER — Telehealth: Payer: Self-pay | Admitting: Pharmacist

## 2020-08-03 NOTE — Telephone Encounter (Signed)
Called pt's granddaughter Linda [name] (English-speaking) and left message to discuss lab results.   BMET stable since increasing lisinopril from 5mg  to 20mg  daily. SCr did increase slightly from 0.8 to 1 but is consistent with prior SCr on file, may be transient increase as well due to dose change. Will plan to increase lisinopril to 40mg  daily due to elevated BP at visit yesterday. Follow up is already scheduled in 3 weeks and will recheck BMET at that time.

## 2020-08-05 NOTE — Telephone Encounter (Deleted)
Tried calling pt's daughter and granddaughter, no answer for either of them.

## 2020-08-06 NOTE — Telephone Encounter (Signed)
Called interpreter line, spoke with interpreter [place], [place] #[id number]. She called pt who did not pick up, voicemail was left.

## 2020-08-06 NOTE — Telephone Encounter (Deleted)
Called daughter again, no answer. Will address at next visit in person with pt and interpreter.

## 2020-08-23 ENCOUNTER — Ambulatory Visit: Payer: Medicare HMO

## 2020-10-05 NOTE — Progress Notes (Signed)
Primary Care Physician: [name], MD Primary Cardiologist: Dr [name] Primary Electrophysiologist: Dr Graciela Husbands (remotely) Referring Physician: Dr Pura Spice is a 80 y.o. female with a history of CAD, DM, prior CVA, HTN, HLD, tobacco abuse, and persistent atrial fibrillation who presents for follow up in the [facility].  The patient was initially diagnosed with atrial fibrillation remotely and had previously been on propafenone. She had followed with Dr Graciela Husbands but was lost to follow up in 2018. Patient is on Xarelto for a CHADS2VASC score of 8. She reports that over the last year she has had intermittent episodes of SOB and palpitations. This occurs 2-3 times in 2 weeks. The symptoms last ~1 minute. Zio patch showed 21% afib burden with elevated rates. Seen by Dr [name] on 07/01/20 and started on metoprolol.   On follow up today, patient hospitalized at [facility] 09/21/20 with hypertensive emergency, flash pulmonary edema, and rapid afib. She presented with chest pressure and shortness of breath. BP noted by EMS to be in the 240s systolic. Patient arrived to ED and placed on BiPAP with improvement in symptoms. CXR noted pulmonary edema EKG noted afib with RVR and a rate of 130-150. Patient with RVR, initially trialed on amio bolus/gtt but switched to dilt gtt when amio did not cause any effect. dilt led to good rate control and patient transitioned to PO cardizem. Cardiology consulted as well and consolidated to 120 mg QD cardizem, patient became tachycardic. Increased cardizem to 180 mg QD. Patient was given labetalol, lasix, and cardizem to treat patient which resulted in drop in BP. BP medications were adjusted and increased lisinopril to 40 mg QD, increased cardizem to 180 mg QD and HCTZ 12.5 mg QD.   She remains in rate controlled afib today. She does feel tired and has palpitations.   Today, she denies symptoms of SOB, chest pain, orthopnea, PND, lower  extremity edema, dizziness, presyncope, syncope, snoring, daytime somnolence, bleeding, or neurologic sequela. The patient is tolerating medications without difficulties and is otherwise without complaint today.    Atrial Fibrillation Risk Factors:  she does not have symptoms or diagnosis of sleep apnea. she does not have a history of rheumatic fever.   she has a BMI of Body mass index is 22.62 kg/m.Marland Kitchen Filed Weights   10/06/20 1046  Weight: 52.5 kg    Family History  Problem Relation Age of Onset  . Coronary artery disease Neg Hx      Atrial Fibrillation Management history:  Previous antiarrhythmic drugs: propafenone  Previous cardioversions: none Previous ablations: none CHADS2VASC score: 8 Anticoagulation history: warfarin, Xarelto   Past Medical History:  Diagnosis Date  . Atrial fibrillation (HCC)    dx in setting of stroke 4/12;  echo with EF 60-65%, mild LVH, mild AI, grade 1 diast dysfxn  . Coronary artery disease   . Diabetes mellitus without complication (HCC)   . History of CVA (cerebrovascular accident)    New diagnosis December 08, 2010  . Hx of cardiovascular stress test    a. Myoview 8/12:  EF 69%, no ischemia.   . Hyperlipidemia   . Hypertension   . Impaired glucose tolerance 05/08/2011  . Stroke [facility]) 2012   no deficits  . Tobacco abuse    Ongoing (3-6 cigarettes per day, 25 pack year history)   Past Surgical History:  Procedure Laterality Date  . COLONOSCOPY N/A 02/01/2016   Procedure: COLONOSCOPY;  Surgeon: Ruffin Frederick, MD;  Location: [facility] ENDOSCOPY;  Service: Gastroenterology;  Laterality: N/A;    Current Outpatient Medications  Medication Sig Dispense Refill  . amiodarone (PACERONE) 200 MG tablet Take 1 tablet (200 mg total) by mouth 2 (two) times daily. 60 tablet 0  . atorvastatin (LIPITOR) 40 MG tablet Take 1 tablet (40 mg total) by mouth daily. 90 tablet 3  . diltiazem (CARDIZEM CD) 180 MG 24 hr capsule Take 180 mg by mouth daily.     . hydrochlorothiazide (MICROZIDE) 12.5 MG capsule Take 12.5 mg by mouth daily.    Marland Kitchen lisinopril (ZESTRIL) 40 MG tablet     . mirtazapine (REMERON) 15 MG tablet Take 15 mg by mouth at bedtime.    . Multiple Vitamins-Minerals (ONE-A-DAY WOMENS VITACRAVES) CHEW Chew by mouth.    Carlena Hurl 20 MG TABS tablet TAKE 1 TABLET(20 MG) BY MOUTH DAILY WITH SUPPER 30 tablet 3   No current facility-administered medications for this encounter.    No Known Allergies  Social History   Socioeconomic History  . Marital status: Widowed    Spouse name: Not on file  . Number of children: Not on file  . Years of education: Not on file  . Highest education level: Not on file  Occupational History    Employer: [employer]  . Occupation: Not working  Tobacco Use  . Smoking status: Light Tobacco Smoker    Packs/day: 0.25    Years: 25.00    Pack years: 6.25    Types: Cigarettes  . Smokeless tobacco: Never Used  . Tobacco comment: Down to 1 cigarette per day.   Substance and Sexual Activity  . Alcohol use: No  . Drug use: No  . Sexual activity: Not on file  Other Topics Concern  . Not on file  Social History Narrative   Lives in [place] with her daughters   Regular diet   No regular exercise but active and independent with all daily activities   Social Determinants of Health   Financial Resource Strain: Not on file  Food Insecurity: Not on file  Transportation Needs: Not on file  Physical Activity: Not on file  Stress: Not on file  Social Connections: Not on file  Intimate Partner Violence: Not on file     ROS- All systems are reviewed and negative except as per the HPI above.  Physical Exam: Vitals:   10/06/20 1046  BP: 116/72  Pulse: 75  Weight: 52.5 kg  Height: 5' (1.524 m)    GEN- The patient is well appearing elderly female, alert and oriented x 3 today.   HEENT-head normocephalic, atraumatic, sclera clear, conjunctiva pink, hearing intact, trachea midline. Lungs- Clear to  ausculation bilaterally, normal work of breathing Heart- irregular rate and rhythm, no murmurs, rubs or gallops  GI- soft, NT, ND, + BS Extremities- no clubbing, cyanosis, or edema MS- no significant deformity or atrophy Skin- no rash or lesion Psych- euthymic mood, full affect Neuro- strength and sensation are intact   Wt Readings from Last 3 Encounters:  10/06/20 52.5 kg  07/07/20 54 kg  07/01/20 54.9 kg    EKG today demonstrates  Afib  Vent. rate 75 BPM QRS duration 86 ms QT/QTc 410/457 ms  Echo 08/02/20 demonstrated  1. Left ventricular ejection fraction, by estimation, is 60 to 65%. The  left ventricle has normal function. The left ventricle has no regional  wall motion abnormalities. There is mild left ventricular hypertrophy.  Left ventricular diastolic parameters  are consistent with Grade I diastolic  dysfunction (impaired relaxation).  2. Right ventricular systolic function is normal. The right ventricular  size is normal.  3. The mitral valve is normal in structure. Trivial mitral valve  regurgitation. No evidence of mitral stenosis.  4. The aortic valve is tricuspid. Aortic valve regurgitation is moderate.  No aortic stenosis is present. Aortic regurgitation PHT measures 566 msec.  5. Aortic dilatation noted. There is mild dilatation of the ascending  aorta, measuring 39 mm.  6. The inferior vena cava is normal in size with greater than 50%  respiratory variability, suggesting right atrial pressure of 3 mmHg.   Epic records are reviewed at length today  CHA2DS2-VASc Score = 8  The patient's score is based upon: CHF History: No HTN History: Yes Diabetes History: Yes Stroke History: Yes Vascular Disease History: Yes Age Score: 2 Gender Score: 1      ASSESSMENT AND PLAN: 1. Persistent Atrial Fibrillation (ICD10:  I48.19) The patient's CHA2DS2-VASc score is 8, indicating a 10.8% annual risk of stroke.   Patient admitted at [facility] with hypertensive  emergency and rapid afib. She remains in rate controlled afib today. We discussed therapeutic options including AAD. Would avoid class IC with vascular disease and QT in SR is too long for sotalol or dofetilide. Will start amiodarone 200 mg BID for 2 weeks and then decrease to 200 mg daily. If she remains persistent, will consider DCCV. Continue diltiazem 180 mg daily Continue Xarelto 20 mg daily.  2. Secondary Hypercoagulable State (ICD10:  D68.69) The patient is at significant risk for stroke/thromboembolism based upon her CHA2DS2-VASc Score of 8.  Continue Rivaroxaban (Xarelto).   3. CAD No anginal symptoms.  4. HTN Stable, no changes today.   Follow up in the AF clinic in 2 weeks.    Jorja Loa PA-C Afib [facility] [address] [place], [place] [address] [phone] 10/06/2020 11:09 AM

## 2020-10-06 ENCOUNTER — Ambulatory Visit (HOSPITAL_COMMUNITY)
Admission: RE | Admit: 2020-10-06 | Discharge: 2020-10-06 | Disposition: A | Discharge status: Home / Self Care | Payer: Medicare HMO | Source: Ambulatory Visit | Attending: Physician Assistant | Admitting: Physician Assistant

## 2020-10-06 ENCOUNTER — Other Ambulatory Visit: Payer: Medicare HMO

## 2020-10-06 ENCOUNTER — Encounter (HOSPITAL_COMMUNITY): Payer: Self-pay | Admitting: Physician Assistant

## 2020-10-06 ENCOUNTER — Other Ambulatory Visit: Payer: Self-pay

## 2020-10-06 VITALS — BP 116/72 | HR 75 | Ht 60.0 in | Wt 115.8 lb

## 2020-10-06 DIAGNOSIS — F17210 Nicotine dependence, cigarettes, uncomplicated: Secondary | ICD-10-CM | POA: Insufficient documentation

## 2020-10-06 DIAGNOSIS — I2510 Atherosclerotic heart disease of native coronary artery without angina pectoris: Secondary | ICD-10-CM | POA: Diagnosis not present

## 2020-10-06 DIAGNOSIS — Z79899 Other long term (current) drug therapy: Secondary | ICD-10-CM | POA: Diagnosis not present

## 2020-10-06 DIAGNOSIS — Z8673 Personal history of transient ischemic attack (TIA), and cerebral infarction without residual deficits: Secondary | ICD-10-CM | POA: Insufficient documentation

## 2020-10-06 DIAGNOSIS — I10 Essential (primary) hypertension: Secondary | ICD-10-CM | POA: Insufficient documentation

## 2020-10-06 DIAGNOSIS — Z7901 Long term (current) use of anticoagulants: Secondary | ICD-10-CM | POA: Diagnosis not present

## 2020-10-06 DIAGNOSIS — I4819 Other persistent atrial fibrillation: Secondary | ICD-10-CM | POA: Diagnosis present

## 2020-10-06 DIAGNOSIS — E119 Type 2 diabetes mellitus without complications: Secondary | ICD-10-CM | POA: Diagnosis not present

## 2020-10-06 DIAGNOSIS — D6869 Other thrombophilia: Secondary | ICD-10-CM | POA: Diagnosis not present

## 2020-10-06 MED ORDER — AMIODARONE HCL 200 MG PO TABS: 200.0000 mg | ORAL_TABLET | Freq: Two times a day (BID) | ORAL | 0 refills | Status: DC

## 2020-10-06 NOTE — Patient Instructions (Signed)
Amiodarone 200mg  twice a day (1 tablet twice a day).  Take with food

## 2020-10-20 ENCOUNTER — Ambulatory Visit (HOSPITAL_COMMUNITY): Payer: Medicare HMO | Admitting: Physician Assistant

## 2020-10-21 NOTE — Progress Notes (Signed)
Primary Care Physician: [name], MD Primary Cardiologist: Dr [name] Primary Electrophysiologist: Dr Graciela Husbands (remotely) Referring Physician: Dr Pura Spice is a 80 y.o. female with a history of CAD, DM, prior CVA, HTN, HLD, tobacco abuse, and persistent atrial fibrillation who presents for follow up in the [facility].  The patient was initially diagnosed with atrial fibrillation remotely and had previously been on propafenone. She had followed with Dr Graciela Husbands but was lost to follow up in 2018. Patient is on Xarelto for a CHADS2VASC score of 8. She reports that over the last year she has had intermittent episodes of SOB and palpitations. This occurs 2-3 times in 2 weeks. The symptoms last ~1 minute. Zio patch showed 21% afib burden with elevated rates. Seen by Dr [name] on 07/01/20 and started on metoprolol.   Patient hospitalized at [facility] 09/21/20 with hypertensive emergency, flash pulmonary edema, and rapid afib. She presented with chest pressure and shortness of breath. BP noted by EMS to be in the 240s systolic. Patient arrived to ED and placed on BiPAP with improvement in symptoms. CXR noted pulmonary edema EKG noted afib with RVR and a rate of 130-150. Patient with RVR, initially trialed on amio bolus/gtt but switched to dilt gtt when amio did not cause any effect. dilt led to good rate control and patient transitioned to PO cardizem. Cardiology consulted as well and consolidated to 120 mg QD cardizem, patient became tachycardic. Increased cardizem to 180 mg QD. Patient was given labetalol, lasix, and cardizem to treat patient which resulted in drop in BP. BP medications were adjusted and increased lisinopril to 40 mg QD, increased cardizem to 180 mg QD and HCTZ 12.5 mg QD.   Interpreter utilized for visit. On follow up today, patient reports that she has done reasonably well since the last visit. No issues with heart racing. She is in atrial flutter  today. She denies any bleeding issues on anticoagulation.   Today, she denies symptoms of palpitations, SOB, chest pain, orthopnea, PND, lower extremity edema, dizziness, presyncope, syncope, snoring, daytime somnolence, bleeding, or neurologic sequela. The patient is tolerating medications without difficulties and is otherwise without complaint today.    Atrial Fibrillation Risk Factors:  she does not have symptoms or diagnosis of sleep apnea. she does not have a history of rheumatic fever.   she has a BMI of Body mass index is 23.01 kg/m.Marland Kitchen Filed Weights   10/22/20 1006  Weight: 53.4 kg    Family History  Problem Relation Age of Onset  . Coronary artery disease Neg Hx      Atrial Fibrillation Management history:  Previous antiarrhythmic drugs: propafenone, amiodarone  Previous cardioversions: none Previous ablations: none CHADS2VASC score: 8 Anticoagulation history: warfarin, Xarelto   Past Medical History:  Diagnosis Date  . Atrial fibrillation (HCC)    dx in setting of stroke 4/12;  echo with EF 60-65%, mild LVH, mild AI, grade 1 diast dysfxn  . Coronary artery disease   . Diabetes mellitus without complication (HCC)   . History of CVA (cerebrovascular accident)    New diagnosis December 08, 2010  . Hx of cardiovascular stress test    a. Myoview 8/12:  EF 69%, no ischemia.   . Hyperlipidemia   . Hypertension   . Impaired glucose tolerance 05/08/2011  . Stroke [facility]) 2012   no deficits  . Tobacco abuse    Ongoing (3-6 cigarettes per day, 25 pack year history)   Past  Surgical History:  Procedure Laterality Date  . COLONOSCOPY N/A 02/01/2016   Procedure: COLONOSCOPY;  Surgeon: Ruffin Frederick, MD;  Location: [facility] ENDOSCOPY;  Service: Gastroenterology;  Laterality: N/A;    Current Outpatient Medications  Medication Sig Dispense Refill  . atorvastatin (LIPITOR) 40 MG tablet Take 1 tablet (40 mg total) by mouth daily. 90 tablet 3  . diltiazem (CARDIZEM CD) 180  MG 24 hr capsule Take 180 mg by mouth daily.    . hydrochlorothiazide (MICROZIDE) 12.5 MG capsule Take 12.5 mg by mouth daily.    Marland Kitchen lisinopril (ZESTRIL) 40 MG tablet     . mirtazapine (REMERON) 15 MG tablet Take 15 mg by mouth at bedtime.    . Multiple Vitamins-Minerals (ONE-A-DAY WOMENS VITACRAVES) CHEW Chew by mouth.    Carlena Hurl 20 MG TABS tablet TAKE 1 TABLET(20 MG) BY MOUTH DAILY WITH SUPPER 30 tablet 3  . amiodarone (PACERONE) 200 MG tablet Take 1 tablet (200 mg total) by mouth daily. 30 tablet 3   No current facility-administered medications for this encounter.    No Known Allergies  Social History   Socioeconomic History  . Marital status: Widowed    Spouse name: Not on file  . Number of children: Not on file  . Years of education: Not on file  . Highest education level: Not on file  Occupational History    Employer: [employer]  . Occupation: Not working  Tobacco Use  . Smoking status: Light Tobacco Smoker    Packs/day: 0.25    Years: 25.00    Pack years: 6.25    Types: Cigarettes  . Smokeless tobacco: Never Used  . Tobacco comment: Down to 1 cigarette per day.   Substance and Sexual Activity  . Alcohol use: No  . Drug use: No  . Sexual activity: Not on file  Other Topics Concern  . Not on file  Social History Narrative   Lives in [place] with her daughters   Regular diet   No regular exercise but active and independent with all daily activities   Social Determinants of Health   Financial Resource Strain: Not on file  Food Insecurity: Not on file  Transportation Needs: Not on file  Physical Activity: Not on file  Stress: Not on file  Social Connections: Not on file  Intimate Partner Violence: Not on file     ROS- All systems are reviewed and negative except as per the HPI above.  Physical Exam: Vitals:   10/22/20 1006  BP: (!) 142/82  Pulse: 62  Weight: 53.4 kg  Height: 5' (1.524 m)    GEN- The patient is a well appearing elderly female,  alert and oriented x 3 today.   HEENT-head normocephalic, atraumatic, sclera clear, conjunctiva pink, hearing intact, trachea midline. Lungs- Clear to ausculation bilaterally, normal work of breathing Heart- irregular rate and rhythm, no murmurs, rubs or gallops  GI- soft, NT, ND, + BS Extremities- no clubbing, cyanosis, or edema MS- no significant deformity or atrophy Skin- no rash or lesion Psych- euthymic mood, full affect Neuro- strength and sensation are intact   Wt Readings from Last 3 Encounters:  10/22/20 53.4 kg  10/06/20 52.5 kg  07/07/20 54 kg    EKG today demonstrates  Atrial flutter with variable block Vent. rate 62 BPM PR interval * ms QRS duration 92 ms QT/QTc 510/517 ms  Echo 08/02/20 demonstrated  1. Left ventricular ejection fraction, by estimation, is 60 to 65%. The  left ventricle has normal function.  The left ventricle has no regional  wall motion abnormalities. There is mild left ventricular hypertrophy.  Left ventricular diastolic parameters  are consistent with Grade I diastolic dysfunction (impaired relaxation).  2. Right ventricular systolic function is normal. The right ventricular  size is normal.  3. The mitral valve is normal in structure. Trivial mitral valve  regurgitation. No evidence of mitral stenosis.  4. The aortic valve is tricuspid. Aortic valve regurgitation is moderate.  No aortic stenosis is present. Aortic regurgitation PHT measures 566 msec.  5. Aortic dilatation noted. There is mild dilatation of the ascending  aorta, measuring 39 mm.  6. The inferior vena cava is normal in size with greater than 50%  respiratory variability, suggesting right atrial pressure of 3 mmHg.   Epic records are reviewed at length today  CHA2DS2-VASc Score = 8  The patient's score is based upon: CHF History: No HTN History: Yes Diabetes History: Yes Stroke History: Yes Vascular Disease History: Yes Age Score: 2 Gender Score: 1       ASSESSMENT AND PLAN: 1. Persistent Atrial Fibrillation/atrial flutter The patient's CHA2DS2-VASc score is 8, indicating a 10.8% annual risk of stroke.   Patient in rate controlled atrial flutter today. Will plan for DCCV. Check bmet/CBC Decrease amiodarone to 200 mg daily Continue diltiazem 180 mg daily Continue Xarelto 20 mg daily. Patient denies any missed doses in the last 3 weeks.   2. Secondary Hypercoagulable State (ICD10:  D68.69) The patient is at significant risk for stroke/thromboembolism based upon her CHA2DS2-VASc Score of 8.  Continue Rivaroxaban (Xarelto).   3. CAD No anginal symptoms.  4. HTN Stable, no changes today.   Follow up in the AF clinic post DCCV.   Jorja Loa PA-C [facility] [address] [place], [place] [address] [phone] 10/22/2020 10:28 AM

## 2020-10-21 NOTE — H&P (View-Only) (Signed)
Primary Care Physician: [name], MD Primary Cardiologist: Dr [name] Primary Electrophysiologist: Dr Graciela Husbands (remotely) Referring Physician: Dr Linda [name] is a 80 y.o. female with a history of CAD, DM, prior CVA, HTN, HLD, tobacco abuse, and persistent atrial fibrillation who presents for follow up in the [facility].  The patient was initially diagnosed with atrial fibrillation remotely and had previously been on propafenone. She had followed with Dr Graciela Husbands but was lost to follow up in 2018. Patient is on Xarelto for a CHADS2VASC score of 8. She reports that over the last year she has had intermittent episodes of SOB and palpitations. This occurs 2-3 times in 2 weeks. The symptoms last ~1 minute. Zio patch showed 21% afib burden with elevated rates. Seen by Dr [name] on 07/01/20 and started on metoprolol.   Patient hospitalized at [facility] 09/21/20 with hypertensive emergency, flash pulmonary edema, and rapid afib. She presented with chest pressure and shortness of breath. BP noted by EMS to be in the 240s systolic. Patient arrived to ED and placed on BiPAP with improvement in symptoms. CXR noted pulmonary edema EKG noted afib with RVR and a rate of 130-150. Patient with RVR, initially trialed on amio bolus/gtt but switched to dilt gtt when amio did not cause any effect. dilt led to good rate control and patient transitioned to PO cardizem. Cardiology consulted as well and consolidated to 120 mg QD cardizem, patient became tachycardic. Increased cardizem to 180 mg QD. Patient was given labetalol, lasix, and cardizem to treat patient which resulted in drop in BP. BP medications were adjusted and increased lisinopril to 40 mg QD, increased cardizem to 180 mg QD and HCTZ 12.5 mg QD.   Interpreter utilized for visit. On follow up today, patient reports that she has done reasonably well since the last visit. No issues with heart racing. She is in atrial flutter  today. She denies any bleeding issues on anticoagulation.   Today, she denies symptoms of palpitations, SOB, chest pain, orthopnea, PND, lower extremity edema, dizziness, presyncope, syncope, snoring, daytime somnolence, bleeding, or neurologic sequela. The patient is tolerating medications without difficulties and is otherwise without complaint today.    Atrial Fibrillation Risk Factors:  she does not have symptoms or diagnosis of sleep apnea. she does not have a history of rheumatic fever.   she has a BMI of Body mass index is 23.01 kg/m.Marland Kitchen Filed Weights   10/22/20 1006  Weight: 53.4 kg    Family History  Problem Relation Age of Onset  . Coronary artery disease Neg Hx      Atrial Fibrillation Management history:  Previous antiarrhythmic drugs: propafenone, amiodarone  Previous cardioversions: none Previous ablations: none CHADS2VASC score: 8 Anticoagulation history: warfarin, Xarelto   Past Medical History:  Diagnosis Date  . Atrial fibrillation (HCC)    dx in setting of stroke 4/12;  echo with EF 60-65%, mild LVH, mild AI, grade 1 diast dysfxn  . Coronary artery disease   . Diabetes mellitus without complication (HCC)   . History of CVA (cerebrovascular accident)    New diagnosis December 08, 2010  . Hx of cardiovascular stress test    a. Myoview 8/12:  EF 69%, no ischemia.   . Hyperlipidemia   . Hypertension   . Impaired glucose tolerance 05/08/2011  . Stroke [facility]) 2012   no deficits  . Tobacco abuse    Ongoing (3-6 cigarettes per day, 25 pack year history)   Past  Surgical History:  Procedure Laterality Date  . COLONOSCOPY N/A 02/01/2016   Procedure: COLONOSCOPY;  Surgeon: Steven Paul Armbruster, MD;  Location: [facility] ENDOSCOPY;  Service: Gastroenterology;  Laterality: N/A;    Current Outpatient Medications  Medication Sig Dispense Refill  . atorvastatin (LIPITOR) 40 MG tablet Take 1 tablet (40 mg total) by mouth daily. 90 tablet 3  . diltiazem (CARDIZEM CD) 180  MG 24 hr capsule Take 180 mg by mouth daily.    . hydrochlorothiazide (MICROZIDE) 12.5 MG capsule Take 12.5 mg by mouth daily.    . lisinopril (ZESTRIL) 40 MG tablet     . mirtazapine (REMERON) 15 MG tablet Take 15 mg by mouth at bedtime.    . Multiple Vitamins-Minerals (ONE-A-DAY WOMENS VITACRAVES) CHEW Chew by mouth.    . XARELTO 20 MG TABS tablet TAKE 1 TABLET(20 MG) BY MOUTH DAILY WITH SUPPER 30 tablet 3  . amiodarone (PACERONE) 200 MG tablet Take 1 tablet (200 mg total) by mouth daily. 30 tablet 3   No current facility-administered medications for this encounter.    No Known Allergies  Social History   Socioeconomic History  . Marital status: Widowed    Spouse name: Not on file  . Number of children: Not on file  . Years of education: Not on file  . Highest education level: Not on file  Occupational History    Employer: [employer]  . Occupation: Not working  Tobacco Use  . Smoking status: Light Tobacco Smoker    Packs/day: 0.25    Years: 25.00    Pack years: 6.25    Types: Cigarettes  . Smokeless tobacco: Never Used  . Tobacco comment: Down to 1 cigarette per day.   Substance and Sexual Activity  . Alcohol use: No  . Drug use: No  . Sexual activity: Not on file  Other Topics Concern  . Not on file  Social History Narrative   Lives in [place] with her daughters   Regular diet   No regular exercise but active and independent with all daily activities   Social Determinants of Health   Financial Resource Strain: Not on file  Food Insecurity: Not on file  Transportation Needs: Not on file  Physical Activity: Not on file  Stress: Not on file  Social Connections: Not on file  Intimate Partner Violence: Not on file     ROS- All systems are reviewed and negative except as per the HPI above.  Physical Exam: Vitals:   10/22/20 1006  BP: (!) 142/82  Pulse: 62  Weight: 53.4 kg  Height: 5' (1.524 m)    GEN- The patient is a well appearing elderly female,  alert and oriented x 3 today.   HEENT-head normocephalic, atraumatic, sclera clear, conjunctiva pink, hearing intact, trachea midline. Lungs- Clear to ausculation bilaterally, normal work of breathing Heart- irregular rate and rhythm, no murmurs, rubs or gallops  GI- soft, NT, ND, + BS Extremities- no clubbing, cyanosis, or edema MS- no significant deformity or atrophy Skin- no rash or lesion Psych- euthymic mood, full affect Neuro- strength and sensation are intact   Wt Readings from Last 3 Encounters:  10/22/20 53.4 kg  10/06/20 52.5 kg  07/07/20 54 kg    EKG today demonstrates  Atrial flutter with variable block Vent. rate 62 BPM PR interval * ms QRS duration 92 ms QT/QTc 510/517 ms  Echo 08/02/20 demonstrated  1. Left ventricular ejection fraction, by estimation, is 60 to 65%. The  left ventricle has normal function.   The left ventricle has no regional  wall motion abnormalities. There is mild left ventricular hypertrophy.  Left ventricular diastolic parameters  are consistent with Grade I diastolic dysfunction (impaired relaxation).  2. Right ventricular systolic function is normal. The right ventricular  size is normal.  3. The mitral valve is normal in structure. Trivial mitral valve  regurgitation. No evidence of mitral stenosis.  4. The aortic valve is tricuspid. Aortic valve regurgitation is moderate.  No aortic stenosis is present. Aortic regurgitation PHT measures 566 msec.  5. Aortic dilatation noted. There is mild dilatation of the ascending  aorta, measuring 39 mm.  6. The inferior vena cava is normal in size with greater than 50%  respiratory variability, suggesting right atrial pressure of 3 mmHg.   Epic records are reviewed at length today  CHA2DS2-VASc Score = 8  The patient's score is based upon: CHF History: No HTN History: Yes Diabetes History: Yes Stroke History: Yes Vascular Disease History: Yes Age Score: 2 Gender Score: 1       ASSESSMENT AND PLAN: 1. Persistent Atrial Fibrillation/atrial flutter The patient's CHA2DS2-VASc score is 8, indicating a 10.8% annual risk of stroke.   Patient in rate controlled atrial flutter today. Will plan for DCCV. Check bmet/CBC Decrease amiodarone to 200 mg daily Continue diltiazem 180 mg daily Continue Xarelto 20 mg daily. Patient denies any missed doses in the last 3 weeks.   2. Secondary Hypercoagulable State (ICD10:  D68.69) The patient is at significant risk for stroke/thromboembolism based upon her CHA2DS2-VASc Score of 8.  Continue Rivaroxaban (Xarelto).   3. CAD No anginal symptoms.  4. HTN Stable, no changes today.   Follow up in the AF clinic post DCCV.   Jorja Loa PA-C [facility] [address] [place], [place] [address] [phone] 10/22/2020 10:28 AM

## 2020-10-22 ENCOUNTER — Other Ambulatory Visit: Payer: Self-pay

## 2020-10-22 ENCOUNTER — Ambulatory Visit (HOSPITAL_COMMUNITY)
Admission: RE | Admit: 2020-10-22 | Discharge: 2020-10-22 | Disposition: A | Discharge status: Home / Self Care | Payer: Medicare HMO | Source: Ambulatory Visit | Attending: Physician Assistant | Admitting: Physician Assistant

## 2020-10-22 ENCOUNTER — Encounter (HOSPITAL_COMMUNITY): Payer: Self-pay | Admitting: Physician Assistant

## 2020-10-22 VITALS — BP 142/82 | HR 62 | Ht 60.0 in | Wt 117.8 lb

## 2020-10-22 DIAGNOSIS — E785 Hyperlipidemia, unspecified: Secondary | ICD-10-CM | POA: Diagnosis not present

## 2020-10-22 DIAGNOSIS — Z7901 Long term (current) use of anticoagulants: Secondary | ICD-10-CM | POA: Diagnosis not present

## 2020-10-22 DIAGNOSIS — E119 Type 2 diabetes mellitus without complications: Secondary | ICD-10-CM | POA: Insufficient documentation

## 2020-10-22 DIAGNOSIS — D6869 Other thrombophilia: Secondary | ICD-10-CM | POA: Diagnosis not present

## 2020-10-22 DIAGNOSIS — I10 Essential (primary) hypertension: Secondary | ICD-10-CM | POA: Diagnosis not present

## 2020-10-22 DIAGNOSIS — I4819 Other persistent atrial fibrillation: Secondary | ICD-10-CM | POA: Diagnosis not present

## 2020-10-22 DIAGNOSIS — Z8673 Personal history of transient ischemic attack (TIA), and cerebral infarction without residual deficits: Secondary | ICD-10-CM | POA: Insufficient documentation

## 2020-10-22 DIAGNOSIS — F17210 Nicotine dependence, cigarettes, uncomplicated: Secondary | ICD-10-CM | POA: Diagnosis not present

## 2020-10-22 DIAGNOSIS — Z79899 Other long term (current) drug therapy: Secondary | ICD-10-CM | POA: Insufficient documentation

## 2020-10-22 DIAGNOSIS — I2510 Atherosclerotic heart disease of native coronary artery without angina pectoris: Secondary | ICD-10-CM | POA: Insufficient documentation

## 2020-10-22 DIAGNOSIS — I484 Atypical atrial flutter: Secondary | ICD-10-CM | POA: Diagnosis not present

## 2020-10-22 LAB — BASIC METABOLIC PANEL
Anion gap: 13 (ref 5–15)
BUN: 22 mg/dL (ref 8–23)
CO2: 21 mmol/L — ABNORMAL LOW (ref 22–32)
Calcium: 9.3 mg/dL (ref 8.9–10.3)
Chloride: 103 mmol/L (ref 98–111)
Creatinine, Ser: 1 mg/dL (ref 0.44–1.00)
GFR, Estimated: 57 mL/min — ABNORMAL LOW (ref >60)
Glucose, Bld: 144 mg/dL — ABNORMAL HIGH (ref 70–99)
Potassium: 4 mmol/L (ref 3.5–5.1)
Sodium: 137 mmol/L (ref 135–145)

## 2020-10-22 LAB — CBC
HCT: 47 % — ABNORMAL HIGH (ref 36.0–46.0)
Hemoglobin: 15.3 g/dL — ABNORMAL HIGH (ref 12.0–15.0)
MCH: 29.1 pg (ref 26.0–34.0)
MCHC: 32.6 g/dL (ref 30.0–36.0)
MCV: 89.5 fL (ref 80.0–100.0)
Platelets: 280 10*3/uL (ref 150–400)
RBC: 5.25 MIL/uL — ABNORMAL HIGH (ref 3.87–5.11)
RDW: 13.2 % (ref 11.5–15.5)
WBC: 9.6 10*3/uL (ref 4.0–10.5)
nRBC: 0 % (ref 0.0–0.2)

## 2020-10-22 MED ORDER — AMIODARONE HCL 200 MG PO TABS: 200.0000 mg | ORAL_TABLET | Freq: Every day | ORAL | 3 refills | Status: DC

## 2020-10-22 NOTE — Patient Instructions (Signed)
Amiodarone to 200mg  once a day  Cardioversion scheduled for Thursday, March 10th  - Arrive at the March 12 and go to admitting at 730AM  - Do not eat or drink anything after midnight the night prior to your procedure.  - Take all your morning medication (except diabetic medications) with a sip of water prior to arrival.  - You will not be able to drive home after your procedure.  - Do NOT miss any doses of your blood thinner - if you should miss a dose please notify our office immediately.  - If you feel as if you go back into normal rhythm prior to scheduled cardioversion, please notify our office immediately. If your procedure is canceled in the cardioversion suite you will be charged a cancellation fee.

## 2020-10-26 ENCOUNTER — Other Ambulatory Visit (HOSPITAL_COMMUNITY)
Admission: RE | Admit: 2020-10-26 | Discharge: 2020-10-26 | Disposition: A | Discharge status: Home / Self Care | Payer: Medicare HMO | Source: Ambulatory Visit | Attending: Cardiology | Admitting: Cardiology

## 2020-10-26 DIAGNOSIS — Z20822 Contact with and (suspected) exposure to covid-19: Secondary | ICD-10-CM | POA: Diagnosis not present

## 2020-10-26 DIAGNOSIS — Z01812 Encounter for preprocedural laboratory examination: Secondary | ICD-10-CM | POA: Diagnosis present

## 2020-10-26 LAB — SARS CORONAVIRUS 2 (TAT 6-24 HRS): SARS Coronavirus 2: NEGATIVE

## 2020-10-27 ENCOUNTER — Other Ambulatory Visit (HOSPITAL_COMMUNITY): Payer: Self-pay | Admitting: *Deleted

## 2020-10-27 MED ORDER — HYDROCHLOROTHIAZIDE 12.5 MG PO CAPS: 12.5000 mg | ORAL_CAPSULE | Freq: Every day | ORAL | 2 refills | Status: DC

## 2020-10-27 MED ORDER — LISINOPRIL 40 MG PO TABS: 40.0000 mg | ORAL_TABLET | Freq: Every day | ORAL | 1 refills | Status: DC

## 2020-10-28 ENCOUNTER — Encounter (HOSPITAL_COMMUNITY): Payer: Self-pay | Admitting: Cardiology

## 2020-10-28 ENCOUNTER — Ambulatory Visit (HOSPITAL_COMMUNITY): Payer: Medicare HMO | Admitting: Certified Registered"

## 2020-10-28 ENCOUNTER — Ambulatory Visit (HOSPITAL_COMMUNITY)
Admission: RE | Admit: 2020-10-28 | Discharge: 2020-10-28 | Disposition: A | Discharge status: Home / Self Care | Payer: Medicare HMO | Attending: Cardiology | Admitting: Cardiology

## 2020-10-28 ENCOUNTER — Other Ambulatory Visit: Payer: Self-pay

## 2020-10-28 ENCOUNTER — Encounter (HOSPITAL_COMMUNITY)
Admission: RE | Disposition: A | Discharge status: Home / Self Care | Payer: Self-pay | Source: Home / Self Care | Attending: Cardiology

## 2020-10-28 DIAGNOSIS — D6869 Other thrombophilia: Secondary | ICD-10-CM | POA: Insufficient documentation

## 2020-10-28 DIAGNOSIS — I10 Essential (primary) hypertension: Secondary | ICD-10-CM | POA: Diagnosis not present

## 2020-10-28 DIAGNOSIS — Z79899 Other long term (current) drug therapy: Secondary | ICD-10-CM | POA: Diagnosis not present

## 2020-10-28 DIAGNOSIS — I4892 Unspecified atrial flutter: Secondary | ICD-10-CM | POA: Insufficient documentation

## 2020-10-28 DIAGNOSIS — Z7901 Long term (current) use of anticoagulants: Secondary | ICD-10-CM | POA: Insufficient documentation

## 2020-10-28 DIAGNOSIS — F17210 Nicotine dependence, cigarettes, uncomplicated: Secondary | ICD-10-CM | POA: Insufficient documentation

## 2020-10-28 DIAGNOSIS — I2510 Atherosclerotic heart disease of native coronary artery without angina pectoris: Secondary | ICD-10-CM | POA: Diagnosis not present

## 2020-10-28 DIAGNOSIS — I4819 Other persistent atrial fibrillation: Secondary | ICD-10-CM

## 2020-10-28 HISTORY — PX: CARDIOVERSION: SHX1299

## 2020-10-28 SURGERY — CARDIOVERSION
Anesthesia: Monitor Anesthesia Care

## 2020-10-28 MED ORDER — LIDOCAINE 2% (20 MG/ML) 5 ML SYRINGE
INTRAMUSCULAR | Status: DC | PRN
  Administered 2020-10-28: 60 mg via INTRAVENOUS

## 2020-10-28 MED ORDER — SODIUM CHLORIDE 0.9 % IV SOLN
INTRAVENOUS | Status: AC | PRN
  Administered 2020-10-28: 500 mL via INTRAVENOUS

## 2020-10-28 MED ORDER — PROPOFOL 10 MG/ML IV BOLUS
INTRAVENOUS | Status: DC | PRN
  Administered 2020-10-28: 40 mg via INTRAVENOUS

## 2020-10-28 NOTE — Interval H&P Note (Signed)
History and Physical Interval Note:  10/28/2020 8:24 AM  Linda [name]  has presented today for surgery, with the diagnosis of afib.  The various methods of treatment have been discussed with the patient and family. After consideration of risks, benefits and other options for treatment, the patient has consented to  Procedure(s): CARDIOVERSION (N/A) as a surgical intervention.  The patient's history has been reviewed, patient examined, no change in status, stable for surgery.  I have reviewed the patient's chart and labs.  Questions were answered to the patient's satisfaction.     [name]

## 2020-10-28 NOTE — Transfer of Care (Signed)
Immediate Anesthesia Transfer of Care Note  Patient: Linda [name]  Procedure(s) Performed: CARDIOVERSION (N/A )  Patient Location: Endoscopy Unit  Anesthesia Type:General  Level of Consciousness: drowsy  Airway & Oxygen Therapy: Patient Spontanous Breathing  Post-op Assessment: Report given to RN and Post -op Vital signs reviewed and stable  Post vital signs: Reviewed and stable  Last Vitals:  Vitals Value Taken Time  BP    Temp    Pulse    Resp    SpO2      Last Pain:  Vitals:   10/28/20 0745  TempSrc: Oral  PainSc: 0-No pain         Complications: No complications documented.

## 2020-10-28 NOTE — CV Procedure (Signed)
Procedure:   DCCV  Indication:  Symptomatic atrial fibrillation  Procedure Note:  The patient signed informed consent.  They have had had therapeutic anticoagulation with Eliquis greater than 3 weeks.  Anesthesia was administered by Dr. Donavan Foil.  Adequate airway was maintained throughout and vital followed per protocol.  They were cardioverted x 1 with 120J of biphasic synchronized energy.  They converted to NSR with rate 60s.  There were no apparent complications.  The patient had normal neuro status and respiratory status post procedure with vitals stable as recorded elsewhere.    Follow up:  They will continue on current medical therapy and follow up with cardiology as scheduled.  Epifanio Lesches, MD 10/28/2020 8:35 AM

## 2020-10-28 NOTE — Anesthesia Postprocedure Evaluation (Signed)
Anesthesia Post Note  Patient: Linda [name]  Procedure(s) Performed: CARDIOVERSION (N/A )     Patient location during evaluation: PACU Anesthesia Type: MAC Level of consciousness: awake Pain management: pain level controlled Vital Signs Assessment: post-procedure vital signs reviewed and stable Respiratory status: spontaneous breathing and respiratory function stable Cardiovascular status: stable Postop Assessment: no apparent nausea or vomiting Anesthetic complications: no   No complications documented.  Last Vitals:  Vitals:   10/28/20 0835 10/28/20 0845  BP: 139/70 (!) 159/65  Pulse: 61 64  Resp: 19 11  Temp:    SpO2: 100% 100%    Last Pain:  Vitals:   10/28/20 0835  TempSrc:   PainSc: 0-No pain                 Merlinda Frederick

## 2020-10-28 NOTE — Anesthesia Procedure Notes (Signed)
Procedure Name: General with mask airway Date/Time: 10/28/2020 8:22 AM Performed by: Elliot Dally, CRNA Pre-anesthesia Checklist: Patient identified, Emergency Drugs available, Suction available, Patient being monitored and Timeout performed Patient Re-evaluated:Patient Re-evaluated prior to induction Oxygen Delivery Method: Ambu bag Preoxygenation: Pre-oxygenation with 100% oxygen

## 2020-10-28 NOTE — Discharge Instructions (Signed)
Electrical Cardioversion Electrical cardioversion is the delivery of a jolt of electricity to restore a normal rhythm to the heart. A rhythm that is too fast or is not regular keeps the heart from pumping well. In this procedure, sticky patches or metal paddles are placed on the chest to deliver electricity to the heart from a device. This procedure may be done in an emergency if:  There is low or no blood pressure as a result of the heart rhythm.  Normal rhythm must be restored as fast as possible to protect the brain and heart from further damage.  It may save a life. This may also be a scheduled procedure for irregular or fast heart rhythms that are not immediately life-threatening. Tell a health care provider about:  Any allergies you have.  All medicines you are taking, including vitamins, herbs, eye drops, creams, and over-the-counter medicines.  Any problems you or family members have had with anesthetic medicines.  Any blood disorders you have.  Any surgeries you have had.  Any medical conditions you have.  Whether you are pregnant or may be pregnant. What are the risks? Generally, this is a safe procedure. However, problems may occur, including:  Allergic reactions to medicines.  A blood clot that breaks free and travels to other parts of your body.  The possible return of an abnormal heart rhythm within hours or days after the procedure.  Your heart stopping (cardiac arrest). This is rare. What happens before the procedure? Medicines  Your health care provider may have you start taking: ? Blood-thinning medicines (anticoagulants) so your blood does not clot as easily. ? Medicines to help stabilize your heart rate and rhythm.  Ask your health care provider about: ? Changing or stopping your regular medicines. This is especially important if you are taking diabetes medicines or blood thinners. ? Taking medicines such as aspirin and ibuprofen. These medicines can  thin your blood. Do not take these medicines unless your health care provider tells you to take them. ? Taking over-the-counter medicines, vitamins, herbs, and supplements. General instructions  Follow instructions from your health care provider about eating or drinking restrictions.  Plan to have someone take you home from the hospital or clinic.  If you will be going home right after the procedure, plan to have someone with you for 24 hours.  Ask your health care provider what steps will be taken to help prevent infection. These may include washing your skin with a germ-killing soap. What happens during the procedure?  An IV will be inserted into one of your veins.  Sticky patches (electrodes) or metal paddles may be placed on your chest.  You will be given a medicine to help you relax (sedative).  An electrical shock will be delivered. The procedure may vary among health care providers and hospitals.   What can I expect after the procedure?  Your blood pressure, heart rate, breathing rate, and blood oxygen level will be monitored until you leave the hospital or clinic.  Your heart rhythm will be watched to make sure it does not change.  You may have some redness on the skin where the shocks were given. Follow these instructions at home:  Do not drive for 24 hours if you were given a sedative during your procedure.  Take over-the-counter and prescription medicines only as told by your health care provider.  Ask your health care provider how to check your pulse. Check it often.  Rest for 48 hours after the procedure   or as told by your health care provider.  Avoid or limit your caffeine use as told by your health care provider.  Keep all follow-up visits as told by your health care provider. This is important. Contact a health care provider if:  You feel like your heart is beating too quickly or your pulse is not regular.  You have a serious muscle cramp that does not go  away. Get help right away if:  You have discomfort in your chest.  You are dizzy or you feel faint.  You have trouble breathing or you are short of breath.  Your speech is slurred.  You have trouble moving an arm or leg on one side of your body.  Your fingers or toes turn cold or blue. Summary  Electrical cardioversion is the delivery of a jolt of electricity to restore a normal rhythm to the heart.  This procedure may be done right away in an emergency or may be a scheduled procedure if the condition is not an emergency.  Generally, this is a safe procedure.  After the procedure, check your pulse often as told by your health care provider. This information is not intended to replace advice given to you by your health care provider. Make sure you discuss any questions you have with your health care provider. Document Revised: 03/10/2019 Document Reviewed: 03/10/2019 Elsevier Patient Education  2021 Elsevier Inc.  

## 2020-10-28 NOTE — Anesthesia Preprocedure Evaluation (Addendum)
Anesthesia Evaluation  Patient identified by MRN, date of birth, ID band Patient awake    Reviewed: Allergy & Precautions, NPO status , Patient's Chart, lab work & pertinent test results  Airway Mallampati: II  TM Distance: >3 FB Neck ROM: Full    Dental no notable dental hx.    Pulmonary Current Smoker,    Pulmonary exam normal breath sounds clear to auscultation       Cardiovascular hypertension, + CAD and +CHF  Normal cardiovascular exam+ dysrhythmias Atrial Fibrillation  Rhythm:Regular Rate:Normal     Neuro/Psych CVA    GI/Hepatic negative GI ROS, Neg liver ROS,   Endo/Other  diabetes  Renal/GU negative Renal ROS  negative genitourinary   Musculoskeletal negative musculoskeletal ROS (+)   Abdominal   Peds  Hematology negative hematology ROS (+)   Anesthesia Other Findings   Reproductive/Obstetrics negative OB ROS                            Anesthesia Physical Anesthesia Plan  ASA: III  Anesthesia Plan: General   Post-op Pain Management:    Induction: Intravenous  PONV Risk Score and Plan: 1 and TIVA, Propofol infusion and Treatment may vary due to age or medical condition  Airway Management Planned: Simple Face Mask and Mask  Additional Equipment: None  Intra-op Plan:   Post-operative Plan:   Informed Consent: I have reviewed the patients History and Physical, chart, labs and discussed the procedure including the risks, benefits and alternatives for the proposed anesthesia with the patient or authorized representative who has indicated his/her understanding and acceptance.       Plan Discussed with: Anesthesiologist and CRNA  Anesthesia Plan Comments:       Anesthesia Quick Evaluation

## 2020-10-31 ENCOUNTER — Encounter (HOSPITAL_COMMUNITY): Payer: Self-pay | Admitting: Cardiology

## 2020-11-01 ENCOUNTER — Other Ambulatory Visit (HOSPITAL_COMMUNITY): Payer: Self-pay | Admitting: Physician Assistant

## 2020-11-08 ENCOUNTER — Ambulatory Visit (HOSPITAL_COMMUNITY)
Admission: RE | Admit: 2020-11-08 | Discharge: 2020-11-08 | Disposition: A | Discharge status: Home / Self Care | Payer: Medicare HMO | Source: Ambulatory Visit | Attending: Physician Assistant | Admitting: Physician Assistant

## 2020-11-08 ENCOUNTER — Encounter (HOSPITAL_COMMUNITY): Payer: Self-pay | Admitting: Physician Assistant

## 2020-11-08 ENCOUNTER — Other Ambulatory Visit: Payer: Self-pay

## 2020-11-08 VITALS — BP 148/88 | HR 77 | Ht 60.0 in | Wt 117.2 lb

## 2020-11-08 DIAGNOSIS — E119 Type 2 diabetes mellitus without complications: Secondary | ICD-10-CM | POA: Diagnosis not present

## 2020-11-08 DIAGNOSIS — D6869 Other thrombophilia: Secondary | ICD-10-CM

## 2020-11-08 DIAGNOSIS — Z79899 Other long term (current) drug therapy: Secondary | ICD-10-CM | POA: Insufficient documentation

## 2020-11-08 DIAGNOSIS — E785 Hyperlipidemia, unspecified: Secondary | ICD-10-CM | POA: Diagnosis not present

## 2020-11-08 DIAGNOSIS — I2510 Atherosclerotic heart disease of native coronary artery without angina pectoris: Secondary | ICD-10-CM | POA: Diagnosis not present

## 2020-11-08 DIAGNOSIS — I4819 Other persistent atrial fibrillation: Secondary | ICD-10-CM | POA: Diagnosis present

## 2020-11-08 DIAGNOSIS — Z8673 Personal history of transient ischemic attack (TIA), and cerebral infarction without residual deficits: Secondary | ICD-10-CM | POA: Diagnosis not present

## 2020-11-08 DIAGNOSIS — I10 Essential (primary) hypertension: Secondary | ICD-10-CM | POA: Diagnosis not present

## 2020-11-08 DIAGNOSIS — Z7901 Long term (current) use of anticoagulants: Secondary | ICD-10-CM | POA: Diagnosis not present

## 2020-11-08 DIAGNOSIS — F17210 Nicotine dependence, cigarettes, uncomplicated: Secondary | ICD-10-CM | POA: Insufficient documentation

## 2020-11-08 MED ORDER — RIVAROXABAN 20 MG PO TABS: ORAL_TABLET | ORAL | 6 refills | Status: DC

## 2020-11-08 NOTE — Progress Notes (Signed)
Primary Care Physician: [name], MD Primary Cardiologist: Dr [name] Primary Electrophysiologist: Dr Graciela Husbands (remotely) Referring Physician: Dr Pura Spice is a 80 y.o. female with a history of CAD, DM, prior CVA, HTN, HLD, tobacco abuse, and persistent atrial fibrillation who presents for follow up in the [facility].  The patient was initially diagnosed with atrial fibrillation remotely and had previously been on propafenone. She had followed with Dr Graciela Husbands but was lost to follow up in 2018. Patient is on Xarelto for a CHADS2VASC score of 8. She reports that over the last year she has had intermittent episodes of SOB and palpitations. This occurs 2-3 times in 2 weeks. The symptoms last ~1 minute. Zio patch showed 21% afib burden with elevated rates. Seen by Dr [name] on 07/01/20 and started on metoprolol.   Patient hospitalized at [facility] 09/21/20 with hypertensive emergency, flash pulmonary edema, and rapid afib. She presented with chest pressure and shortness of breath. BP noted by EMS to be in the 240s systolic. Patient arrived to ED and placed on BiPAP with improvement in symptoms. CXR noted pulmonary edema EKG noted afib with RVR and a rate of 130-150. Patient with RVR, initially trialed on amio bolus/gtt but switched to dilt gtt when amio did not cause any effect. dilt led to good rate control and patient transitioned to PO cardizem. Cardiology consulted as well and consolidated to 120 mg QD cardizem, patient became tachycardic. Increased cardizem to 180 mg QD. Patient was given labetalol, lasix, and cardizem to treat patient which resulted in drop in BP. BP medications were adjusted and increased lisinopril to 40 mg QD, increased cardizem to 180 mg QD and HCTZ 12.5 mg QD.   Interpreter utilized for visit. On follow up today, patient is s/p DCCV on 10/28/20. Unfortunately, she is back in rate controlled afib today. She reports that she feels a little  improved since DCCV. She denies any bleeding issues on anticoagulation.   Today, she denies symptoms of palpitations, SOB, chest pain, orthopnea, PND, lower extremity edema, dizziness, presyncope, syncope, snoring, daytime somnolence, bleeding, or neurologic sequela. The patient is tolerating medications without difficulties and is otherwise without complaint today.    Atrial Fibrillation Risk Factors:  she does not have symptoms or diagnosis of sleep apnea. she does not have a history of rheumatic fever.   she has a BMI of Body mass index is 22.89 kg/m.Marland Kitchen Filed Weights   11/08/20 1000  Weight: 53.2 kg    Family History  Problem Relation Age of Onset  . Coronary artery disease Neg Hx      Atrial Fibrillation Management history:  Previous antiarrhythmic drugs: propafenone, amiodarone  Previous cardioversions: 10/28/20 Previous ablations: none CHADS2VASC score: 8 Anticoagulation history: warfarin, Xarelto   Past Medical History:  Diagnosis Date  . Atrial fibrillation (HCC)    dx in setting of stroke 4/12;  echo with EF 60-65%, mild LVH, mild AI, grade 1 diast dysfxn  . Coronary artery disease   . Diabetes mellitus without complication (HCC)   . History of CVA (cerebrovascular accident)    New diagnosis December 08, 2010  . Hx of cardiovascular stress test    a. Myoview 8/12:  EF 69%, no ischemia.   . Hyperlipidemia   . Hypertension   . Impaired glucose tolerance 05/08/2011  . Stroke [facility]) 2012   no deficits  . Tobacco abuse    Ongoing (3-6 cigarettes per day, 25 pack year history)  Past Surgical History:  Procedure Laterality Date  . CARDIOVERSION N/A 10/28/2020   Procedure: CARDIOVERSION;  Surgeon: [name], MD;  Location: [facility] ENDOSCOPY;  Service: Cardiovascular;  Laterality: N/A;  . COLONOSCOPY N/A 02/01/2016   Procedure: COLONOSCOPY;  Surgeon: Ruffin Frederick, MD;  Location: [facility] ENDOSCOPY;  Service: Gastroenterology;  Laterality: N/A;     Current Outpatient Medications  Medication Sig Dispense Refill  . amiodarone (PACERONE) 200 MG tablet Take 1 tablet (200 mg total) by mouth daily. 30 tablet 3  . atorvastatin (LIPITOR) 20 MG tablet Take 20 mg by mouth at bedtime.    . carvedilol (COREG) 12.5 MG tablet Take 12.5 mg by mouth 2 (two) times daily with a meal.    . hydrochlorothiazide (MICROZIDE) 12.5 MG capsule Take 1 capsule (12.5 mg total) by mouth daily. 90 capsule 2  . lisinopril (ZESTRIL) 40 MG tablet Take 1 tablet (40 mg total) by mouth daily. 90 tablet 1  . Menthol, Topical Analgesic, (BENGAY EX) Apply 1 application topically daily as needed (pain).    . Menthol-Methyl Salicylate (SALONPAS PAIN RELIEF PATCH EX) Apply 1 patch topically daily as needed (pain).    . mirtazapine (REMERON) 15 MG tablet Take 15 mg by mouth at bedtime.    . Multiple Vitamin (MULTIVITAMIN WITH MINERALS) TABS tablet Take 1 tablet by mouth daily.    . rivaroxaban (XARELTO) 20 MG TABS tablet TAKE 1 TABLET(20 MG) BY MOUTH DAILY WITH SUPPER 30 tablet 6   No current facility-administered medications for this encounter.    No Known Allergies  Social History   Socioeconomic History  . Marital status: Widowed    Spouse name: Not on file  . Number of children: Not on file  . Years of education: Not on file  . Highest education level: Not on file  Occupational History    Employer: [employer]  . Occupation: Not working  Tobacco Use  . Smoking status: Light Tobacco Smoker    Packs/day: 0.25    Years: 25.00    Pack years: 6.25    Types: Cigarettes  . Smokeless tobacco: Never Used  . Tobacco comment: Down to 1 cigarette per day.   Substance and Sexual Activity  . Alcohol use: No  . Drug use: No  . Sexual activity: Not on file  Other Topics Concern  . Not on file  Social History Narrative   Lives in [place] with her daughters   Regular diet   No regular exercise but active and independent with all daily activities   Social  Determinants of Health   Financial Resource Strain: Not on file  Food Insecurity: Not on file  Transportation Needs: Not on file  Physical Activity: Not on file  Stress: Not on file  Social Connections: Not on file  Intimate Partner Violence: Not on file     ROS- All systems are reviewed and negative except as per the HPI above.  Physical Exam: Vitals:   11/08/20 1000  BP: (!) 148/88  Pulse: 77  Weight: 53.2 kg  Height: 5' (1.524 m)    GEN- The patient is a well appearing elderly female, alert and oriented x 3 today.   HEENT-head normocephalic, atraumatic, sclera clear, conjunctiva pink, hearing intact, trachea midline. Lungs- Clear to ausculation bilaterally, normal work of breathing Heart- irregular rate and rhythm, no murmurs, rubs or gallops  GI- soft, NT, ND, + BS Extremities- no clubbing, cyanosis, or edema MS- no significant deformity or atrophy Skin- no rash or lesion Psych-  euthymic mood, full affect Neuro- strength and sensation are intact   Wt Readings from Last 3 Encounters:  11/08/20 53.2 kg  10/28/20 53.4 kg  10/22/20 53.4 kg    EKG today demonstrates  Coarse afib vs atypical atrial flutter with variable block HR 77 PR * QRS 88 QTc 484  Echo 08/02/20 demonstrated  1. Left ventricular ejection fraction, by estimation, is 60 to 65%. The  left ventricle has normal function. The left ventricle has no regional  wall motion abnormalities. There is mild left ventricular hypertrophy.  Left ventricular diastolic parameters  are consistent with Grade I diastolic dysfunction (impaired relaxation).  2. Right ventricular systolic function is normal. The right ventricular  size is normal.  3. The mitral valve is normal in structure. Trivial mitral valve  regurgitation. No evidence of mitral stenosis.  4. The aortic valve is tricuspid. Aortic valve regurgitation is moderate.  No aortic stenosis is present. Aortic regurgitation PHT measures 566 msec.  5.  Aortic dilatation noted. There is mild dilatation of the ascending  aorta, measuring 39 mm.  6. The inferior vena cava is normal in size with greater than 50%  respiratory variability, suggesting right atrial pressure of 3 mmHg.   Epic records are reviewed at length today  CHA2DS2-VASc Score = 8  The patient's score is based upon: CHF History: No HTN History: Yes Diabetes History: Yes Stroke History: Yes Vascular Disease History: Yes Age Score: 2 Gender Score: 1      ASSESSMENT AND PLAN: 1. Persistent Atrial Fibrillation/atrial flutter The patient's CHA2DS2-VASc score is 8, indicating a 10.8% annual risk of stroke.   S/p DCCV on 10/28/20 with early return of afib. We discussed therapeutic options today including loading on amiodarone longer with repeat DCCV vs ablation. She is fairly asymptomatic now with her afib but was recently hospitalized with RVR. Patient is unsure what she would like to do. Will have her see EP to establish care in case she wants to pursue ablation.  Continue amiodarone 200 mg daily Continue diltiazem 180 mg daily Continue Xarelto 20 mg daily  2. Secondary Hypercoagulable State (ICD10:  D68.69) The patient is at significant risk for stroke/thromboembolism based upon her CHA2DS2-VASc Score of 8.  Continue Rivaroxaban (Xarelto).   3. CAD No anginal symptoms.  4. HTN Stable, no changes today.   Follow up with EP.    Jorja Loa PA-C Af[facility] [address] [place], [place] [address] [phone] 11/08/2020 12:59 PM

## 2020-11-25 ENCOUNTER — Institutional Professional Consult (permissible substitution): Payer: Medicare HMO | Admitting: Cardiology

## 2020-12-21 ENCOUNTER — Ambulatory Visit (INDEPENDENT_AMBULATORY_CARE_PROVIDER_SITE_OTHER): Payer: Medicare HMO | Admitting: Cardiology

## 2020-12-21 ENCOUNTER — Encounter: Payer: Self-pay | Admitting: Cardiology

## 2020-12-21 ENCOUNTER — Other Ambulatory Visit: Payer: Self-pay

## 2020-12-21 VITALS — BP 146/82 | HR 64 | Ht 60.0 in | Wt 120.8 lb

## 2020-12-21 DIAGNOSIS — I4819 Other persistent atrial fibrillation: Secondary | ICD-10-CM | POA: Diagnosis not present

## 2020-12-21 DIAGNOSIS — I484 Atypical atrial flutter: Secondary | ICD-10-CM | POA: Diagnosis not present

## 2020-12-21 DIAGNOSIS — I10 Essential (primary) hypertension: Secondary | ICD-10-CM

## 2020-12-21 DIAGNOSIS — R072 Precordial pain: Secondary | ICD-10-CM

## 2020-12-21 DIAGNOSIS — Z79899 Other long term (current) drug therapy: Secondary | ICD-10-CM

## 2020-12-21 LAB — COMPREHENSIVE METABOLIC PANEL
ALT: 30 IU/L (ref 0–32)
AST: 26 IU/L (ref 0–40)
Albumin/Globulin Ratio: 1.7 (ref 1.2–2.2)
Albumin: 4.4 g/dL (ref 3.7–4.7)
Alkaline Phosphatase: 80 IU/L (ref 44–121)
BUN/Creatinine Ratio: 16 (ref 12–28)
BUN: 16 mg/dL (ref 8–27)
Bilirubin Total: 0.4 mg/dL (ref 0.0–1.2)
CO2: 22 mmol/L (ref 20–29)
Calcium: 8.8 mg/dL (ref 8.7–10.3)
Chloride: 100 mmol/L (ref 96–106)
Creatinine, Ser: 1.02 mg/dL — ABNORMAL HIGH (ref 0.57–1.00)
Globulin, Total: 2.6 g/dL (ref 1.5–4.5)
Glucose: 100 mg/dL — ABNORMAL HIGH (ref 65–99)
Potassium: 4 mmol/L (ref 3.5–5.2)
Sodium: 140 mmol/L (ref 134–144)
Total Protein: 7 g/dL (ref 6.0–8.5)
eGFR: 56 mL/min/{1.73_m2} — ABNORMAL LOW (ref >59)

## 2020-12-21 LAB — T4, FREE: Free T4: 1.71 ng/dL (ref 0.82–1.77)

## 2020-12-21 LAB — TSH: TSH: 3.66 u[IU]/mL (ref 0.450–4.500)

## 2020-12-21 NOTE — Patient Instructions (Addendum)
Medication Instructions:  Your physician recommends that you continue on your current medications as directed. Please refer to the Current Medication list given to you today. *If you need a refill on your cardiac medications before your next appointment, please call your pharmacy*  Lab Work: You will get lab work today:  CMP, TSH and free T4  If you have labs (blood work) drawn today and your tests are completely normal, you will receive your results only by: Marland Kitchen. MyChart Message (if you have MyChart) OR . A paper copy in the mail If you have any lab test that is abnormal or we need to change your treatment, we will call you to review the results.  Testing/Procedures:  You will be scheduled for an exercise stress test.  Follow-Up: At [facility], you and your health needs are our priority.  As part of our continuing mission to provide you with exceptional heart care, we have created designated Provider Care Teams.  These Care Teams include your primary Cardiologist (physician) and Advanced Practice Providers (APPs -  Physician Assistants and Nurse Practitioners) who all work together to provide you with the care you need, when you need it.  Your next appointment:   Your physician wants you to follow-up in: 6-8 weeks with Dr. Lalla BrothersLambert.      Nghi?m php g?ng s?c Exercise Stress Test Nghi?m php g?ng s?c l nghi?m php ???c th?c hi?n ?? thu th?p thng tin v? ch?c n?ng tim c?a qu v? ho?t ??ng th? no trong th?i gian t?p luy?n. Nghi?m php ny ???c th?c hi?n trong khi qu v? ?ang ?i b? trn m?t my ch?y b? ho?c s? d?ng xe ??p t?p luy?n. M?c tiu l ?? t?ng nh?p tim v "lm g?ng s?c" tim. Tim ???c ?nh gi tr??c, trong v sau khi qu v? t?p luy?n. ?i?n tm ?? (ECG) s? ???c s? d?ng ?? theo di tim v huy?t p c?a qu v? c?ng s? ???c theo di. Trong m?t s? tr??ng h?p, ch?p h?t nhn ho?c siu m tim (siu m tim) c?ng s? ???c th?c hi?n ?? ?nh gi tim qu v?. Nghi?m php g?ng s?c b?ng t?p luy?n ???c  th?c hi?n ?? pht hi?n b?nh ??ng m?ch vnh (CAD). Nghi?m php ny c?ng c th? ???c th?c hi?n ??:  ?nh gi gi?i h?n t?p luy?n trong th?i gian ph?c h?i ch?c n?ng tim.  Ki?m tra huy?t p cao trong khi t?p luy?n.  Ki?m tra xem qu v? c th? t?p luy?n t?t nh? th? no sau khi dng nh?ng bi?n php ?i?u tr? nh? ??t stent m?ch vnh ho?c dng cc lo?i thu?c m?i.  Ki?m tra cc v?n ?? v?i l?u l??ng mu ??n cnh tay v chn qu v? trong lc t?p luy?n. N?u qu v? c k?t qu? nghi?m php g?ng s?c b?t th??ng, ?i?u ny c th? c ngh?a l qu v? khng nh?n ?? l?u l??ng mu ??n tim trong khi t?p luy?n. C th? c?n ki?m tra thm ?? bi?t t?i sao k?t qu? nghi?m php c?a qu v? l?i b?t th??ng. Hy cho chuyn gia ch?m [place] s?c kh?e bi?t v?:  B?t k? d? ?ng no m qu v? c.  T?t c? cc lo?i thu?c m qu v? ?ang s? d?ng, bao g?m c? vitamin, th?o d??c, thu?c nh? m?t, thu?c d?ng kem v thu?c khng k ??n.  B?t k? b?nh l v? mu no m qu v? c.  B?t k? ph?u thu?t no qu v? ? c.  B?t k? tnh tr?ng b?nh  l no qu v? c.  Qu v? c ?ang mang thai ho?c c th? c thai hay khng. C nh?ng nguy c? no? Nhn chung, ?y l m?t th? thu?t an ton. Tuy nhin, cc v?n ?? c th? x?y ra, bao g?m:  ?au ho?c t?c ? nh?ng vng sau: ? Ng?c. ? Hm ho?c c?. ? Gi?a hai x??ng b? vai. ? Xu?ng cnh tay tri.  Chng m?t ho?c chong vng.  Kh th?.  Nh?p tim t?ng ho?c khng ??u.  Bu?n nn ho?c nn.  Nh?i mu c? tim (hi?m g?p).  Nh?p tim b?t th??ng ?e d?a ??n tnh m?ng (hi?m g?p). ?i?u g x?y ra tr??c khi lm th? thu?t?  Tun th? ch? d?n c?a chuyn gia ch?m [place] s?c kh?e v? cc h?n ch? ?n ho?c u?ng. ? Qu v? c th? ???c ch? d?n trnh t?t c? cc d?ng caffeine trong 24 gi? tr??c khi th?c hi?n nghi?m php. Nh?ng th? ny bao g?m c ph, tr (k? c? tr ? b? caffein), n??c soda c caffein, s-c-la, c-ca v m?t s? lo?i thu?c gi?m ?au nh?t ??nh.  Hy h?i chuyn gia ch?m [place] s?c kh?e v? vi?c: ? Dng cc thu?c khng k ??n, cc  vitamin, th?o d??c v th?c ph?m ch?c n?ng. ? Thay ??i ho?c d?ng s? d?ng cc lo?i thu?c th??ng xuyn dng c?a qu v?. ?i?u ny ??c bi?t quan tr?ng n?u qu v? ?ang dng thu?c ?i?u tr? ti?u ???ng ho?c thu?c ch?n beta.  N?u qu v? b? ti?u ???ng, hy h?i xem qu v? s? dng insulin ho?c thu?c nh? th? no. Th??ng ph?i ?i?u ch?nh li?u insulin vo bu?i sng lm nghi?m php.  N?u qu v? ?ang dng thu?c ch?n beta, ?i?u quan tr?ng l ph?i ni v?i chuyn gia ch?m [place] s?c kh?e v? nh?ng lo?i thu?c ny s?m tr??c ngy lm nghi?m php. Vi?c dng thu?c ch?n beta c th? ?nh h??ng ??n nghi?m php. Trong m?t s? tr??ng h?p, nh?ng thu?c ny c th? c?n thay ??i ho?c d?ng tr??c khi lm nghi?m php t? 24 gi? tr? ln.  N?u qu v? ?ang dn m?t mi?ng dn nitroglycerin, c th? c?n b? mi?ng dn ? ra tr??c khi lm nghi?m php. Hy h?i chuyn gia ch?m [place] s?c kh?e xem c c?n b? mi?ng dn ra tr??c khi lm nghi?m php hay khng.  N?u qu v? s? d?ng thu?c d?ng ht ?? ?i?u tr? b?t k? tnh tr?ng b?nh l no v? h h?p, hy mang lo?i thu?c ? theo khi ??n lm nghi?m php.  Khng bi cc d?ng kem l?ng, cc d?ng b?t, cc d?ng kem, ho?c cc lo?i d?u ln ng?c tr??c khi lm nghi?m php.  M?c qu?n o r?ng v ?i giy ?i b? tho?i mi.  Khng s? d?ng b?t k? s?n ph?m no c nicotine ho?c thu?c l, ch?ng ha?n nh? thu?c l d?ng ht v thu?c l ?i?n t?, tr??c khi lm nghi?m php 4 ti?ng ho?c theo th?i gian ch? d?n c?a chuyn gia ch?m [place] s?c kh?e. N?u qu v? c?n gip ?? ?? cai thu?c, hy h?i chuyn gia ch?m  s?c kh?e. ?i?u g x?y ra trong qu trnh th?c hi?n th? thu?t?  Nhi?u ?i?n c?c s? ???c g?n ln ng?c qu v?.  Nhi?u dy kim lo?i s? ???c g?n vo cc ?i?n c?c. Nh?ng dy ny s? truy?n xung ?i?n t? tim ??n my ECG. Tim c?a qu v? s? ???c theo di c? lc ngh? ng?i v lc luy?n t?p.  N?u qu v? c?ng ???c  siu m tim ho?c ch?p h?t nhn, cc hnh ?nh tim s? ???c ch?p tr??c v sau khi t?p luy?n.  B?ng qu?n huy?t p s? ???c qu?n quanh cnh tay qu  v? ?? ?o huy?t p trong su?t th?i gian lm nghi?m php. Qu v? s? c?m th?y ch?t v l?ng trong su?t th?i gian lm nghi?m php.  Qu v? s? ?i b? trn my ch?y b? ho?c s? d?ng xe ??p t?i ch?. N?u qu v? khng th? s? d?ng nh?ng d?ng c? ny, qu v? c th? ???c yu c?u dng tay quay ?? quay.  Qu v? s? b?t ??u ? c?p ?? ho?c t?c ?? ch?m trn my t?p luy?n ny. M?c ?? kh c?a vi?c t?p luy?n s? t?ng t? t? ?? nng nh?p tim qu v?. Trong tr??ng h?p dng my ch?y b?, t?c ?? v ?? d?c s? ???c t?ng ln d?n d?n.  Qu v? s? ???c yu c?u th? ??nh k? vo trong m?t chi?c ?ng. Vi?c ny s? ?o cc lo?i kh qu v? th? ra.  Qu v? s? ???c h?i v? c?m gic c?a qu v? trong su?t th?i gian lm nghi?m php. Qu v? s? ???c yu c?u ?nh gi m?c ?? g?ng s?c c?a mnh.  Hy cho nhn vin bi?t ngay n?u qu v? c?m th?y: ? ?au ng?c. ? Chng m?t. ? Kh th?. ? Qu m?t m?i ??n m?c khng th? ti?p t?c. ? ?au ho?c nh?c ? chn ho?c cnh tay.  Qu v? s? t?p luy?n cho ??n khi qu v? c cc tri?u ch?ng ho?c cho ??n khi qu v? ??t ??n nh?p tim m?c tiu. Nghi?m php ny c?ng s? ???c cho d?ng l?i n?u qu v? c cc thay ??i v? ch? s? huy?t p ho?c ch? s? ECG, ho?c n?u qu v? c nh?p tim khng ??u (lo?n nh?p). Th? thu?t ny c th? khc nhau gi?a cc chuyn gia ch?m [place] s?c kh?e v cc b?nh vi?n.   ?i?u g x?y ra sau khi th?c hi?n th? thu?t?  Qu v? s? ng?i xu?ng v h?i ph?c l?i. Huy?t p, nh?p tim v ECG c?a qu v? s? ???c theo di cho ??n khi qu v? ph?c h?i.  Qu v? c th? tr? l?i l?ch trnh bnh th??ng c?a mnh bao g?m ch? ?? ?n, ho?t ??ng, thu?c men, tr? khi chuyn gia ch?m [place] s?c kh?e h??ng d?n khc.  Qu v? ???c ty  l?y k?t qu? ki?m tra. Hy h?i chuyn gia ch?m [place] s?c kh?e ho?c khoa th?c hi?n vi?c ki?m tra khi no c k?t qu? c?a qu v?. Tm t?t  Nghi?m php g?ng s?c l nghi?m php ???c th?c hi?n ?? thu th?p thng tin v? ch?c n?ng tim c?a qu v? ho?t ??ng th? no trong th?i gian t?p luy?n.  Nghi?m php ny ???c th?c hi?n ?? pht  hi?n b?nh ??ng m?ch vnh (CAD).  Trong qu trnh th?c hi?n nghi?m php, qu v? s? ?i b? trn my ch?y b? ho?c s? d?ng xe ??p t?p luy?n ?? nng nh?p tim c?a qu v?.  ?i?u quan tr?ng l ph?i tun th? ch? d?n c?a chuyn gia ch?m [place] s?c kh?e v? cc h?n ch? ?n ho?c u?ng tr??c khi lm nghi?m php. Nh?ng h?n ch? ny bao g?m trnh dng caffeine v m?t s? lo?i thu?c nh?t ??nh tr??c khi lm nghi?m php. Thng tin ny khng nh?m m?c ?ch thay th? cho l?i khuyn m chuyn gia ch?m [place] s?c kh?e ni v?i qu v?. Hy b?o ??m  qu v? ph?i th?o lu?n b?t k? v?n ?? g m qu v? c v?i chuyn gia ch?m [place] s?c kh?e c?a qu v?. Document Revised: 06/04/2020 Document Reviewed: 06/04/2020 Elsevier Patient Education  2021 [employer].

## 2020-12-21 NOTE — Progress Notes (Signed)
Electrophysiology Office Note:    Date:  12/21/2020   ID:  Linda [name], DOB 08-27-1940, MRN [id number]  PCP:  Charlott Rakes, MD  [facility] Cardiologist:  Candee Furbish, MD  [facility] Electrophysiologist:  Vickie Epley, MD   Referring MD: Charlott Rakes, MD   Chief Complaint: Atrial fibrillation  History of Present Illness:    Linda [name] is a 80 y.o. female who presents for an evaluation of atrial fibrillation at the request of Adline Peals, PA-C. Their medical history includes stroke, diabetes, hypertension, tobacco abuse.  The patient last saw Adline Peals in the A. fib clinic on November 08, 2020.  The patient was previously seen by Dr. Caryl Comes but was lost to follow-up in 2018.  She is maintained on Xarelto for stroke prophylaxis.  She wore a ZIO monitor recently which showed a 21% burden of A. fib with rapid ventricular rates.  She was started on metoprolol at the end of 2021 by Dr. Marlou Porch.  She was recently hospitalized at [facility] in February 2022 with hypertensive emergency and pulmonary edema associated with rapidly conducted atrial fibrillation.  During that hospitalization she did receive IV amiodarone.  She underwent a cardioversion on October 28, 2020 but had a early return to atrial fibrillation.  At that visit, they discussed amiodarone.  Today she tells me that she has had several months of exertional chest pressure.  She tells me that she is able to to walk about 5 minutes before the chest pressure starts.  It is reliably started at this point.  The pressure stays in her chest and does not radiate.  Sitting down and resting alleviates the pressure.  No syncope or presyncope.  She feels fine when she is at rest.  The patient was with an in person interpreter during today's visit.  Past Medical History:  Diagnosis Date  . Atrial fibrillation ([place])    dx in setting of stroke 4/12;  echo with EF 60-65%, mild LVH, mild AI, grade 1 diast dysfxn  . Coronary artery  disease   . Diabetes mellitus without complication ([place])   . History of CVA (cerebrovascular accident)    New diagnosis December 08, 2010  . Hx of cardiovascular stress test    a. Myoview 8/12:  EF 69%, no ischemia.   . Hyperlipidemia   . Hypertension   . Impaired glucose tolerance 05/08/2011  . Stroke [facility]) 2012   no deficits  . Tobacco abuse    Ongoing (3-6 cigarettes per day, 25 pack year history)    Past Surgical History:  Procedure Laterality Date  . CARDIOVERSION N/A 10/28/2020   Procedure: CARDIOVERSION;  Surgeon: Donato Heinz, MD;  Location: [facility] ENDOSCOPY;  Service: Cardiovascular;  Laterality: N/A;  . COLONOSCOPY N/A 02/01/2016   Procedure: COLONOSCOPY;  Surgeon: Manus Gunning, MD;  Location: [place];  Service: Gastroenterology;  Laterality: N/A;    Current Medications: Current Meds  Medication Sig  . amiodarone (PACERONE) 200 MG tablet Take 1 tablet (200 mg total) by mouth daily.  Marland Kitchen atorvastatin (LIPITOR) 20 MG tablet Take 20 mg by mouth at bedtime.  . hydrochlorothiazide (MICROZIDE) 12.5 MG capsule Take 1 capsule (12.5 mg total) by mouth daily.  Marland Kitchen lisinopril (ZESTRIL) 5 MG tablet Take 5 mg by mouth daily.  . mirtazapine (REMERON) 15 MG tablet Take 15 mg by mouth at bedtime.  . Multiple Vitamin (MULTIVITAMIN WITH MINERALS) TABS tablet Take 1 tablet by mouth daily.  . rivaroxaban (XARELTO) 20 MG TABS  tablet TAKE 1 TABLET(20 MG) BY MOUTH DAILY WITH SUPPER     Allergies:   Patient has no known allergies.   Social History   Socioeconomic History  . Marital status: Widowed    Spouse name: Not on file  . Number of children: Not on file  . Years of education: Not on file  . Highest education level: Not on file  Occupational History    Employer: [employer]  . Occupation: Not working  Tobacco Use  . Smoking status: Light Tobacco Smoker    Packs/day: 0.25    Years: 25.00    Pack years: 6.25    Types: Cigarettes  . Smokeless tobacco: Never Used   . Tobacco comment: Down to 1 cigarette per day.   Substance and Sexual Activity  . Alcohol use: No  . Drug use: No  . Sexual activity: Not on file  Other Topics Concern  . Not on file  Social History Narrative   Lives in [place] with her daughters   Regular diet   No regular exercise but active and independent with all daily activities   Social Determinants of Health   Financial Resource Strain: Not on file  Food Insecurity: Not on file  Transportation Needs: Not on file  Physical Activity: Not on file  Stress: Not on file  Social Connections: Not on file     Family History: The patient's family history is negative for Coronary artery disease.  ROS:   Please see the history of present illness.    All other systems reviewed and are negative.  EKGs/Labs/Other Studies Reviewed:    The following studies were reviewed today:  August 02, 2020 echo personally reviewed Left ventricular function normal, 60% Mild LVH Right ventricular function normal Moderate aortic regurgitation  July 01, 2020 ZIO 21% burden of atrial fibrillation with average ventricular rate during A. fib of 134 bpm  07/01/2020 labs tsh 1.6 Alt 27 ast 29  EKG:  The ekg ordered today demonstrates sinus rhythm, left atrial enlargement.  Recent Labs: 07/01/2020: ALT 27; TSH 1.630 10/22/2020: BUN 22; Creatinine, Ser 1.00; Hemoglobin 15.3; Platelets 280; Potassium 4.0; Sodium 137  Recent Lipid Panel    Component Value Date/Time   CHOL 153 07/01/2020 1146   TRIG 199 (H) 07/01/2020 1146   HDL 38 (L) 07/01/2020 1146   CHOLHDL 4.0 07/01/2020 1146   CHOLHDL 5.3 10/13/2016 0058   VLDL 20 10/13/2016 0058   LDLCALC 81 07/01/2020 1146   LDLDIRECT 124.5 08/07/2012 1128    Physical Exam:    VS:  BP (!) 146/82   Pulse 64   Ht 5' (1.524 m)   Wt 120 lb 12.8 oz (54.8 kg)   SpO2 98%   BMI 23.59 kg/m     Wt Readings from Last 3 Encounters:  12/21/20 120 lb 12.8 oz (54.8 kg)  11/08/20 117 lb 3.2  oz (53.2 kg)  10/28/20 117 lb 12.8 oz (53.4 kg)     GEN:  Well nourished, well developed in no acute distress HEENT: Normal NECK: No JVD; No carotid bruits LYMPHATICS: No lymphadenopathy CARDIAC: RRR, no murmurs, rubs, gallops RESPIRATORY:  Clear to auscultation without rales, wheezing or rhonchi  ABDOMEN: Soft, non-tender, non-distended MUSCULOSKELETAL:  No edema; No deformity  SKIN: Warm and dry NEUROLOGIC:  Alert and oriented x 3 PSYCHIATRIC:  Normal affect   ASSESSMENT:    1. Persistent atrial fibrillation ([place])   2. Atypical atrial flutter (HCC)   3. Primary hypertension   4. Encounter for  long-term (current) use of high-risk medication   5. Precordial pain    PLAN:    In order of problems listed above:  1. Persistent atrial fibrillation Symptomatic.  Maintaining sinus rhythm on amiodarone.  Previously on sotalol.  On Xarelto for stroke prophylaxis. On amiodarone.  Previous lab work in November showed stable TSH and liver enzymes.  We will plan to repeat CMP, TSH and free T4 today.  2.  Hypertension Recent hospitalization for hypertensive emergency.  3.  Precordial chest pressure Exertional.  Concerning for ischemic disease.  We will plan for an exercise nuclear stress test.  Follow-up 6 to 8 weeks or sooner as needed.   Medication Adjustments/Labs and Tests Ordered: Current medicines are reviewed at length with the patient today.  Concerns regarding medicines are outlined above.  Orders Placed This Encounter  Procedures  . Comp Met (CMET)  . TSH  . T4, free  . Myocardial Perfusion Imaging  . EKG 12-Lead   No orders of the defined types were placed in this encounter.    Signed, Hilton Cork. Quentin Ore, MD, [facility], [facility] 12/21/2020 9:57 AM    Electrophysiology [place]

## 2020-12-29 ENCOUNTER — Telehealth: Payer: Self-pay

## 2020-12-29 NOTE — Telephone Encounter (Signed)
Detailed instructions left on the patient's answering machine. Asked to call back with any questions. S.Anhad Sheeley EMTP 

## 2020-12-30 ENCOUNTER — Ambulatory Visit (HOSPITAL_COMMUNITY): Payer: Medicare HMO | Attending: Cardiology

## 2020-12-30 ENCOUNTER — Other Ambulatory Visit: Payer: Self-pay

## 2020-12-30 DIAGNOSIS — R072 Precordial pain: Secondary | ICD-10-CM | POA: Insufficient documentation

## 2020-12-30 LAB — MYOCARDIAL PERFUSION IMAGING
LV dias vol: 78 mL (ref 46–106)
LV sys vol: 31 mL
Peak HR: 82 {beats}/min
Rest HR: 67 {beats}/min
SDS: 3
SRS: 0
SSS: 3
TID: 1.12

## 2020-12-30 MED ORDER — TECHNETIUM TC 99M TETROFOSMIN IV KIT
31.6000 | PACK | Freq: Once | INTRAVENOUS | Status: AC | PRN
  Administered 2020-12-30: 31.6 via INTRAVENOUS
  Filled 2020-12-30: qty 32

## 2020-12-30 MED ORDER — TECHNETIUM TC 99M TETROFOSMIN IV KIT
10.8000 | PACK | Freq: Once | INTRAVENOUS | Status: AC | PRN
  Administered 2020-12-30: 10.8 via INTRAVENOUS
  Filled 2020-12-30: qty 11

## 2020-12-30 MED ORDER — REGADENOSON 0.4 MG/5ML IV SOLN
0.4000 mg | Freq: Once | INTRAVENOUS | Status: AC
  Administered 2020-12-30: 0.4 mg via INTRAVENOUS

## 2021-02-10 NOTE — Progress Notes (Deleted)
Electrophysiology Office Follow up Visit Note:    Date:  02/10/2021   ID:  Linda [name], DOB August 25, 1940, MRN [id number]  PCP:  [name], MD  [facility] Cardiologist:  [name], MD  [facility] Electrophysiologist:  Lanier Prude, MD    Interval History:    Linda [name] is a 80 y.o. female who presents for a follow up visit.  I last saw the patient Dec 21, 2020 for persistent atrial fibrillation.  She maintains sinus rhythm on amiodarone.  She is using Xarelto for stroke prophylaxis.  At that appointment she reported exertional chest pressure concerning for ischemic disease so a stress test was ordered.  The stress test was low risk for significant coronary artery disease.     Past Medical History:  Diagnosis Date   Atrial fibrillation (HCC)    dx in setting of stroke 4/12;  echo with EF 60-65%, mild LVH, mild AI, grade 1 diast dysfxn   Coronary artery disease    Diabetes mellitus without complication (HCC)    History of CVA (cerebrovascular accident)    New diagnosis December 08, 2010   Hx of cardiovascular stress test    a. Myoview 8/12:  EF 69%, no ischemia.    Hyperlipidemia    Hypertension    Impaired glucose tolerance 05/08/2011   Stroke (HCC) 2012   no deficits   Tobacco abuse    Ongoing (3-6 cigarettes per day, 25 pack year history)    Past Surgical History:  Procedure Laterality Date   CARDIOVERSION N/A 10/28/2020   Procedure: CARDIOVERSION;  Surgeon: [name], MD;  Location: [facility] ENDOSCOPY;  Service: Cardiovascular;  Laterality: N/A;   COLONOSCOPY N/A 02/01/2016   Procedure: COLONOSCOPY;  Surgeon: Ruffin Frederick, MD;  Location: [facility] ENDOSCOPY;  Service: Gastroenterology;  Laterality: N/A;    Current Medications: No outpatient medications have been marked as taking for the 02/11/21 encounter (Appointment) with Lanier Prude, MD.     Allergies:   Patient has no known allergies.   Social History   Socioeconomic History    Marital status: Widowed    Spouse name: Not on file   Number of children: Not on file   Years of education: Not on file   Highest education level: Not on file  Occupational History    Employer: [employer]   Occupation: Not working  Tobacco Use   Smoking status: Light Smoker    Packs/day: 0.25    Years: 25.00    Pack years: 6.25    Types: Cigarettes   Smokeless tobacco: Never   Tobacco comments:    Down to 1 cigarette per day.   Substance and Sexual Activity   Alcohol use: No   Drug use: No   Sexual activity: Not on file  Other Topics Concern   Not on file  Social History Narrative   Lives in [place] with her daughters   Regular diet   No regular exercise but active and independent with all daily activities   Social Determinants of Health   Financial Resource Strain: Not on file  Food Insecurity: Not on file  Transportation Needs: Not on file  Physical Activity: Not on file  Stress: Not on file  Social Connections: Not on file     Family History: The patient's family history is negative for Coronary artery disease.  ROS:   Please see the history of present illness.    All other systems reviewed and are negative.  EKGs/Labs/Other Studies Reviewed:  The following studies were reviewed today:  Dec 30, 2020 SPECT The left ventricular ejection fraction is normal (55-65%). Nuclear stress EF: 60%. There was no ST segment deviation noted during stress. There is a small defect of moderate severity present in the apical inferior and apex location. The defect is partially reversible. There is significant extracardiac activity at near the apex on both rest and stress images. The defect is most consistent with extracardiac activity near the apex. In the setting of normal LVF, scar with peri-infarct ischemia is less likely. This is a low risk study.   January 19, 2021 left heart cath at [facility] ([facility]) ARTERIOGRAM: The Coronary arteries showed diffuse mild to moderate disease,  with the most significant lesion appearing to be in the distal right coronary artery prior to the PDA bifurcation. LEFT VENTRICULOGRAM: Left ventriculography was not done, EDP normal PCI: IFR done across the lesion in the distal right coronary was 0.93 Complications: None RECOMMENDATION:  Medical therapy for Coronary artery disease risk factor reduction and Angina   EKG:  The ekg ordered today demonstrates ***  Recent Labs: 10/22/2020: Hemoglobin 15.3; Platelets 280 12/21/2020: ALT 30; BUN 16; Creatinine, Ser 1.02; Potassium 4.0; Sodium 140; TSH 3.660  Recent Lipid Panel    Component Value Date/Time   CHOL 153 07/01/2020 1146   TRIG 199 (H) 07/01/2020 1146   HDL 38 (L) 07/01/2020 1146   CHOLHDL 4.0 07/01/2020 1146   CHOLHDL 5.3 10/13/2016 0058   VLDL 20 10/13/2016 0058   LDLCALC 81 07/01/2020 1146   LDLDIRECT 124.5 08/07/2012 1128    Physical Exam:    VS:  There were no vitals taken for this visit.    Wt Readings from Last 3 Encounters:  12/30/20 120 lb (54.4 kg)  12/21/20 120 lb 12.8 oz (54.8 kg)  11/08/20 117 lb 3.2 oz (53.2 kg)     GEN: *** Well nourished, well developed in no acute distress HEENT: Normal NECK: No JVD; No carotid bruits LYMPHATICS: No lymphadenopathy CARDIAC: ***RRR, no murmurs, rubs, gallops RESPIRATORY:  Clear to auscultation without rales, wheezing or rhonchi  ABDOMEN: Soft, non-tender, non-distended MUSCULOSKELETAL:  No edema; No deformity  SKIN: Warm and dry NEUROLOGIC:  Alert and oriented x 3 PSYCHIATRIC:  Normal affect   ASSESSMENT:    1. Persistent atrial fibrillation (HCC)   2. Encounter for long-term (current) use of high-risk medication   3. Primary hypertension   4. Coronary artery disease involving native heart without angina pectoris, unspecified vessel or lesion type    PLAN:    In order of problems listed above:  1. Persistent atrial fibrillation (HCC)   2. Encounter for long-term (current) use of high-risk medication   3.  Primary hypertension   4. Coronary artery disease involving native heart without angina pectoris, unspecified vessel or lesion type    Total time spent with patient today *** minutes. This includes reviewing records, evaluating the patient and coordinating care.   Medication Adjustments/Labs and Tests Ordered: Current medicines are reviewed at length with the patient today.  Concerns regarding medicines are outlined above.  No orders of the defined types were placed in this encounter.  No orders of the defined types were placed in this encounter.    Signed, Steffanie Dunn, MD, [facility], [facility] 02/10/2021 10:39 PM    Electrophysiology [facility]

## 2021-02-11 ENCOUNTER — Ambulatory Visit: Payer: Medicare HMO | Admitting: Cardiology

## 2021-02-11 DIAGNOSIS — I2510 Atherosclerotic heart disease of native coronary artery without angina pectoris: Secondary | ICD-10-CM

## 2021-02-11 DIAGNOSIS — I4819 Other persistent atrial fibrillation: Secondary | ICD-10-CM

## 2021-02-11 DIAGNOSIS — I10 Essential (primary) hypertension: Secondary | ICD-10-CM

## 2021-02-11 DIAGNOSIS — Z79899 Other long term (current) drug therapy: Secondary | ICD-10-CM

## 2021-05-02 ENCOUNTER — Other Ambulatory Visit: Payer: Self-pay | Admitting: Thoracic Surgery (Cardiothoracic Vascular Surgery)

## 2021-05-02 DIAGNOSIS — I712 Thoracic aortic aneurysm, without rupture, unspecified: Secondary | ICD-10-CM

## 2021-06-02 ENCOUNTER — Ambulatory Visit
Admission: RE | Admit: 2021-06-02 | Discharge: 2021-06-02 | Disposition: A | Discharge status: Home / Self Care | Payer: Medicare HMO | Source: Ambulatory Visit | Attending: Thoracic Surgery (Cardiothoracic Vascular Surgery) | Admitting: Thoracic Surgery (Cardiothoracic Vascular Surgery)

## 2021-06-02 DIAGNOSIS — I712 Thoracic aortic aneurysm, without rupture, unspecified: Secondary | ICD-10-CM

## 2021-06-02 MED ORDER — IOPAMIDOL (ISOVUE-370) INJECTION 76%
75.0000 mL | Freq: Once | INTRAVENOUS | Status: AC | PRN
  Administered 2021-06-02: 75 mL via INTRAVENOUS

## 2021-06-07 ENCOUNTER — Ambulatory Visit (INDEPENDENT_AMBULATORY_CARE_PROVIDER_SITE_OTHER): Payer: Medicare HMO | Admitting: Physician Assistant

## 2021-06-07 ENCOUNTER — Encounter: Payer: Self-pay | Admitting: Physician Assistant

## 2021-06-07 ENCOUNTER — Other Ambulatory Visit: Payer: Self-pay

## 2021-06-07 VITALS — BP 160/88 | HR 69 | Resp 20 | Ht 60.0 in | Wt 117.4 lb

## 2021-06-07 DIAGNOSIS — I712 Thoracic aortic aneurysm, without rupture, unspecified: Secondary | ICD-10-CM

## 2021-06-07 NOTE — Progress Notes (Signed)
[address]       Linda [name] [address]             [phone]     HPI: Linda [name] returns for follow-up of her ascending aneurysm  She is accompanied by a professional interpreter  Linda [name] is a 80 year old [place] woman with a past history significant for chronic atrial fibrillation, embolic stroke, hypertension, moderate aortic insufficiency, hyperlipidemia, aortic atherosclerosis, ascending aortic aneurysm, coronary artery disease, type 2 diabetes, and tobacco abuse.  Her aneurysm was first found in 2018.  We have followed her since then.  She has intermittent chest pain that last a few seconds but it goes away on its own. She has had it for the last few months. The pain is intermittent for a a few days and then none for 3 weeks. The pain is sharp and goes away with a deep breath.   She had been a heavy smoker for 70 years with 4 cigarettes a day. She quit 2 weeks ago. She has weak legs therefore she hasn't done much walking.   Past Medical History:  Diagnosis Date   Atrial fibrillation (HCC)    dx in setting of stroke 4/12;  echo with EF 60-65%, mild LVH, mild AI, grade 1 diast dysfxn   Coronary artery disease    Diabetes mellitus without complication (HCC)    History of CVA (cerebrovascular accident)    New diagnosis December 08, 2010   Hx of cardiovascular stress test    a. Myoview 8/12:  EF 69%, no ischemia.    Hyperlipidemia    Hypertension    Impaired glucose tolerance 05/08/2011   Stroke (HCC) 2012   no deficits   Tobacco abuse    Ongoing (3-6 cigarettes per day, 25 pack year history)    Current Outpatient Medications on File Prior to Visit  Medication Sig Dispense Refill   amiodarone (PACERONE) 200 MG tablet Take 1 tablet (200 mg total) by mouth daily. 30 tablet 3   atorvastatin (LIPITOR) 20 MG tablet Take 20 mg by mouth at bedtime.     lisinopril (ZESTRIL) 5 MG tablet Take 20 mg by mouth daily.     mirtazapine (REMERON) 15 MG tablet Take 15  mg by mouth at bedtime.     rivaroxaban (XARELTO) 20 MG TABS tablet TAKE 1 TABLET(20 MG) BY MOUTH DAILY WITH SUPPER 30 tablet 6   hydrochlorothiazide (MICROZIDE) 12.5 MG capsule Take 1 capsule (12.5 mg total) by mouth daily. (Patient not taking: Reported on 06/07/2021) 90 capsule 2   Multiple Vitamin (MULTIVITAMIN WITH MINERALS) TABS tablet Take 1 tablet by mouth daily. (Patient not taking: Reported on 06/07/2021)     No current facility-administered medications on file prior to visit.    Vitals:   06/07/21 1141  BP: (!) 160/88  Pulse: 69  Resp: 20  SpO2: 97%     Physical Exam  Elderly Asian woman in no acute distress cardiac irregularly irregular lungs clear bilaterally no peripheral edema  Diagnostic Tests:  CLINICAL DATA:  Follow-up thoracic aortic aneurysm   EXAM: CT ANGIOGRAPHY CHEST WITH CONTRAST   TECHNIQUE: Multidetector CT imaging of the chest was performed using the standard protocol during bolus administration of intravenous contrast. Multiplanar CT image reconstructions and MIPs were obtained to evaluate the vascular anatomy.   CONTRAST:  50mL ISOVUE-370 IOPAMIDOL (ISOVUE-370) INJECTION 76%   COMPARISON:  04/24/2020   FINDINGS: Cardiovascular: Preferential opacification of the thoracic aorta. Maximum caliber of the tubular  ascending thoracic aorta is 3.8 x 3.8 cm, unchanged. The sinuses of Valsalva measure up to 3.4 cm in caliber. The aortic valve measures up to 2.1 cm. The descending thoracic aorta measures up to 2.2 x 2.2 cm. Moderate mixed aortic atherosclerosis. Normal heart size. No pericardial effusion.   Mediastinum/Nodes: No enlarged mediastinal, hilar, or axillary lymph nodes. Thyroid gland, trachea, and esophagus demonstrate no significant findings.   Lungs/Pleura: Minimal paraseptal emphysema. Background of very fine centrilobular nodularity, most concentrated in the lung apices. No pleural effusion or pneumothorax.   Upper Abdomen: No  acute abnormality. Small gallstones in the gallbladder.   Musculoskeletal: No chest wall abnormality. No acute or significant osseous findings.   Review of the MIP images confirms the above findings.   IMPRESSION: 1. Maximum caliber of the tubular ascending thoracic aorta is 3.8 x 3.8 cm, unchanged. Moderate mixed aortic atherosclerosis. 2. Minimal paraseptal emphysema. 3. Background of very fine centrilobular nodularity, most concentrated in the lung apices, most commonly seen in smoking-related respiratory bronchiolitis. 4. Cholelithiasis.   Aortic Atherosclerosis (ICD10-I70.0) and Emphysema (ICD10-J43.9).     Electronically Signed   By: Jearld Lesch M.D.   On: 06/02/2021 10:31   CT ANGIOGRAPHY CHEST WITH CONTRAST   TECHNIQUE: Multidetector CT imaging of the chest was performed using the standard protocol during bolus administration of intravenous contrast. Multiplanar CT image reconstructions and MIPs were obtained to evaluate the vascular anatomy.   CONTRAST:  14mL ISOVUE-370 IOPAMIDOL (ISOVUE-370) INJECTION 76%   COMPARISON:  03/25/2019   FINDINGS: Cardiovascular: Scattered aortic atherosclerosis. Maximum ascending thoracic aortic diameter 3.8 cm. Heart is normal size. No filling defects in the pulmonary arteries to suggest pulmonary emboli.   Mediastinum/Nodes: No mediastinal, hilar, or axillary adenopathy. Trachea and esophagus are unremarkable. Thyroid unremarkable.   Lungs/Pleura: Lungs are clear. No focal airspace opacities or suspicious nodules. No effusions. Previously seen 3 mm nodule in the superior segment of the left lower lobe no longer visualized.   Upper Abdomen: Small layering gallstones within the gallbladder.   Musculoskeletal: Chest wall soft tissues are unremarkable. No acute bony abnormality.   Review of the MIP images confirms the above findings.   IMPRESSION: No measurable thoracic aortic aneurysm currently. Maximum diameter of the  ascending thoracic aorta 3.8 cm.   No acute cardiopulmonary disease.   Aortic Atherosclerosis (ICD10-I70.0).   Cholelithiasis.     Electronically Signed   By: Charlett Nose M.D.   On: 05/04/2020 12:12    Impression:  Linda [name] is a 80 year old Asian woman with a past history significant for chronic atrial fibrillation, embolic stroke, hypertension, hyperlipidemia, aortic atherosclerosis, ascending aortic aneurysm, coronary artery disease, type 2 diabetes, and tobacco abuse.   Thoracic aortic atherosclerosis/ascending aneurysm- 3.8 cm which is stable over time.  She needs continued annual follow-up.  Blood pressure control is the mainstay of treatment.  Chest pain-will refer to cardiology.  She does have a history of CAD.  Chronic atrial fibrillation-she is on Xarelto and amiodarone.  She is seen in the A. fib clinic.  Plan: Follow-up with cardiology Follow-up with the atrial fibrillation clinic return in 1 year with CT angio chest for aortic aneurysm surveillance  Jari Favre, PA-C [facility] [phone]

## 2021-09-28 ENCOUNTER — Other Ambulatory Visit (HOSPITAL_COMMUNITY): Payer: Self-pay | Admitting: Physician Assistant

## 2022-01-24 ENCOUNTER — Other Ambulatory Visit (HOSPITAL_COMMUNITY): Payer: Self-pay

## 2022-01-24 MED ORDER — RIVAROXABAN 20 MG PO TABS: ORAL_TABLET | ORAL | 0 refills | Status: DC

## 2022-02-20 ENCOUNTER — Other Ambulatory Visit (HOSPITAL_COMMUNITY): Payer: Self-pay | Admitting: Physician Assistant

## 2022-02-20 DIAGNOSIS — I4819 Other persistent atrial fibrillation: Secondary | ICD-10-CM

## 2022-02-20 NOTE — Telephone Encounter (Addendum)
Xarelto 20mg  refill request received. Pt is 81 years old, weight-53.3kg, Crea-1.05 on 03/16/2021 from [facility]-WILL NEEDS UPDATED LABS, last seen by Dr. [name] on 12/21/2020-NEEDS AN APPT, Diagnosis-Afib, CrCl-37.58ml/min.   PT NEEDS AN APPT AND LABS, SENDING MSG TO SCHEDULERS.   Schedulers called pt and there was no answer so they left a message.   02/22/22-left message for pt to call back regarding an appt.  02/23/22-left another message for pt to call back regarding labs and an appt, called grandson's number and had to leave a message as well. Pt has an appt with Francis Dowse on 03/03/2022. Dose is inappropriate based on dosing criteria. Discussed doseage with Thayer Ohm, PharmD and Xarelto dose will be reduced to 15mg  from 20mg  per dosing dosing criteria at this time. Called to update pt but no answer so sent in new refill.

## 2022-02-23 MED ORDER — RIVAROXABAN 15 MG PO TABS: 15.0000 mg | ORAL_TABLET | Freq: Every day | ORAL | 0 refills | Status: DC

## 2022-03-03 ENCOUNTER — Encounter: Payer: Self-pay | Admitting: Physician Assistant

## 2022-03-03 ENCOUNTER — Ambulatory Visit (INDEPENDENT_AMBULATORY_CARE_PROVIDER_SITE_OTHER): Payer: Medicare Other | Admitting: Physician Assistant

## 2022-03-03 VITALS — BP 170/90 | HR 105 | Ht 61.0 in | Wt 117.8 lb

## 2022-03-03 DIAGNOSIS — E785 Hyperlipidemia, unspecified: Secondary | ICD-10-CM

## 2022-03-03 DIAGNOSIS — I10 Essential (primary) hypertension: Secondary | ICD-10-CM | POA: Diagnosis not present

## 2022-03-03 DIAGNOSIS — I4819 Other persistent atrial fibrillation: Secondary | ICD-10-CM

## 2022-03-03 MED ORDER — CARVEDILOL 12.5 MG PO TABS: 12.5000 mg | ORAL_TABLET | Freq: Two times a day (BID) | ORAL | 3 refills | Status: DC

## 2022-03-03 MED ORDER — RIVAROXABAN 15 MG PO TABS: 15.0000 mg | ORAL_TABLET | Freq: Every day | ORAL | 3 refills | Status: DC

## 2022-03-03 MED ORDER — LISINOPRIL 20 MG PO TABS: 20.0000 mg | ORAL_TABLET | Freq: Every day | ORAL | 3 refills | Status: DC

## 2022-03-03 MED ORDER — ATORVASTATIN CALCIUM 40 MG PO TABS: 40.0000 mg | ORAL_TABLET | Freq: Every day | ORAL | 3 refills | Status: DC

## 2022-03-03 MED ORDER — AMIODARONE HCL 200 MG PO TABS: 200.0000 mg | ORAL_TABLET | Freq: Every day | ORAL | 3 refills | Status: DC

## 2022-03-03 NOTE — Patient Instructions (Signed)
Medication Instructions:  Your physician has recommended you make the following change in your medication:   Amiodarone 200mg  daily Atorvastatin 40mg  daily Carvedilol 12.5mg  twice daily Xarelto 15mg  daily Lisinopril 20mg  daily  *If you need a refill on your cardiac medications before your next appointment, please call your pharmacy*   Lab Work: TODAY: CMET, CBC, TSH  If you have labs (blood work) drawn today and your tests are completely normal, you will receive your results only by: MyChart Message (if you have MyChart) OR A paper copy in the mail If you have any lab test that is abnormal or we need to change your treatment, we will call you to review the results.    Follow-Up: At [facility], you and your health needs are our priority.  As part of our continuing mission to provide you with exceptional heart care, we have created designated Provider Care Teams.  These Care Teams include your primary Cardiologist (physician) and Advanced Practice Providers (APPs -  Physician Assistants and Nurse Practitioners) who all work together to provide you with the care you need, when you need it.   Your next appointment:   03/17/2022

## 2022-03-03 NOTE — Progress Notes (Signed)
Cardiology Office Note Date:  03/03/2022  Patient ID:  Linda [name], Linda [name] 09/18/1940, MRN [id number] PCP:  [name], MD  Cardiologist:  [name] Electrophysiologist: Dr. [name]    Chief Complaint: over due  History of Present Illness: Linda ANTONOPOULOS is a 81 y.o. female with history of DM, stroke, AFib, HTN, Ascending Ao aneurysm (following with CTS)  She saw [name] last 2021, doing ok, had been LTF a few years then, meds adjusted  F/u with the afib clinic since then and eventually referred to Dr. [name] after a hospitalization at [facility] with CHF/flash Pulm edema associated with RVR  She saw [name] 12/21/21, she reported exertional CP,, maintaining SR, planned for stress testing  Stress was ok, low risk   Admitted to [facility] 01/17/21 - 01/20/21 with CP, SOB, hypertensive emergency 223/101, required BIPAP, treated with NTG gtt, diuresis.  BP meds adjusted, had a cath with non-obstructive disease, TTE noted LVEF 55% by limited echo done.  Admitted again 723/22 - 03/16/21, AGAIN, BP ooc, hypertensive emergency, respiratory distress 187/113, BIPAP, NTG gtt, reported medication compliant but had poor knowledge of her meds. Cardiology called, recommended adding Imdur for reports of CP and stopping remeron 2/2 prolonged QT Discussed some bradycardia her metoprolol held though d/c summary mentions perhaps resuming ? Outpt Instructed to hold her metoprolol and HCTZ until seen by cardiology outpt  Today's visit is done with [name] Her grandson is also present, [place].  TODAY She has palpitations is aware of her heart beat, perhaps a little SOB  She ran out of all her medicines except: lisinopril 20mg  daily and Xarelto 20mg  daily  She brings empty bottles of coreg 12.5mg  BID and atorvastatin 40mg  daily As far as I can tell Has not been on amiodarone, HCTZ, mirtazapine, MVit for unknown duration  No near syncope or syncope. She reports good compliance with  the medicines until she ran out and could not get refills She reports good compliance with her lisinopril and xarelto that she does still have.  No problems with bleeding, or signs of bleeding     AFib/AAD Hx Diagnosed quite remotely Propafenone, sotalol, details unclear Amiodarone started Feb 2022   Past Medical History:  Diagnosis Date   Atrial fibrillation (HCC)    dx in setting of stroke 4/12;  echo with EF 60-65%, mild LVH, mild AI, grade 1 diast dysfxn   Coronary artery disease    Diabetes mellitus without complication (HCC)    History of CVA (cerebrovascular accident)    New diagnosis December 08, 2010   Hx of cardiovascular stress test    a. Myoview 8/12:  EF 69%, no ischemia.    Hyperlipidemia    Hypertension    Impaired glucose tolerance 05/08/2011   Stroke (HCC) 2012   no deficits   Tobacco abuse    Ongoing (3-6 cigarettes per day, 25 pack year history)    Past Surgical History:  Procedure Laterality Date   CARDIOVERSION N/A 10/28/2020   Procedure: CARDIOVERSION;  Surgeon: 05/10/2011, MD;  Location: [facility] ENDOSCOPY;  Service: Cardiovascular;  Laterality: N/A;   COLONOSCOPY N/A 02/01/2016   Procedure: COLONOSCOPY;  Surgeon: [name], MD;  Location: [facility] ENDOSCOPY;  Service: Gastroenterology;  Laterality: N/A;    Current Outpatient Medications  Medication Sig Dispense Refill   amiodarone (PACERONE) 200 MG tablet Take 1 tablet (200 mg total) by mouth daily. 30 tablet 3   atorvastatin (LIPITOR) 20 MG tablet Take 20 mg by  mouth at bedtime.     hydrochlorothiazide (MICROZIDE) 12.5 MG capsule Take 1 capsule (12.5 mg total) by mouth daily. (Patient not taking: Reported on 06/07/2021) 90 capsule 2   lisinopril (ZESTRIL) 5 MG tablet Take 20 mg by mouth daily.     mirtazapine (REMERON) 15 MG tablet Take 15 mg by mouth at bedtime.     Multiple Vitamin (MULTIVITAMIN WITH MINERALS) TABS tablet Take 1 tablet by mouth daily. (Patient not taking: Reported on  06/07/2021)     Rivaroxaban (XARELTO) 15 MG TABS tablet Take 1 tablet (15 mg total) by mouth daily with supper. Please keep upcoming appt for refills. 30 tablet 0   No current facility-administered medications for this visit.    Allergies:   Patient has no known allergies.   Social History:  The patient  reports that she has been smoking cigarettes. She has a 6.25 pack-year smoking history. She has never used smokeless tobacco. She reports that she does not drink alcohol and does not use drugs.   Family History:  The patient's family history is not on file.  ROS:  Please see the history of present illness.    All other systems are reviewed and otherwise negative.   PHYSICAL EXAM:  VS:  There were no vitals taken for this visit. BMI: There is no height or weight on file to calculate BMI. Well nourished, well developed, in no acute distress HEENT: normocephalic, atraumatic Neck: no JVD, carotid bruits or masses Cardiac:  irreg-irreg; no significant murmurs, no rubs, or gallops Lungs:  CTA b/l, no wheezing, rhonchi or rales Abd: soft, nontender MS: no deformity, age appropriate atrophy Ext: no edema Skin: warm and dry, no rash Neuro:  No gross deficits appreciated Psych: euthymic mood, full affect   EKG:  Done today and reviewed by myself shows  AFib 105bpm  01/18/21; limited echo SUMMARY  LAE, other chamber sizes normal  LVH with apex particularly hypertrophied; EF 55% without segmental  abnormality  mild to moderate AI; 3 leaflet valve  mild TR  no effusion  proximal ascending aorta 46mm, enlarged when indexed.  Global LV systolic function is preserved   01/19/21: [facility] RECOMMENDATION:   Medical therapy for Coronary artery disease risk factor reduction and  Angina  Coronary Findings Diagnostic Dominance: Right  Left Main: Mid LM to Dist LM lesion is 30% stenosed.  Left Anterior Descending: Ost LAD to Prox LAD lesion is 20% stenosed. Mid LAD lesion is 15% stenosed. First  Diagonal Branch: 1st Diag lesion is 30% stenosed.  Left Circumflex: Mid Cx lesion is 15% stenosed.  Right Coronary Artery: Prox RCA lesion is 35% stenosed. Dist RCA lesion is 50% stenosed.   12/30/20; stress myoview The left ventricular ejection fraction is normal (55-65%). Nuclear stress EF: 60%. There was no ST segment deviation noted during stress. There is a small defect of moderate severity present in the apical inferior and apex location. The defect is partially reversible. There is significant extracardiac activity at near the apex on both rest and stress images. The defect is most consistent with extracardiac activity near the apex. In the setting of normal LVF, scar with peri-infarct ischemia is less likely. This is a low risk study.    August 02, 2020 echo Left ventricular function normal, 60% Mild LVH Right ventricular function normal Moderate aortic regurgitation   July 01, 2020 ZIO 21% burden of atrial fibrillation with average ventricular rate during A. fib of 134 bpm   Recent Labs: No results found for  requested labs within last 365 days.  No results found for requested labs within last 365 days.   CrCl cannot be calculated (Patient's most recent lab result is older than the maximum 21 days allowed.).   Wt Readings from Last 3 Encounters:  06/07/21 117 lb 6.4 oz (53.3 kg)  12/30/20 120 lb (54.4 kg)  12/21/20 120 lb 12.8 oz (54.8 kg)     Other studies reviewed: Additional studies/records reviewed today include: summarized above  ASSESSMENT AND PLAN:  Persistent Afib CHA2DS2Vasc is 7, on Xarelto, based on her most recent labs available, her last Creat 1.05 and today's weight/age, her creat Cl is in the 30's.   This warrants a 15mg  dose  Resume amiodarone 200mg  daily Coreg 12.5mg  BID  HTN Resume coreg as above I have asked she monitor her BP  3. HLD Refill her lipitor  LABS today   Disposition: F/u with me in 2 weeks, once back on amio plan  for DCCV .    Current medicines are reviewed at length with the patient today.  The patient did not have any concerns regarding medicines.  , PA-C 03/03/2022 5:42 AM     [facility] [address] [place] [phone] (office)  [phone] (fax)

## 2022-03-04 LAB — COMPREHENSIVE METABOLIC PANEL
ALT: 20 IU/L (ref 0–32)
AST: 18 IU/L (ref 0–40)
Albumin/Globulin Ratio: 1.6 (ref 1.2–2.2)
Albumin: 4.5 g/dL (ref 3.8–4.8)
Alkaline Phosphatase: 93 IU/L (ref 44–121)
BUN/Creatinine Ratio: 21 (ref 12–28)
BUN: 22 mg/dL (ref 8–27)
Bilirubin Total: 0.3 mg/dL (ref 0.0–1.2)
CO2: 25 mmol/L (ref 20–29)
Calcium: 9.7 mg/dL (ref 8.7–10.3)
Chloride: 103 mmol/L (ref 96–106)
Creatinine, Ser: 1.04 mg/dL — ABNORMAL HIGH (ref 0.57–1.00)
Globulin, Total: 2.8 g/dL (ref 1.5–4.5)
Glucose: 100 mg/dL — ABNORMAL HIGH (ref 70–99)
Potassium: 4.2 mmol/L (ref 3.5–5.2)
Sodium: 144 mmol/L (ref 134–144)
Total Protein: 7.3 g/dL (ref 6.0–8.5)
eGFR: 54 mL/min/{1.73_m2} — ABNORMAL LOW (ref >59)

## 2022-03-04 LAB — CBC
Hematocrit: 51.2 % — ABNORMAL HIGH (ref 34.0–46.6)
Hemoglobin: 16.7 g/dL — ABNORMAL HIGH (ref 11.1–15.9)
MCH: 29.5 pg (ref 26.6–33.0)
MCHC: 32.6 g/dL (ref 31.5–35.7)
MCV: 90 fL (ref 79–97)
Platelets: 288 10*3/uL (ref 150–450)
RBC: 5.67 x10E6/uL — ABNORMAL HIGH (ref 3.77–5.28)
RDW: 12.5 % (ref 11.7–15.4)
WBC: 9.4 10*3/uL (ref 3.4–10.8)

## 2022-03-04 LAB — TSH: TSH: 3.6 u[IU]/mL (ref 0.450–4.500)

## 2022-03-17 ENCOUNTER — Ambulatory Visit (INDEPENDENT_AMBULATORY_CARE_PROVIDER_SITE_OTHER): Payer: Medicare Other | Admitting: Physician Assistant

## 2022-03-17 ENCOUNTER — Encounter: Payer: Self-pay | Admitting: Physician Assistant

## 2022-03-17 VITALS — BP 164/80 | HR 45 | Ht 62.0 in | Wt 120.0 lb

## 2022-03-17 DIAGNOSIS — I10 Essential (primary) hypertension: Secondary | ICD-10-CM | POA: Diagnosis not present

## 2022-03-17 DIAGNOSIS — I498 Other specified cardiac arrhythmias: Secondary | ICD-10-CM

## 2022-03-17 DIAGNOSIS — I4819 Other persistent atrial fibrillation: Secondary | ICD-10-CM | POA: Diagnosis not present

## 2022-03-17 MED ORDER — CARVEDILOL 3.125 MG PO TABS: 3.1250 mg | ORAL_TABLET | Freq: Two times a day (BID) | ORAL | 3 refills | Status: DC

## 2022-03-17 MED ORDER — LISINOPRIL 40 MG PO TABS: 40.0000 mg | ORAL_TABLET | Freq: Every day | ORAL | 3 refills | Status: DC

## 2022-03-17 NOTE — Patient Instructions (Signed)
Medication Instructions:  Your physician has recommended you make the following change in your medication:   DECREASE: Carvedilol to 3.125mg  twice daily INCREASE: Lisinopril to 40mg  daily  *If you need a refill on your cardiac medications before your next appointment, please call your pharmacy*   Lab Work: None If you have labs (blood work) drawn today and your tests are completely normal, you will receive your results only by: MyChart Message (if you have MyChart) OR A paper copy in the mail If you have any lab test that is abnormal or we need to change your treatment, we will call you to review the results.   Follow-Up: At [facility], you and your health needs are our priority.  As part of our continuing mission to provide you with exceptional heart care, we have created designated Provider Care Teams.  These Care Teams include your primary Cardiologist (physician) and Advanced Practice Providers (APPs -  Physician Assistants and Nurse Practitioners) who all work together to provide you with the care you need, when you need it.  Your next appointment:   3-4 week(s)  The format for your next appointment:   In Person  Provider:   You may see [facility], MD or one of the following Advanced Practice Providers on your designated Care Team:   Lanier Prude, Francis Dowse

## 2022-03-17 NOTE — Progress Notes (Signed)
Cardiology Office Note Date:  03/17/2022  Patient ID:  Linda [name], Linda [name] 1941-01-28, MRN [id number] PCP:  Charlott Rakes, MD  Cardiologist:  Dr. Marlou Porch Electrophysiologist: Dr. Quentin Ore    Chief Complaint:  planned f/u  History of Present Illness: Linda [name] is a 81 y.o. female with history of DM, stroke, AFib, HTN, Ascending Ao aneurysm (following with CTS)  She saw Dr. Marlou Porch last 2021, doing ok, had been LTF a few years then, meds adjusted  F/u with the afib clinic since then and eventually referred to Dr. Quentin Ore after a hospitalization at [facility] with CHF/flash Pulm edema associated with RVR  She saw D. Quentin Ore 12/21/21, she reported exertional CP,, maintaining SR, planned for stress testing  Stress was ok, low risk   Admitted to [place] 01/17/21 - 01/20/21 with CP, SOB, hypertensive emergency 223/101, required BIPAP, treated with NTG gtt, diuresis.  BP meds adjusted, had a cath with non-obstructive disease, TTE noted LVEF 55% by limited echo done.  Admitted again 723/22 - 03/16/21, AGAIN, BP ooc, hypertensive emergency, respiratory distress 187/113, BIPAP, NTG gtt, reported medication compliant but had poor knowledge of her meds. Cardiology called, recommended adding Imdur for reports of CP and stopping remeron 2/2 prolonged QT Discussed some bradycardia her metoprolol held though d/c summary mentions perhaps resuming ? Outpt Instructed to hold her metoprolol and HCTZ until seen by cardiology outpt  Today's visit is done with [place] translator [place] Her grandson is also present, [place].  I saw her 03/13/22 She has palpitations is aware of her heart beat, perhaps a little SOB  She ran out of all her medicines except: lisinopril 20mg  daily and Xarelto 20mg  daily She brings empty bottles of coreg 12.5mg  BID and atorvastatin 40mg  daily As far as I can tell Has not been on amiodarone, HCTZ, mirtazapine, MVit for unknown duration No near syncope or syncope. She reports good  compliance with the medicines until she ran out and could not get refills She reports good compliance with her lisinopril and xarelto that she does still have. No problems with bleeding, or signs of bleeding Planned to get her back on amiodarone, coreg and xarelto dose adjusted to 15mg   for Calc CrCl in the 30's Planned for an early return and DCCV once back on amio  TODAY: She is accompanied by her grandson again today She is feeling much better Much less palpitations No CP No SOB No near fainting or fainting No bleeding  She is taking all of the current meds.   AFib/AAD Hx Diagnosed quite remotely Propafenone, sotalol, details unclear Amiodarone started Feb 2022   Past Medical History:  Diagnosis Date   Atrial fibrillation ([place])    dx in setting of stroke 4/12;  echo with EF 60-65%, mild LVH, mild AI, grade 1 diast dysfxn   Coronary artery disease    Diabetes mellitus without complication ([place])    History of CVA (cerebrovascular accident)    New diagnosis December 08, 2010   Hx of cardiovascular stress test    a. Myoview 8/12:  EF 69%, no ischemia.    Hyperlipidemia    Hypertension    Impaired glucose tolerance 05/08/2011   Stroke ([place]) 2012   no deficits   Tobacco abuse    Ongoing (3-6 cigarettes per day, 25 pack year history)    Past Surgical History:  Procedure Laterality Date   CARDIOVERSION N/A 10/28/2020   Procedure: CARDIOVERSION;  Surgeon: Donato Heinz, MD;  Location: [facility] ENDOSCOPY;  Service: Cardiovascular;  Laterality: N/A;   COLONOSCOPY N/A 02/01/2016   Procedure: COLONOSCOPY;  Surgeon: Ruffin Frederick, MD;  Location: [facility] ENDOSCOPY;  Service: Gastroenterology;  Laterality: N/A;    Current Outpatient Medications  Medication Sig Dispense Refill   amiodarone (PACERONE) 200 MG tablet Take 1 tablet (200 mg total) by mouth daily. 90 tablet 3   atorvastatin (LIPITOR) 40 MG tablet Take 1 tablet (40 mg total) by mouth at bedtime. 90 tablet 3    carvedilol (COREG) 3.125 MG tablet Take 1 tablet (3.125 mg total) by mouth 2 (two) times daily. 180 tablet 3   hydrochlorothiazide (MICROZIDE) 12.5 MG capsule Take 1 capsule (12.5 mg total) by mouth daily. (Patient not taking: Reported on 06/07/2021) 90 capsule 2   lisinopril (ZESTRIL) 40 MG tablet Take 1 tablet (40 mg total) by mouth daily. 90 tablet 3   Rivaroxaban (XARELTO) 15 MG TABS tablet Take 1 tablet (15 mg total) by mouth daily with supper. 90 tablet 3   No current facility-administered medications for this visit.    Allergies:   Patient has no known allergies.   Social History:  The patient  reports that she quit smoking about 4 weeks ago. Her smoking use included cigarettes. She has a 6.25 pack-year smoking history. She has never used smokeless tobacco. She reports that she does not drink alcohol and does not use drugs.   Family History:  The patient's family history is not on file.  ROS:  Please see the history of present illness.    All other systems are reviewed and otherwise negative.   PHYSICAL EXAM:  VS:  BP (!) 180/74 (BP Location: Left Arm, Patient Position: Sitting, Cuff Size: Normal)   Pulse (!) 45   Ht 5\' 2"  (1.575 m)   Wt 120 lb (54.4 kg)   SpO2 99%   BMI 21.95 kg/m  BMI: Body mass index is 21.95 kg/m. Well nourished, well developed, in no acute distress HEENT: normocephalic, atraumatic Neck: no JVD, carotid bruits or masses Cardiac:  RRR, bradycardic; no significant murmurs, no rubs, or gallops Lungs:  CTA b/l, no wheezing, rhonchi or rales Abd: soft, nontender MS: no deformity, age appropriate atrophy Ext:  no edema Skin: warm and dry, no rash Neuro:  No gross deficits appreciated Psych: euthymic mood, full affect   EKG:  Done today and reviewed by myself shows  Junctional rhythm 45bpm, narow QRS 100ms  01/18/21; limited echo SUMMARY  LAE, other chamber sizes normal  LVH with apex particularly hypertrophied; EF 55% without segmental  abnormality   mild to moderate AI; 3 leaflet valve  mild TR  no effusion  proximal ascending aorta 59mm, enlarged when indexed.  Global LV systolic function is preserved   01/19/21: [facility] RECOMMENDATION:   Medical therapy for Coronary artery disease risk factor reduction and  Angina  Coronary Findings Diagnostic Dominance: Right  Left Main: Mid LM to Dist LM lesion is 30% stenosed.  Left Anterior Descending: Ost LAD to Prox LAD lesion is 20% stenosed. Mid LAD lesion is 15% stenosed. First Diagonal Branch: 1st Diag lesion is 30% stenosed.  Left Circumflex: Mid Cx lesion is 15% stenosed.  Right Coronary Artery: Prox RCA lesion is 35% stenosed. Dist RCA lesion is 50% stenosed.   12/30/20; stress myoview The left ventricular ejection fraction is normal (55-65%). Nuclear stress EF: 60%. There was no ST segment deviation noted during stress. There is a small defect of moderate severity present in the apical inferior and apex location. The defect is partially reversible.  There is significant extracardiac activity at near the apex on both rest and stress images. The defect is most consistent with extracardiac activity near the apex. In the setting of normal LVF, scar with peri-infarct ischemia is less likely. This is a low risk study.    August 02, 2020 echo Left ventricular function normal, 60% Mild LVH Right ventricular function normal Moderate aortic regurgitation   July 01, 2020 ZIO 21% burden of atrial fibrillation with average ventricular rate during A. fib of 134 bpm   Recent Labs: 03/03/2022: ALT 20; BUN 22; Creatinine, Ser 1.04; Hemoglobin 16.7; Platelets 288; Potassium 4.2; Sodium 144; TSH 3.600  No results found for requested labs within last 365 days.   Estimated Creatinine Clearance: 34.1 mL/min (A) (by C-G formula based on SCr of 1.04 mg/dL (H)).   Wt Readings from Last 3 Encounters:  03/17/22 120 lb (54.4 kg)  03/03/22 117 lb 12.8 oz (53.4 kg)  06/07/21 117 lb 6.4 oz (53.3 kg)      Other studies reviewed: Additional studies/records reviewed today include: summarized above  ASSESSMENT AND PLAN:  Persistent Afib CHA2DS2Vasc is 7, on Xarelto, appropriately dosed Back on amiodarone Junctional rhythm today Reduce her coreg to 3.125mg  BID  HTN Increase lisinopril to 40mg  daily Provided a BP cuff today She will check BP.HR daily  3. HLD Not addressed today   Disposition: as above, have her back in 3-4 weeks, sooner if needed.    Current medicines are reviewed at length with the patient today.  The patient did not have any concerns regarding medicines.  , PA-C 03/17/2022 12:04 PM     [facility] [address] [place] [phone] (office)  [phone] (fax)

## 2022-03-31 ENCOUNTER — Other Ambulatory Visit (HOSPITAL_COMMUNITY): Payer: Self-pay | Admitting: Cardiology

## 2022-03-31 DIAGNOSIS — I4819 Other persistent atrial fibrillation: Secondary | ICD-10-CM

## 2022-03-31 NOTE — Telephone Encounter (Signed)
Prescription refill request for Xarelto received.  Indication:Afib Last office visit:7/23 Weight:54.4 kg Age:81 Scr:1.0 CrCl:38.53 ml/min  Prescription refilled

## 2022-04-09 NOTE — Progress Notes (Unsigned)
Cardiology Office Note Date:  04/09/2022  Patient ID:  Linda [name], Linda [name] 1940-12-20, MRN [id number] PCP:  [name], MD  Cardiologist:  [name] Electrophysiologist: Dr. [name]    Chief Complaint:  planned f/u  History of Present Illness: [name] is a 81 y.o. female with history of DM, stroke, AFib, HTN, Ascending Ao aneurysm (following with CTS)  She saw [name] last 2021, doing ok, had been LTF a few years then, meds adjusted  F/u with the afib clinic since then and eventually referred to Dr. [name] after a hospitalization at [facility] with CHF/flash Pulm edema associated with RVR  She saw [name] 12/21/21, she reported exertional CP,, maintaining SR, planned for stress testing  Stress was ok, low risk   Admitted to [facility] 01/17/21 - 01/20/21 with CP, SOB, hypertensive emergency 223/101, required BIPAP, treated with NTG gtt, diuresis.  BP meds adjusted, had a cath with non-obstructive disease, TTE noted LVEF 55% by limited echo done.  Admitted again 723/22 - 03/16/21, AGAIN, BP ooc, hypertensive emergency, respiratory distress 187/113, BIPAP, NTG gtt, reported medication compliant but had poor knowledge of her meds. Cardiology called, recommended adding Imdur for reports of CP and stopping remeron 2/2 prolonged QT Discussed some bradycardia her metoprolol held though d/c summary mentions perhaps resuming ? Outpt Instructed to hold her metoprolol and HCTZ until seen by cardiology outpt  Today's visit is done with [name] Her grandson is also present, [place].  I saw her 03/13/22 She has palpitations is aware of her heart beat, perhaps a little SOB  She ran out of all her medicines except: lisinopril 20mg  daily and Xarelto 20mg  daily She brings empty bottles of coreg 12.5mg  BID and atorvastatin 40mg  daily As far as I can tell Has not been on amiodarone, HCTZ, mirtazapine, MVit for unknown duration No near syncope or syncope. She reports good  compliance with the medicines until she ran out and could not get refills She reports good compliance with her lisinopril and xarelto that she does still have. No problems with bleeding, or signs of bleeding Planned to get her back on amiodarone, coreg and xarelto dose adjusted to 15mg   for Calc CrCl in the 30's Planned for an early return and DCCV once back on amio  I saw her 02/2822 She is accompanied by her grandson again today She is feeling much better Much less palpitations No CP No SOB No near fainting or fainting No bleeding She is taking all of the current meds. She was in a junctional rhythm 45bpm, and her coreg dose reduced  *** rate/rhythm *** symptoms  AFib/AAD Hx Diagnosed quite remotely Propafenone, sotalol, details unclear Amiodarone started Feb 2022   Past Medical History:  Diagnosis Date   Atrial fibrillation (HCC)    dx in setting of stroke 4/12;  echo with EF 60-65%, mild LVH, mild AI, grade 1 diast dysfxn   Coronary artery disease    Diabetes mellitus without complication (HCC)    History of CVA (cerebrovascular accident)    New diagnosis December 08, 2010   Hx of cardiovascular stress test    a. Myoview 8/12:  EF 69%, no ischemia.    Hyperlipidemia    Hypertension    Impaired glucose tolerance 05/08/2011   Stroke (HCC) 2012   no deficits   Tobacco abuse    Ongoing (3-6 cigarettes per day, 25 pack year history)    Past Surgical History:  Procedure Laterality Date   CARDIOVERSION N/A  10/28/2020   Procedure: CARDIOVERSION;  Surgeon: [name], MD;  Location: [facility] ENDOSCOPY;  Service: Cardiovascular;  Laterality: N/A;   COLONOSCOPY N/A 02/01/2016   Procedure: COLONOSCOPY;  Surgeon: Ruffin Frederick, MD;  Location[facility] ENDOSCOPY;  Service: Gastroenterology;  Laterality: N/A;    Current Outpatient Medications  Medication Sig Dispense Refill   amiodarone (PACERONE) 200 MG tablet Take 1 tablet (200 mg total) by mouth daily. 90 tablet 3    atorvastatin (LIPITOR) 40 MG tablet Take 1 tablet (40 mg total) by mouth at bedtime. 90 tablet 3   carvedilol (COREG) 3.125 MG tablet Take 1 tablet (3.125 mg total) by mouth 2 (two) times daily. 180 tablet 3   hydrochlorothiazide (MICROZIDE) 12.5 MG capsule Take 1 capsule (12.5 mg total) by mouth daily. (Patient not taking: Reported on 06/07/2021) 90 capsule 2   lisinopril (ZESTRIL) 40 MG tablet Take 1 tablet (40 mg total) by mouth daily. 90 tablet 3   Rivaroxaban (XARELTO) 15 MG TABS tablet TAKE 1 TABLET BY MOUTH DAILY WITH SUPPER. PLEASE KEEP UPCOMING APPOINTMENT FOR REFILLS 30 tablet 5   No current facility-administered medications for this visit.    Allergies:   Patient has no known allergies.   Social History:  The patient  reports that she quit smoking about 7 weeks ago. Her smoking use included cigarettes. She has a 6.25 pack-year smoking history. She has never used smokeless tobacco. She reports that she does not drink alcohol and does not use drugs.   Family History:  The patient's family history is not on file.  ROS:  Please see the history of present illness.    All other systems are reviewed and otherwise negative.   PHYSICAL EXAM:  VS:  There were no vitals taken for this visit. BMI: There is no height or weight on file to calculate BMI. Well nourished, well developed, in no acute distress HEENT: normocephalic, atraumatic Neck: no JVD, carotid bruits or masses Cardiac:  *** RRR, bradycardic; no significant murmurs, no rubs, or gallops Lungs:  *** CTA b/l, no wheezing, rhonchi or rales Abd: soft, nontender MS: no deformity, age appropriate atrophy Ext:  *** no edema Skin: warm and dry, no rash Neuro:  No gross deficits appreciated Psych: euthymic mood, full affect   EKG:  Done today and reviewed by myself shows  ***  01/18/21; limited echo SUMMARY  LAE, other chamber sizes normal  LVH with apex particularly hypertrophied; EF 55% without segmental  abnormality  mild  to moderate AI; 3 leaflet valve  mild TR  no effusion  proximal ascending aorta 17mm, enlarged when indexed.  Global LV systolic function is preserved   01/19/21: [facility] RECOMMENDATION:   Medical therapy for Coronary artery disease risk factor reduction and  Angina  Coronary Findings Diagnostic Dominance: Right  Left Main: Mid LM to Dist LM lesion is 30% stenosed.  Left Anterior Descending: Ost LAD to Prox LAD lesion is 20% stenosed. Mid LAD lesion is 15% stenosed. First Diagonal Branch: 1st Diag lesion is 30% stenosed.  Left Circumflex: Mid Cx lesion is 15% stenosed.  Right Coronary Artery: Prox RCA lesion is 35% stenosed. Dist RCA lesion is 50% stenosed.   12/30/20; stress myoview The left ventricular ejection fraction is normal (55-65%). Nuclear stress EF: 60%. There was no ST segment deviation noted during stress. There is a small defect of moderate severity present in the apical inferior and apex location. The defect is partially reversible. There is significant extracardiac activity at near the apex  on both rest and stress images. The defect is most consistent with extracardiac activity near the apex. In the setting of normal LVF, scar with peri-infarct ischemia is less likely. This is a low risk study.    August 02, 2020 echo Left ventricular function normal, 60% Mild LVH Right ventricular function normal Moderate aortic regurgitation   July 01, 2020 ZIO 21% burden of atrial fibrillation with average ventricular rate during A. fib of 134 bpm   Recent Labs: 03/03/2022: ALT 20; BUN 22; Creatinine, Ser 1.04; Hemoglobin 16.7; Platelets 288; Potassium 4.2; Sodium 144; TSH 3.600  No results found for requested labs within last 365 days.   CrCl cannot be calculated (Patient's most recent lab result is older than the maximum 21 days allowed.).   Wt Readings from Last 3 Encounters:  03/17/22 120 lb (54.4 kg)  03/03/22 117 lb 12.8 oz (53.4 kg)  06/07/21 117 lb 6.4 oz (53.3 kg)      Other studies reviewed: Additional studies/records reviewed today include: summarized above  ASSESSMENT AND PLAN:  Persistent Afib CHA2DS2Vasc is 7, on Xarelto, appropriately dosed Back on amiodarone ***  HTN ***  3. HLD Not addressed today   Disposition: ***.    Current medicines are reviewed at length with the patient today.  The patient did not have any concerns regarding medicines.  Venetia Night, PA-C 04/09/2022 2:24 PM     [place] [address] [phone] (office)  [phone] (fax)

## 2022-04-12 ENCOUNTER — Encounter: Payer: Self-pay | Admitting: *Deleted

## 2022-04-12 ENCOUNTER — Encounter: Payer: Self-pay | Admitting: Physician Assistant

## 2022-04-12 ENCOUNTER — Ambulatory Visit (INDEPENDENT_AMBULATORY_CARE_PROVIDER_SITE_OTHER): Payer: Medicare Other | Admitting: Physician Assistant

## 2022-04-12 VITALS — BP 148/78 | HR 71 | Ht 60.0 in | Wt 119.0 lb

## 2022-04-12 DIAGNOSIS — I4819 Other persistent atrial fibrillation: Secondary | ICD-10-CM | POA: Diagnosis not present

## 2022-04-12 DIAGNOSIS — I10 Essential (primary) hypertension: Secondary | ICD-10-CM

## 2022-04-12 MED ORDER — HYDROCHLOROTHIAZIDE 12.5 MG PO CAPS: 12.5000 mg | ORAL_CAPSULE | Freq: Every day | ORAL | 2 refills | Status: DC

## 2022-04-12 NOTE — Patient Instructions (Addendum)
Medication Instructions:   FOR TWO WEEKS ONLY: TAKE AMIODARONE 200 MG TWICE A DAY   2.   AFTER 2 WEEKS: THEN RESUME TAKING 200 MG ONCE A DAY   *If you need a refill on your cardiac medications before your next appointment, please call your pharmacy*   Lab Work: NONE ORDERED  TODAY   If you have labs (blood work) drawn today and your tests are completely normal, you will receive your results only by: MyChart Message (if you have MyChart) OR A paper copy in the mail If you have any lab test that is abnormal or we need to change your treatment, we will call you to review the results.   Testing/Procedures: NONE ORDERED  TODAY   Follow-Up: At [facility], you and your health needs are our priority.  As part of our continuing mission to provide you with exceptional heart care, we have created designated Provider Care Teams.  These Care Teams include your primary Cardiologist (physician) and Advanced Practice Providers (APPs -  Physician Assistants and Nurse Practitioners) who all work together to provide you with the care you need, when you need it.  We recommend signing up for the patient portal called "MyChart".  Sign up information is provided on this After Visit Summary.  MyChart is used to connect with patients for Virtual Visits (Telemedicine).  Patients are able to view lab/test results, encounter notes, upcoming appointments, etc.  Non-urgent messages can be sent to your provider as well.   To learn more about what you can do with MyChart, go to [email or web address].    Your next appointment:   2 week(s)  The format for your next appointment:   In Person  Provider:   Francis Dowse, PA-C        Other Instructions   Important Information About Sugar

## 2022-04-23 NOTE — Progress Notes (Signed)
Cardiology Office Note Date:  04/23/2022  Patient ID:  Linda [name], Linda [name] 12/08/1940, MRN [id number] PCP:  [name], MD  Cardiologist:  [name] Electrophysiologist: Dr. [name]    Chief Complaint:  planned f/u  History of Present Illness: Linda [name] is a 81 y.o. female with history of DM, stroke, AFib, HTN, Ascending Ao aneurysm (following with CTS)  She saw [name] last 2021, doing ok, had been LTF a few years then, meds adjusted  F/u with the afib clinic since then and eventually referred to Dr. [name] after a hospitalization at [facility] with CHF/flash Pulm edema associated with RVR  She saw [name] 12/21/21, she reported exertional CP,, maintaining SR, planned for stress testing  Stress was ok, low risk   Admitted to [facility] 01/17/21 - 01/20/21 with CP, SOB, hypertensive emergency 223/101, required BIPAP, treated with NTG gtt, diuresis.  BP meds adjusted, had a cath with non-obstructive disease, TTE noted LVEF 55% by limited echo done.  Admitted again 723/22 - 03/16/21, AGAIN, BP ooc, hypertensive emergency, respiratory distress 187/113, BIPAP, NTG gtt, reported medication compliant but had poor knowledge of her meds. Cardiology called, recommended adding Imdur for reports of CP and stopping remeron 2/2 prolonged QT Discussed some bradycardia her metoprolol held though d/c summary mentions perhaps resuming ? Outpt Instructed to hold her metoprolol and HCTZ until seen by cardiology outpt  Today's visit is done with [name] Her grandson is also present, [place].  I saw her 03/03/22 She has palpitations is aware of her heart beat, perhaps a little SOB  She ran out of all her medicines except: lisinopril 20mg  daily and Xarelto 20mg  daily She brings empty bottles of coreg 12.5mg  BID and atorvastatin 40mg  daily As far as I can tell Has not been on amiodarone, HCTZ, mirtazapine, MVit for unknown duration No near syncope or syncope. She reports good  compliance with the medicines until she ran out and could not get refills She reports good compliance with her lisinopril and xarelto that she does still have. No problems with bleeding, or signs of bleeding Planned to get her back on amiodarone, coreg and xarelto dose adjusted to 15mg   for Calc CrCl in the 30's Planned for an early return and DCCV once back on amio  I saw her 02/2822 She is accompanied by her grandson again today She is feeling much better Much less palpitations No CP No SOB No near fainting or fainting No bleeding She is taking all of the current meds. She was in a junctional rhythm 45bpm, and her coreg dose reduced  I saw her 04/12/22 Today's visit is assisted by , the She is having the same sensation perhaps some discomfort low center chest with some SOB, a fairly constant sense now at least a couple weeks, not long after she last saw me. No dizzy spells, near fainting or fainting. No bleeding She is not taking the HCTZ,  apparently needing a refill, otherwise reported compliance with her medicines Unclear why, but taking amiodarone only 1/2 tab daily, hand written on the label is "1/2", she thought the pharmacy put it there  She was back in AF, rate controlled, symptoms felt 2/2 AF, advised to take amiodarone 200mg  BID x2 weeks and then daily, her HCTZ refilled. Planned for 2 week visit, if in AF plan DCCV  Admitted to [facility] 04/16/22 - 04/20/22 C/o CP/abd pain, back pain, cardiology consulted felt that her chest pain was secondary to microvascular angina  precipitated by uncontrolled hypertension. Patient has a history of nonadherence to antihypertensives. Coronary angiography on 01/19/2021 showed moderate diffuse coronary artery disease for which medical therapy recommended. Her blood pressure was controlled at discharge on a combination of amlodipine, carvedilol, hydralazine and isosorbide mononitrate. CT thorax abdomen and pelvis was done to  evaluate these multiple areas of pain. No acute pathology was found however a left adnexal cyst is noted. follow-up imaging in 6-12 months is recommended by radiology  EKG report: SB, Qtc 596 report only, no images 04/20/22 K+ 4.0 BUN/Creat 14/0.85 (1.04)   TODAY The visit again supported by [name] our official translator The patient feels well since home from the hospital No ongoing CP, still has some low back pain No SOB, no palpitations No bleeding or signs of bleeding  She is still only taking 1/2 tab of amiodarone (100mg ) daily, despite last visit and translator assistance  AFib/AAD Hx Diagnosed quite remotely Propafenone, sotalol, details unclear Amiodarone started Feb 2022   Past Medical History:  Diagnosis Date   Atrial fibrillation (HCC)    dx in setting of stroke 4/12;  echo with EF 60-65%, mild LVH, mild AI, grade 1 diast dysfxn   Coronary artery disease    Diabetes mellitus without complication (HCC)    History of CVA (cerebrovascular accident)    New diagnosis December 08, 2010   Hx of cardiovascular stress test    a. Myoview 8/12:  EF 69%, no ischemia.    Hyperlipidemia    Hypertension    Impaired glucose tolerance 05/08/2011   Stroke (HCC) 2012   no deficits   Tobacco abuse    Ongoing (3-6 cigarettes per day, 25 pack year history)    Past Surgical History:  Procedure Laterality Date   CARDIOVERSION N/A 10/28/2020   Procedure: CARDIOVERSION;  Surgeon: 12/28/2020, MD;  Location: [facility] ENDOSCOPY;  Service: Cardiovascular;  Laterality: N/A;   COLONOSCOPY N/A 02/01/2016   Procedure: COLONOSCOPY;  Surgeon: 02/03/2016, MD;  Location: [facility] ENDOSCOPY;  Service: Gastroenterology;  Laterality: N/A;    Current Outpatient Medications  Medication Sig Dispense Refill   amiodarone (PACERONE) 200 MG tablet Take 1 tablet (200 mg total) by mouth daily. 90 tablet 3   atorvastatin (LIPITOR) 40 MG tablet Take 1 tablet (40 mg total) by mouth at bedtime. 90  tablet 3   carvedilol (COREG) 3.125 MG tablet Take 1 tablet (3.125 mg total) by mouth 2 (two) times daily. 180 tablet 3   hydrochlorothiazide (MICROZIDE) 12.5 MG capsule Take 1 capsule (12.5 mg total) by mouth daily. 90 capsule 2   lisinopril (ZESTRIL) 40 MG tablet Take 1 tablet (40 mg total) by mouth daily. 90 tablet 3   Rivaroxaban (XARELTO) 15 MG TABS tablet TAKE 1 TABLET BY MOUTH DAILY WITH SUPPER. PLEASE KEEP UPCOMING APPOINTMENT FOR REFILLS 30 tablet 5   No current facility-administered medications for this visit.    Allergies:   Patient has no known allergies.   Social History:  The patient  reports that she quit smoking about 2 months ago. Her smoking use included cigarettes. She has a 6.25 pack-year smoking history. She has never used smokeless tobacco. She reports that she does not drink alcohol and does not use drugs.   Family History:  The patient's family history is not on file.  ROS:  Please see the history of present illness.    All other systems are reviewed and otherwise negative.   PHYSICAL EXAM:  VS:  There were no vitals taken  for this visit. BMI: There is no height or weight on file to calculate BMI. Well nourished, well developed, in no acute distress HEENT: normocephalic, atraumatic Neck: no JVD, carotid bruits or masses Cardiac:  irreg-irreg, bradycardic; no significant murmurs, no rubs, or gallops Lungs:  CTA b/l, no wheezing, rhonchi or rales Abd: soft, nontender MS: no deformity, age appropriate atrophy Ext:  no edema Skin: warm and dry, no rash Neuro:  No gross deficits appreciated Psych: euthymic mood, full affect   EKG:  Done today and reviewed by myself shows AFib 66bp, QTc   01/18/21; limited echo SUMMARY  LAE, other chamber sizes normal  LVH with apex particularly hypertrophied; EF 55% without segmental  abnormality  mild to moderate AI; 3 leaflet valve  mild TR  no effusion  proximal ascending aorta 4mm, enlarged when indexed.   Global LV systolic function is preserved   01/19/21: [facility] RECOMMENDATION:   Medical therapy for Coronary artery disease risk factor reduction and  Angina  Coronary Findings Diagnostic Dominance: Right  Left Main: Mid LM to Dist LM lesion is 30% stenosed.  Left Anterior Descending: Ost LAD to Prox LAD lesion is 20% stenosed. Mid LAD lesion is 15% stenosed. First Diagonal Branch: 1st Diag lesion is 30% stenosed.  Left Circumflex: Mid Cx lesion is 15% stenosed.  Right Coronary Artery: Prox RCA lesion is 35% stenosed. Dist RCA lesion is 50% stenosed.   12/30/20; stress myoview The left ventricular ejection fraction is normal (55-65%). Nuclear stress EF: 60%. There was no ST segment deviation noted during stress. There is a small defect of moderate severity present in the apical inferior and apex location. The defect is partially reversible. There is significant extracardiac activity at near the apex on both rest and stress images. The defect is most consistent with extracardiac activity near the apex. In the setting of normal LVF, scar with peri-infarct ischemia is less likely. This is a low risk study.    August 02, 2020 echo Left ventricular function normal, 60% Mild LVH Right ventricular function normal Moderate aortic regurgitation   July 01, 2020 ZIO 21% burden of atrial fibrillation with average ventricular rate during A. fib of 134 bpm   Recent Labs: 03/03/2022: ALT 20; BUN 22; Creatinine, Ser 1.04; Hemoglobin 16.7; Platelets 288; Potassium 4.2; Sodium 144; TSH 3.600  No results found for requested labs within last 365 days.   CrCl cannot be calculated (Patient's most recent lab result is older than the maximum 21 days allowed.).   Wt Readings from Last 3 Encounters:  04/12/22 119 lb (54 kg)  03/17/22 120 lb (54.4 kg)  03/03/22 117 lb 12.8 oz (53.4 kg)     Other studies reviewed: Additional studies/records reviewed today include: summarized above  ASSESSMENT AND  PLAN:  Persistent Afib CHA2DS2Vasc is 7, on Xarelto, appropriately dosed  Back in Afib but was not  when she was in the hospital a week or so ago. QT is long on low dose amiodarone She is would like to discuss ablation with Dr. [name] to see if he thinks she is a candidate  I will try to keep her on a very small dose of amiodarone for now, take 100mg  (1/2 tab) Monday-Friday, none on Sat/sun. has discussed this with her today and written it for her as well on the bottle  She had junctional rhythm on hier BB dose Mat [place] with rhythm control strategy vs pace/ablate if not an ablation candidate  HTN Much better   3.  HLD Not addressed today    Disposition: will have her see Dr. [name] in the next month or so to discuss perhaps ablation.  .     Current medicines are reviewed at length with the patient today.  The patient did not have any concerns regarding medicines.  Norma Fredrickson, PA-C 04/23/2022 5:47 PM     [facility] [address] [place] [address] [phone] (office)  [phone] (fax)

## 2022-04-27 ENCOUNTER — Other Ambulatory Visit: Payer: Self-pay | Admitting: *Deleted

## 2022-04-27 ENCOUNTER — Ambulatory Visit: Payer: Medicare Other | Attending: Physician Assistant | Admitting: Physician Assistant

## 2022-04-27 ENCOUNTER — Encounter: Payer: Self-pay | Admitting: Physician Assistant

## 2022-04-27 VITALS — BP 138/76 | HR 73 | Ht 60.0 in | Wt 119.0 lb

## 2022-04-27 DIAGNOSIS — I712 Thoracic aortic aneurysm, without rupture, unspecified: Secondary | ICD-10-CM

## 2022-04-27 DIAGNOSIS — I4819 Other persistent atrial fibrillation: Secondary | ICD-10-CM | POA: Diagnosis not present

## 2022-04-27 DIAGNOSIS — I10 Essential (primary) hypertension: Secondary | ICD-10-CM

## 2022-04-27 MED ORDER — AMIODARONE HCL 200 MG PO TABS: 100.0000 mg | ORAL_TABLET | Freq: Every day | ORAL | 3 refills | Status: DC

## 2022-04-27 NOTE — Patient Instructions (Signed)
Medication Instructions:    START TAKING AMIODARONE 100 MG MONDAY - FRIDAY AND NONE ON SATURDAY AND SUNDAY   *If you need a refill on your cardiac medications before your next appointment, please call your pharmacy*   Lab Work: NONE ORDERED  TODAY    If you have labs (blood work) drawn today and your tests are completely normal, you will receive your results only by: MyChart Message (if you have MyChart) OR A paper copy in the mail If you have any lab test that is abnormal or we need to change your treatment, we will call you to review the results.   Testing/Procedures: NONE ORDERED  TODAY    Follow-Up: At [facility], you and your health needs are our priority.  As part of our continuing mission to provide you with exceptional heart care, we have created designated Provider Care Teams.  These Care Teams include your primary Cardiologist (physician) and Advanced Practice Providers (APPs -  Physician Assistants and Nurse Practitioners) who all work together to provide you with the care you need, when you need it.  We recommend signing up for the patient portal called "MyChart".  Sign up information is provided on this After Visit Summary.  MyChart is used to connect with patients for Virtual Visits (Telemedicine).  Patients are able to view lab/test results, encounter notes, upcoming appointments, etc.  Non-urgent messages can be sent to your provider as well.   To learn more about what you can do with MyChart, go to [email or web address].    Your next appointment:   1 month(s)  The format for your next appointment:   In Person  Provider:   You may see Lanier Prude, MD or one of the following Advanced Practice Providers on your designated Care Team:   Francis Dowse, [place] Casimiro Needle "Mardelle Matte" Lanna Poche, [place]    Other Instructions   Important Information About Sugar

## 2022-05-24 NOTE — Progress Notes (Signed)
[address]       [place] [address]             [phone]        PCP is Charlott Rakes, MD Referring Provider is Charlott Rakes, MD  Chief Complaint: Ascending thoracic aortic aneurysm   HPI: This is an 81 year old female with a past medical history of chronic atrial fibrillation, embolic stroke, hypertension, moderate aortic insufficiency, hyperlipidemia, aortic atherosclerosis, coronary artery disease, type 2 diabetes, and tobacco abuse who presents today for yearly surveillance of her ascending thoracic aortic aneurysm. She presents to the office with a [place] interpreter. She states she sometimes has chest pain but denies back pain or shortness of breath. She was last seen by [facility] October 2022 and the ATAA at that time measured 3.8 cm.  Past Medical History:  Diagnosis Date   Atrial fibrillation ([place])    dx in setting of stroke 4/12;  echo with EF 60-65%, mild LVH, mild AI, grade 1 diast dysfxn   Coronary artery disease    Diabetes mellitus without complication ([place])    History of CVA (cerebrovascular accident)    New diagnosis December 08, 2010   Hx of cardiovascular stress test    a. Myoview 8/12:  EF 69%, no ischemia.    Hyperlipidemia    Hypertension    Impaired glucose tolerance 05/08/2011   Stroke ([place]) 2012   no deficits   Tobacco abuse    Ongoing (3-6 cigarettes per day, 25 pack year history)    Past Surgical History:  Procedure Laterality Date   CARDIOVERSION N/A 10/28/2020   Procedure: CARDIOVERSION;  Surgeon: Donato Heinz, MD;  Location: [facility] ENDOSCOPY;  Service: Cardiovascular;  Laterality: N/A;   COLONOSCOPY N/A 02/01/2016   Procedure: COLONOSCOPY;  Surgeon: Manus Gunning, MD;  Location: [facility] ENDOSCOPY;  Service: Gastroenterology;  Laterality: N/A;    Family History  Problem Relation Age of Onset   Coronary artery disease Neg Hx     Social History: Social History   Tobacco Use   Smoking status: Former     Packs/day: 0.25    Years: 25.00    Total pack years: 6.25    Types: Cigarettes    Quit date: 02/15/2022    Years since quitting: 0.2   Smokeless tobacco: Never  Substance Use Topics   Alcohol use: No   Drug use: No    Current Outpatient Medications  Medication Sig Dispense Refill   amiodarone (PACERONE) 200 MG tablet Take 0.5 tablets (100 mg total) by mouth daily. MONDAY - FRIDAY AND NONE ON SATURDAY AND SUNDAY 45 tablet 3   amLODipine (NORVASC) 5 MG tablet Take 5 mg by mouth daily.     atorvastatin (LIPITOR) 80 MG tablet Take 80 mg by mouth daily.     carvedilol (COREG) 3.125 MG tablet Take 1 tablet (3.125 mg total) by mouth 2 (two) times daily. 180 tablet 3   hydrALAZINE (APRESOLINE) 50 MG tablet Take 50 mg by mouth 3 (three) times daily.     isosorbide mononitrate (IMDUR) 60 MG 24 hr tablet Take 60 mg by mouth daily.     lisinopril (ZESTRIL) 40 MG tablet Take 1 tablet (40 mg total) by mouth daily. 90 tablet 3   Rivaroxaban (XARELTO) 15 MG TABS tablet TAKE 1 TABLET BY MOUTH DAILY WITH SUPPER. PLEASE KEEP UPCOMING APPOINTMENT FOR REFILLS 30 tablet 5   Allergies: No Known Allergies  Review of Systems: No orthopnea No  ankle edema No PND  Vital Signs: Vitals:   06/07/22 1342  BP: 132/73  Pulse: 87  Resp: 18  SpO2: 97%    Physical Exam: CV-IRRR IRRR, no murmur Pulmonary-Clear to auscultation bilaterally Abdomen-Soft, non tender, bowel sounds present Extremities-No LE edema Neurologic-Grossly intact without focal deficit  Diagnostic Tests: Narrative & Impression  CLINICAL DATA:  Follow-up of thoracic aortic aneurysm, chest pain, hypertension   EXAM: CT ANGIOGRAPHY CHEST WITH CONTRAST   TECHNIQUE: Multidetector CT imaging of the chest was performed using the standard protocol during bolus administration of intravenous contrast. Multiplanar CT image reconstructions and MIPs were obtained to evaluate the vascular anatomy.   RADIATION DOSE REDUCTION: This exam  was performed according to the departmental dose-optimization program which includes automated exposure control, adjustment of the mA and/or kV according to patient size and/or use of iterative reconstruction technique.   CONTRAST:  37mL ISOVUE-370 IOPAMIDOL (ISOVUE-370) INJECTION 76%   COMPARISON:  04/16/2022   FINDINGS: Cardiovascular: There is homogeneous enhancement in thoracic aorta. There is ectasia of ascending thoracic aorta measuring 4.1 cm in diameter. Atherosclerotic plaques and calcifications are seen in aorta. There are no intraluminal filling defects in pulmonary artery branches.   Mediastinum/Nodes: No significant lymphadenopathy seen.   Lungs/Pleura: There is no focal pulmonary consolidation. Faint ground-glass densities are seen in lower lung fields. There is no pleural effusion or pneumothorax.   Upper Abdomen: There is fatty infiltration in liver. There are multiple calcified gallbladder stones.   Musculoskeletal: No acute findings are seen.   Review of the MIP images confirms the above findings.   IMPRESSION: There is no evidence of pulmonary artery embolism. There is no evidence of thoracic aortic dissection.   There is ectasia of ascending thoracic aorta measuring 4.1 cm. Recommend annual imaging followup by CTA or MRA. This recommendation follows 2010 ACCF/AHA/AATS/ACR/ASA/SCA/SCAI/SIR/STS/SVM Guidelines for the Diagnosis and Management of Patients with Thoracic Aortic Disease. Circulation. 2010; 121: N027-O536. Aortic aneurysm NOS (ICD10-I71.9)   There is no focal pulmonary consolidation. There is no pleural effusion or pneumothorax.   Fatty liver.  Gallbladder stones.     Electronically Signed   By: Elmer Picker M.D.   On: 05/31/2022 15:39    Impression and Plan: We discussed the above findings of the CTA done 05/31/2022 and that there is no need for surgery at this time. We discussed the importance of good blood pressure control,  use of statin, the importance of smoking cessation, avoidance of fluoroquinolone antibiotics, and exercise and activity limitations. Upon review of her medications, both in EMR and the several bags she brought with her, it is clear that she has multiple medications with different dosages. I reviewed her last medical record by EP and her medication list is still not easily interpreted. It appears she is to take Amiodarone 200 mg 1/2 tablet Monday thru Friday (do not take on Saturday or Sunday), Lisinopril 20 mg daily, and Atorvastatin? dose of 80 mg or 40 mg. I am unsure if she is to be taking Coreg, Hydralazine,  Imdur, and Amlodipine and what the doses are. She did ask for a refill on Xarelto so I sent this request to her pharmacy. She has an appointment to see Dr. Quentin Ore next week. The interpreter said she will be accompanying the patient to this visit and will ask for clarification of her medications. We will obtain a CTA and she will return for further surveillance of her ATAA in one year.  Nani Skillern, PA-C [facility] [phone]

## 2022-05-31 ENCOUNTER — Ambulatory Visit
Admission: RE | Admit: 2022-05-31 | Discharge: 2022-05-31 | Disposition: A | Discharge status: Home / Self Care | Payer: Medicare Other | Source: Ambulatory Visit | Attending: Thoracic Surgery (Cardiothoracic Vascular Surgery) | Admitting: Thoracic Surgery (Cardiothoracic Vascular Surgery)

## 2022-05-31 DIAGNOSIS — I712 Thoracic aortic aneurysm, without rupture, unspecified: Secondary | ICD-10-CM

## 2022-05-31 MED ORDER — IOPAMIDOL (ISOVUE-370) INJECTION 76%
75.0000 mL | Freq: Once | INTRAVENOUS | Status: AC | PRN
  Administered 2022-05-31: 75 mL via INTRAVENOUS

## 2022-06-01 ENCOUNTER — Other Ambulatory Visit: Payer: Medicare Other

## 2022-06-07 ENCOUNTER — Ambulatory Visit (INDEPENDENT_AMBULATORY_CARE_PROVIDER_SITE_OTHER): Payer: Medicare Other | Admitting: Physician Assistant

## 2022-06-07 ENCOUNTER — Encounter: Payer: Self-pay | Admitting: Physician Assistant

## 2022-06-07 VITALS — BP 132/73 | HR 87 | Resp 18 | Ht 60.0 in | Wt 121.0 lb

## 2022-06-07 DIAGNOSIS — I4819 Other persistent atrial fibrillation: Secondary | ICD-10-CM | POA: Diagnosis not present

## 2022-06-07 DIAGNOSIS — I712 Thoracic aortic aneurysm, without rupture, unspecified: Secondary | ICD-10-CM

## 2022-06-07 MED ORDER — LISINOPRIL 20 MG PO TABS
20.0000 mg | ORAL_TABLET | Freq: Every day | ORAL | Status: DC
Start: 2022-06-07 — End: 2022-06-13

## 2022-06-07 MED ORDER — RIVAROXABAN 15 MG PO TABS: ORAL_TABLET | ORAL | 1 refills | Status: DC

## 2022-06-07 NOTE — Patient Instructions (Addendum)
Interpreter was used for the following discussion:  Risk Modification in those with ascending thoracic aortic aneurysm:  Continue good control of blood pressure (prefer SBP 130/80 or less)-Continue medications as directed  2. Avoid fluoroquinolone antibiotics (I.e Ciprofloxacin, Avelox, Levofloxacin, Ofloxacin)  3.  Use of statin (to decrease cardiovascular risk)-on Atorvastatin  4.  Exercise and activity limitations is individualized, but in general, contact sports are to be avoided and one should avoid heavy lifting (defined as half of ideal body weight) and exercises involving sustained Valsalva maneuver.  5. Counseling for those suspected of having genetically mediated disease. First-degree relatives of those with TAA disease should be screened as well as those who have a connective tissue disease (I.e with Marfan syndrome, Ehlers-Danlos syndrome, and Loeys-Dietz syndrome) or a  bicuspid aortic valve,have an increased risk for complications related to TAA. Echo done in 2021 showed AV to be tricuspid, moderate AI, trivial MR, aortic dilatation 39 mm. Aforementioned does not apply  6.Linda [name] has a history of tobacco abuse, smoking cessation highly encouraged

## 2022-06-13 ENCOUNTER — Encounter: Payer: Self-pay | Admitting: Cardiology

## 2022-06-13 ENCOUNTER — Ambulatory Visit: Payer: Medicare Other | Attending: Cardiology | Admitting: Cardiology

## 2022-06-13 VITALS — BP 146/70 | HR 69 | Ht 60.0 in | Wt 118.0 lb

## 2022-06-13 DIAGNOSIS — I4819 Other persistent atrial fibrillation: Secondary | ICD-10-CM | POA: Diagnosis not present

## 2022-06-13 DIAGNOSIS — I639 Cerebral infarction, unspecified: Secondary | ICD-10-CM

## 2022-06-13 DIAGNOSIS — I5032 Chronic diastolic (congestive) heart failure: Secondary | ICD-10-CM

## 2022-06-13 MED ORDER — ISOSORBIDE MONONITRATE ER 60 MG PO TB24: 60.0000 mg | ORAL_TABLET | Freq: Every day | ORAL | 3 refills | Status: AC

## 2022-06-13 MED ORDER — RIVAROXABAN 15 MG PO TABS: 15.0000 mg | ORAL_TABLET | Freq: Every day | ORAL | 3 refills | Status: DC

## 2022-06-13 MED ORDER — HYDRALAZINE HCL 50 MG PO TABS: 50.0000 mg | ORAL_TABLET | Freq: Three times a day (TID) | ORAL | 3 refills | Status: DC

## 2022-06-13 MED ORDER — LISINOPRIL 20 MG PO TABS: 20.0000 mg | ORAL_TABLET | Freq: Every day | ORAL | 3 refills | Status: DC

## 2022-06-13 MED ORDER — ATORVASTATIN CALCIUM 80 MG PO TABS: 80.0000 mg | ORAL_TABLET | Freq: Every day | ORAL | 3 refills | Status: AC

## 2022-06-13 MED ORDER — AMLODIPINE BESYLATE 5 MG PO TABS: 5.0000 mg | ORAL_TABLET | Freq: Every day | ORAL | 3 refills | Status: DC

## 2022-06-13 NOTE — Progress Notes (Deleted)
Electrophysiology Office Follow up Visit Note:    Date:  06/13/2022   ID:  Linda [name], DOB [birth year], MRN [id number]  PCP:  Charlott Rakes, MD  [facility] Cardiologist:  Candee Furbish, MD  [facility] Electrophysiologist:  Vickie Epley, MD    Interval History:    Linda [name] is a 81 y.o. female who presents for a follow up visit. They were last seen in clinic April 27, 2022 by [facility].  She has had multiple hospitalizations for decompensated heart failure, atrial fibrillation with RVR.  She takes amiodarone which was started February 2022.  She is on Xarelto for stroke prophylaxis.  She has had trouble with medication adherence and continues to struggle with symptomatic atrial fibrillation and wants to discuss catheter ablation.       Past Medical History:  Diagnosis Date   Atrial fibrillation ([place])    dx in setting of stroke 4/12;  echo with EF 60-65%, mild LVH, mild AI, grade 1 diast dysfxn   Coronary artery disease    Diabetes mellitus without complication ([place])    History of CVA (cerebrovascular accident)    New diagnosis December 08, 2010   Hx of cardiovascular stress test    a. Myoview 8/12:  EF 69%, no ischemia.    Hyperlipidemia    Hypertension    Impaired glucose tolerance 05/08/2011   Stroke ([place]) 2012   no deficits   Tobacco abuse    Ongoing (3-6 cigarettes per day, 25 pack year history)    Past Surgical History:  Procedure Laterality Date   CARDIOVERSION N/A 10/28/2020   Procedure: CARDIOVERSION;  Surgeon: Donato Heinz, MD;  Location: [facility] ENDOSCOPY;  Service: Cardiovascular;  Laterality: N/A;   COLONOSCOPY N/A 02/01/2016   Procedure: COLONOSCOPY;  Surgeon: Manus Gunning, MD;  Location: [facility] ENDOSCOPY;  Service: Gastroenterology;  Laterality: N/A;    Current Medications: No outpatient medications have been marked as taking for the 06/13/22 encounter (Appointment) with Vickie Epley, MD.     Allergies:   Patient has no known  allergies.   Social History   Socioeconomic History   Marital status: Widowed    Spouse name: Not on file   Number of children: Not on file   Years of education: Not on file   Highest education level: Not on file  Occupational History    Employer: [employer]   Occupation: Not working  Tobacco Use   Smoking status: Former    Packs/day: 0.25    Years: 25.00    Total pack years: 6.25    Types: Cigarettes    Quit date: 02/15/2022    Years since quitting: 0.3   Smokeless tobacco: Never  Substance and Sexual Activity   Alcohol use: No   Drug use: No   Sexual activity: Not on file  Other Topics Concern   Not on file  Social History Narrative   Lives in [place] with her daughters   Regular diet   No regular exercise but active and independent with all daily activities   Social Determinants of Health   Financial Resource Strain: Not on file  Food Insecurity: Not on file  Transportation Needs: Not on file  Physical Activity: Not on file  Stress: Not on file  Social Connections: Not on file     Family History: The patient's family history is negative for Coronary artery disease.  ROS:   Please see the history of present illness.    All other systems reviewed and  are negative.  EKGs/Labs/Other Studies Reviewed:    The following studies were reviewed today:  August 02, 2020 echo EF 60 RV normal Trivial MR Moderate AI  Dec 30, 2020 SPECT Low risk study     Recent Labs: 03/03/2022: ALT 20; BUN 22; Creatinine, Ser 1.04; Hemoglobin 16.7; Platelets 288; Potassium 4.2; Sodium 144; TSH 3.600  Recent Lipid Panel    Component Value Date/Time   CHOL 153 07/01/2020 1146   TRIG 199 (H) 07/01/2020 1146   HDL 38 (L) 07/01/2020 1146   CHOLHDL 4.0 07/01/2020 1146   CHOLHDL 5.3 10/13/2016 0058   VLDL 20 10/13/2016 0058   LDLCALC 81 07/01/2020 1146   LDLDIRECT 124.5 08/07/2012 1128    Physical Exam:    VS:  There were no vitals taken for this visit.    Wt  Readings from Last 3 Encounters:  06/07/22 121 lb (54.9 kg)  04/27/22 119 lb (54 kg)  04/12/22 119 lb (54 kg)     GEN: *** Well nourished, well developed in no acute distress HEENT: Normal NECK: No JVD; No carotid bruits LYMPHATICS: No lymphadenopathy CARDIAC: ***RRR, no murmurs, rubs, gallops RESPIRATORY:  Clear to auscultation without rales, wheezing or rhonchi  ABDOMEN: Soft, non-tender, non-distended MUSCULOSKELETAL:  No edema; No deformity  SKIN: Warm and dry NEUROLOGIC:  Alert and oriented x 3 PSYCHIATRIC:  Normal affect        ASSESSMENT:    1. Persistent atrial fibrillation (HCC)   2. Chronic diastolic CHF (congestive heart failure) (HCC)   3. Cerebrovascular accident (CVA), unspecified mechanism (HCC)    PLAN:    In order of problems listed above:  #Persistent atrial fibrillation Symptomatic.  Has led to multiple hospitalizations.  Has been difficult to manage with antiarrhythmics.  Currently on amiodarone.  I discussed options for managing her arrhythmia including alternative antiarrhythmics versus catheter ablation.  She would like to pursue catheter ablation which I think is very reasonable.  I discussed the procedure in detail with patient including the risks with the assistance of an in person interpreter***and she wishes to proceed.  Discussed treatment options today for her AF including antiarrhythmic drug therapy and ablation. Discussed risks, recovery and likelihood of success. Discussed potential need for repeat ablation procedures and antiarrhythmic drugs after an initial ablation. They wish to proceed with scheduling.  Risk, benefits, and alternatives to EP study and radiofrequency ablation for afib were also discussed in detail today. These risks include but are not limited to stroke, bleeding, vascular damage, tamponade, perforation, damage to the esophagus, lungs, and other structures, pulmonary vein stenosis, worsening renal function, and death. The  patient understands these risk and wishes to proceed.  We will therefore proceed with catheter ablation at the next available time.  Carto, ICE, anesthesia are requested for the procedure.  Will also obtain CT PV protocol prior to the procedure to exclude LAA thrombus and further evaluate atrial anatomy.  #Amiodarone monitoring AST/ALT okay in July.  TSH okay in July.     Total time spent with patient today *** minutes. This includes reviewing records, evaluating the patient and coordinating care.   Medication Adjustments/Labs and Tests Ordered: Current medicines are reviewed at length with the patient today.  Concerns regarding medicines are outlined above.  No orders of the defined types were placed in this encounter.  No orders of the defined types were placed in this encounter.    Signed, Steffanie Dunn, MD, [facility], [facility] 06/13/2022 5:58 AM    Electrophysiology [facility] 

## 2022-06-13 NOTE — H&P (View-Only) (Signed)
Electrophysiology Office Follow up Visit Note:    Date:  06/13/2022   ID:  Linda [name], DOB 03/29/1941, MRN [id number]  PCP:  Newlin, Enobong, MD  [facility] Cardiologist:  Mark Skains, MD  [facility] Electrophysiologist:  Kamarie Veno T Elwood Bazinet, MD    Interval History:    Linda [name] is a 81 y.o. female who presents for a follow up visit. They were last seen in clinic April 27, 2022 by [facility].  She has had multiple hospitalizations for decompensated heart failure, atrial fibrillation with RVR.  She takes amiodarone which was started February 2022.  She is on Xarelto for stroke prophylaxis.  She has had trouble with medication adherence and continues to struggle with symptomatic atrial fibrillation and wants to discuss catheter ablation.  Today, the patient is accompanied by a professional interpreter. She states that she is in good health but she had developed a cold starting last week. She says that she isn't sure if she has a fever.   She says that she has had palpitations and irregular beats sometimes. She is unsure if she has had a cardioversion in the past.  She states that she doesn't do much daily activity except walking around the house or yard.  She is compliant with xarelto once a day. She hasn't missed any of her medications.  She denies any palpitations, chest pain, shortness of breath, or peripheral edema. No lightheadedness, headaches, syncope, orthopnea, or PND.  Today's visit performed with the assistance of an in person official translator.    Past Medical History:  Diagnosis Date   Atrial fibrillation (H[facility])    dx in setting of stroke 4/12;  echo with EF 60-65%, mild LVH, mild AI, grade 1 diast dysfxn   Coronary artery disease    Diabetes mellitus without complication ([facility])    History of CVA (cerebrovascular accident)    New diagnosis December 08, 2010   Hx of cardiovascular stress test    a. Myoview 8/12:  EF 69%, no ischemia.    Hyperlipidemia     Hypertension    Impaired glucose tolerance 05/08/2011   Stroke ([facility]) 2012   no deficits   Tobacco abuse    Ongoing (3-6 cigarettes per day, 25 pack year history)    Past Surgical History:  Procedure Laterality Date   CARDIOVERSION N/A 10/28/2020   Procedure: CARDIOVERSION;  Surgeon: Schumann, Christopher L, MD;  Location: [facility] ENDOSCOPY;  Service: Cardiovascular;  Laterality: N/A;   COLONOSCOPY N/A 02/01/2016   Procedure: COLONOSCOPY;  Surgeon: Steven Paul Armbruster, MD;  Location: [facility] ENDOSCOPY;  Service: Gastroenterology;  Laterality: N/A;    Current Medications: Current Meds  Medication Sig   amiodarone (PACERONE) 200 MG tablet Take 0.5 tablets (100 mg total) by mouth daily. MONDAY - FRIDAY AND NONE ON SATURDAY AND SUNDAY   carvedilol (COREG) 3.125 MG tablet Take 1 tablet (3.125 mg total) by mouth 2 (two) times daily.   [DISCONTINUED] amLODipine (NORVASC) 5 MG tablet Take 5 mg by mouth daily.   [DISCONTINUED] atorvastatin (LIPITOR) 80 MG tablet Take 80 mg by mouth daily.   [DISCONTINUED] hydrALAZINE (APRESOLINE) 50 MG tablet Take 50 mg by mouth 3 (three) times daily.   [DISCONTINUED] isosorbide mononitrate (IMDUR) 60 MG 24 hr tablet Take 60 mg by mouth daily.   [DISCONTINUED] lisinopril (ZESTRIL) 20 MG tablet Take 1 tablet (20 mg total) by mouth daily.   [DISCONTINUED] Rivaroxaban (XARELTO) 15 MG TABS tablet TAKE 1 TABLET BY MOUTH DAILY WITH SUPPER. PLEASE KEEP UPCOMING APPOINTMENT   FOR REFILLS     Allergies:   Patient has no known allergies.   Social History   Socioeconomic History   Marital status: Widowed    Spouse name: Not on file   Number of children: Not on file   Years of education: Not on file   Highest education level: Not on file  Occupational History    Employer: [employer]   Occupation: Not working  Tobacco Use   Smoking status: Former    Packs/day: 0.25    Years: 25.00    Total pack years: 6.25    Types: Cigarettes    Quit date: 02/15/2022    Years since  quitting: 0.3   Smokeless tobacco: Never  Substance and Sexual Activity   Alcohol use: No   Drug use: No   Sexual activity: Not on file  Other Topics Concern   Not on file  Social History Narrative   Lives in [place] with her daughters   Regular diet   No regular exercise but active and independent with all daily activities   Social Determinants of Health   Financial Resource Strain: Not on file  Food Insecurity: Not on file  Transportation Needs: Not on file  Physical Activity: Not on file  Stress: Not on file  Social Connections: Not on file     Family History: The patient's family history is negative for Coronary artery disease.  ROS:   Please see the history of present illness.  (+)Coughing  All other systems reviewed and are negative.  EKGs/Labs/Other Studies Reviewed:    The following studies were reviewed today:  August 02, 2020 echo EF 60 RV normal Trivial MR Moderate AI  Dec 30, 2020 SPECT Low risk study     Recent Labs: 03/03/2022: ALT 20; BUN 22; Creatinine, Ser 1.04; Hemoglobin 16.7; Platelets 288; Potassium 4.2; Sodium 144; TSH 3.600  Recent Lipid Panel    Component Value Date/Time   CHOL 153 07/01/2020 1146   TRIG 199 (H) 07/01/2020 1146   HDL 38 (L) 07/01/2020 1146   CHOLHDL 4.0 07/01/2020 1146   CHOLHDL 5.3 10/13/2016 0058   VLDL 20 10/13/2016 0058   LDLCALC 81 07/01/2020 1146   LDLDIRECT 124.5 08/07/2012 1128    Physical Exam:    VS:  BP (!) 146/70   Pulse 69   Ht 5' (1.524 m)   Wt 118 lb (53.5 kg)   BMI 23.05 kg/m     Wt Readings from Last 3 Encounters:  06/13/22 118 lb (53.5 kg)  06/07/22 121 lb (54.9 kg)  04/27/22 119 lb (54 kg)     GEN: Well nourished, well developed in no acute distress HEENT: Normal NECK: No JVD; No carotid bruits LYMPHATICS: No lymphadenopathy CARDIAC: Irregularly irregular, no murmurs, rubs, gallops RESPIRATORY:  Clear to auscultation without rales, wheezing or rhonchi  ABDOMEN: Soft,  non-tender, non-distended MUSCULOSKELETAL:  No edema; No deformity  SKIN: Warm and dry NEUROLOGIC:  Alert and oriented x 3 PSYCHIATRIC:  Normal affect     ASSESSMENT:    1. Persistent atrial fibrillation (HCC)   2. Chronic diastolic CHF (congestive heart failure) (HCC)   3. Cerebrovascular accident (CVA), unspecified mechanism (HCC)    PLAN:    In order of problems listed above:  #Persistent atrial fibrillation Symptomatic.  Has led to multiple hospitalizations.  Has been difficult to manage with antiarrhythmics.  Currently on amiodarone but remains in atrial fibrillation/flutter.  I discussed options for managing her arrhythmia including alternative antiarrhythmics versus catheter ablation.      I like to start with a cardioversion to see if she feels better normal rhythm.  I discussed the cardioversion procedure in detail with patient including the risks and she wishes to proceed.    I will have her follow-up with an APP in 3 months.  She will need repeat blood work for amiodarone monitoring at that appointment.   #Amiodarone monitoring AST/ALT okay in July.  TSH okay in July.    Medication Adjustments/Labs and Tests Ordered: Current medicines are reviewed at length with the patient today.  Concerns regarding medicines are outlined above.  No orders of the defined types were placed in this encounter.  Meds ordered this encounter  Medications   amLODipine (NORVASC) 5 MG tablet    Sig: Take 1 tablet (5 mg total) by mouth daily.    Dispense:  90 tablet    Refill:  3   atorvastatin (LIPITOR) 80 MG tablet    Sig: Take 1 tablet (80 mg total) by mouth daily.    Dispense:  90 tablet    Refill:  3   hydrALAZINE (APRESOLINE) 50 MG tablet    Sig: Take 1 tablet (50 mg total) by mouth 3 (three) times daily.    Dispense:  270 tablet    Refill:  3   isosorbide mononitrate (IMDUR) 60 MG 24 hr tablet    Sig: Take 1 tablet (60 mg total) by mouth daily.    Dispense:  90 tablet     Refill:  3   lisinopril (ZESTRIL) 20 MG tablet    Sig: Take 1 tablet (20 mg total) by mouth daily.    Dispense:  90 tablet    Refill:  3   Rivaroxaban (XARELTO) 15 MG TABS tablet    Sig: Take 1 tablet (15 mg total) by mouth daily with supper.    Dispense:  90 tablet    Refill:  3   I,Coren O'Brien,acting as a scribe for Vickie Epley, MD.,have documented all relevant documentation on the behalf of Vickie Epley, MD,as directed by  Vickie Epley, MD while in the presence of Vickie Epley, MD.  I, Vickie Epley, MD, have reviewed all documentation for this visit. The documentation on 06/13/22 for the exam, diagnosis, procedures, and orders are all accurate and complete.   Signed, Lars Mage, MD, [facility], [facility] 06/13/2022 8:17 PM    Electrophysiology [facility]

## 2022-06-13 NOTE — Progress Notes (Signed)
Electrophysiology Office Follow up Visit Note:    Date:  06/13/2022   ID:  Linda [name], DOB 06-27-1941, MRN [id number]  PCP:  [name], MD  [facility] Cardiologist:  [name], MD  [facility] Electrophysiologist:  Lanier Prude, MD    Interval History:    Linda [name] is a 82 y.o. female who presents for a follow up visit. They were last seen in clinic April 27, 2022 by [facility].  She has had multiple hospitalizations for decompensated heart failure, atrial fibrillation with RVR.  She takes amiodarone which was started February 2022.  She is on Xarelto for stroke prophylaxis.  She has had trouble with medication adherence and continues to struggle with symptomatic atrial fibrillation and wants to discuss catheter ablation.  Today, the patient is accompanied by a professional interpreter. She states that she is in good health but she had developed a cold starting last week. She says that she isn't sure if she has a fever.   She says that she has had palpitations and irregular beats sometimes. She is unsure if she has had a cardioversion in the past.  She states that she doesn't do much daily activity except walking around the house or yard.  She is compliant with xarelto once a day. She hasn't missed any of her medications.  She denies any palpitations, chest pain, shortness of breath, or peripheral edema. No lightheadedness, headaches, syncope, orthopnea, or PND.  Today's visit performed with the assistance of an in person official translator.    Past Medical History:  Diagnosis Date   Atrial fibrillation (HCC)    dx in setting of stroke 4/12;  echo with EF 60-65%, mild LVH, mild AI, grade 1 diast dysfxn   Coronary artery disease    Diabetes mellitus without complication (HCC)    History of CVA (cerebrovascular accident)    New diagnosis December 08, 2010   Hx of cardiovascular stress test    a. Myoview 8/12:  EF 69%, no ischemia.    Hyperlipidemia     Hypertension    Impaired glucose tolerance 05/08/2011   Stroke (HCC) 2012   no deficits   Tobacco abuse    Ongoing (3-6 cigarettes per day, 25 pack year history)    Past Surgical History:  Procedure Laterality Date   CARDIOVERSION N/A 10/28/2020   Procedure: CARDIOVERSION;  Surgeon: [name], MD;  Location: [facility] ENDOSCOPY;  Service: Cardiovascular;  Laterality: N/A;   COLONOSCOPY N/A 02/01/2016   Procedure: COLONOSCOPY;  Surgeon: Ruffin Frederick, MD;  Location: [facility] ENDOSCOPY;  Service: Gastroenterology;  Laterality: N/A;    Current Medications: Current Meds  Medication Sig   amiodarone (PACERONE) 200 MG tablet Take 0.5 tablets (100 mg total) by mouth daily. MONDAY - FRIDAY AND NONE ON SATURDAY AND SUNDAY   carvedilol (COREG) 3.125 MG tablet Take 1 tablet (3.125 mg total) by mouth 2 (two) times daily.   [DISCONTINUED] amLODipine (NORVASC) 5 MG tablet Take 5 mg by mouth daily.   [DISCONTINUED] atorvastatin (LIPITOR) 80 MG tablet Take 80 mg by mouth daily.   [DISCONTINUED] hydrALAZINE (APRESOLINE) 50 MG tablet Take 50 mg by mouth 3 (three) times daily.   [DISCONTINUED] isosorbide mononitrate (IMDUR) 60 MG 24 hr tablet Take 60 mg by mouth daily.   [DISCONTINUED] lisinopril (ZESTRIL) 20 MG tablet Take 1 tablet (20 mg total) by mouth daily.   [DISCONTINUED] Rivaroxaban (XARELTO) 15 MG TABS tablet TAKE 1 TABLET BY MOUTH DAILY WITH SUPPER. PLEASE KEEP UPCOMING APPOINTMENT  FOR REFILLS     Allergies:   Patient has no known allergies.   Social History   Socioeconomic History   Marital status: Widowed    Spouse name: Not on file   Number of children: Not on file   Years of education: Not on file   Highest education level: Not on file  Occupational History    Employer: [employer]   Occupation: Not working  Tobacco Use   Smoking status: Former    Packs/day: 0.25    Years: 25.00    Total pack years: 6.25    Types: Cigarettes    Quit date: 02/15/2022    Years since  quitting: 0.3   Smokeless tobacco: Never  Substance and Sexual Activity   Alcohol use: No   Drug use: No   Sexual activity: Not on file  Other Topics Concern   Not on file  Social History Narrative   Lives in [place] with her daughters   Regular diet   No regular exercise but active and independent with all daily activities   Social Determinants of Health   Financial Resource Strain: Not on file  Food Insecurity: Not on file  Transportation Needs: Not on file  Physical Activity: Not on file  Stress: Not on file  Social Connections: Not on file     Family History: The patient's family history is negative for Coronary artery disease.  ROS:   Please see the history of present illness.  (+)Coughing  All other systems reviewed and are negative.  EKGs/Labs/Other Studies Reviewed:    The following studies were reviewed today:  August 02, 2020 echo EF 60 RV normal Trivial MR Moderate AI  Dec 30, 2020 SPECT Low risk study     Recent Labs: 03/03/2022: ALT 20; BUN 22; Creatinine, Ser 1.04; Hemoglobin 16.7; Platelets 288; Potassium 4.2; Sodium 144; TSH 3.600  Recent Lipid Panel    Component Value Date/Time   CHOL 153 07/01/2020 1146   TRIG 199 (H) 07/01/2020 1146   HDL 38 (L) 07/01/2020 1146   CHOLHDL 4.0 07/01/2020 1146   CHOLHDL 5.3 10/13/2016 0058   VLDL 20 10/13/2016 0058   LDLCALC 81 07/01/2020 1146   LDLDIRECT 124.5 08/07/2012 1128    Physical Exam:    VS:  BP (!) 146/70   Pulse 69   Ht 5' (1.524 m)   Wt 118 lb (53.5 kg)   BMI 23.05 kg/m     Wt Readings from Last 3 Encounters:  06/13/22 118 lb (53.5 kg)  06/07/22 121 lb (54.9 kg)  04/27/22 119 lb (54 kg)     GEN: Well nourished, well developed in no acute distress HEENT: Normal NECK: No JVD; No carotid bruits LYMPHATICS: No lymphadenopathy CARDIAC: Irregularly irregular, no murmurs, rubs, gallops RESPIRATORY:  Clear to auscultation without rales, wheezing or rhonchi  ABDOMEN: Soft,  non-tender, non-distended MUSCULOSKELETAL:  No edema; No deformity  SKIN: Warm and dry NEUROLOGIC:  Alert and oriented x 3 PSYCHIATRIC:  Normal affect     ASSESSMENT:    1. Persistent atrial fibrillation (HCC)   2. Chronic diastolic CHF (congestive heart failure) (HCC)   3. Cerebrovascular accident (CVA), unspecified mechanism (HCC)    PLAN:    In order of problems listed above:  #Persistent atrial fibrillation Symptomatic.  Has led to multiple hospitalizations.  Has been difficult to manage with antiarrhythmics.  Currently on amiodarone but remains in atrial fibrillation/flutter.  I discussed options for managing her arrhythmia including alternative antiarrhythmics versus catheter ablation.  I like to start with a cardioversion to see if she feels better normal rhythm.  I discussed the cardioversion procedure in detail with patient including the risks and she wishes to proceed.    I will have her follow-up with an APP in 3 months.  She will need repeat blood work for amiodarone monitoring at that appointment.   #Amiodarone monitoring AST/ALT okay in July.  TSH okay in July.    Medication Adjustments/Labs and Tests Ordered: Current medicines are reviewed at length with the patient today.  Concerns regarding medicines are outlined above.  No orders of the defined types were placed in this encounter.  Meds ordered this encounter  Medications   amLODipine (NORVASC) 5 MG tablet    Sig: Take 1 tablet (5 mg total) by mouth daily.    Dispense:  90 tablet    Refill:  3   atorvastatin (LIPITOR) 80 MG tablet    Sig: Take 1 tablet (80 mg total) by mouth daily.    Dispense:  90 tablet    Refill:  3   hydrALAZINE (APRESOLINE) 50 MG tablet    Sig: Take 1 tablet (50 mg total) by mouth 3 (three) times daily.    Dispense:  270 tablet    Refill:  3   isosorbide mononitrate (IMDUR) 60 MG 24 hr tablet    Sig: Take 1 tablet (60 mg total) by mouth daily.    Dispense:  90 tablet     Refill:  3   lisinopril (ZESTRIL) 20 MG tablet    Sig: Take 1 tablet (20 mg total) by mouth daily.    Dispense:  90 tablet    Refill:  3   Rivaroxaban (XARELTO) 15 MG TABS tablet    Sig: Take 1 tablet (15 mg total) by mouth daily with supper.    Dispense:  90 tablet    Refill:  3   I,Coren O'Brien,acting as a scribe for Vickie Epley, MD.,have documented all relevant documentation on the behalf of Vickie Epley, MD,as directed by  Vickie Epley, MD while in the presence of Vickie Epley, MD.  I, Vickie Epley, MD, have reviewed all documentation for this visit. The documentation on 06/13/22 for the exam, diagnosis, procedures, and orders are all accurate and complete.   Signed, Lars Mage, MD, [facility], [facility] 06/13/2022 8:17 PM    Electrophysiology [facility]

## 2022-06-13 NOTE — Patient Instructions (Signed)
Medication Instructions:  None  *If you need a refill on your cardiac medications before your next appointment, please call your pharmacy*   Lab Work: None  If you have labs (blood work) drawn today and your tests are completely normal, you will receive your results only by: MyChart Message (if you have MyChart) OR A paper copy in the mail If you have any lab test that is abnormal or we need to change your treatment, we will call you to review the results.   Testing/Procedures: Your physician has recommended that you have a Cardioversion (DCCV). Electrical Cardioversion uses a jolt of electricity to your heart either through paddles or wired patches attached to your chest. This is a controlled, usually prescheduled, procedure. Defibrillation is done under light anesthesia in the hospital, and you usually go home the day of the procedure. This is done to get your heart back into a normal rhythm. You are not awake for the procedure. Please see the instruction sheet given to you today.    Follow-Up: At [facility], you and your health needs are our priority.  As part of our continuing mission to provide you with exceptional heart care, we have created designated Provider Care Teams.  These Care Teams include your primary Cardiologist (physician) and Advanced Practice Providers (APPs -  Physician Assistants and Nurse Practitioners) who all work together to provide you with the care you need, when you need it.  We recommend signing up for the patient portal called "MyChart".  Sign up information is provided on this After Visit Summary.  MyChart is used to connect with patients for Virtual Visits (Telemedicine).  Patients are able to view lab/test results, encounter notes, upcoming appointments, etc.  Non-urgent messages can be sent to your provider as well.   To learn more about what you can do with MyChart, go to [email or web address].    Your next appointment:   8 week(s)  The  format for your next appointment:   In Person  Provider:   You will see one of the following Advanced Practice Providers on your designated Care Team:   Francis Dowse, [place] Casimiro Needle "Mardelle Matte" Lanna Poche, [place]      Other Instructions Cardioversion   You are scheduled for a Cardioversion on Nov 8 with Dr. Servando Salina.  Please arrive at the [facility] (Main Entrance A) at [facility]: [address] [place], [place] [address] at 9:30 am am. (1 hour prior to procedure unless lab work is needed; if lab work is needed arrive 1.5 hours ahead)  DIET: Nothing to eat or drink after midnight except a sip of water with medications (see medication instructions below)  FYI: For your safety, and to allow [name] to monitor your vital signs accurately during the surgery/procedure we request that   if you have artificial nails, gel coating, SNS etc. Please have those removed prior to your surgery/procedure. Not having the nail coverings /polish removed may result in cancellation or delay of your surgery/procedure.   Medication Instructions: Take all AM medications with a sip of water   Continue your anticoagulant: Xarelto  You will need to continue your anticoagulant after your procedure until you  are told by your Provider that it is safe to stop   Lab: will be the morning of your procedure.   You must have a responsible person to drive you home and stay in the waiting area during your procedure. Failure to do so could result in cancellation.  Bring your insurance cards.  *  Special Note: Every effort is made to have your procedure done on time. Occasionally there are emergencies that occur at the hospital that may cause delays. Please be patient if a delay does occur.      Important Information About Sugar     None

## 2022-06-19 ENCOUNTER — Encounter (HOSPITAL_COMMUNITY): Payer: Self-pay | Admitting: *Deleted

## 2022-06-19 ENCOUNTER — Other Ambulatory Visit: Payer: Self-pay

## 2022-06-19 ENCOUNTER — Ambulatory Visit (HOSPITAL_COMMUNITY)
Admission: EM | Admit: 2022-06-19 | Discharge: 2022-06-19 | Disposition: A | Discharge status: Home / Self Care | Payer: Medicare Other | Attending: Emergency Medicine | Admitting: Emergency Medicine

## 2022-06-19 ENCOUNTER — Ambulatory Visit (INDEPENDENT_AMBULATORY_CARE_PROVIDER_SITE_OTHER): Payer: Medicare Other

## 2022-06-19 DIAGNOSIS — U071 COVID-19: Secondary | ICD-10-CM | POA: Insufficient documentation

## 2022-06-19 DIAGNOSIS — R059 Cough, unspecified: Secondary | ICD-10-CM | POA: Diagnosis not present

## 2022-06-19 DIAGNOSIS — R051 Acute cough: Secondary | ICD-10-CM

## 2022-06-19 LAB — RESP PANEL BY RT-PCR (FLU A&B, COVID) ARPGX2
Influenza A by PCR: NEGATIVE
Influenza B by PCR: NEGATIVE
SARS Coronavirus 2 by RT PCR: POSITIVE — AB

## 2022-06-19 MED ORDER — GUAIFENESIN 100 MG/5ML PO LIQD: 100.0000 mg | ORAL | 0 refills | Status: DC | PRN

## 2022-06-19 NOTE — ED Triage Notes (Signed)
Pt has had a cough for one week sometimes cough is productive.

## 2022-06-19 NOTE — ED Provider Notes (Signed)
[place]    CSN: [id number] Arrival date & time: 06/19/22  1136     History   Chief Complaint Chief Complaint  Patient presents with   Cough    HPI Linda [name] is a 81 y.o. female.  Presents with interpretor 6 day history of productive cough, nasal congestion No fevers. Denies shortness of breath Has not taken any cough medicines  History of afib, CHF, DM On xarelto  Saw cardiology 6 days ago. Symptoms began after visit   Past Medical History:  Diagnosis Date   Atrial fibrillation ([place])    dx in setting of stroke 4/12;  echo with EF 60-65%, mild LVH, mild AI, grade 1 diast dysfxn   Coronary artery disease    Diabetes mellitus without complication ([place])    History of CVA (cerebrovascular accident)    New diagnosis December 08, 2010   Hx of cardiovascular stress test    a. Myoview 8/12:  EF 69%, no ischemia.    Hyperlipidemia    Hypertension    Impaired glucose tolerance 05/08/2011   Stroke ([place]) 2012   no deficits   Tobacco abuse    Ongoing (3-6 cigarettes per day, 25 pack year history)    Patient Active Problem List   Diagnosis Date Noted   Atypical atrial flutter ([place]) 10/22/2020   Secondary hypercoagulable state ([place]) 06/02/2020   Diabetes mellitus without complication ([place]) 81/08/7508   Aneurysm of thoracic aorta ([place]) 10/23/2016   Tobacco abuse    Chronic diastolic CHF (congestive heart failure) ([place])    Other hyperlipidemia    Renal infarct ([place]) 10/12/2016   Constipation 10/12/2016   Gout attack: Probable left great toe 10/12/2016   Smoker 05/08/2011   Hypertension 03/27/2011   Hyperlipidemia 03/27/2011   Atrial fibrillation ([place]) 12/16/2010   CVA (cerebral vascular accident) ([place]) 12/16/2010    Past Surgical History:  Procedure Laterality Date   CARDIOVERSION N/A 10/28/2020   Procedure: CARDIOVERSION;  Surgeon: Donato Heinz, MD;  Location: [facility] ENDOSCOPY;  Service: Cardiovascular;  Laterality: N/A;   COLONOSCOPY N/A  02/01/2016   Procedure: COLONOSCOPY;  Surgeon: Manus Gunning, MD;  Location: [place];  Service: Gastroenterology;  Laterality: N/A;    OB History   No obstetric history on file.      Home Medications    Prior to Admission medications   Medication Sig Start Date End Date Taking? Authorizing Provider  guaiFENesin (ROBITUSSIN) 100 MG/5ML liquid Take 5-10 mLs (100-200 mg total) by mouth every 4 (four) hours as needed for cough or to loosen phlegm. 06/19/22  Yes Loukas Antonson, Wells Guiles, PA-C  amiodarone (PACERONE) 200 MG tablet Take 0.5 tablets (100 mg total) by mouth daily. MONDAY - FRIDAY AND NONE ON SATURDAY AND SUNDAY 04/27/22   Baldwin Jamaica, PA-C  amLODipine (NORVASC) 5 MG tablet Take 1 tablet (5 mg total) by mouth daily. 06/13/22   Vickie Epley, MD  atorvastatin (LIPITOR) 80 MG tablet Take 1 tablet (80 mg total) by mouth daily. 06/13/22   Vickie Epley, MD  carvedilol (COREG) 3.125 MG tablet Take 1 tablet (3.125 mg total) by mouth 2 (two) times daily. 03/17/22   Baldwin Jamaica, PA-C  hydrALAZINE (APRESOLINE) 50 MG tablet Take 1 tablet (50 mg total) by mouth 3 (three) times daily. 06/13/22   Vickie Epley, MD  isosorbide mononitrate (IMDUR) 60 MG 24 hr tablet Take 1 tablet (60 mg total) by mouth daily. 06/13/22   Vickie Epley, MD  lisinopril (ZESTRIL) 20  MG tablet Take 1 tablet (20 mg total) by mouth daily. 06/13/22   Lanier Prude, MD  Rivaroxaban (XARELTO) 15 MG TABS tablet Take 1 tablet (15 mg total) by mouth daily with supper. 06/13/22   Lanier Prude, MD    Family History Family History  Problem Relation Age of Onset   Coronary artery disease Neg Hx     Social History Social History   Tobacco Use   Smoking status: Former    Packs/day: 0.25    Years: 25.00    Total pack years: 6.25    Types: Cigarettes    Quit date: 02/15/2022    Years since quitting: 0.3   Smokeless tobacco: Never  Substance Use Topics   Alcohol use: No   Drug  use: No     Allergies   Patient has no known allergies.   Review of Systems Review of Systems  Respiratory:  Positive for cough.    Per HPI  Physical Exam Triage Vital Signs ED Triage Vitals  Enc Vitals Group     BP 06/19/22 1224 104/70     Pulse Rate 06/19/22 1224 (!) 53     Resp 06/19/22 1224 18     Temp 06/19/22 1224 98.2 F (36.8 C)     Temp src --      SpO2 06/19/22 1224 100 %     Weight --      Height --      Head Circumference --      Peak Flow --      Pain Score 06/19/22 1222 0     Pain Loc --      Pain Edu? --      Excl. in GC? --    No data found.  Updated Vital Signs BP 104/70   Pulse (!) 53   Temp 98.2 F (36.8 C)   Resp 18   SpO2 100%    Physical Exam Vitals and nursing note reviewed.  Constitutional:      General: She is not in acute distress.    Appearance: Normal appearance. She is not ill-appearing.  HENT:     Nose: Nose normal. No rhinorrhea.     Mouth/Throat:     Mouth: Mucous membranes are moist.     Pharynx: Oropharynx is clear. No posterior oropharyngeal erythema.  Cardiovascular:     Rate and Rhythm: Normal rate.     Heart sounds: Normal heart sounds.  Pulmonary:     Effort: Pulmonary effort is normal. No respiratory distress.     Breath sounds: Normal breath sounds. No wheezing, rhonchi or rales.     Comments: Coarse sounds lower lobes, faint. Wet sounding cough in clinic  Musculoskeletal:     Cervical back: Normal range of motion.  Lymphadenopathy:     Cervical: No cervical adenopathy.  Skin:    General: Skin is warm and dry.  Neurological:     Mental Status: She is alert and oriented to person, place, and time.     UC Treatments / Results  Labs (all labs ordered are listed, but only abnormal results are displayed) Labs Reviewed  RESP PANEL BY RT-PCR (FLU A&B, COVID) [id number]   EKG  Radiology DG Chest 2 View  Result Date: 06/19/2022 CLINICAL DATA:  Cough for 1 week EXAM: CHEST - 2 VIEW COMPARISON:  04/16/2022,  05/31/2022 FINDINGS: The heart size and mediastinal contours are within normal limits. Aortic atherosclerosis. No focal airspace consolidation, pleural effusion, or pneumothorax. The visualized skeletal structures  are unremarkable. IMPRESSION: No active cardiopulmonary disease. Electronically Signed   By: Duanne Guess D.O.   On: 06/19/2022 14:03    Procedures Procedures   Medications Ordered in UC Medications - No data to display  Initial Impression / Assessment and Plan / UC Course  I have reviewed the triage vital signs and the nursing notes.  Pertinent labs & imaging results that were available during my care of the patient were reviewed by me and considered in my medical decision making (see chart for details).  Patient interpretor reports she needs to leave for another appointment. I recommend to stay for xray results but will call with any abnormality.  In the meantime can try robitussin 100 mg q4 hours prn  Flu and covid swab pending - patient understands will not change treatment plan given symptoms for 6 days but would still like viral testing  Chest xray negative.  We had discussed symptomatic care before patient left. Will stick with this plan. Recommend to follow up with PCP if symptoms persist. ED if they worsen   Final Clinical Impressions(s) / UC Diagnoses   Final diagnoses:  Acute cough     Discharge Instructions      I will call you if anything on your xray is worrisome and requires further treatment.  You can try the cough medicine every 4 hours to loosen congestion.  Follow up with primary care if symptoms persist.  Please go to the emergency department if symptoms worsen.     ED Prescriptions     Medication Sig Dispense Auth. Provider   guaiFENesin (ROBITUSSIN) 100 MG/5ML liquid Take 5-10 mLs (100-200 mg total) by mouth every 4 (four) hours as needed for cough or to loosen phlegm. 60 mL Rigo Letts, Lurena Joiner, PA-C      PDMP not reviewed this  encounter.   Marlow Baars, [place] 06/19/22 1638

## 2022-06-19 NOTE — Discharge Instructions (Addendum)
I will call you if anything on your xray is worrisome and requires further treatment.  You can try the cough medicine every 4 hours to loosen congestion.  Follow up with primary care if symptoms persist.  Please go to the emergency department if symptoms worsen.

## 2022-06-21 ENCOUNTER — Telehealth: Payer: Self-pay | Admitting: *Deleted

## 2022-06-21 NOTE — Telephone Encounter (Signed)
Love, Lujean Rave, RN  Darrell Jewel, RN; Vickie Epley, MD Hey, Linda [name] is scheduled for a cardioversion on 11/8.  She was diagnosed with covid on 10/30.  She will need to be postponed at least a day (total 10 days) or if she's immunocompromised or very symptomatic for total of 21 days.  Can someone from your office follow up with her so she can be rescheduled?  Thanks!   Larene Beach RN

## 2022-06-21 NOTE — Telephone Encounter (Signed)
Dr. Quentin Ore recommends 21 days at least. Canceled DCCV with scheduling.

## 2022-06-26 NOTE — Telephone Encounter (Signed)
Spoke to the patient with the interpreter. ID [id number]. Discussed her up coming DCCV that was scheduled for 06/28/22. She said she did not know what I was talking about. I asked if there was a family member I could call.   Noted there was no one at the [place] with her on 10/24. The interpreter that was with her said he would tell everything to the patients daughter.   Patient asked that I call Kendell Bane. (I was naming people on her contact list) After speaking with Claiborne Billings she was an interpreter that works with the patient as well. Discussed what was going on with [facility] and the need for her to have her procedure rescheduled related to her Covid positive test. I rescheduled for Nov 21 with arrival time of 10:30 am at [facility]. Claiborne Billings said she would talk to the patient about this and confirmed that the patient did have a daughter.   Called [phone]. Phone not accepting calls at this time.   Called [phone]. Left voice mail to call the office.   Called [place] DPR . This is the patients Grandson. We were able to communicate in [place]. Gave advisement about DCCV and need to reschedule because of covid. Gave date and time of new DCCV.  Went over cardioversion instructions as well the need for family assistance on day of procedure. Verbalized understanding and agreement.   Advised to call back if they have any questions or need to reschedule.  Verbalized understanding and agreement.

## 2022-06-28 ENCOUNTER — Encounter (HOSPITAL_COMMUNITY): Payer: Self-pay

## 2022-06-28 ENCOUNTER — Ambulatory Visit (HOSPITAL_COMMUNITY): Admit: 2022-06-28 | Payer: Medicare Other | Admitting: Cardiology

## 2022-06-28 DIAGNOSIS — Z01818 Encounter for other preprocedural examination: Secondary | ICD-10-CM

## 2022-06-28 DIAGNOSIS — I484 Atypical atrial flutter: Secondary | ICD-10-CM

## 2022-06-28 SURGERY — CARDIOVERSION
Anesthesia: General

## 2022-07-10 NOTE — Progress Notes (Signed)
Spoke with Linda [name] at [employer], [phone], who is going to make sure that Linda [name], [phone], will be here for the pt's appointment tomorrow. [name] spoke with patient today to confirm appointment for tomorrow and that the patient will have a driver to bring her home. Weston Settle, RN

## 2022-07-11 ENCOUNTER — Encounter (HOSPITAL_COMMUNITY)
Admission: RE | Disposition: A | Discharge status: Home / Self Care | Payer: Self-pay | Source: Home / Self Care | Attending: Cardiology

## 2022-07-11 ENCOUNTER — Encounter (HOSPITAL_COMMUNITY): Payer: Self-pay | Admitting: Anesthesiology

## 2022-07-11 ENCOUNTER — Ambulatory Visit (HOSPITAL_COMMUNITY)
Admission: RE | Admit: 2022-07-11 | Discharge: 2022-07-11 | Disposition: A | Discharge status: Home / Self Care | Payer: Medicare Other | Attending: Cardiology | Admitting: Cardiology

## 2022-07-11 DIAGNOSIS — Z538 Procedure and treatment not carried out for other reasons: Secondary | ICD-10-CM | POA: Diagnosis not present

## 2022-07-11 DIAGNOSIS — Z01818 Encounter for other preprocedural examination: Secondary | ICD-10-CM

## 2022-07-11 DIAGNOSIS — I4819 Other persistent atrial fibrillation: Secondary | ICD-10-CM | POA: Insufficient documentation

## 2022-07-11 SURGERY — CANCELLED PROCEDURE

## 2022-07-11 NOTE — Interval H&P Note (Signed)
History and Physical Interval Note:  07/11/2022 1:50 PM  Linda [name]  has presented today for surgery, with the diagnosis of AFIB.  The various methods of treatment have been discussed with the patient and family. After consideration of risks, benefits and other options for treatment, the patient has consented to  Procedure(s): CANCELLED PROCEDURE as a surgical intervention.  The patient's history has been reviewed, patient examined, no change in status, stable for surgery.  I have reviewed the patient's chart and labs.  Questions were answered to the patient's satisfaction.     Armanda Magic

## 2022-07-11 NOTE — Progress Notes (Signed)
Patient presents today with interpreter.  Patient has had issues with understanding her medications. The interpreter has worked at length with her to try to get her to understand her meds and when to take them.  The patient had her Xarelto in a different bag of meds she had not been taking.  She also was taking Carvedilol which was supposed to be 3.125mg  BID but is using a 12.5mg  tablet and thinks she may be taking the whole tablet but she is not sure.  At last OV her amlodpine, Hydralazine, Imdur, Xarelto and Lisinopril were refilled but it is not clear if she is taking them because the bottles are pretty full.   I have spoken with Alphonzo Severance, PA in afib clinic and they will call the patient to set up appt to go over meds and see if we can make the medication doses easier for her and try to get Rxs where she does not have to cut pills.  Her Vital signs are stable on current meds so I will just keep her on the Carvedilol 12.5mg  BID for now.

## 2022-07-11 NOTE — Progress Notes (Signed)
Pt and pt's interpreter, [name], aware of appointment at the Afib clinic on 07/18/22 at 0830. Spoke with interpretive services who will assign [name] to this pt on the appointment day. Pt's cardioversion cancelled today and pt discharged home with instructions per Dr. Mayford Knife. Weston Settle, RN

## 2022-07-18 ENCOUNTER — Ambulatory Visit (HOSPITAL_COMMUNITY)
Admit: 2022-07-18 | Discharge: 2022-07-18 | Disposition: A | Discharge status: Home / Self Care | Payer: Medicare Other | Attending: Physician Assistant | Admitting: Physician Assistant

## 2022-07-18 ENCOUNTER — Encounter (HOSPITAL_COMMUNITY): Payer: Self-pay | Admitting: Physician Assistant

## 2022-07-18 VITALS — BP 128/78 | HR 48 | Ht 60.0 in | Wt 120.4 lb

## 2022-07-18 DIAGNOSIS — I4892 Unspecified atrial flutter: Secondary | ICD-10-CM | POA: Insufficient documentation

## 2022-07-18 DIAGNOSIS — I2510 Atherosclerotic heart disease of native coronary artery without angina pectoris: Secondary | ICD-10-CM | POA: Insufficient documentation

## 2022-07-18 DIAGNOSIS — Z7901 Long term (current) use of anticoagulants: Secondary | ICD-10-CM | POA: Diagnosis not present

## 2022-07-18 DIAGNOSIS — Z720 Tobacco use: Secondary | ICD-10-CM | POA: Diagnosis not present

## 2022-07-18 DIAGNOSIS — I10 Essential (primary) hypertension: Secondary | ICD-10-CM | POA: Insufficient documentation

## 2022-07-18 DIAGNOSIS — E119 Type 2 diabetes mellitus without complications: Secondary | ICD-10-CM | POA: Insufficient documentation

## 2022-07-18 DIAGNOSIS — E785 Hyperlipidemia, unspecified: Secondary | ICD-10-CM | POA: Insufficient documentation

## 2022-07-18 DIAGNOSIS — I484 Atypical atrial flutter: Secondary | ICD-10-CM

## 2022-07-18 DIAGNOSIS — I4819 Other persistent atrial fibrillation: Secondary | ICD-10-CM | POA: Insufficient documentation

## 2022-07-18 DIAGNOSIS — D6869 Other thrombophilia: Secondary | ICD-10-CM | POA: Insufficient documentation

## 2022-07-18 NOTE — Progress Notes (Addendum)
Primary Care Physician: [name], MD Primary Cardiologist: Dr [name] Primary Electrophysiologist: Dr [name]  Referring Physician: Dr Pura Spice is a 81 y.o. female with a history of CAD, DM, prior CVA, HTN, HLD, tobacco abuse, and persistent atrial fibrillation who presents for follow up in the [facility].  The patient was initially diagnosed with atrial fibrillation remotely and had previously been on propafenone. She had followed with Dr Graciela Husbands but was lost to follow up in 2018. Patient is on Xarelto for a CHADS2VASC score of 8. She reports that over the last year she has had intermittent episodes of SOB and palpitations. This occurs 2-3 times in 2 weeks. The symptoms last ~1 minute. Zio patch showed 21% afib burden with elevated rates. Seen by Dr [name] on 07/01/20 and started on metoprolol.   Patient hospitalized at [facility] 09/21/20 with hypertensive emergency, flash pulmonary edema, and rapid afib. She presented with chest pressure and shortness of breath. BP noted by EMS to be in the 240s systolic. Patient arrived to ED and placed on BiPAP with improvement in symptoms. CXR noted pulmonary edema EKG noted afib with RVR and a rate of 130-150. Patient with RVR, initially trialed on amio bolus/gtt but switched to dilt gtt when amio did not cause any effect. dilt led to good rate control and patient transitioned to PO cardizem. Cardiology consulted as well and consolidated to 120 mg QD cardizem, patient became tachycardic. Increased cardizem to 180 mg QD. Patient was given labetalol, lasix, and cardizem to treat patient which resulted in drop in BP. BP medications were adjusted and increased lisinopril to 40 mg QD, increased cardizem to 180 mg QD and HCTZ 12.5 mg QD. Patient is s/p DCCV on 10/28/20.   Patient had recurrence of her afib and was seen by Dr [name] on 06/13/22 who recommended DCCV. She presented for the procedure on 07/11/22 but the patient  had not been taking several of her medications, including Xarelto, and the procedure was cancelled.   An interpretor was used for today's visit. On follow up today, patient is back in sinus bradycardia. She states that she feels better with no tachypalpitations. She has stayed on the higher dose of BB. No bleeding issues on anticoagulation.   Today, she denies symptoms of palpitations, SOB, chest pain, orthopnea, PND, lower extremity edema, dizziness, presyncope, syncope, snoring, daytime somnolence, bleeding, or neurologic sequela. The patient is tolerating medications without difficulties and is otherwise without complaint today.    Atrial Fibrillation Risk Factors:  she does not have symptoms or diagnosis of sleep apnea. she does not have a history of rheumatic fever.   she has a BMI of Body mass index is 23.51 kg/m.Marland Kitchen Filed Weights   07/18/22 0828  Weight: 54.6 kg     Family History  Problem Relation Age of Onset   Coronary artery disease Neg Hx      Atrial Fibrillation Management history:  Previous antiarrhythmic drugs: propafenone, amiodarone  Previous cardioversions: 10/28/20 Previous ablations: none CHADS2VASC score: 8 Anticoagulation history: warfarin, Xarelto   Past Medical History:  Diagnosis Date   Atrial fibrillation (HCC)    dx in setting of stroke 4/12;  echo with EF 60-65%, mild LVH, mild AI, grade 1 diast dysfxn   Coronary artery disease    Diabetes mellitus without complication (HCC)    History of CVA (cerebrovascular accident)    New diagnosis December 08, 2010   Hx of cardiovascular stress test  a. Myoview 8/12:  EF 69%, no ischemia.    Hyperlipidemia    Hypertension    Impaired glucose tolerance 05/08/2011   Stroke (HCC) 2012   no deficits   Tobacco abuse    Ongoing (3-6 cigarettes per day, 25 pack year history)   Past Surgical History:  Procedure Laterality Date   CARDIOVERSION N/A 10/28/2020   Procedure: CARDIOVERSION;  Surgeon: [name], MD;  Location: [facility] ENDOSCOPY;  Service: Cardiovascular;  Laterality: N/A;   COLONOSCOPY N/A 02/01/2016   Procedure: COLONOSCOPY;  Surgeon: Ruffin Frederick, MD;  Location: [facility] ENDOSCOPY;  Service: Gastroenterology;  Laterality: N/A;    Current Outpatient Medications  Medication Sig Dispense Refill   amiodarone (PACERONE) 200 MG tablet Take 0.5 tablets (100 mg total) by mouth daily. MONDAY - FRIDAY AND NONE ON SATURDAY AND SUNDAY 45 tablet 3   amLODipine (NORVASC) 5 MG tablet Take 1 tablet (5 mg total) by mouth daily. 90 tablet 3   atorvastatin (LIPITOR) 80 MG tablet Take 1 tablet (80 mg total) by mouth daily. 90 tablet 3   carvedilol (COREG) 3.125 MG tablet Take 1 tablet (3.125 mg total) by mouth 2 (two) times daily. 180 tablet 3   guaiFENesin (ROBITUSSIN) 100 MG/5ML liquid Take 5-10 mLs (100-200 mg total) by mouth every 4 (four) hours as needed for cough or to loosen phlegm. 60 mL 0   hydrALAZINE (APRESOLINE) 50 MG tablet Take 1 tablet (50 mg total) by mouth 3 (three) times daily. 270 tablet 3   isosorbide mononitrate (IMDUR) 60 MG 24 hr tablet Take 1 tablet (60 mg total) by mouth daily. 90 tablet 3   lisinopril (ZESTRIL) 20 MG tablet Take 1 tablet (20 mg total) by mouth daily. 90 tablet 3   Rivaroxaban (XARELTO) 15 MG TABS tablet Take 1 tablet (15 mg total) by mouth daily with supper. 90 tablet 3   No current facility-administered medications for this encounter.    No Known Allergies  Social History   Socioeconomic History   Marital status: Widowed    Spouse name: Not on file   Number of children: Not on file   Years of education: Not on file   Highest education level: Not on file  Occupational History    Employer: [employer]   Occupation: Not working  Tobacco Use   Smoking status: Former    Packs/day: 0.25    Years: 25.00    Total pack years: 6.25    Types: Cigarettes    Quit date: 02/15/2022    Years since quitting: 0.4   Smokeless tobacco: Never    Tobacco comments:    Former smoker 07/18/22  Substance and Sexual Activity   Alcohol use: No   Drug use: No   Sexual activity: Not on file  Other Topics Concern   Not on file  Social History Narrative   Lives in [place] with her daughters   Regular diet   No regular exercise but active and independent with all daily activities   Social Determinants of Health   Financial Resource Strain: Not on file  Food Insecurity: Not on file  Transportation Needs: Not on file  Physical Activity: Not on file  Stress: Not on file  Social Connections: Not on file  Intimate Partner Violence: Not on file     ROS- All systems are reviewed and negative except as per the HPI above.  Physical Exam: Vitals:   07/18/22 0828  BP: 128/78  Pulse: (!) 48  Weight: 54.6 kg  Height: 5' (1.524 m)     GEN- The patient is a well appearing elderly female, alert and oriented x 3 today.   HEENT-head normocephalic, atraumatic, sclera clear, conjunctiva pink, hearing intact, trachea midline. Lungs- Clear to ausculation bilaterally, normal work of breathing Heart- Regular rate and rhythm, bradycardia, no murmurs, rubs or gallops  GI- soft, NT, ND, + BS Extremities- no clubbing, cyanosis, or edema MS- no significant deformity or atrophy Skin- no rash or lesion Psych- euthymic mood, full affect Neuro- strength and sensation are intact   Wt Readings from Last 3 Encounters:  07/18/22 54.6 kg  06/13/22 53.5 kg  06/07/22 54.9 kg    EKG today demonstrates  SB Vent. rate 48 BPM PR interval 174 ms QRS duration 88 ms QT/QTcB 564/503 ms  Echo 08/02/20 demonstrated  1. Left ventricular ejection fraction, by estimation, is 60 to 65%. The  left ventricle has normal function. The left ventricle has no regional  wall motion abnormalities. There is mild left ventricular hypertrophy.  Left ventricular diastolic parameters are consistent with Grade I diastolic dysfunction (impaired relaxation).   2. Right  ventricular systolic function is normal. The right ventricular  size is normal.   3. The mitral valve is normal in structure. Trivial mitral valve  regurgitation. No evidence of mitral stenosis.   4. The aortic valve is tricuspid. Aortic valve regurgitation is moderate.  No aortic stenosis is present. Aortic regurgitation PHT measures 566 msec.   5. Aortic dilatation noted. There is mild dilatation of the ascending  aorta, measuring 39 mm.   6. The inferior vena cava is normal in size with greater than 50%  respiratory variability, suggesting right atrial pressure of 3 mmHg.   Epic records are reviewed at length today  CHA2DS2-VASc Score = 8  The patient's score is based upon: CHF History: 0 HTN History: 1 Diabetes History: 1 Stroke History: 2 Vascular Disease History: 1 Age Score: 2 Gender Score: 1        ASSESSMENT AND PLAN: 1. Persistent Atrial Fibrillation/atrial flutter The patient's CHA2DS2-VASc score is 8, indicating a 10.8% annual risk of stroke.   Patient back in SR. We went through each of her medications together to make sure she had refills and knew which ones to take. She denies any fatigue, dizziness, or presyncope with bradycardia. Would favor continuing carvedilol at present dose.  Continue amiodarone 100 mg Mon-Fri.  Continue carvedilol 12.5 mg BID Continue Xarelto 15 mg daily  2. Secondary Hypercoagulable State (ICD10:  D68.69) The patient is at significant risk for stroke/thromboembolism based upon her CHA2DS2-VASc Score of 8.  Continue Rivaroxaban (Xarelto).   3. CAD Low risk stress test 12/2020 No anginal symptoms.  4. HTN Stable, no changes today.  5. Possible PAD Patient had Quantaflo screening with PCP office which showed possible moderate PAD bilaterally. No claudication. Will have her follow up with her primary cardiologist.    Follow up with Francis Dowse as scheduled.    Jorja Loa PA-C Afib Clinic [facility] [address] [place], [place] [address] [phone] 07/18/2022 11:29 AM

## 2022-08-10 ENCOUNTER — Ambulatory Visit: Payer: Medicare Other | Admitting: Physician Assistant

## 2022-08-11 ENCOUNTER — Encounter: Payer: Self-pay | Admitting: Physician Assistant

## 2022-08-11 ENCOUNTER — Ambulatory Visit: Payer: Medicare Other | Attending: Physician Assistant | Admitting: Physician Assistant

## 2022-08-11 VITALS — BP 100/48 | HR 64 | Ht 59.0 in | Wt 119.6 lb

## 2022-08-11 DIAGNOSIS — I4819 Other persistent atrial fibrillation: Secondary | ICD-10-CM

## 2022-08-11 DIAGNOSIS — D6869 Other thrombophilia: Secondary | ICD-10-CM

## 2022-08-11 DIAGNOSIS — I484 Atypical atrial flutter: Secondary | ICD-10-CM | POA: Diagnosis not present

## 2022-08-11 DIAGNOSIS — I10 Essential (primary) hypertension: Secondary | ICD-10-CM

## 2022-08-11 DIAGNOSIS — I639 Cerebral infarction, unspecified: Secondary | ICD-10-CM | POA: Diagnosis not present

## 2022-08-11 MED ORDER — LISINOPRIL 10 MG PO TABS: 10.0000 mg | ORAL_TABLET | Freq: Every day | ORAL | 3 refills | Status: DC

## 2022-08-11 NOTE — Patient Instructions (Signed)
Medication Instructions:  1.Decrease lisinopril to 10 mg daily *If you need a refill on your cardiac medications before your next appointment, please call your pharmacy*   Lab Work: None If you have labs (blood work) drawn today and your tests are completely normal, you will receive your results only by: MyChart Message (if you have MyChart) OR A paper copy in the mail If you have any lab test that is abnormal or we need to change your treatment, we will call you to review the results.   Follow-Up: At [facility], you and your health needs are our priority.  As part of our continuing mission to provide you with exceptional heart care, we have created designated Provider Care Teams.  These Care Teams include your primary Cardiologist (physician) and Advanced Practice Providers (APPs -  Physician Assistants and Nurse Practitioners) who all work together to provide you with the care you need, when you need it.  Your next appointment:   3 month(s)  The format for your next appointment:   In Person  Provider:   [name], MD   Other Instructions 1.Omron is the brand of blood pressure monitor that we recommend  2.Check your blood pressure daily, one hour after taking your morning medications for the next few weeks, keep a log and send [name] the readings through mychart  Important Information About Sugar

## 2022-08-11 NOTE — Progress Notes (Signed)
Office Visit    Patient Name: Linda [name] Date of Encounter: 08/11/2022  PCP:  [name], MD   [facility]  Cardiologist:  [name], MD  Advanced Practice Provider:  No care team member to display Electrophysiologist:  Lanier Prude, MD   HPI    Linda [name] is a 81 y.o. female with a past medical history of atrial fibrillation and embolic stroke x 2, coronary artery disease, diabetes, hypertension, dilated aorta, CAD, tobacco use, CHA2DS2-VASc of 8 presents today for follow-up appointment.  She was last seen by [name] 07/01/2020 at that time was having intermittent shortness of breath and palpitations 2-3 times a week.  Her symptoms were lasting about 1 minute.  In review of atrial fibrillation clinic note she was in normal sinus rhythm at that encounter 06/02/2020.  She has been followed closely by the atrial fibrillation clinic and most recently saw Dr. [name] 07/18/2022.  She reported that over the last year or so she had intermittent episodes of shortness of breath and palpitations.  Her Zio patch showed 21% A-fib burden with elevated rates.  She was started on metoprolol at her follow-up with [name].  She was hospitalized 09/21/2020 with hypertensive emergency, flash pulmonary edema, and rapid A-fib.  Presented with chest pain and shortness of breath.  BP noted by EMS to be in the 240s systolic.  Patient arrived and was put on BiPAP without any improvement.  Chest x-ray noted pulmonary edema and in atrial fibrillation with RVR rate 09/20/1948.  She was transitioned to p.o. Cardizem.  Cardiology was consulted.  Cardizem was increased to 180 mg daily.  Patient was given labetalol, Lasix, and Cardizem to treat which resulted in drop in BP.  BP medications were adjusted and lisinopril was increased to 40 daily.  Patient underwent DCCV 10/28/2020.  Patient have recurrent atrial fibrillation 06/13/2022 and recommended DCCV.  Presented for procedure  07/11/2022 but patient had not been taking several of her medications including her Xarelto.  The procedure was canceled.  When she was seen 07/18/2022 she was back in sinus bradycardia but was feeling better with no tachypalpitations.  She had stayed on the higher dose of beta-blocker.  She was encouraged to continue amiodarone 100 mg Monday through Friday, carvedilol 12.5 mg twice daily, and Xarelto 15 mg daily.   Today, she does not have any CV complaints. She feels good. She needs a refill of her Amio today. We discussed all her medications and verified the appropriate dose of each medication. Her BP is low today and because of her age we have decreased her lisinopril to 10mg  daily. She was encouraged to get an [place] cuff from the pharmacy today and keep track of her BP daily.   Reports no shortness of breath nor dyspnea on exertion. Reports no chest pain, pressure, or tightness. No edema, orthopnea, PND. Reports no palpitations.    Past Medical History    Past Medical History:  Diagnosis Date   Atrial fibrillation (HCC)    dx in setting of stroke 4/12;  echo with EF 60-65%, mild LVH, mild AI, grade 1 diast dysfxn   Coronary artery disease    Diabetes mellitus without complication (HCC)    History of CVA (cerebrovascular accident)    New diagnosis December 08, 2010   Hx of cardiovascular stress test    a. Myoview 8/12:  EF 69%, no ischemia.    Hyperlipidemia    Hypertension    Impaired  glucose tolerance 05/08/2011   Stroke ([place]) 2012   no deficits   Tobacco abuse    Ongoing (3-6 cigarettes per day, 25 pack year history)   Past Surgical History:  Procedure Laterality Date   CARDIOVERSION N/A 10/28/2020   Procedure: CARDIOVERSION;  Surgeon: Donato Heinz, MD;  Location: [facility] ENDOSCOPY;  Service: Cardiovascular;  Laterality: N/A;   COLONOSCOPY N/A 02/01/2016   Procedure: COLONOSCOPY;  Surgeon: Manus Gunning, MD;  Location: [facility] ENDOSCOPY;  Service: Gastroenterology;   Laterality: N/A;    Allergies  No Known Allergies   EKGs/Labs/Other Studies Reviewed:   The following studies were reviewed today:  CT Aorta 05/31/22  Narrative & Impression  CLINICAL DATA:  Follow-up of thoracic aortic aneurysm, chest pain, hypertension   EXAM: CT ANGIOGRAPHY CHEST WITH CONTRAST   TECHNIQUE: Multidetector CT imaging of the chest was performed using the standard protocol during bolus administration of intravenous contrast. Multiplanar CT image reconstructions and MIPs were obtained to evaluate the vascular anatomy.   RADIATION DOSE REDUCTION: This exam was performed according to the departmental dose-optimization program which includes automated exposure control, adjustment of the mA and/or kV according to patient size and/or use of iterative reconstruction technique.   CONTRAST:  19mL ISOVUE-370 IOPAMIDOL (ISOVUE-370) INJECTION 76%   COMPARISON:  04/16/2022   FINDINGS: Cardiovascular: There is homogeneous enhancement in thoracic aorta. There is ectasia of ascending thoracic aorta measuring 4.1 cm in diameter. Atherosclerotic plaques and calcifications are seen in aorta. There are no intraluminal filling defects in pulmonary artery branches.   Mediastinum/Nodes: No significant lymphadenopathy seen.   Lungs/Pleura: There is no focal pulmonary consolidation. Faint ground-glass densities are seen in lower lung fields. There is no pleural effusion or pneumothorax.   Upper Abdomen: There is fatty infiltration in liver. There are multiple calcified gallbladder stones.   Musculoskeletal: No acute findings are seen.   Review of the MIP images confirms the above findings.   IMPRESSION: There is no evidence of pulmonary artery embolism. There is no evidence of thoracic aortic dissection.   There is ectasia of ascending thoracic aorta measuring 4.1 cm. Recommend annual imaging followup by CTA or MRA. This recommendation follows 2010  ACCF/AHA/AATS/ACR/ASA/SCA/SCAI/SIR/STS/SVM Guidelines for the Diagnosis and Management of Patients with Thoracic Aortic Disease. Circulation. 2010; 121ML:[id number]. Aortic aneurysm NOS (ICD10-I71.9)    EKG:  EKG is not ordered today.    Recent Labs: 03/03/2022: ALT 20; BUN 22; Creatinine, Ser 1.04; Hemoglobin 16.7; Platelets 288; Potassium 4.2; Sodium 144; TSH 3.600  Recent Lipid Panel    Component Value Date/Time   CHOL 153 07/01/2020 1146   TRIG 199 (H) 07/01/2020 1146   HDL 38 (L) 07/01/2020 1146   CHOLHDL 4.0 07/01/2020 1146   CHOLHDL 5.3 10/13/2016 0058   VLDL 20 10/13/2016 0058   LDLCALC 81 07/01/2020 1146   LDLDIRECT 124.5 08/07/2012 1128    Risk Assessment/Calculations:   CHA2DS2-VASc Score = 8   This indicates a 10.8% annual risk of stroke. The patient's score is based upon: CHF History: 0 HTN History: 1 Diabetes History: 1 Stroke History: 2 Vascular Disease History: 1 Age Score: 2 Gender Score: 1   Home Medications   Current Meds  Medication Sig   amiodarone (PACERONE) 200 MG tablet Take 0.5 tablets (100 mg total) by mouth daily. MONDAY - FRIDAY AND NONE ON SATURDAY AND SUNDAY   amLODipine (NORVASC) 5 MG tablet Take 1 tablet (5 mg total) by mouth daily.   atorvastatin (LIPITOR) 80 MG tablet Take 1 tablet (80  mg total) by mouth daily.   carvedilol (COREG) 12.5 MG tablet Take 12.5 mg by mouth 2 (two) times daily with a meal.   guaiFENesin (ROBITUSSIN) 100 MG/5ML liquid Take 5-10 mLs (100-200 mg total) by mouth every 4 (four) hours as needed for cough or to loosen phlegm.   hydrALAZINE (APRESOLINE) 50 MG tablet Take 1 tablet (50 mg total) by mouth 3 (three) times daily.   isosorbide mononitrate (IMDUR) 60 MG 24 hr tablet Take 1 tablet (60 mg total) by mouth daily.   lisinopril (ZESTRIL) 10 MG tablet Take 1 tablet (10 mg total) by mouth daily.   Rivaroxaban (XARELTO) 15 MG TABS tablet Take 1 tablet (15 mg total) by mouth daily with supper.   [DISCONTINUED]  lisinopril (ZESTRIL) 20 MG tablet Take 1 tablet (20 mg total) by mouth daily.     Review of Systems      All other systems reviewed and are otherwise negative except as noted above.  Physical Exam    VS:  BP (!) 100/48   Pulse 64   Ht 4\' 11"  (1.499 m)   Wt 119 lb 9.6 oz (54.3 kg)   SpO2 98%   BMI 24.16 kg/m  , BMI Body mass index is 24.16 kg/m.  Wt Readings from Last 3 Encounters:  08/11/22 119 lb 9.6 oz (54.3 kg)  07/18/22 120 lb 6.4 oz (54.6 kg)  06/13/22 118 lb (53.5 kg)     GEN: Well nourished, well developed, in no acute distress. HEENT: normal. Neck: Supple, no JVD, carotid bruits, or masses. Cardiac: RRR, no murmurs, rubs, or gallops. No clubbing, cyanosis, edema.  Radials/PT 2+ and equal bilaterally.  Respiratory:  Respirations regular and unlabored, clear to auscultation bilaterally. GI: Soft, nontender, nondistended. MS: No deformity or atrophy. Skin: Warm and dry, no rash. Neuro:  Strength and sensation are intact. Psych: Normal affect.  Assessment & Plan    Persistent atrial fibrillation/atrial flutter -continue current medication regimen with Amio 100 mg daily, Coreg 12.5mg  BID, and Xarelto 15mg  daily  CAD -asymptomatic at this time -continue current medications  Hypertension -hypotension reduce lisinopril to 10mg  daily -please keep track of your BP at home and obtain a new home BP cuff  Possible PAD -asymptomatic at this time -will hold off on ABIs for now  6. Ascending thoracic aortic aneurysm -annual CT scan due 05/2023         Disposition: Follow up 3 months with Candee Furbish, MD or APP.  Signed, Elgie Collard, PA-C 08/11/2022, 5:28 PM [facility]

## 2022-08-20 NOTE — Progress Notes (Deleted)
Cardiology Office Note Date:  04/23/2022  Patient ID:  Linda [name], Linda [name] 12/08/1940, MRN [id number] PCP:  Linda Register, MD  Cardiologist:  Dr. Anne [name] Electrophysiologist: Dr. Lalla [name]    Chief Complaint:  planned f/u  History of Present Illness: Linda [name] is a 81 y.o. female with history of DM, stroke, AFib, HTN, Ascending Ao aneurysm (following with CTS)  She saw Dr. Anne [name] last 2021, doing ok, had been LTF a few years then, meds adjusted  F/u with the afib clinic since then and eventually referred to Dr. Lalla [name] after a hospitalization at [facility] with CHF/flash Pulm edema associated with RVR  She saw D. Linda [name] 12/21/21, she reported exertional CP,, maintaining SR, planned for stress testing  Stress was ok, low risk   Admitted [facility]17/21 - 01/20/21 with CP, SOB, hypertensive emergency 223/101, required BIPAP, treated with NTG gtt, diuresis.  BP meds adjusted, had a cath with non-obstructive disease, TTE noted LVEF 55% by limited echo done.  Admitted again 723/22 - 03/16/21, AGAIN, BP ooc, hypertensive emergency, respiratory distress 187/113, BIPAP, NTG gtt, reported medication compliant but had poor knowledge of her meds. Cardiology called, recommended adding Imdur for reports of CP and stopping remeron 2/2 prolonged QT Discussed some bradycardia her metoprolol held though d/c summary mentions perhaps resuming ? Outpt Instructed to hold her metoprolol and HCTZ until seen by cardiology outpt  Today's visit is done with [place] translator [place] Her grandson is also present, [place].  I saw her 03/03/22 She has palpitations is aware of her heart beat, perhaps a little SOB  She ran out of all her medicines except: lisinopril 20mg  daily and Xarelto 20mg  daily She brings empty bottles of coreg 12.5mg  BID and atorvastatin 40mg  daily As far as I can tell Has not been on amiodarone, HCTZ, mirtazapine, MVit for unknown duration No near syncope or syncope. She reports good  compliance with the medicines until she ran out and could not get refills She reports good compliance with her lisinopril and xarelto that she does still have. No problems with bleeding, or signs of bleeding Planned to get her back on amiodarone, coreg and xarelto dose adjusted to 15mg   for Calc CrCl in the 30's Planned for an early return and DCCV once back on amio  I saw her 02/2822 She is accompanied by her grandson again today She is feeling much better Much less palpitations No CP No SOB No near fainting or fainting No bleeding She is taking all of the current meds. She was in a junctional rhythm 45bpm, and her coreg dose reduced  I saw her 04/12/22 Today's visit is assisted by , the She is having the same sensation perhaps some discomfort low center chest with some SOB, a fairly constant sense now at least a couple weeks, not long after she last saw me. No dizzy spells, near fainting or fainting. No bleeding She is not taking the HCTZ,  apparently needing a refill, otherwise reported compliance with her medicines Unclear why, but taking amiodarone only 1/2 tab daily, hand written on the label is "1/2", she thought the pharmacy put it there  She was back in AF, rate controlled, symptoms felt 2/2 AF, advised to take amiodarone 200mg  BID x2 weeks and then daily, her HCTZ refilled. Planned for 2 week visit, if in AF plan DCCV  Admitted to [facility] 04/16/22 - 04/20/22 C/o CP/abd pain, back pain, cardiology consulted felt that her chest pain was secondary to microvascular angina  precipitated by uncontrolled hypertension. Patient has a history of nonadherence to antihypertensives. Coronary angiography on 01/19/2021 showed moderate diffuse coronary artery disease for which medical therapy recommended. Her blood pressure was controlled at discharge on a combination of amlodipine, carvedilol, hydralazine and isosorbide mononitrate. CT thorax abdomen and pelvis was done to  evaluate these multiple areas of pain. No acute pathology was found however a left adnexal cyst is noted. follow-up imaging in 6-12 months is recommended by radiology  EKG report: SB, Qtc 596 report only, no images 04/20/22 K+ 4.0 BUN/Creat 14/0.85 (1.04)   I saw her 04/27/22 The visit again supported by [name] our official translator The patient feels well since home from the hospital No ongoing CP, still has some low back pain No SOB, no palpitations No bleeding or signs of bleeding She is still only taking 1/2 tab of amiodarone (100mg ) daily, despite last visit and translator assistance  She was back in Afib but was not when she was in the hospital a week or so ago. QT is long on low dose amiodarone She is would like to discuss ablation with Dr. to see if he thinks she is a candidate   Planned  try to keep her on a very small dose of amiodarone for now, take 100mg  (1/2 tab) Monday-Friday, none on Sat/sun. Linda [name] has discussed this with her today and written it for her as well on the bottle She had junctional rhythm on her BB dose May land with rhythm control strategy vs pace/ablate if not an ablation candidate scheduled to see Dr. to see if felt an ablation candidate  She saw Dr. [name] 06/13/22, planned for DCCV to assess if symptomatically better in SR or not to help guide management strategies.  She arrived to her DCCV 07/11/22, the patient with much confusion regarding her medicines, despite attempts with her translator to help, she was apparently NOT taking Xarelto.  Planned to see the AFib clinic to try ans settle out meds/management  She saw 06/15/22 07/18/22, she was in SB 48, feeling better, on coreg 12.5mg  dosing Medicines all reviewed one-by-one with the patient Continue amiodarone 100 mg Mon-Fri.  Continue carvedilol 12.5 mg BID Continue Xarelto 15 mg daily  She saw [name], PA-C 08/11/22, feeling well, again went through all of her medications  confirming appropriate dose of each with her. BP was low and her lisinopril reduced.  *** xarelto, dose, labs, bleeding, compliance *** amio labs *** meds, taking ? Correctly?   AFib/AAD Hx Diagnosed quite remotely Propafenone, sotalol, details unclear Amiodarone started Feb 2022   Past Medical History:  Diagnosis Date   Atrial fibrillation (HCC)    dx in setting of stroke 4/12;  echo with EF 60-65%, mild LVH, mild AI, grade 1 diast dysfxn   Coronary artery disease    Diabetes mellitus without complication (HCC)    History of CVA (cerebrovascular accident)    New diagnosis December 08, 2010   Hx of cardiovascular stress test    a. Myoview 8/12:  EF 69%, no ischemia.    Hyperlipidemia    Hypertension    Impaired glucose tolerance 05/08/2011   Stroke (HCC) 2012   no deficits   Tobacco abuse    Ongoing (3-6 cigarettes per day, 25 pack year history)    Past Surgical History:  Procedure Laterality Date   CARDIOVERSION N/A 10/28/2020   Procedure: CARDIOVERSION;  Surgeon: 2013, MD;  Location: [facility] ENDOSCOPY;  Service: Cardiovascular;  Laterality: N/A;  COLONOSCOPY N/A 02/01/2016   Procedure: COLONOSCOPY;  Surgeon: Ruffin Frederick, MD;  Location: [facility] ENDOSCOPY;  Service: Gastroenterology;  Laterality: N/A;    Current Outpatient Medications  Medication Sig Dispense Refill   amiodarone (PACERONE) 200 MG tablet Take 1 tablet (200 mg total) by mouth daily. 90 tablet 3   atorvastatin (LIPITOR) 40 MG tablet Take 1 tablet (40 mg total) by mouth at bedtime. 90 tablet 3   carvedilol (COREG) 3.125 MG tablet Take 1 tablet (3.125 mg total) by mouth 2 (two) times daily. 180 tablet 3   hydrochlorothiazide (MICROZIDE) 12.5 MG capsule Take 1 capsule (12.5 mg total) by mouth daily. 90 capsule 2   lisinopril (ZESTRIL) 40 MG tablet Take 1 tablet (40 mg total) by mouth daily. 90 tablet 3   Rivaroxaban (XARELTO) 15 MG TABS tablet TAKE 1 TABLET BY MOUTH DAILY WITH SUPPER. PLEASE  KEEP UPCOMING APPOINTMENT FOR REFILLS 30 tablet 5   No current facility-administered medications for this visit.    Allergies:   Patient has no known allergies.   Social History:  The patient  reports that she quit smoking about 2 months ago. Her smoking use included cigarettes. She has a 6.25 pack-year smoking history. She has never used smokeless tobacco. She reports that she does not drink alcohol and does not use drugs.   Family History:  The patient's family history is not on file.  ROS:  Please see the history of present illness.    All other systems are reviewed and otherwise negative.   PHYSICAL EXAM:  VS:  There were no vitals taken for this visit. BMI: There is no height or weight on file to calculate BMI. Well nourished, well developed, in no acute distress HEENT: normocephalic, atraumatic Neck: no JVD, carotid bruits or masses Cardiac:  *** no significant murmurs, no rubs, or gallops Lungs:  *** CTA b/l, no wheezing, rhonchi or rales Abd: soft, nontender MS: no deformity, age appropriate atrophy Ext:  *** no edema Skin: warm and dry, no rash Neuro:  No gross deficits appreciated Psych: euthymic mood, full affect   EKG:  Done today and reviewed by myself shows ***  04/17/22: TTE (at [facility]) SUMMARY  The left ventricular size is normal.  There is mild concentric left ventricular hypertrophy.  Left ventricular systolic function is normal.  LV ejection fraction = 60-65%.  The right ventricle is normal in size and function.  There is moderate aortic regurgitation.  There is mild mitral regurgitation.  The aortic sinus is normal size.  IVC size was normal.  There is trivial pericardial effusion.  Probably no significant change in comparison with the prior study  noted    01/18/21; limited echo (at [facility]) SUMMARY  LAE, other chamber sizes normal  LVH with apex particularly hypertrophied; EF 55% without segmental  abnormality  mild to moderate AI; 3 leaflet  valve  mild TR  no effusion  proximal ascending aorta 36mm, enlarged when indexed.  Global LV systolic function is preserved   01/19/21: [facility] RECOMMENDATION:   Medical therapy for Coronary artery disease risk factor reduction and  Angina  Coronary Findings Diagnostic Dominance: Right  Left Main: Mid LM to Dist LM lesion is 30% stenosed.  Left Anterior Descending: Ost LAD to Prox LAD lesion is 20% stenosed. Mid LAD lesion is 15% stenosed. First Diagonal Branch: 1st Diag lesion is 30% stenosed.  Left Circumflex: Mid Cx lesion is 15% stenosed.  Right Coronary Artery: Prox RCA lesion is 35% stenosed. Dist RCA lesion  is 50% stenosed.   12/30/20; stress myoview The left ventricular ejection fraction is normal (55-65%). Nuclear stress EF: 60%. There was no ST segment deviation noted during stress. There is a small defect of moderate severity present in the apical inferior and apex location. The defect is partially reversible. There is significant extracardiac activity at near the apex on both rest and stress images. The defect is most consistent with extracardiac activity near the apex. In the setting of normal LVF, scar with peri-infarct ischemia is less likely. This is a low risk study.    August 02, 2020 echo Left ventricular function normal, 60% Mild LVH Right ventricular function normal Moderate aortic regurgitation   July 01, 2020 ZIO 21% burden of atrial fibrillation with average ventricular rate during A. fib of 134 bpm   Recent Labs: 03/03/2022: ALT 20; BUN 22; Creatinine, Ser 1.04; Hemoglobin 16.7; Platelets 288; Potassium 4.2; Sodium 144; TSH 3.600  No results found for requested labs within last 365 days.   CrCl cannot be calculated (Patient's most recent lab result is older than the maximum 21 days allowed.).   Wt Readings from Last 3 Encounters:  04/12/22 119 lb (54 kg)  03/17/22 120 lb (54.4 kg)  03/03/22 117 lb 12.8 oz (53.4 kg)     Other studies  reviewed: Additional studies/records reviewed today include: summarized above  ASSESSMENT AND PLAN:  Persistent Afib CHA2DS2Vasc is 8, on Xarelto, *** appropriately dosed *** burden by symptoms Bradycardia/junctional rhythm in the past   HTN *** Much better   3. HLD Not addressed today    Disposition: ***     Current medicines are reviewed at length with the patient today.  The patient did not have any concerns regarding medicines.  Norma Fredrickson, PA-C 04/23/2022 5:47 PM     [facility] [address] [place] [address] [phone] (office)  [phone] (fax)

## 2022-08-22 ENCOUNTER — Ambulatory Visit: Payer: Medicare Other | Admitting: Physician Assistant

## 2022-09-08 NOTE — Progress Notes (Signed)
   PCP:  Charlott Rakes, MD Primary Cardiologist: Candee Furbish, MD Electrophysiologist: Vickie Epley, MD   Linda [name] is a 82 y.o. female seen today for Vickie Epley, MD for routine electrophysiology followup. Since last being seen in our clinic the patient reports doing very well. Prior to cardioversion in the fall she felt "knocking" in her heart and increased SOB. None since.  she denies chest pain, palpitations, dyspnea, PND, orthopnea, nausea, vomiting, dizziness, syncope, edema, weight gain, or early satiety.   Past Medical History:  Diagnosis Date   Atrial fibrillation ([place])    dx in setting of stroke 4/12;  echo with EF 60-65%, mild LVH, mild AI, grade 1 diast dysfxn   Coronary artery disease    Diabetes mellitus without complication ([place])    History of CVA (cerebrovascular accident)    New diagnosis December 08, 2010   Hx of cardiovascular stress test    a. Myoview 8/12:  EF 69%, no ischemia.    Hyperlipidemia    Hypertension    Impaired glucose tolerance 05/08/2011   Stroke ([place]) 2012   no deficits   Tobacco abuse    Ongoing (3-6 cigarettes per day, 25 pack year history)    Current Outpatient Medications  Medication Instructions   amiodarone (PACERONE) 100 mg, Oral, Daily, MONDAY - FRIDAY AND NONE ON SATURDAY AND SUNDAY   amLODipine (NORVASC) 5 mg, Oral, Daily   atorvastatin (LIPITOR) 80 mg, Oral, Daily   carvedilol (COREG) 12.5 mg, Oral, 2 times daily with meals   hydrALAZINE (APRESOLINE) 50 mg, Oral, 3 times daily   isosorbide mononitrate (IMDUR) 60 mg, Oral, Daily   lisinopril (ZESTRIL) 10 mg, Oral, Daily   Rivaroxaban (XARELTO) 15 mg, Oral, Daily with supper   traMADol (ULTRAM) 50 mg, Oral, Every 12 hours PRN    Physical Exam: Vitals:   09/12/22 0812  BP: 124/60  Pulse: (!) 52  SpO2: 97%  Weight: 119 lb 12.8 oz (54.3 kg)  Height: 4\' 10"  (1.473 m)    GEN- NAD. A&O x 3. Normal affect. HEENT: normocephalic, atraumatic Lungs- CTAB, Normal  effort Heart- Regular rate and rhythm, No M/G/R Extremities- No peripheral edema. no clubbing or cyanosis; Skin- warm and dry, no rash or lesion  EKG is not ordered today. EKG from 06/2022 reviewed which showed sinus bradycardia at 48 bpm  Additional studies reviewed include: Previous EP notes.   Assessment and Plan:  1. Persistent Atrial fibrillation Slow and regular on exam.  Continue amiodarone 100 mg M-F. Refill. Labs today.  Continue coreg 12.5 mg BID Continue Xarelto 15 mg daily  2. HTN Stable on current regimen   Follow up with Dr. Quentin Ore in 6 months  Shirley Friar, [place]  09/12/22 8:36 AM

## 2022-09-12 ENCOUNTER — Ambulatory Visit: Payer: 59 | Attending: Physician Assistant | Admitting: Student

## 2022-09-12 VITALS — BP 124/60 | HR 52 | Ht <= 58 in | Wt 119.8 lb

## 2022-09-12 DIAGNOSIS — I5032 Chronic diastolic (congestive) heart failure: Secondary | ICD-10-CM | POA: Diagnosis not present

## 2022-09-12 DIAGNOSIS — I10 Essential (primary) hypertension: Secondary | ICD-10-CM

## 2022-09-12 DIAGNOSIS — I484 Atypical atrial flutter: Secondary | ICD-10-CM

## 2022-09-12 DIAGNOSIS — I4819 Other persistent atrial fibrillation: Secondary | ICD-10-CM | POA: Diagnosis not present

## 2022-09-12 LAB — COMPREHENSIVE METABOLIC PANEL
ALT: 17 IU/L (ref 0–32)
AST: 18 IU/L (ref 0–40)
Albumin/Globulin Ratio: 1.7 (ref 1.2–2.2)
Albumin: 4.4 g/dL (ref 3.7–4.7)
Alkaline Phosphatase: 81 IU/L (ref 44–121)
BUN/Creatinine Ratio: 13 (ref 12–28)
BUN: 14 mg/dL (ref 8–27)
Bilirubin Total: 0.6 mg/dL (ref 0.0–1.2)
CO2: 25 mmol/L (ref 20–29)
Calcium: 9.2 mg/dL (ref 8.7–10.3)
Chloride: 101 mmol/L (ref 96–106)
Creatinine, Ser: 1.04 mg/dL — ABNORMAL HIGH (ref 0.57–1.00)
Globulin, Total: 2.6 g/dL (ref 1.5–4.5)
Glucose: 126 mg/dL — ABNORMAL HIGH (ref 70–99)
Potassium: 4.4 mmol/L (ref 3.5–5.2)
Sodium: 141 mmol/L (ref 134–144)
Total Protein: 7 g/dL (ref 6.0–8.5)
eGFR: 54 mL/min/{1.73_m2} — ABNORMAL LOW (ref >59)

## 2022-09-12 LAB — T4, FREE: Free T4: 1.73 ng/dL (ref 0.82–1.77)

## 2022-09-12 LAB — TSH: TSH: 2.64 u[IU]/mL (ref 0.450–4.500)

## 2022-09-12 MED ORDER — AMIODARONE HCL 200 MG PO TABS: 100.0000 mg | ORAL_TABLET | Freq: Every day | ORAL | 3 refills | Status: DC

## 2022-09-12 NOTE — Patient Instructions (Signed)
Medication Instructions:   Your physician recommends that you continue on your current medications as directed. Please refer to the Current Medication list given to you today.   *If you need a refill on your cardiac medications before your next appointment, please call your pharmacy*   Lab Work:  TODAY!!!! CMET/TSH/FREE T4  If you have labs (blood work) drawn today and your tests are completely normal, you will receive your results only by: [place] (if you have MyChart) OR A paper copy in the mail If you have any lab test that is abnormal or we need to change your treatment, we will call you to review the results.   Testing/Procedures:  None ordered.   Follow-Up: At [facility], you and your health needs are our priority.  As part of our continuing mission to provide you with exceptional heart care, we have created designated Provider Care Teams.  These Care Teams include your primary Cardiologist (physician) and Advanced Practice Providers (APPs -  Physician Assistants and Nurse Practitioners) who all work together to provide you with the care you need, when you need it.  We recommend signing up for the patient portal called "MyChart".  Sign up information is provided on this After Visit Summary.  MyChart is used to connect with patients for Virtual Visits (Telemedicine).  Patients are able to view lab/test results, encounter notes, upcoming appointments, etc.  Non-urgent messages can be sent to your provider as well.   To learn more about what you can do with MyChart, go to [email or web address].    Your next appointment:   6 month(s)  Provider:   Lars Mage, MD    Other Instructions  Your physician wants you to follow-up in: 6 months with Dr. Quentin Ore.  You will receive a reminder letter in the mail two months in advance. If you don't receive a letter, please call our office to schedule the follow-up appointment.

## 2022-10-20 ENCOUNTER — Ambulatory Visit: Payer: 59 | Attending: Cardiology | Admitting: Cardiology

## 2022-10-20 ENCOUNTER — Encounter: Payer: Self-pay | Admitting: Cardiology

## 2022-10-20 VITALS — BP 122/72 | HR 67 | Ht <= 58 in | Wt 120.2 lb

## 2022-10-20 DIAGNOSIS — I5032 Chronic diastolic (congestive) heart failure: Secondary | ICD-10-CM

## 2022-10-20 DIAGNOSIS — I4819 Other persistent atrial fibrillation: Secondary | ICD-10-CM | POA: Diagnosis not present

## 2022-10-20 NOTE — Progress Notes (Signed)
Cardiology Office Note:    Date:  10/21/2022   ID:  Linda [name], DOB May 01, 1941, MRN [id number]  PCP:  Linda Rakes, MD  [facility] Cardiologist:  Linda Furbish, MD  [facility] Electrophysiologist:  Linda Epley, MD   Referring MD: Linda Rakes, MD     History of Present Illness:    Linda [name] is a 82 y.o. female here for follow-up of persistent atrial fibrillation. Has coronary disease diabetes prior 2X embolic stroke with AFIB hypertension dilated aorta tobacco use persistent atrial fibrillation.  She had been lost to follow-up with Dr. Caryl [name] from 2018.  Now sees EP with Dr. Quentin [name].  CHA2DS2-VASc score is 8.  Prior hospitalization 09/21/2020 for hypertensive urgency.  DC cardioversion 10/28/2020.  Recurrent atrial fibrillation, recommended repeat cardioversion however she was noncompliant with Xarelto.  Ultimately converted to sinus bradycardia continuing low-dose amiodarone 100 mg Monday through Friday as well as carvedilol 12.5 mg twice a day.  4cm ascending aortic aneurysm.  No prior MI  Has intermittent shortness of breath palpitations 2-3 times a week symptoms usually last about 1 minute.  In review of atrial fibrillation clinic note she was in sinus rhythm during that encounter on 06/02/2020.  Translator for help.  Overall feels well without any chest pain fevers chills nausea vomiting syncope bleeding.  Rash on hands noted.  After showers.     Past Medical History:  Diagnosis Date   Atrial fibrillation ([place])    dx in setting of stroke 4/12;  echo with EF 60-65%, mild LVH, mild AI, grade 1 diast dysfxn   Coronary artery disease    Diabetes mellitus without complication ([place])    History of CVA (cerebrovascular accident)    New diagnosis December 08, 2010   Hx of cardiovascular stress test    a. Myoview 8/12:  EF 69%, no ischemia.    Hyperlipidemia    Hypertension    Impaired glucose tolerance 05/08/2011   Stroke ([place]) 2012   no deficits   Tobacco  abuse    Ongoing (3-6 cigarettes per day, 25 pack year history)    Past Surgical History:  Procedure Laterality Date   CARDIOVERSION N/A 10/28/2020   Procedure: CARDIOVERSION;  Surgeon: Linda Heinz, MD;  Location: [facility] ENDOSCOPY;  Service: Cardiovascular;  Laterality: N/A;   COLONOSCOPY N/A 02/01/2016   Procedure: COLONOSCOPY;  Surgeon: Linda Gunning, MD;  Location: [facility] ENDOSCOPY;  Service: Gastroenterology;  Laterality: N/A;    Current Medications: Current Meds  Medication Sig   amiodarone (PACERONE) 200 MG tablet Take 0.5 tablets (100 mg total) by mouth daily. MONDAY - FRIDAY AND NONE ON SATURDAY AND SUNDAY   amLODipine (NORVASC) 5 MG tablet Take 1 tablet (5 mg total) by mouth daily.   atorvastatin (LIPITOR) 80 MG tablet Take 1 tablet (80 mg total) by mouth daily.   carvedilol (COREG) 12.5 MG tablet Take 12.5 mg by mouth 2 (two) times daily with a meal.   hydrALAZINE (APRESOLINE) 50 MG tablet Take 1 tablet (50 mg total) by mouth 3 (three) times daily.   isosorbide mononitrate (IMDUR) 60 MG 24 hr tablet Take 1 tablet (60 mg total) by mouth daily.   lisinopril (ZESTRIL) 10 MG tablet Take 1 tablet (10 mg total) by mouth daily.   Rivaroxaban (XARELTO) 15 MG TABS tablet Take 1 tablet (15 mg total) by mouth daily with supper.   traMADol (ULTRAM) 50 MG tablet Take 50 mg by mouth every 12 (twelve) hours as needed for severe pain.  Allergies:   Patient has no known allergies.   Social History   Socioeconomic History   Marital status: Widowed    Spouse name: Not on file   Number of children: Not on file   Years of education: Not on file   Highest education level: Not on file  Occupational History    Employer: [name]   Occupation: Not working  Tobacco Use   Smoking status: Former    Packs/day: 0.25    Years: 25.00    Total pack years: 6.25    Types: Cigarettes    Quit date: 02/15/2022    Years since quitting: 0.6   Smokeless tobacco: Never   Tobacco  comments:    Former smoker 07/18/22  Substance and Sexual Activity   Alcohol use: No   Drug use: No   Sexual activity: Not on file  Other Topics Concern   Not on file  Social History Narrative   Lives in [place] with her daughters   Regular diet   No regular exercise but active and independent with all daily activities   Social Determinants of Health   Financial Resource Strain: Not on file  Food Insecurity: Not on file  Transportation Needs: Not on file  Physical Activity: Not on file  Stress: Not on file  Social Connections: Not on file     Family History: The patient's family history is negative for Coronary artery disease.  ROS:   Please see the history of present illness.   No fevers chills nausea denies any fevers chills nausea vomiting syncope bleeding  All other systems reviewed and are negative.  EKGs/Labs/Other Studies Reviewed:    The following studies were reviewed today  Cardiac Studies & Procedures     STRESS TESTS  MYOCARDIAL PERFUSION IMAGING 12/30/2020  Narrative  The left ventricular ejection fraction is normal (55-65%).  Nuclear stress EF: 60%.  There was no ST segment deviation noted during stress.  There is a small defect of moderate severity present in the apical inferior and apex location. The defect is partially reversible. There is significant extracardiac activity at near the apex on both rest and stress images. The defect is most consistent with extracardiac activity near the apex. In the setting of normal LVF, scar with peri-infarct ischemia is less likely.  This is a low risk study.   ECHOCARDIOGRAM  ECHOCARDIOGRAM COMPLETE 08/02/2020  Narrative ECHOCARDIOGRAM REPORT    Patient Name:   Linda [name] [facility]   Date of Exam: 08/02/2020 Medical Rec #:  [id number]     Height:       60.0 in Accession #:    [id number]    Weight:       119.0 lb Date of Birth:  1940/09/23    BSA:          1.497 m Patient Age:    37 years      BP:            160/78 mmHg Patient Gender: F             HR:           126 bpm. Exam Location:  [address]  Procedure: 2D Echo, Cardiac Doppler and Color Doppler  Indications:    I48.91 Atrial Fibrillation  History:        Patient has prior history of Echocardiogram examinations, most recent 04/03/2018. Risk Factors:Hypertension, Diabetes, Dyslipidemia and Current Smoker. CVA. Coronary artery disease. Atrial Fibrillation.  Sonographer:    NaTashia Rodgers-Jones RDCS  Referring Phys: [id number] [place]   1. Left ventricular ejection fraction, by estimation, is 60 to 65%. The left ventricle has normal function. The left ventricle has no regional wall motion abnormalities. There is mild left ventricular hypertrophy. Left ventricular diastolic parameters are consistent with Grade I diastolic dysfunction (impaired relaxation). 2. Right ventricular systolic function is normal. The right ventricular size is normal. 3. The mitral valve is normal in structure. Trivial mitral valve regurgitation. No evidence of mitral stenosis. 4. The aortic valve is tricuspid. Aortic valve regurgitation is moderate. No aortic stenosis is present. Aortic regurgitation PHT measures 566 msec. 5. Aortic dilatation noted. There is mild dilatation of the ascending aorta, measuring 39 mm. 6. The inferior vena cava is normal in size with greater than 50% respiratory variability, suggesting right atrial pressure of 3 mmHg.  FINDINGS Left Ventricle: Left ventricular ejection fraction, by estimation, is 60 to 65%. The left ventricle has normal function. The left ventricle has no regional wall motion abnormalities. The left ventricular internal cavity size was normal in size. There is mild left ventricular hypertrophy. Left ventricular diastolic parameters are consistent with Grade I diastolic dysfunction (impaired relaxation).  Right Ventricle: The right ventricular size is normal. No increase in right ventricular wall  thickness. Right ventricular systolic function is normal.  Left Atrium: Left atrial size was normal in size.  Right Atrium: Right atrial size was normal in size.  Pericardium: There is no evidence of pericardial effusion.  Mitral Valve: The mitral valve is normal in structure. Trivial mitral valve regurgitation. No evidence of mitral valve stenosis.  Tricuspid Valve: The tricuspid valve is normal in structure. Tricuspid valve regurgitation is mild . No evidence of tricuspid stenosis.  Aortic Valve: The aortic valve is tricuspid. Aortic valve regurgitation is moderate. Aortic regurgitation PHT measures 566 msec. No aortic stenosis is present.  Pulmonic Valve: The pulmonic valve was normal in structure. Pulmonic valve regurgitation is not visualized. No evidence of pulmonic stenosis.  Aorta: Aortic dilatation noted. There is mild dilatation of the ascending aorta, measuring 39 mm.  Venous: The inferior vena cava is normal in size with greater than 50% respiratory variability, suggesting right atrial pressure of 3 mmHg.  IAS/Shunts: No atrial level shunt detected by color flow Doppler.   LEFT VENTRICLE PLAX 2D LVIDd:         3.70 cm LVIDs:         2.75 cm LV PW:         1.00 cm LV IVS:        1.20 cm LVOT diam:     1.90 cm LV SV:         31 LV SV Index:   21 LVOT Area:     2.84 cm   RIGHT VENTRICLE             IVC RV Basal diam:  3.50 cm     IVC diam: 1.40 cm RV S prime:     10.42 cm/s TAPSE (M-mode): 1.4 cm  LEFT ATRIUM             Index       RIGHT ATRIUM           Index LA diam:        4.10 cm 2.74 cm/m  RA Area:     15.00 cm LA Vol (A2C):   61.9 ml 41.35 ml/m RA Volume:   40.10 ml  26.79 ml/m LA Vol (A4C):   39.0 ml  26.05 ml/m LA Biplane Vol: 53.6 ml 35.80 ml/m AORTIC VALVE LVOT Vmax:   87.90 cm/s LVOT Vmean:  66.500 cm/s LVOT VTI:    0.109 m AI PHT:      566 msec  AORTA Ao Root diam: 3.10 cm Ao Asc diam:  3.90 cm  MV E velocity: 109.33 cm/s  TRICUSPID  VALVE TR Peak grad:   18.1 mmHg TR Vmax:        213.00 cm/s  SHUNTS Systemic VTI:  0.11 m Systemic Diam: 1.90 cm  Linda Furbish MD Electronically signed by Linda Furbish MD Signature Date/Time: 08/02/2020/1:18:50 PM    Final    MONITORS  LONG TERM MONITOR (3-14 DAYS) 07/01/2020  Narrative Indication: afib burden  Duration: 14d  Findings HR  avg 65  Min 48-Max 108 PVCs Rare, less than 1% PACs Rare, less than 1%  Afib burden 21% with average AF rate of 134;  Recommendations HR control increased today by Dr MS in clinic            CT scan of chest 05/04/2020 IMPRESSION: No measurable thoracic aortic aneurysm currently. Maximum diameter of the ascending thoracic aorta 3.8 cm.   No acute cardiopulmonary disease.   Aortic Atherosclerosis (ICD10-I70.0).   Cholelithiasis.     Electronically Signed   By: Rolm Baptise M.D.  EKG: 06/02/2020-sinus with PACs.  Zio patch monitor showed 21% burden of atrial fibrillation-A. fib heart rate was 136 bpm on average.  Recent Labs: 03/03/2022: Hemoglobin 16.7; Platelets 288 09/12/2022: ALT 17; BUN 14; Creatinine, Ser 1.04; Potassium 4.4; Sodium 141; TSH 2.640  Recent Lipid Panel    Component Value Date/Time   CHOL 153 07/01/2020 1146   TRIG 199 (H) 07/01/2020 1146   HDL 38 (L) 07/01/2020 1146   CHOLHDL 4.0 07/01/2020 1146   CHOLHDL 5.3 10/13/2016 0058   VLDL 20 10/13/2016 0058   LDLCALC 81 07/01/2020 1146   LDLDIRECT 124.5 08/07/2012 1128     Risk Assessment/Calculations:     CHA2DS2-VASc Score = 8  This indicates a 10.8% annual risk of stroke. The patient's score is based upon: CHF History: 0 HTN History: 1 Diabetes History: 1 Stroke History: 2 Vascular Disease History: 1 Age Score: 2 Gender Score: 1      Physical Exam:    VS:  BP 122/72   Pulse 67   Ht '4\' 10"'$  (1.473 m)   Wt 120 lb 3.2 oz (54.5 kg)   SpO2 98%   BMI 25.12 kg/m     Wt Readings from Last 3 Encounters:  10/20/22 120 lb 3.2 oz (54.5  kg)  09/12/22 119 lb 12.8 oz (54.3 kg)  08/11/22 119 lb 9.6 oz (54.3 kg)     GEN:  Well nourished, well developed in no acute distress HEENT: Normal NECK: No JVD; No carotid bruits LYMPHATICS: No lymphadenopathy CARDIAC: RRR, no murmurs, rubs, gallops RESPIRATORY:  Clear to auscultation without rales, wheezing or rhonchi  ABDOMEN: Soft, non-tender, non-distended MUSCULOSKELETAL:  No edema; No deformity  SKIN: Warm and dry NEUROLOGIC:  Alert and oriented x 3 PSYCHIATRIC:  Normal affect   ASSESSMENT:    1. Persistent atrial fibrillation ([place])   2. Chronic diastolic CHF (congestive heart failure) (HCC)     PLAN:    In order of problems listed above:  Persistent atrial fibrillation Essential hypertension uncontrolled Hyperlipidemia Chronic anticoagulation Ascending aortic aneurysm History of COVID October 2023  -Was in sinus rhythm during A. fib clinic visit. -ZIO was used on 06/28/2020 that showed 21%  atrial fibrillation burden with average heart rate of 130 bpm in atrial fibrillation. - On amiodarone 100 mg Monday through Friday per EP direction.  Monitoring labs for high risk medication.  On 09/12/2022 creatinine was 1.04 ALT was normal at 17 TSH and free T4 were normal. -On Coreg 12.5 mg twice a day - lisinopril 10 mg a day.  Imdur -We will check an echocardiogram. -Continue Xarelto 15 mg daily dose adjusted -Continue atorvastatin 20 -40 mm ascending aortic aneurysm.  Dr. Roxan Hockey had been monitoring.  We will optimize blood pressure control.   Overall been doing quite well.  Rash on palms of hands base - Lotion.  He she is going to discuss with primary care physician.   Shared Decision Making/Informed Consent      Medication Adjustments/Labs and Tests Ordered: Current medicines are reviewed at length with the patient today.  Concerns regarding medicines are outlined above.  Orders Placed This Encounter  Procedures   ECHOCARDIOGRAM COMPLETE   No orders of  the defined types were placed in this encounter.   Patient Instructions  Medication Instructions:  The current medical regimen is effective;  continue present plan and medications.  *If you need a refill on your cardiac medications before your next appointment, please call your pharmacy*  Testing/Procedures: Your physician has requested that you have an echocardiogram. Echocardiography is a painless test that uses sound waves to create images of your heart. It provides your doctor with information about the size and shape of your heart and how well your heart's chambers and valves are working. This procedure takes approximately one hour. There are no restrictions for this procedure. Please do NOT wear cologne, perfume, aftershave, or lotions (deodorant is allowed). Please arrive 15 minutes prior to your appointment time.  Follow-Up: At [facility], you and your health needs are our priority.  As part of our continuing mission to provide you with exceptional heart care, we have created designated Provider Care Teams.  These Care Teams include your primary Cardiologist (physician) and Advanced Practice Providers (APPs -  Physician Assistants and Nurse Practitioners) who all work together to provide you with the care you need, when you need it.  We recommend signing up for the patient portal called "MyChart".  Sign up information is provided on this After Visit Summary.  MyChart is used to connect with patients for Virtual Visits (Telemedicine).  Patients are able to view lab/test results, encounter notes, upcoming appointments, etc.  Non-urgent messages can be sent to your provider as well.   To learn more about what you can do with MyChart, go to [email or web address].    Your next appointment:   6 month(s)  Provider:   Candee Furbish, MD        Signed, Linda Furbish, MD  10/21/2022 6:50 AM    [place]

## 2022-10-20 NOTE — Patient Instructions (Signed)
Medication Instructions:  The current medical regimen is effective;  continue present plan and medications.  *If you need a refill on your cardiac medications before your next appointment, please call your pharmacy*  Testing/Procedures: Your physician has requested that you have an echocardiogram. Echocardiography is a painless test that uses sound waves to create images of your heart. It provides your doctor with information about the size and shape of your heart and how well your heart's chambers and valves are working. This procedure takes approximately one hour. There are no restrictions for this procedure. Please do NOT wear cologne, perfume, aftershave, or lotions (deodorant is allowed). Please arrive 15 minutes prior to your appointment time.  Follow-Up: At [facility], you and your health needs are our priority.  As part of our continuing mission to provide you with exceptional heart care, we have created designated Provider Care Teams.  These Care Teams include your primary Cardiologist (physician) and Advanced Practice Providers (APPs -  Physician Assistants and Nurse Practitioners) who all work together to provide you with the care you need, when you need it.  We recommend signing up for the patient portal called "MyChart".  Sign up information is provided on this After Visit Summary.  MyChart is used to connect with patients for Virtual Visits (Telemedicine).  Patients are able to view lab/test results, encounter notes, upcoming appointments, etc.  Non-urgent messages can be sent to your provider as well.   To learn more about what you can do with MyChart, go to [email or web address].    Your next appointment:   6 month(s)  Provider:   Candee Furbish, MD

## 2022-11-08 ENCOUNTER — Ambulatory Visit: Payer: 59 | Admitting: Student

## 2022-11-21 ENCOUNTER — Ambulatory Visit: Payer: 59 | Admitting: Student

## 2022-11-21 ENCOUNTER — Ambulatory Visit (HOSPITAL_COMMUNITY): Payer: 59 | Attending: Cardiology

## 2022-11-21 ENCOUNTER — Encounter (HOSPITAL_COMMUNITY): Payer: Self-pay | Admitting: Cardiology

## 2022-11-21 NOTE — Progress Notes (Deleted)
    SUBJECTIVE:   CHIEF COMPLAINT / HPI:   A-fib  CHF Patient follows with cardiology, last saw Dr. Marlou Porch 10/20/2022.  Also follows with EP.  On amiodarone 100 mg M-F, Coreg 12.5 mg twice daily, lisinopril 10 mg, Imdur 60 mg, Xarelto 15 mg  HTN Takes amlodipine 5 mg, lisinopril 10 mg, hydralazine 50 mg 3 times daily, Imdur 60 mg    PERTINENT  PMH / PSH: a fib on Xarelto, hx CVA, HTN, CHF, DM, HLD, tobacco use,   OBJECTIVE:   There were no vitals taken for this visit. ***  General: NAD, pleasant, able to participate in exam Cardiac: RRR, no murmurs. Respiratory: CTAB, normal effort, No wheezes, rales or rhonchi Abdomen: Bowel sounds present, nontender, nondistended, no hepatosplenomegaly. Extremities: no edema or cyanosis. Skin: warm and dry, no rashes noted Neuro: alert, no obvious focal deficits Psych: Normal affect and mood  ASSESSMENT/PLAN:   No problem-specific Assessment & Plan notes found for this encounter.     Dr. Precious Gilding, [place]    {    This will disappear when note is signed, click to select method of visit    :1}

## 2022-12-22 ENCOUNTER — Inpatient Hospital Stay (HOSPITAL_COMMUNITY)
Admission: EM | Admit: 2022-12-22 | Discharge: 2022-12-25 | DRG: 378 | Disposition: A | Discharge status: Home Health | Payer: 59 | Attending: Family Medicine | Admitting: Family Medicine

## 2022-12-22 ENCOUNTER — Other Ambulatory Visit: Payer: Self-pay

## 2022-12-22 DIAGNOSIS — E785 Hyperlipidemia, unspecified: Secondary | ICD-10-CM | POA: Diagnosis present

## 2022-12-22 DIAGNOSIS — E669 Obesity, unspecified: Secondary | ICD-10-CM | POA: Diagnosis present

## 2022-12-22 DIAGNOSIS — I351 Nonrheumatic aortic (valve) insufficiency: Secondary | ICD-10-CM | POA: Diagnosis present

## 2022-12-22 DIAGNOSIS — K920 Hematemesis: Secondary | ICD-10-CM

## 2022-12-22 DIAGNOSIS — I10 Essential (primary) hypertension: Secondary | ICD-10-CM | POA: Diagnosis present

## 2022-12-22 DIAGNOSIS — I4891 Unspecified atrial fibrillation: Secondary | ICD-10-CM | POA: Diagnosis present

## 2022-12-22 DIAGNOSIS — I110 Hypertensive heart disease with heart failure: Secondary | ICD-10-CM | POA: Diagnosis present

## 2022-12-22 DIAGNOSIS — K921 Melena: Secondary | ICD-10-CM | POA: Diagnosis present

## 2022-12-22 DIAGNOSIS — D6832 Hemorrhagic disorder due to extrinsic circulating anticoagulants: Secondary | ICD-10-CM | POA: Diagnosis present

## 2022-12-22 DIAGNOSIS — K259 Gastric ulcer, unspecified as acute or chronic, without hemorrhage or perforation: Secondary | ICD-10-CM

## 2022-12-22 DIAGNOSIS — R1013 Epigastric pain: Secondary | ICD-10-CM

## 2022-12-22 DIAGNOSIS — I5032 Chronic diastolic (congestive) heart failure: Secondary | ICD-10-CM | POA: Diagnosis present

## 2022-12-22 DIAGNOSIS — K254 Chronic or unspecified gastric ulcer with hemorrhage: Secondary | ICD-10-CM | POA: Diagnosis not present

## 2022-12-22 DIAGNOSIS — T45515A Adverse effect of anticoagulants, initial encounter: Secondary | ICD-10-CM | POA: Diagnosis present

## 2022-12-22 DIAGNOSIS — F17210 Nicotine dependence, cigarettes, uncomplicated: Secondary | ICD-10-CM | POA: Diagnosis present

## 2022-12-22 DIAGNOSIS — I959 Hypotension, unspecified: Secondary | ICD-10-CM | POA: Diagnosis not present

## 2022-12-22 DIAGNOSIS — Z6823 Body mass index (BMI) 23.0-23.9, adult: Secondary | ICD-10-CM

## 2022-12-22 DIAGNOSIS — R079 Chest pain, unspecified: Secondary | ICD-10-CM

## 2022-12-22 DIAGNOSIS — R944 Abnormal results of kidney function studies: Secondary | ICD-10-CM | POA: Diagnosis present

## 2022-12-22 DIAGNOSIS — I2510 Atherosclerotic heart disease of native coronary artery without angina pectoris: Secondary | ICD-10-CM | POA: Diagnosis present

## 2022-12-22 DIAGNOSIS — K8020 Calculus of gallbladder without cholecystitis without obstruction: Secondary | ICD-10-CM | POA: Diagnosis present

## 2022-12-22 DIAGNOSIS — R000 Tachycardia, unspecified: Secondary | ICD-10-CM | POA: Diagnosis not present

## 2022-12-22 DIAGNOSIS — Z8673 Personal history of transient ischemic attack (TIA), and cerebral infarction without residual deficits: Secondary | ICD-10-CM

## 2022-12-22 DIAGNOSIS — Z79899 Other long term (current) drug therapy: Secondary | ICD-10-CM

## 2022-12-22 DIAGNOSIS — I712 Thoracic aortic aneurysm, without rupture, unspecified: Secondary | ICD-10-CM | POA: Diagnosis present

## 2022-12-22 DIAGNOSIS — K922 Gastrointestinal hemorrhage, unspecified: Secondary | ICD-10-CM | POA: Diagnosis present

## 2022-12-22 DIAGNOSIS — E119 Type 2 diabetes mellitus without complications: Secondary | ICD-10-CM | POA: Diagnosis present

## 2022-12-22 DIAGNOSIS — D62 Acute posthemorrhagic anemia: Secondary | ICD-10-CM | POA: Diagnosis present

## 2022-12-22 DIAGNOSIS — I4819 Other persistent atrial fibrillation: Secondary | ICD-10-CM | POA: Diagnosis present

## 2022-12-22 LAB — CBC
HCT: 27.3 % — ABNORMAL LOW (ref 36.0–46.0)
Hemoglobin: 9.2 g/dL — ABNORMAL LOW (ref 12.0–15.0)
MCH: 30.2 pg (ref 26.0–34.0)
MCHC: 33.7 g/dL (ref 30.0–36.0)
MCV: 89.5 fL (ref 80.0–100.0)
Platelets: 185 10*3/uL (ref 150–400)
RBC: 3.05 MIL/uL — ABNORMAL LOW (ref 3.87–5.11)
RDW: 13.2 % (ref 11.5–15.5)
WBC: 15.4 10*3/uL — ABNORMAL HIGH (ref 4.0–10.5)
nRBC: 0 % (ref 0.0–0.2)

## 2022-12-22 LAB — COMPREHENSIVE METABOLIC PANEL
ALT: 19 U/L (ref 0–44)
AST: 17 U/L (ref 15–41)
Albumin: 3.1 g/dL — ABNORMAL LOW (ref 3.5–5.0)
Alkaline Phosphatase: 48 U/L (ref 38–126)
Anion gap: 8 (ref 5–15)
BUN: 63 mg/dL — ABNORMAL HIGH (ref 8–23)
CO2: 25 mmol/L (ref 22–32)
Calcium: 8.1 mg/dL — ABNORMAL LOW (ref 8.9–10.3)
Chloride: 107 mmol/L (ref 98–111)
Creatinine, Ser: 1.12 mg/dL — ABNORMAL HIGH (ref 0.44–1.00)
GFR, Estimated: 49 mL/min — ABNORMAL LOW (ref >60)
Glucose, Bld: 136 mg/dL — ABNORMAL HIGH (ref 70–99)
Potassium: 3.9 mmol/L (ref 3.5–5.1)
Sodium: 140 mmol/L (ref 135–145)
Total Bilirubin: 0.7 mg/dL (ref 0.3–1.2)
Total Protein: 5.5 g/dL — ABNORMAL LOW (ref 6.5–8.1)

## 2022-12-22 LAB — TROPONIN I (HIGH SENSITIVITY): Troponin I (High Sensitivity): 6 ng/L (ref <18)

## 2022-12-22 LAB — PROTIME-INR
INR: 1.3 — ABNORMAL HIGH (ref 0.8–1.2)
Prothrombin Time: 15.9 seconds — ABNORMAL HIGH (ref 11.4–15.2)

## 2022-12-22 LAB — CBC WITH DIFFERENTIAL/PLATELET
Abs Immature Granulocytes: 0.1 10*3/uL — ABNORMAL HIGH (ref 0.00–0.07)
Basophils Absolute: 0 10*3/uL (ref 0.0–0.1)
Basophils Relative: 0 %
Eosinophils Absolute: 0 10*3/uL (ref 0.0–0.5)
Eosinophils Relative: 0 %
HCT: 31 % — ABNORMAL LOW (ref 36.0–46.0)
Hemoglobin: 10 g/dL — ABNORMAL LOW (ref 12.0–15.0)
Immature Granulocytes: 1 %
Lymphocytes Relative: 22 %
Lymphs Abs: 3 10*3/uL (ref 0.7–4.0)
MCH: 29.2 pg (ref 26.0–34.0)
MCHC: 32.3 g/dL (ref 30.0–36.0)
MCV: 90.6 fL (ref 80.0–100.0)
Monocytes Absolute: 1.2 10*3/uL — ABNORMAL HIGH (ref 0.1–1.0)
Monocytes Relative: 9 %
Neutro Abs: 9.3 10*3/uL — ABNORMAL HIGH (ref 1.7–7.7)
Neutrophils Relative %: 68 %
Platelets: 230 10*3/uL (ref 150–400)
RBC: 3.42 MIL/uL — ABNORMAL LOW (ref 3.87–5.11)
RDW: 13.1 % (ref 11.5–15.5)
WBC: 13.7 10*3/uL — ABNORMAL HIGH (ref 4.0–10.5)
nRBC: 0 % (ref 0.0–0.2)

## 2022-12-22 LAB — LACTIC ACID, PLASMA
Lactic Acid, Venous: 2.5 mmol/L (ref 0.5–1.9)
Lactic Acid, Venous: 1.5 mmol/L (ref 0.5–1.9)

## 2022-12-22 LAB — LIPASE, BLOOD: Lipase: 45 U/L (ref 11–51)

## 2022-12-22 MED ORDER — AMIODARONE HCL 200 MG PO TABS: 100.0000 mg | ORAL_TABLET | Freq: Every day | ORAL | Status: DC

## 2022-12-22 MED ORDER — SODIUM CHLORIDE 0.9 % IV SOLN
1.0000 g | Freq: Once | INTRAVENOUS | Status: AC
  Administered 2022-12-22: 1 g via INTRAVENOUS
  Filled 2022-12-22: qty 10

## 2022-12-22 MED ORDER — LACTATED RINGERS IV BOLUS
1000.0000 mL | Freq: Once | INTRAVENOUS | Status: AC
  Administered 2022-12-22: 1000 mL via INTRAVENOUS

## 2022-12-22 MED ORDER — ONDANSETRON HCL 4 MG/2ML IJ SOLN: 4.0000 mg | Freq: Four times a day (QID) | INTRAMUSCULAR | Status: DC | PRN

## 2022-12-22 MED ORDER — PANTOPRAZOLE SODIUM 40 MG IV SOLR
40.0000 mg | Freq: Two times a day (BID) | INTRAVENOUS | Status: DC
  Administered 2022-12-22 – 2022-12-24 (×5): 40 mg via INTRAVENOUS
  Filled 2022-12-22 (×5): qty 10

## 2022-12-22 MED ORDER — AMIODARONE HCL 200 MG PO TABS
100.0000 mg | ORAL_TABLET | Freq: Every day | ORAL | Status: DC
  Administered 2022-12-25: 100 mg via ORAL
  Filled 2022-12-22: qty 1

## 2022-12-22 MED ORDER — CARVEDILOL 12.5 MG PO TABS: 12.5000 mg | ORAL_TABLET | Freq: Two times a day (BID) | ORAL | Status: DC

## 2022-12-22 NOTE — Assessment & Plan Note (Addendum)
Likely upper GI bleed in setting of chronic Xarelto. Hemodynamically stable.  Hgb 10.0 > 9.2 > 8.7 this morning. Monitor closely. No further melanotic episodes. No hematochezia this AM. GI consulted, plan for EGD this morning pending anesthesia availability. - GI consulted, appreciate recommendations -- NPO - Continue IV Protonix -- S/p IV CTX for possible varices, continue if appropriate per GI - Monitor BP closely, MAP goal >65 -- Transfusion threshold Hgb < 8.0, or hemodynamic instability - CBC every 6 hours

## 2022-12-22 NOTE — ED Notes (Signed)
Date and time results received: 12/22/22  (use smartphrase ".now" to insert current time)  Test: Lactic Acid  Critical Value: 2.5  Name of Provider Notified: Glyn Ade MD  Orders Received? Or Actions Taken?: Orders Received - See Orders for details

## 2022-12-22 NOTE — ED Notes (Signed)
ED TO INPATIENT HANDOFF REPORT  ED Nurse Name and Phone #: Gillis Ends [phone]  S Name/Age/Gender Linda [name] 82 y.o. female Room/Bed: 003C/003C  Code Status   Code Status: Prior  Home/SNF/Other Home Patient oriented to: self, place, time, and situation Is this baseline? Yes   Triage Complete: Triage complete  Chief Complaint GI bleed [K92.2]  Triage Note Pt coming from home via EMS with 6-7 episodes of black tarry stools, and black emesis. This has all occurred today. Pt is a&ox4, pale, cool, diaphoretic. Pt denies any other pain or discomfort. Pt is on Xarelto.   Allergies No Known Allergies  Level of Care/Admitting Diagnosis ED Disposition     ED Disposition  Admit   Condition  --   Comment  Hospital Area: [facility] [[id number]]  Level of Care: Progressive [102]  Admit to Progressive based on following criteria: GI, ENDOCRINE disease patients with GI bleeding, acute liver failure or pancreatitis, stable with diabetic ketoacidosis or thyrotoxicosis (hypothyroid) state.  May place patient in observation at [facility] or [facility] if equivalent level of care is available:: No  Covid Evaluation: Asymptomatic - no recent exposure (last 10 days) testing not required  Diagnosis: GI bleed [960454]  Admitting Physician: Tiffany Kocher [[id number]]  Attending Physician: Westley Chandler [[id number]]          B Medical/Surgery History Past Medical History:  Diagnosis Date   Atrial fibrillation (HCC)    dx in setting of stroke 4/12;  echo with EF 60-65%, mild LVH, mild AI, grade 1 diast dysfxn   Coronary artery disease    Diabetes mellitus without complication (HCC)    History of CVA (cerebrovascular accident)    New diagnosis December 08, 2010   Hx of cardiovascular stress test    a. Myoview 8/12:  EF 69%, no ischemia.    Hyperlipidemia    Hypertension    Impaired glucose tolerance 05/08/2011   Stroke (HCC) 2012   no deficits   Tobacco abuse    Ongoing  (3-6 cigarettes per day, 25 pack year history)   Past Surgical History:  Procedure Laterality Date   CARDIOVERSION N/A 10/28/2020   Procedure: CARDIOVERSION;  Surgeon: [name], MD;  Location: [facility] ENDOSCOPY;  Service: Cardiovascular;  Laterality: N/A;   COLONOSCOPY N/A 02/01/2016   Procedure: COLONOSCOPY;  Surgeon: Ruffin Frederick, MD;  Location: [facility] ENDOSCOPY;  Service: Gastroenterology;  Laterality: N/A;     A IV Location/Drains/Wounds Patient Lines/Drains/Airways Status     Active Line/Drains/Airways     Name Placement date Placement time Site Days   Peripheral IV 12/22/22 20 G Left Antecubital 12/22/22  --  Antecubital  less than 1            Intake/Output Last 24 hours  Intake/Output Summary (Last 24 hours) at 12/22/2022 2210 Last data filed at 12/22/2022 2103 Gross per 24 hour  Intake 1100 ml  Output --  Net 1100 ml    Labs/Imaging Results for orders placed or performed during the hospital encounter of 12/22/22 (from the past 48 hour(s))  CBC with Differential     Status: Abnormal   Collection Time: 12/22/22  5:44 PM  Result Value Ref Range   WBC 13.7 (H) 4.0 - 10.5 K/uL   RBC 3.42 (L) 3.87 - 5.11 MIL/uL   Hemoglobin 10.0 (L) 12.0 - 15.0 g/dL   HCT 21.3 (L) 08.6 - 57.8 %   MCV 90.6 80.0 - 100.0 fL   MCH 29.2 26.0 -  34.0 pg   MCHC 32.3 30.0 - 36.0 g/dL   RDW 78.2 95.6 - 21.3 %   Platelets 230 150 - 400 K/uL   nRBC 0.0 0.0 - 0.2 %   Neutrophils Relative % 68 %   Neutro Abs 9.3 (H) 1.7 - 7.7 K/uL   Lymphocytes Relative 22 %   Lymphs Abs 3.0 0.7 - 4.0 K/uL   Monocytes Relative 9 %   Monocytes Absolute 1.2 (H) 0.1 - 1.0 K/uL   Eosinophils Relative 0 %   Eosinophils Absolute 0.0 0.0 - 0.5 K/uL   Basophils Relative 0 %   Basophils Absolute 0.0 0.0 - 0.1 K/uL   Immature Granulocytes 1 %   Abs Immature Granulocytes 0.10 (H) 0.00 - 0.07 K/uL    Comment: Performed at [facility], [address], [place], [place] [address]  Comprehensive  metabolic panel     Status: Abnormal   Collection Time: 12/22/22  5:44 PM  Result Value Ref Range   Sodium 140 135 - 145 mmol/L   Potassium 3.9 3.5 - 5.1 mmol/L   Chloride 107 98 - 111 mmol/L   CO2 25 22 - 32 mmol/L   Glucose, Bld 136 (H) 70 - 99 mg/dL    Comment: Glucose reference range applies only to samples taken after fasting for at least 8 hours.   BUN 63 (H) 8 - 23 mg/dL   Creatinine, Ser 8.46 (H) 0.44 - 1.00 mg/dL   Calcium 8.1 (L) 8.9 - 10.3 mg/dL   Total Protein 5.5 (L) 6.5 - 8.1 g/dL   Albumin 3.1 (L) 3.5 - 5.0 g/dL   AST 17 15 - 41 U/L   ALT 19 0 - 44 U/L   Alkaline Phosphatase 48 38 - 126 U/L   Total Bilirubin 0.7 0.3 - 1.2 mg/dL   GFR, Estimated 49 (L) >60 mL/min    Comment: (NOTE) Calculated using the CKD-EPI Creatinine Equation (2021)    Anion gap 8 5 - 15    Comment: Performed at [facility], [address] [place], [place] [address]  Lipase, blood     Status: None   Collection Time: 12/22/22  5:44 PM  Result Value Ref Range   Lipase 45 11 - 51 U/L    Comment: Performed at [facility], [address] [place], [place] [address]  Protime-INR     Status: Abnormal   Collection Time: 12/22/22  5:44 PM  Result Value Ref Range   Prothrombin Time 15.9 (H) 11.4 - 15.2 seconds   INR 1.3 (H) 0.8 - 1.2    Comment: (NOTE) INR goal varies based on device and disease states. Performed at [facility], [address] [place], [place] [address]   Lactic acid, plasma     Status: Abnormal   Collection Time: 12/22/22  7:40 PM  Result Value Ref Range   Lactic Acid, Venous 2.5 (HH) 0.5 - 1.9 mmol/L    Comment: CRITICAL RESULT CALLED TO, READ BACK BY AND VERIFIED WITH Kathleen Lime RN 12/22/22 2121 [place] Performed at [facility], [address] [place], [place] [address]   Type and screen [facility]     Status: None   Collection Time: 12/22/22  7:48 PM  Result Value Ref Range   ABO/RH(D) B POS    Antibody Screen NEG     Sample Expiration      12/25/2022,2359 Performed at [facility], [address], [place], [place] [address]    No results found.  Pending Labs Unresulted Labs (From admission, onward)     Start     Ordered   12/22/22 2056  Blood culture (routine x 2)  BLOOD CULTURE X 2,   R      12/22/22 2055   12/22/22 1856  Lactic acid, plasma  Now then every 2 hours,   R      12/22/22 1855            Vitals/Pain Today's Vitals   12/22/22 1915 12/22/22 1930 12/22/22 2000 12/22/22 2010  BP: (!) 87/74 (!) 75/62 92/60 103/65  Pulse: 77 77 64 81  Resp: (!) 32 18 (!) 26 (!) 27  Temp:      TempSrc:      SpO2: 100% 100% 99% 98%  Weight:      Height:      PainSc:        Isolation Precautions No active isolations  Medications Medications  pantoprazole (PROTONIX) injection 40 mg (40 mg Intravenous Given 12/22/22 2104)  lactated ringers bolus 1,000 mL (0 mLs Intravenous Stopped 12/22/22 1943)  cefTRIAXone (ROCEPHIN) 1 g in sodium chloride 0.9 % 100 mL IVPB (0 g Intravenous Stopped 12/22/22 2103)    Mobility non-ambulatory     Focused Assessments     R Recommendations: See Admitting Provider Note  Report given to:   Additional Notes:

## 2022-12-22 NOTE — Assessment & Plan Note (Addendum)
Systolic BP on lower end- 80s to low 100s however MAPs > 65 IVF hydration, hold antihypertensives and resume as appropriate later. -Hold Imdur, lisinopril, hydralazine and amlodipine. -- LR 75 mL/hr

## 2022-12-22 NOTE — Assessment & Plan Note (Addendum)
Rate controlled. H/o chronic afib. Not on anticoagulation due to GI bleed -Continue amiodarone 100 mg daily M-F -Hold home carvedilol 12.5 mg twice daily

## 2022-12-22 NOTE — H&P (Cosign Needed Addendum)
Hospital Admission History and Physical Service Pager: [phone]  Patient name: Linda [name] Medical record number: [id number] Date of Birth: 06/28/1941 Age: 82 y.o. Gender: female  Primary Care Provider: [name], [name] Consultants: GI Code Status: FULL CODE; consented and amenable to receiving blood products if needed Preferred Emergency Contact:  Contact Information     Name Relation Home Work Mobile          Linda [name],Linda [name] Daughter   [phone]      Chief Complaint: Bloody emesis, bloody stools   Assessment and Plan: Linda [name] is a 82 y.o. female presenting with melena and coffee ground emesis.  Primarily suspect upper GI bleed: differential includes PUD, varices, mallory-weiss tear, gastritis, esophagitis, neoplasm.  Complaint of intermittent chest pain most likely MSK 2/2 vomiting. Will obtain troponin to access for ACS. Hx of thoracic aortic aneurysm (yearly screening-UTD), low threshold for CTA if hemodynamically unstable.   * GI bleed Initially hypotensive, responded well to 1L LR bolus in ED.  Hemodynamically stable.  Transfusion threshold 7, however if hemodynamically unstable would consider threshold of 10.  S/p ceftriaxone in ED, to cover for possible varices.  Started on IV Protonix, GI consulted and recommend EGD in the morning. - Admit to FMTS, progressive, attending Dr. Manson [name] - GI consulted, appreciate [name] - Continue IV Protonix - CTM blood pressure, bolus fluids as necessary - Telemetry - CBC every 4 hours  Intermittent chest pain Not currently in pain.  Worse when coughing and vomiting.  Not reproducible on exam.  Primarily suspect irritation from cough and vomiting.  EKG with A-fib, no STEMI. - Obtain troponins to rule out ACS  Hypertension On multiple blood pressure medications. Hypotensive, secondary to GI bleed.  Normotensive after 1L LR bolus.  Important to optimize blood pressure in setting of aortic aneurysm. -Hold Imdur,  lisinopril, hydralazine and amlodipine.  Atrial fibrillation (HCC) Currently rate controlled with amiodarone, carvedilol and anticoagulated with Xarelto. In setting of bleed, hold Xarelto. Follows closely with Afib clinic, last visit 12/20/2022. - Continue amiodarone 100 mg daily M-F - Continue carvedilol 12.5 mg twice daily  Chronic diastolic CHF (congestive heart failure) (HCC) Last echo in 07/2020 showed LVEF of 60-65%, grade 1 diastolic dysfunction, moderate aortic valve regurgitation.  A-fib clinic on 12/20/2022 ordered echo-consider obtaining during this admission.  Euvolemic on admission exam. Caution with fluid resuscitation.  Held Imdur and lisinopril in setting of hypotension. - Continue carvedilol   FEN/GI: NPO VTE Prophylaxis: SCD  Disposition: Progressive  History of Present Illness:  Linda [name] is a 82 y.o. female presenting with bloody emesis and stool that started today.   States that she started to have pain in her chest and "up here"  (pointing to her abdomen). She also noticed black stools (5-6x). Also had 5-6x of dark emesis. Denies any nausea currently.   Denies any sick symptoms.  Has been feeling weak.  Previously was feeling SOB but "now it's gone".  Feels like her heart is beating fast.  Denies lightheadedness or dizziness.  Denies NSAID use. Denies steroid use.  No abdominal pain now.   Reports she has never had similar symptoms.   In the ED, given 1L LR bolus given hypotensive pressures (80s/50s). Given a dose of CTX to cover for potential variceal bleeds given report of coffee ground emesis. GI consulted, plan to scope in AM.   Review Of Systems: Per HPI with the following additions: +previous chest pain (has improved, not currently hurting), +chest wall  pain with coughing.   Pertinent Past Medical History: Persistent Atrial Fibrillation  HTN CVAx2 Thoracic Aortic Aneurysm  HLD HFpEF Remainder reviewed in history tab.   Pertinent Past Surgical  History: Cardioversion 10/2020 Colonoscopy 01/2016  Per history tab. Patient is unable to recall her surgeries.   Pertinent Social History: Tobacco use: Yes, 1 cigarette daily Alcohol use: No Other Substance use: Denies Lives with daughter and son in law.   Pertinent Family History: Denies family history of cancer, liver disease.   Remainder reviewed in history tab.   Important Outpatient Medications: Xarelto 15 mg daily  Amiodarone 100 mg Mon-Fri Isosorbide mononitrate 60 mg daily  Atorvastatin 80 mg daily  Hydralazine 50 mg TID  Amlodipine 5 mg daily  Lisinopril 10 mg daily  Carvedilol 12.5 mg BID   Remainder reviewed in medication history.   Objective: BP 119/69   Pulse 71   Temp 98.1 F (36.7 C) (Oral)   Resp 14   Ht 5' (1.524 m)   Wt 54.4 kg   SpO2 100%   BMI 23.44 kg/m  Exam: General: NAD, resting comfortably, chronically ill-appearing Eyes: Pale conjunctiva. ENTM: Normocephalic, atraumatic head.  Normal external ear nose.  Dry mucous membrane. Neck: Free range of motion Cardiovascular: Irregular rhythm, no murmurs Respiratory: CTAB, normal work of breathing on room air Gastrointestinal: Soft, not tender, not distended.  Bowel sounds present. Derm: Pale, cool, dry Neuro: Alert and oriented x 4, follows commands, no overt focal neurologic deficits  Labs:  CBC BMET  Recent Labs  Lab 12/22/22 2235  WBC 15.4*  HGB 9.2*  HCT 27.3*  PLT 185   Recent Labs  Lab 12/22/22 1744  NA 140  K 3.9  CL 107  CO2 25  BUN 63*  CREATININE 1.12*  GLUCOSE 136*  CALCIUM 8.1*     EKG: Afib  Imaging Studies Performed:  None  Linda [name] 12/22/2022, 11:23 PM Linda [name]  FPTS Intern pager: [phone], text pages welcome Secure chat group [facility] Teaching Service

## 2022-12-22 NOTE — ED Provider Notes (Signed)
[place] EMERGENCY DEPARTMENT AT [facility] Provider Note   CSN: [id number] Arrival date & time: 12/22/22  1722     History Chief Complaint  Patient presents with   Melena    Pt coming from home with 6-7 episodes of black tarry stools and black emesis. Pt is on blood Xarelto    HPI Linda [name] is a 82 y.o. female presenting for chief complaint of coffee-ground emesis and melena.  82 year old female on Xarelto at baseline for A-fib and heart failure.  Comes in with 5 episodes of melena today as well as 1 episode of coffee-ground emesis.  No history of similar.  Endorses epigastric pain for months but only mild in nature.  Denies fevers chills nausea vomiting syncope or shortness of breath otherwise. No known history of ESLD.  Denies history of varices does not know if she is ever had colonoscopy endoscopy. HX of CHF/Afib CVA on amiodarone and xarelto  Patient's recorded medical, surgical, social, medication list and allergies were reviewed in the Snapshot window as part of the initial history.   Review of Systems   Review of Systems  Constitutional:  Negative for chills and fever.  HENT:  Negative for ear pain and sore throat.   Eyes:  Negative for pain and visual disturbance.  Respiratory:  Negative for cough and shortness of breath.   Cardiovascular:  Negative for chest pain and palpitations.  Gastrointestinal:  Positive for blood in stool and vomiting. Negative for abdominal pain.  Genitourinary:  Negative for dysuria and hematuria.  Musculoskeletal:  Negative for arthralgias and back pain.  Skin:  Negative for color change and rash.  Neurological:  Negative for seizures and syncope.  All other systems reviewed and are negative.   Physical Exam Updated Vital Signs BP 103/65   Pulse 81   Temp 98.1 F (36.7 C) (Oral)   Resp (!) 27   Ht 5' (1.524 m)   Wt 54.4 kg   SpO2 98%   BMI 23.44 kg/m  Physical Exam Vitals and nursing note reviewed.  Constitutional:       General: She is not in acute distress.    Appearance: She is well-developed.  HENT:     Head: Normocephalic and atraumatic.  Eyes:     Conjunctiva/sclera: Conjunctivae normal.  Cardiovascular:     Rate and Rhythm: Normal rate and regular rhythm.     Heart sounds: No murmur heard. Pulmonary:     Effort: Pulmonary effort is normal. No respiratory distress.     Breath sounds: Normal breath sounds.  Abdominal:     General: There is no distension.     Palpations: Abdomen is soft.     Tenderness: There is abdominal tenderness. There is no right CVA tenderness, left CVA tenderness or guarding.  Musculoskeletal:        General: No swelling or tenderness. Normal range of motion.     Cervical back: Neck supple.  Skin:    General: Skin is warm and dry.  Neurological:     General: No focal deficit present.     Mental Status: She is alert and oriented to person, place, and time. Mental status is at baseline.     Cranial Nerves: No cranial nerve deficit.      ED Course/ Medical Decision Making/ A&P Clinical Course as of 12/22/22 2126  Fri Dec 22, 2022  2120 [facility]  [CC]    Clinical Course User Index [CC] Glyn Ade, MD    Procedures .Critical  Care  Performed by: Glyn Ade, MD Authorized by: Glyn Ade, MD   Critical care provider statement:    Critical care time (minutes):  30   Critical care was necessary to treat or prevent imminent or life-threatening deterioration of the following conditions:  Dehydration, shock and circulatory failure   Critical care was time spent personally by me on the following activities:  Development of treatment plan with patient or surrogate, discussions with consultants, evaluation of patient's response to treatment, examination of patient, ordering and review of laboratory studies, ordering and review of radiographic studies, ordering and performing treatments and interventions, pulse oximetry, re-evaluation of patient's  condition and review of old charts   Care discussed with: admitting provider      Medications Ordered in ED Medications  pantoprazole (PROTONIX) injection 40 mg (has no administration in time range)  lactated ringers bolus 1,000 mL (0 mLs Intravenous Stopped 12/22/22 1943)  cefTRIAXone (ROCEPHIN) 1 g in sodium chloride 0.9 % 100 mL IVPB (1 g Intravenous New Bag/Given 12/22/22 2013)    Medical Decision Making:    Linda [name] is a 82 y.o. female who presented to the ED today with melena/coffee-ground emesis detailed above.     Patient's presentation is complicated by their history of multiple comorbid medical problems.  Patient placed on continuous vitals and telemetry monitoring while in ED which was reviewed periodically.   Complete initial physical exam performed, notably the patient  was hemodynamically stable on initial presentation.  Slowly became hypotensive to 90s over 50s and tachycardic to the low 100s.  1 L IV fluid was administered with restoration of normal vital signs.  Exam was performed with a chaperone in the room patient has large volume melena in her rectal vault.  No further episodes of emesis while in the emergency room.      Reviewed and confirmed nursing documentation for past medical history, family history, social history.    Initial Assessment:   With the patient's presentation, most likely diagnosis is upper GI bleed with peptic ulcer disease being the most likely differential given her longstanding history of steroid use, epigastric pain. Other diagnoses were considered including (but not limited to) esophageal varices, AVM, diverticular/lower GI bleed, mesenteric ischemia. These are considered less likely due to history of present illness and physical exam findings.   This is most consistent with an acute life/limb threatening illness complicated by underlying chronic conditions.  Initial Plan:  Consultation with gastroenterology.  Dr. Lavon Paganini responded.  Agreed  with initial empiric therapy with pantoprazole.  No evidence of ESLD per lab review or history Screening labs including CBC and Metabolic panel to evaluate for infectious or metabolic etiology of disease.  Urinalysis with reflex culture ordered to evaluate for UTI or relevant urologic/nephrologic pathology.  CXR to evaluate for structural/infectious intrathoracic pathology.  EKG to evaluate for cardiac pathology. Objective evaluation as below reviewed with plan for close reassessment  Initial Study Results:   Laboratory  All laboratory results reviewed without evidence of clinically relevant pathology.   Exceptions include: Elevated BUN to 60, hemoglobin dropped from 16 in the outpatient setting to 10.  White count of 15  EKG EKG was reviewed independently. Rate, rhythm, axis, intervals all examined and without medically relevant abnormality. ST segments without concerns for elevations.    Radiology  All images reviewed independently. Agree with radiology report at this time.   No results found.   Consults:  Case discussed with gastroenterology who states that they agree with resuscitation overnight  with plan to scope in a.m.  Additionally, consult was placed to unassigned medical admission.  Final Assessment and Plan:   Patient's history present on his physicals and findings in the context of these above findings raise concern for upper GI bleed. Treatment initiated as above.  Will arrange for admission for further diagnostic care and management.   Disposition:   Based on the above findings, I believe this patient is stable for admission.    Patient/family educated about specific findings on our evaluation and explained exact reasons for admission.  Patient/family educated about clinical situation and time was allowed to answer questions.   Admission team communicated with and agreed with need for admission. Patient admitted. Patient ready to move at this time.     Emergency  Department Medication Summary:   Medications  pantoprazole (PROTONIX) injection 40 mg (has no administration in time range)  lactated ringers bolus 1,000 mL (0 mLs Intravenous Stopped 12/22/22 1943)  cefTRIAXone (ROCEPHIN) 1 g in sodium chloride 0.9 % 100 mL IVPB (1 g Intravenous New Bag/Given 12/22/22 2013)         Clinical Impression:  1. Melena      Data Unavailable   Final Clinical Impression(s) / ED Diagnoses Final diagnoses:  Melena    Rx / DC Orders ED Discharge Orders     None         Glyn Ade, MD 12/22/22 2126

## 2022-12-22 NOTE — ED Triage Notes (Signed)
Pt coming from home via EMS with 6-7 episodes of black tarry stools, and black emesis. This has all occurred today. Pt is a&ox4, pale, cool, diaphoretic. Pt denies any other pain or discomfort. Pt is on Xarelto.

## 2022-12-22 NOTE — ED Notes (Signed)
Pt is a&ox4, pale, cool and dry at this time. Pt has been experiencing several episodes of dark stool and vomit today. Intermittent chest pain that started yesterday. Pt does also complain of nausea. Pt is changed into a gown, attached to monitor/vitals. Side rails up x 2, call light within reach.

## 2022-12-22 NOTE — Assessment & Plan Note (Signed)
Work up negative for ACS. Likely secondary to MSK strain from vomiting. Continue to monitor

## 2022-12-22 NOTE — Assessment & Plan Note (Signed)
Last echo in 07/2020 showed LVEF of 60-65%, grade 1 diastolic dysfunction, moderate aortic valve regurgitation.  A-fib clinic on 12/20/2022 ordered echo-consider obtaining during this admission.  Euvolemic on admission exam. Caution with fluid resuscitation.  Held Imdur and lisinopril in setting of hypotension. - Continue carvedilol

## 2022-12-22 NOTE — Hospital Course (Signed)
Linda [name] is a 82 y.o.female with a history of atrial fibrillation on Xarelto, CVA, HFpEF, CAD, HLD, HTN, thoracic aortic aneurysm  who was admitted to the [facility] Teaching Service at [facility] for melena and hematochezia. Her hospital course is detailed below:  Acute blood loss anemia from concern for GI bleed Initially hypotensive, responded well to 1L LR bolus in ED. Hemodynamically stable. S/p ceftriaxone in ED, to cover for possible varices. Started on IV Protonix, GI consulted and performed EGD which showed 1 nonbleeding cratered gastric ulcer with no stigmata of bleeding. Diet was advanced as tolerated. CBC was checked serially, required***U PRBC. Remained hemodynamically stable throughout admission.***  Atrial fibrillation Patient remained rate controlled, on home amiodarone  Hypertension Given soft blood pressure, home carvedilol was held throughout admission.  Patient given IV fluid hydration   Other chronic conditions were medically managed with home medications and formulary alternatives as necessary (***)  PCP Follow-up Recommendations: 1.  Ensure patient resumes anticoagulation (Xarelto) on 5/8 2.  Repeat outpatient EGD recommended in 6 to 8 weeks to ensure appropriate ulcer healing Protonix 40mg  BID x 8 weeks and then transition to 40mg  daily

## 2022-12-23 ENCOUNTER — Observation Stay (HOSPITAL_BASED_OUTPATIENT_CLINIC_OR_DEPARTMENT_OTHER): Payer: 59 | Admitting: Anesthesiology

## 2022-12-23 ENCOUNTER — Observation Stay (HOSPITAL_COMMUNITY): Payer: 59 | Admitting: Anesthesiology

## 2022-12-23 ENCOUNTER — Encounter (HOSPITAL_COMMUNITY)
Admission: EM | Disposition: A | Discharge status: Home Health | Payer: Self-pay | Source: Home / Self Care | Attending: Family Medicine

## 2022-12-23 ENCOUNTER — Encounter (HOSPITAL_COMMUNITY): Payer: Self-pay | Admitting: Student

## 2022-12-23 DIAGNOSIS — D62 Acute posthemorrhagic anemia: Secondary | ICD-10-CM | POA: Diagnosis not present

## 2022-12-23 DIAGNOSIS — I509 Heart failure, unspecified: Secondary | ICD-10-CM

## 2022-12-23 DIAGNOSIS — K922 Gastrointestinal hemorrhage, unspecified: Secondary | ICD-10-CM

## 2022-12-23 DIAGNOSIS — E669 Obesity, unspecified: Secondary | ICD-10-CM | POA: Diagnosis not present

## 2022-12-23 DIAGNOSIS — K921 Melena: Secondary | ICD-10-CM

## 2022-12-23 DIAGNOSIS — T45515A Adverse effect of anticoagulants, initial encounter: Secondary | ICD-10-CM | POA: Diagnosis not present

## 2022-12-23 DIAGNOSIS — I2510 Atherosclerotic heart disease of native coronary artery without angina pectoris: Secondary | ICD-10-CM

## 2022-12-23 DIAGNOSIS — K920 Hematemesis: Secondary | ICD-10-CM | POA: Diagnosis not present

## 2022-12-23 DIAGNOSIS — I712 Thoracic aortic aneurysm, without rupture, unspecified: Secondary | ICD-10-CM | POA: Diagnosis not present

## 2022-12-23 DIAGNOSIS — I4819 Other persistent atrial fibrillation: Secondary | ICD-10-CM | POA: Diagnosis not present

## 2022-12-23 DIAGNOSIS — K8020 Calculus of gallbladder without cholecystitis without obstruction: Secondary | ICD-10-CM | POA: Diagnosis not present

## 2022-12-23 DIAGNOSIS — K259 Gastric ulcer, unspecified as acute or chronic, without hemorrhage or perforation: Secondary | ICD-10-CM | POA: Diagnosis not present

## 2022-12-23 DIAGNOSIS — I5032 Chronic diastolic (congestive) heart failure: Secondary | ICD-10-CM | POA: Diagnosis not present

## 2022-12-23 DIAGNOSIS — Z79899 Other long term (current) drug therapy: Secondary | ICD-10-CM | POA: Diagnosis not present

## 2022-12-23 DIAGNOSIS — Z8673 Personal history of transient ischemic attack (TIA), and cerebral infarction without residual deficits: Secondary | ICD-10-CM | POA: Diagnosis not present

## 2022-12-23 DIAGNOSIS — F17210 Nicotine dependence, cigarettes, uncomplicated: Secondary | ICD-10-CM | POA: Diagnosis not present

## 2022-12-23 DIAGNOSIS — K254 Chronic or unspecified gastric ulcer with hemorrhage: Secondary | ICD-10-CM

## 2022-12-23 DIAGNOSIS — R944 Abnormal results of kidney function studies: Secondary | ICD-10-CM | POA: Diagnosis not present

## 2022-12-23 DIAGNOSIS — R000 Tachycardia, unspecified: Secondary | ICD-10-CM | POA: Diagnosis not present

## 2022-12-23 DIAGNOSIS — E119 Type 2 diabetes mellitus without complications: Secondary | ICD-10-CM | POA: Diagnosis not present

## 2022-12-23 DIAGNOSIS — I4891 Unspecified atrial fibrillation: Secondary | ICD-10-CM

## 2022-12-23 DIAGNOSIS — D6832 Hemorrhagic disorder due to extrinsic circulating anticoagulants: Secondary | ICD-10-CM | POA: Diagnosis not present

## 2022-12-23 DIAGNOSIS — E785 Hyperlipidemia, unspecified: Secondary | ICD-10-CM | POA: Diagnosis not present

## 2022-12-23 DIAGNOSIS — Z87891 Personal history of nicotine dependence: Secondary | ICD-10-CM

## 2022-12-23 DIAGNOSIS — I110 Hypertensive heart disease with heart failure: Secondary | ICD-10-CM | POA: Diagnosis not present

## 2022-12-23 DIAGNOSIS — Z6823 Body mass index (BMI) 23.0-23.9, adult: Secondary | ICD-10-CM | POA: Diagnosis not present

## 2022-12-23 DIAGNOSIS — R079 Chest pain, unspecified: Secondary | ICD-10-CM | POA: Diagnosis not present

## 2022-12-23 DIAGNOSIS — I351 Nonrheumatic aortic (valve) insufficiency: Secondary | ICD-10-CM | POA: Diagnosis not present

## 2022-12-23 DIAGNOSIS — I959 Hypotension, unspecified: Secondary | ICD-10-CM | POA: Diagnosis not present

## 2022-12-23 DIAGNOSIS — R1013 Epigastric pain: Secondary | ICD-10-CM

## 2022-12-23 HISTORY — PX: ESOPHAGOGASTRODUODENOSCOPY (EGD) WITH PROPOFOL: SHX5813

## 2022-12-23 HISTORY — PX: BIOPSY: SHX5522

## 2022-12-23 LAB — CBC
HCT: 28.1 % — ABNORMAL LOW (ref 36.0–46.0)
HCT: 25.9 % — ABNORMAL LOW (ref 36.0–46.0)
HCT: 24.8 % — ABNORMAL LOW (ref 36.0–46.0)
HCT: 26.2 % — ABNORMAL LOW (ref 36.0–46.0)
Hemoglobin: 9.2 g/dL — ABNORMAL LOW (ref 12.0–15.0)
Hemoglobin: 8.7 g/dL — ABNORMAL LOW (ref 12.0–15.0)
Hemoglobin: 8.4 g/dL — ABNORMAL LOW (ref 12.0–15.0)
Hemoglobin: 8.8 g/dL — ABNORMAL LOW (ref 12.0–15.0)
MCH: 30 pg (ref 26.0–34.0)
MCH: 30.2 pg (ref 26.0–34.0)
MCH: 30.5 pg (ref 26.0–34.0)
MCH: 30.6 pg (ref 26.0–34.0)
MCHC: 32.7 g/dL (ref 30.0–36.0)
MCHC: 33.6 g/dL (ref 30.0–36.0)
MCHC: 33.9 g/dL (ref 30.0–36.0)
MCHC: 33.6 g/dL (ref 30.0–36.0)
MCV: 91.5 fL (ref 80.0–100.0)
MCV: 89.9 fL (ref 80.0–100.0)
MCV: 90.2 fL (ref 80.0–100.0)
MCV: 91 fL (ref 80.0–100.0)
Platelets: 202 10*3/uL (ref 150–400)
Platelets: 182 10*3/uL (ref 150–400)
Platelets: 163 10*3/uL (ref 150–400)
Platelets: 178 10*3/uL (ref 150–400)
RBC: 3.07 MIL/uL — ABNORMAL LOW (ref 3.87–5.11)
RBC: 2.88 MIL/uL — ABNORMAL LOW (ref 3.87–5.11)
RBC: 2.75 MIL/uL — ABNORMAL LOW (ref 3.87–5.11)
RBC: 2.88 MIL/uL — ABNORMAL LOW (ref 3.87–5.11)
RDW: 13.2 % (ref 11.5–15.5)
RDW: 13.2 % (ref 11.5–15.5)
RDW: 13.2 % (ref 11.5–15.5)
RDW: 13.1 % (ref 11.5–15.5)
WBC: 16.1 10*3/uL — ABNORMAL HIGH (ref 4.0–10.5)
WBC: 12.7 10*3/uL — ABNORMAL HIGH (ref 4.0–10.5)
WBC: 10.3 10*3/uL (ref 4.0–10.5)
WBC: 9.7 10*3/uL (ref 4.0–10.5)
nRBC: 0 % (ref 0.0–0.2)
nRBC: 0 % (ref 0.0–0.2)
nRBC: 0 % (ref 0.0–0.2)
nRBC: 0 % (ref 0.0–0.2)

## 2022-12-23 LAB — GLUCOSE, CAPILLARY: Glucose-Capillary: 186 mg/dL — ABNORMAL HIGH (ref 70–99)

## 2022-12-23 LAB — CULTURE, BLOOD (ROUTINE X 2): Culture: NO GROWTH

## 2022-12-23 LAB — TROPONIN I (HIGH SENSITIVITY)
Troponin I (High Sensitivity): 4 ng/L (ref <18)
Troponin I (High Sensitivity): 5 ng/L (ref <18)

## 2022-12-23 LAB — BASIC METABOLIC PANEL
Anion gap: 6 (ref 5–15)
BUN: 45 mg/dL — ABNORMAL HIGH (ref 8–23)
CO2: 25 mmol/L (ref 22–32)
Calcium: 7.8 mg/dL — ABNORMAL LOW (ref 8.9–10.3)
Chloride: 108 mmol/L (ref 98–111)
Creatinine, Ser: 1.06 mg/dL — ABNORMAL HIGH (ref 0.44–1.00)
GFR, Estimated: 53 mL/min — ABNORMAL LOW (ref >60)
Glucose, Bld: 139 mg/dL — ABNORMAL HIGH (ref 70–99)
Potassium: 3.9 mmol/L (ref 3.5–5.1)
Sodium: 139 mmol/L (ref 135–145)

## 2022-12-23 LAB — HEMOGLOBIN AND HEMATOCRIT, BLOOD
HCT: 24.9 % — ABNORMAL LOW (ref 36.0–46.0)
Hemoglobin: 8.3 g/dL — ABNORMAL LOW (ref 12.0–15.0)

## 2022-12-23 SURGERY — ESOPHAGOGASTRODUODENOSCOPY (EGD) WITH PROPOFOL
Anesthesia: Monitor Anesthesia Care

## 2022-12-23 MED ORDER — LACTATED RINGERS IV SOLN: INTRAVENOUS | Status: DC

## 2022-12-23 MED ORDER — PROPOFOL 500 MG/50ML IV EMUL
INTRAVENOUS | Status: DC | PRN
  Administered 2022-12-23: 50 ug/kg/min via INTRAVENOUS

## 2022-12-23 MED ORDER — LIDOCAINE 2% (20 MG/ML) 5 ML SYRINGE
INTRAMUSCULAR | Status: DC | PRN
  Administered 2022-12-23: 40 mg via INTRAVENOUS

## 2022-12-23 MED ORDER — PROPOFOL 10 MG/ML IV BOLUS
INTRAVENOUS | Status: DC | PRN
  Administered 2022-12-23: 40 mg via INTRAVENOUS

## 2022-12-23 MED ORDER — EPINEPHRINE 1 MG/10ML IJ SOSY
PREFILLED_SYRINGE | INTRAMUSCULAR | Status: AC
  Filled 2022-12-23: qty 10

## 2022-12-23 SURGICAL SUPPLY — 15 items
BLOCK BITE 60FR ADLT L/F BLUE (MISCELLANEOUS) ×2 IMPLANT
ELECT REM PT RETURN 9FT ADLT (ELECTROSURGICAL)
ELECTRODE REM PT RTRN 9FT ADLT (ELECTROSURGICAL) IMPLANT
FORCEP RJ3 GP 1.8X160 W-NEEDLE (CUTTING FORCEPS) IMPLANT
FORCEPS BIOP RAD 4 LRG CAP 4 (CUTTING FORCEPS) IMPLANT
NDL SCLEROTHERAPY 25GX240 (NEEDLE) IMPLANT
NEEDLE SCLEROTHERAPY 25GX240 (NEEDLE) IMPLANT
PROBE APC STR FIRE (PROBE) IMPLANT
PROBE INJECTION GOLD (MISCELLANEOUS)
PROBE INJECTION GOLD 7FR (MISCELLANEOUS) IMPLANT
SNARE SHORT THROW 13M SML OVAL (MISCELLANEOUS) IMPLANT
SYR 50ML LL SCALE MARK (SYRINGE) IMPLANT
TUBING ENDO SMARTCAP PENTAX (MISCELLANEOUS) ×4 IMPLANT
TUBING IRRIGATION ENDOGATOR (MISCELLANEOUS) ×3 IMPLANT
WATER STERILE IRR 1000ML POUR (IV SOLUTION) IMPLANT

## 2022-12-23 NOTE — Anesthesia Postprocedure Evaluation (Signed)
Anesthesia Post Note  Patient: Linda [name]  Procedure(s) Performed: ESOPHAGOGASTRODUODENOSCOPY (EGD) WITH PROPOFOL BIOPSY     Patient location during evaluation: PACU Anesthesia Type: MAC Level of consciousness: awake and alert Pain management: pain level controlled Vital Signs Assessment: post-procedure vital signs reviewed and stable Respiratory status: spontaneous breathing, nonlabored ventilation, respiratory function stable and patient connected to nasal cannula oxygen Cardiovascular status: stable and blood pressure returned to baseline Postop Assessment: no apparent nausea or vomiting Anesthetic complications: no  No notable events documented.  Last Vitals:  Vitals:   12/23/22 1415 12/23/22 1430  BP: (!) 149/73 130/73  Pulse: 80 65  Resp: (!) 21   Temp:    SpO2: 100% 98%    Last Pain:  Vitals:   12/23/22 1415  TempSrc:   PainSc: 0-No pain                 Shelton Silvas

## 2022-12-23 NOTE — Anesthesia Procedure Notes (Signed)
Procedure Name: MAC Date/Time: 12/23/2022 1:33 PM  Performed by: Zollie Beckers, CRNAPre-anesthesia Checklist: Patient identified, Emergency Drugs available, Suction available and Patient being monitored Oxygen Delivery Method: Nasal cannula Placement Confirmation: positive ETCO2

## 2022-12-23 NOTE — Progress Notes (Signed)
Report given to Amor B., RN.

## 2022-12-23 NOTE — Progress Notes (Signed)
Received patient via carelink from [place] ED. Pt alert and coherent, no complaints at the moment, made comfortable in the bed. Data collected with the help of interpreter. With order to transfer back to [facility]. Information relayed to patient but patient refused to be transferred back to [facility] and wants to stay. Charge nurse and [facility] made aware. Family med night intern on call [place] paged.

## 2022-12-23 NOTE — Op Note (Signed)
[facility] Patient Name: Linda [name] Procedure Date : 12/23/2022 MRN: [id number] Attending MD: Doristine Locks , MD, [id number] Date of Birth: [birth year] CSN: [id number] Age: 82 Admit Type: Inpatient Procedure:                Upper GI endoscopy Indications:              Acute post hemorrhagic anemia, Coffee-ground                            emesis, Melena Providers:                Doristine Locks, MD, Stephens Shire RN, RN, Leanne Lovely, Technician, Gillis Ends Referring MD:              Medicines:                Monitored Anesthesia Care Complications:            No immediate complications. Estimated Blood Loss:     Estimated blood loss was minimal. Procedure:                Pre-Anesthesia Assessment:                           - Prior to the procedure, a History and Physical                            was performed, and patient medications and                            allergies were reviewed. The patient's tolerance of                            previous anesthesia was also reviewed. The risks                            and benefits of the procedure and the sedation                            options and risks were discussed with the patient.                            All questions were answered, and informed consent                            was obtained. Prior Anticoagulants: The patient has                            taken Xarelto (rivaroxaban), last dose was 1 day                            prior to procedure. ASA Grade Assessment: III - A  patient with severe systemic disease. After                            reviewing the risks and benefits, the patient was                            deemed in satisfactory condition to undergo the                            procedure.                           After obtaining informed consent, the endoscope was                            passed under direct vision.  Throughout the                            procedure, the patient's blood pressure, pulse, and                            oxygen saturations were monitored continuously. The                            [id number]-H190 ([id number]) Olympus endoscope was introduced                            through the mouth, and advanced to the second part                            of duodenum. The upper GI endoscopy was                            accomplished without difficulty. The patient                            tolerated the procedure well. Scope In: Scope Out: Findings:      The examined esophagus was normal.      One non-bleeding cratered gastric ulcer with no stigmata of bleeding was       found in the prepyloric region of the stomach. The lesion was 8 mm in       largest dimension. Biopsies were taken with a cold forceps for histology       and Helicobacter pylori testing. Estimated blood loss was minimal.      The cardia, gastric fundus, gastric body and incisura were normal.      The examined duodenum was normal. Impression:               - Normal esophagus.                           - Non-bleeding gastric ulcer with no stigmata of                            bleeding. Biopsied.                           -  Normal cardia, gastric fundus, gastric body and                            incisura.                           - Normal examined duodenum. Recommendation:           - Return patient to hospital ward for ongoing care.                           - Full liquid diet today, and if no evidence of                            rebleeding, can slowly advance as tolerated                            tomorrow.                           - Await pathology results.                           - Use Protonix (pantoprazole) 40 mg IV BID today,                            and if Hgb stable and no evidence of rebleeding,                            can convert to Protonix 40 mg PO BID tomorrow for 8                             weeks. After 8 weeks, reduce to 40 mg daily.                           - Continue serial CBC checks with blood transfusion                            as needed per protocol.                           - Follow-up with her outpatient primary                            Gastroenterologist at [facility] in 2-4                            weeks.                           - Repeat EGD as outpatient in 6-8 weeks to ensure                            appropriate ulcer healing.                           -  Inpatient GI service will sign off at this time.                            Please do not hesitate to contact us with                            additional questions or concerns. Procedure Code(s):        --- Professional ---                           [phone], Esophagogastroduodenoscopy, flexible,                            transoral; with biopsy, single or multiple Diagnosis Code(s):        --- Professional ---                           K25.9, Gastric ulcer, unspecified as acute or                            chronic, without hemorrhage or perforation                           D62, Acute posthemorrhagic anemia                           K92.0, Hematemesis                           K92.1, Melena (includes Hematochezia) CPT copyright 2022 American Medical Association. All rights reserved. The codes documented in this report are preliminary and upon coder review may  be revised to meet current compliance requirements. Doristine Locks, MD 12/23/2022 2:02:09 PM Number of Addenda: 0

## 2022-12-23 NOTE — Progress Notes (Signed)
Daily Progress Note Intern Pager: [phone]  Patient name: Linda [name] Medical record number: [id number] Date of birth: 1941/07/27 Age: 82 y.o. Gender: female  Primary Care Provider: [name], MD Consultants: GI Code Status: FULL code  Pt Overview and Major Events to Date:  5/3: Admitted, GI consulted 5/4: Plan for EGD  Assessment and Plan: Linda [name] is a 82 y.o. female presenting with melena and coffee ground emesis and concern for upper GI bleed (consider varices, PUD, etc).  PMH: HTN, atrial fibrillation, chronic diastolic HF, thoracic aortic aneurysm  * GI bleed Likely upper GI bleed in setting of chronic Xarelto. Hemodynamically stable.  Hgb 10.0 > 9.2 > 8.7 this morning. Monitor closely. No further melanotic episodes. No hematochezia this AM. GI consulted, plan for EGD this morning pending anesthesia availability. - GI consulted, appreciate recommendations -- NPO - Continue IV Protonix -- S/p IV CTX for possible varices, continue if appropriate per GI - Monitor BP closely, MAP goal >65 -- Transfusion threshold Hgb < 8.0, or hemodynamic instability - CBC every 6 hours  Intermittent chest pain Troponins obtained overnight No pain this AM.  Could be MSK related d/t N/V - Cardiac telemetry in lieu of GI bleed, intermittent chest pain  Hypertension Systolic BP on lower end- 80s to low 100s however MAPs > 65 IVF hydration, hold antihypertensives and resume as appropriate later. -Hold Imdur, lisinopril, hydralazine and amlodipine. -- LR 75 mL/hr  Atrial fibrillation (HCC) In atrial fibrillation. Rate controlled, HR in 70s. Continue home medications as below. Hold anticoagulation in setting of concern for GI bleed. - Continue amiodarone 100 mg daily M-F - Continue carvedilol 12.5 mg twice daily  Chronic diastolic CHF (congestive heart failure) (HCC) LVEF in 2021 60-65%, G1DD, moderate aortic regurgitation. Euvolemic, but monitor volume status Gentle  IVF hydration while NPO  - Hold carvedilol for now given softer systolic BP overnight    FEN/GI: NPO PPx: SCD; no pharmacologic VTE ppx due to GI bleed Dispo:Pending PT/OT reccs and clinical improvement  Subjective:  Used iPad [place] interpreter. Patient says she "feels better than yesterday" but still having some abdominal pain. Requested to use the restroom while in the room, said she needed to have a bowel movement. No nausea or vomiting. Denies dizziness.   Objective: Temp:  [97.8 F (36.6 C)-98.1 F (36.7 C)] 97.8 F (36.6 C) (05/04 0732) Pulse Rate:  [64-97] 70 (05/04 0504) Resp:  [14-32] 18 (05/04 0732) BP: (75-119)/(50-74) 102/50 (05/04 0732) SpO2:  [97 %-100 %] 99 % (05/04 0504) Weight:  [53.3 kg-54.4 kg] 53.3 kg (05/04 0504) Physical Exam: General: Awake, alert, oriented pleasant elderly female, [place] speaking Cardiovascular: In atrial fibrillation, rate controlled in the 70s. Respiratory: Normal work of breathing on room air.  Appropriate SpO2.  No wheezing or crackles Abdomen: Soft, nondistended.  Mildly tender to palpation diffusely no rebound or guarding.  Normal active bowel sounds Extremities: Warm, well-perfused.  No peripheral edema.  Laboratory: Most recent CBC Lab Results  Component Value Date   WBC 12.7 (H) 12/23/2022   HGB 8.7 (L) 12/23/2022   HCT 25.9 (L) 12/23/2022   MCV 89.9 12/23/2022   PLT 182 12/23/2022   Most recent BMP    Latest Ref Rng & Units 12/23/2022    5:45 AM  BMP  Glucose 70 - 99 mg/dL 147   BUN 8 - 23 mg/dL 45   Creatinine 8.29 - 1.00 mg/dL 5.62   Sodium 130 - 865 mmol/L 139   Potassium  3.5 - 5.1 mmol/L 3.9   Chloride 98 - 111 mmol/L 108   CO2 22 - 32 mmol/L 25   Calcium 8.9 - 10.3 mg/dL 7.8     Darral Dash, DO 12/23/2022, 9:43 AM  PGY-2, [place] [facility] FPTS Intern pager: [phone], text pages welcome Secure chat group [facility] Teaching Service

## 2022-12-23 NOTE — Evaluation (Signed)
Occupational Therapy Evaluation Patient Details Name: Linda [name] MRN: [id number] DOB: 04/23/1941 Today's Date: 12/23/2022   History of Present Illness Pt is an 82 y/o F presenting to ED on 5/3 with coffee ground emesis, melena, and epigastric pain, PMH includes A fib, CHF, CVA, DM, HLD, HTN   Clinical Impression   Pt reports independence at baseline with ADLs and functional mobility, family assists with IADLs. Evaluation limited to bed level due to pt with "bedrest with bedside commode" orders, reached out to PA and does not want pt ambulating today. Pt ROM WFL for BUE, able to bridge self in bed to remove bedpan. Pt presenting with impairments listed below, will follow acutely. Anticipate pt can return home with HHOT pending progression.     Recommendations for follow up therapy are one component of a multi-disciplinary discharge planning process, led by the attending physician.  Recommendations may be updated based on patient status, additional functional criteria and insurance authorization.   Assistance Recommended at Discharge Intermittent Supervision/Assistance  Patient can return home with the following A little help with walking and/or transfers;A little help with bathing/dressing/bathroom;Assistance with cooking/housework;Direct supervision/assist for medications management;Direct supervision/assist for financial management;Assist for transportation;Help with stairs or ramp for entrance    Functional Status Assessment  Patient has had a recent decline in their functional status and demonstrates the ability to make significant improvements in function in a reasonable and predictable amount of time.  Equipment Recommendations  None recommended by OT (pt has all needed DME)    Recommendations for Other Services PT consult     Precautions / Restrictions Precautions Precautions: Fall Restrictions Weight Bearing Restrictions: No      Mobility Bed Mobility Overal bed mobility:  Needs Assistance             General bed mobility comments: pt able to bridge hips up indepdently for removal of bedpan    Transfers                   General transfer comment: deferred, pt with "bedrest with bedside commode" privileges however per PA, does not want pt ambulating today      Balance Overall balance assessment:  (unable to assess)                                         ADL either performed or assessed with clinical judgement   ADL Overall ADL's : Needs assistance/impaired Eating/Feeding: NPO   Grooming: Set up;Bed level   Upper Body Bathing: Minimal assistance   Lower Body Bathing: Minimal assistance   Upper Body Dressing : Minimal assistance   Lower Body Dressing: Minimal assistance   Toilet Transfer: Minimal assistance   Toileting- Clothing Manipulation and Hygiene: Min guard       Functional mobility during ADLs: Min guard       Vision   Vision Assessment?: No apparent visual deficits     Perception Perception Perception Tested?: No   Praxis Praxis Praxis tested?: Not tested    Pertinent Vitals/Pain       Hand Dominance Right   Extremity/Trunk Assessment Upper Extremity Assessment Upper Extremity Assessment: Generalized weakness   Lower Extremity Assessment Lower Extremity Assessment: Defer to PT evaluation   Cervical / Trunk Assessment Cervical / Trunk Assessment: Other exceptions (unable to assess)   Communication Communication Communication: Interpreter utilized [name] [phone])   Cognition     Overall  Cognitive Status: Difficult to assess; follows commands appropriately via interpreter, provides PLOF                                       General Comments  VSS on RA; interpreter [place] [phone] utilized    Exercises     Shoulder Instructions      Home Living Family/patient expects to be discharged to:: Private residence Living Arrangements: Children Available Help at  Discharge: Family;Available PRN/intermittently Type of Home: House Home Access: Level entry     Home Layout: Two level Alternate Level Stairs-Number of Steps: flight Alternate Level Stairs-Rails: Can reach both Bathroom Shower/Tub: Chief Strategy Officer: Standard     Home Equipment: Agricultural consultant (2 wheels);Shower seat;BSC/3in1          Prior Functioning/Environment Prior Level of Function : Independent/Modified Independent             Mobility Comments: Ind no AD ADLs Comments: Children cook but pt states she is able to shower on her own. Does not perform ADLs if home alone        OT Problem List: Decreased strength;Decreased range of motion;Decreased activity tolerance;Impaired balance (sitting and/or standing)      OT Treatment/Interventions: Self-care/ADL training;Therapeutic exercise;Energy conservation;DME and/or AE instruction;Therapeutic activities;Patient/family education;Balance training    OT Goals(Current goals can be found in the care plan section) Acute Rehab OT Goals Patient Stated Goal: none stated OT Goal Formulation: With patient Time For Goal Achievement: 01/06/23 Potential to Achieve Goals: Good ADL Goals Pt Will Perform Upper Body Dressing: with modified independence;sitting Pt Will Perform Lower Body Dressing: with modified independence;sitting/lateral leans;sit to/from stand Pt Will Transfer to Toilet: with modified independence;ambulating;regular height toilet Pt Will Perform Tub/Shower Transfer: Shower transfer;Tub transfer;with modified independence;ambulating Additional ADL Goal #1: Pt will perform bed mobility independently in prep for ADLs  OT Frequency: Min 1X/week    Co-evaluation PT/OT/SLP Co-Evaluation/Treatment: Yes Reason for Co-Treatment: Complexity of the patient's impairments (multi-system involvement);For patient/therapist safety;To address functional/ADL transfers   OT goals addressed during session:  Strengthening/ROM      AM-PAC OT "6 Clicks" Daily Activity     Outcome Measure Help from another person eating meals?: None (has ROM/ability, but is NPO) Help from another person taking care of personal grooming?: A Little Help from another person toileting, which includes using toliet, bedpan, or urinal?: A Little Help from another person bathing (including washing, rinsing, drying)?: A Little Help from another person to put on and taking off regular upper body clothing?: A Little Help from another person to put on and taking off regular lower body clothing?: A Little 6 Click Score: 19   End of Session Nurse Communication: Mobility status  Activity Tolerance: Patient tolerated treatment well Patient left: in bed;with call bell/phone within reach;with bed alarm set  OT Visit Diagnosis: Unsteadiness on feet (R26.81);Other abnormalities of gait and mobility (R26.89);Muscle weakness (generalized) (M62.81)                Time: 1030-1056 OT Time Calculation (min): 26 min Charges:  OT General Charges $OT Visit: 1 Visit OT Evaluation $OT Eval Moderate Complexity: 1 Mod  Kashawna Manzer K, OTD, OTR/L SecureChat Preferred Acute Rehab [phone]   Artavis Cowie K Koonce 12/23/2022, 12:20 PM

## 2022-12-23 NOTE — H&P (View-Only) (Signed)
Attending physician's note   I have taken a history, reviewed the chart, and examined the patient. I performed a substantive portion of this encounter, including complete performance of at least one of the key components, in conjunction with the APP. I agree with the APP's note, impression, and recommendations with my edits.   82 year old female with a history of A-fib s/p cardioversion 2022 (on Xarelto), CVA x 2, HTN, thoracic aortic aneurysm, HLD, CHF, admitted with melena, coffee-ground emesis, epigastric pain, and acute blood loss anemia.  Did not take her medications (including Xarelto) yesterday since she was not feeling well.  Admission evaluation notable for the following: - H/H 8.7/26 (baseline Hgb ~15) - Obesity 12.7 - BUN/creatinine 63/1.1 --> 45/1.06 (baseline BUN ~14) - Negative/normal troponin, lipase - Lactate 2.5--> 1.5 - INR 1.3 - Normal liver enzymes  1) Melena 2) Coffee-ground emesis 3) Epigastric pain 4) Acute blood loss anemia 5) Symptomatic anemia 82 year old female with high clinical suspicion for acute upper GI bleed in the setting up Xarelto.  Was initially hypotensive on arrival, improved with IV fluids.  Elevated BUN, also consistent with a GI bleed.  Plan for the following: - Expedited EGD today for diagnostic and therapeutic intent - Has been off her Xarelto > 1 day.  Continue holding - Continue high-dose IV PPI - Serial CBC checks with blood products as needed per protocol  6) A-fib 7) History of CVA 8) CHF 9) Chronic anticoagulation - Holding Xarelto - Management per primary Medicine service - Will require MAC assistance for EGD based on age and significant underlying comorbidities  The indications, risks, and benefits of EGD were explained to the patient in detail via interpreter line. Risks include but are not limited to bleeding, perforation, adverse reaction to medications, and cardiopulmonary compromise. Sequelae include but are not  limited to the possibility of surgery, hospitalization, and mortality. The patient verbalized understanding and wished to proceed.    [address] DO, FACG [phone] office         Consultation  Referring Provider: Family medicine service/round Primary Care Physician:  [name], MD Primary Gastroenterologist: [facility] gastroenterology  Reason for Consultation: Melena and coffee-ground emesis/epigastric pain  HPI: Linda [name] is a 82 y.o. female who was admitted last night through the emergency room after she presented with 2-day history initially with onset of epigastric pain followed by nausea and vomiting of blood and coffee-ground material.  She then started passing black stools, and had about 5 episodes yesterday.  She is chronically on Xarelto for atrial fibrillation, says she did not take the medication yesterday because she felt so poorly.  No aspirin or NSAID use.  No prior history of GI bleeding that she is aware of.  She has had prior colonoscopy but is uncertain whether she is ever had an endoscopy. She developed some hypotension in the emergency room and tachycardia.  Has been relatively hemodynamically stable overnight. Patient says she did have a small amount of black stool during the night, no further vomiting, and denies any epigastric pain currently. She has history of atrial fibrillation, congestive heart failure, prior history of CVA, adult onset diabetes mellitus, hyperlipidemia, hypertension.  She did have a colonoscopy here during hospitalization in 2017 which was normal.  This was done because of abnormal imaging. No EGD here or in care everywhere.  She did have CT of her abdomen pelvis in August 2023 that shows layering gallstones, normal-appearing liver, mild pancreatic ductal dilation.  Labs on  admission pro time 15.9/INR 1.3 WBC 15.4/initial hemoglobin 10 which has drifted down to 8.7 this morning MCV 89/platelets 185 Troponin negative x  2 LFTs within normal limits lipase within normal limits Lactate 2.5 BUN 63/creatinine 1.12  Pressure 102/50, pulse in the 70s currently  Interview was done via virtual interpreter/Vietnamese.   Past Medical History:  Diagnosis Date   Atrial fibrillation (HCC)    dx in setting of stroke 4/12;  echo with EF 60-65%, mild LVH, mild AI, grade 1 diast dysfxn   Coronary artery disease    Diabetes mellitus without complication (HCC)    History of CVA (cerebrovascular accident)    New diagnosis December 08, 2010   Hx of cardiovascular stress test    a. Myoview 8/12:  EF 69%, no ischemia.    Hyperlipidemia    Hypertension    Impaired glucose tolerance 05/08/2011   Stroke (HCC) 2012   no deficits   Tobacco abuse    Ongoing (3-6 cigarettes per day, 25 pack year history)    Past Surgical History:  Procedure Laterality Date   CARDIOVERSION N/A 10/28/2020   Procedure: CARDIOVERSION;  Surgeon: [name], MD;  Location: [facility] ENDOSCOPY;  Service: Cardiovascular;  Laterality: N/A;   COLONOSCOPY N/A 02/01/2016   Procedure: COLONOSCOPY;  Surgeon: Ruffin Frederick, MD;  Location: [facility] ENDOSCOPY;  Service: Gastroenterology;  Laterality: N/A;    Prior to Admission medications   Medication Sig Start Date End Date Taking? Authorizing Provider  amiodarone (PACERONE) 200 MG tablet Take 0.5 tablets (100 mg total) by mouth daily. MONDAY - FRIDAY AND NONE ON SATURDAY AND SUNDAY 09/12/22   Graciella Freer, PA-C  amLODipine (NORVASC) 5 MG tablet Take 1 tablet (5 mg total) by mouth daily. 06/13/22   Lanier Prude, MD  atorvastatin (LIPITOR) 80 MG tablet Take 1 tablet (80 mg total) by mouth daily. 06/13/22   Lanier Prude, MD  carvedilol (COREG) 12.5 MG tablet Take 12.5 mg by mouth 2 (two) times daily with a meal.    [provider]  hydrALAZINE (APRESOLINE) 50 MG tablet Take 1 tablet (50 mg total) by mouth 3 (three) times daily. 06/13/22   Lanier Prude, MD   isosorbide mononitrate (IMDUR) 60 MG 24 hr tablet Take 1 tablet (60 mg total) by mouth daily. 06/13/22   Lanier Prude, MD  lisinopril (ZESTRIL) 10 MG tablet Take 1 tablet (10 mg total) by mouth daily. 08/11/22   Sharlene Dory, PA-C  Rivaroxaban (XARELTO) 15 MG TABS tablet Take 1 tablet (15 mg total) by mouth daily with supper. 06/13/22   Lanier Prude, MD  traMADol (ULTRAM) 50 MG tablet Take 50 mg by mouth every 12 (twelve) hours as needed for severe pain. 08/07/22   [provider]    Current Facility-Administered Medications  Medication Dose Route Frequency Provider Last Rate Last Admin   [START ON 12/25/2022] amiodarone (PACERONE) tablet 100 mg  100 mg Oral Daily Tiffany Kocher, DO       lactated ringers infusion   Intravenous Continuous Westley Chandler, MD       ondansetron [facility]) injection 4 mg  4 mg Intravenous Q6H PRN Tiffany Kocher, DO       pantoprazole (PROTONIX) injection 40 mg  40 mg Intravenous BID Glyn Ade, MD   40 mg at 12/22/22 2104    Allergies as of 12/22/2022   (No Known Allergies)    Family History  Problem Relation Age of Onset   Coronary artery  disease Neg Hx     Social History   Socioeconomic History   Marital status: Widowed    Spouse name: Not on file   Number of children: Not on file   Years of education: Not on file   Highest education level: Not on file  Occupational History    Employer: [employer]   Occupation: Not working  Tobacco Use   Smoking status: Former    Packs/day: 0.25    Years: 25.00    Additional pack years: 0.00    Total pack years: 6.25    Types: Cigarettes    Quit date: 02/15/2022    Years since quitting: 0.8   Smokeless tobacco: Never   Tobacco comments:    Former smoker 07/18/22  Substance and Sexual Activity   Alcohol use: No   Drug use: No   Sexual activity: Not on file  Other Topics Concern   Not on file  Social History Narrative   Lives in [place] with her daughters   Regular  diet   No regular exercise but active and independent with all daily activities   Social Determinants of Health   Financial Resource Strain: Not on file  Food Insecurity: No Food Insecurity (12/23/2022)   Hunger Vital Sign    Worried About Running Out of Food in the Last Year: Never true    Ran Out of Food in the Last Year: Never true  Transportation Needs: No Transportation Needs (12/23/2022)   PRAPARE - Administrator, Civil Service (Medical): No    Lack of Transportation (Non-Medical): No  Physical Activity: Not on file  Stress: Not on file  Social Connections: Not on file  Intimate Partner Violence: Not At Risk (12/23/2022)   Humiliation, Afraid, Rape, and Kick questionnaire    Fear of Current or Ex-Partner: No    Emotionally Abused: No    Physically Abused: No    Sexually Abused: No    Review of Systems: Pertinent positive and negative review of systems were noted in the above HPI section.  All other review of systems was otherwise negative.   Physical Exam: Vital signs in last 24 hours: Temp:  [97.8 F (36.6 C)-98.1 F (36.7 C)] 97.8 F (36.6 C) (05/04 0732) Pulse Rate:  [64-97] 70 (05/04 0504) Resp:  [14-32] 18 (05/04 0732) BP: (75-119)/(50-74) 102/50 (05/04 0732) SpO2:  [97 %-100 %] 99 % (05/04 0504) Weight:  [53.3 kg-54.4 kg] 53.3 kg (05/04 0504) Last BM Date : 12/22/22 General:   Alert,  Well-developed, well-nourished, elderly Asian female pleasant and cooperative in NAD Head:  Normocephalic and atraumatic. Eyes:  Sclera clear, no icterus.   Conjunctiva pale Ears:  Normal auditory acuity. Nose:  No deformity, discharge,  or lesions. Mouth:  No deformity or lesions.   Neck:  Supple; no masses or thyromegaly. Lungs:  Clear throughout to auscultation.   No wheezes, crackles, or rhonchi . Heart:  Regular rate and rhythm; no murmurs, clicks, rubs,  or gallops. Abdomen:  Soft,nontender, BS active,nonpalp mass or hsm.   Rectal: Not done- Msk:  Symmetrical  without gross deformities. . Pulses:  Normal pulses noted. Extremities:  Without clubbing or edema. Neurologic:  Alert and  oriented x4;  grossly normal neurologically. Skin:  Intact without significant lesions or rashes.. Psych:  Alert and cooperative. Normal mood and affect.  Intake/Output from previous day: 05/03 0701 - 05/04 0700 In: 1100 [IV Piggyback:1100] Out: -  Intake/Output this shift: No intake/output data recorded.  Lab Results: Recent  Labs    12/22/22 2235 12/23/22 0211 12/23/22 0545  WBC 15.4* 16.1* 12.7*  HGB 9.2* 9.2* 8.7*  HCT 27.3* 28.1* 25.9*  PLT 185 202 182   BMET Recent Labs    12/22/22 1744 12/23/22 0545  NA 140 139  K 3.9 3.9  CL 107 108  CO2 25 25  GLUCOSE 136* 139*  BUN 63* 45*  CREATININE 1.12* 1.06*  CALCIUM 8.1* 7.8*   LFT Recent Labs    12/22/22 1744  PROT 5.5*  ALBUMIN 3.1*  AST 17  ALT 19  ALKPHOS 48  BILITOT 0.7   PT/INR Recent Labs    12/22/22 1744  LABPROT 15.9*  INR 1.3*   Hepatitis Panel No results for input(s): "HEPBSAG", "HCVAB", "HEPAIGM", "HEPBIGM" in the last 72 hours.    IMPRESSION:  #22  82 year old non-English-speaking [place] female admitted through the emergency room last evening after onset 2 days ago of epigastric pain followed by coffee-ground emesis and melena with several episodes of melena yesterday.  This is in the setting of chronic Xarelto which the patient held yesterday  No prior history of GI bleeding epigastric pain has resolved Hemoglobin 10 on arrival down to 8.7 at last check   Etiology of acute epigastric pain not clear, patient does have previously documented gallstones, query biliary colic versus pain secondary to peptic ulcer disease gastropathy etc.  Etiology of upper GI bleeding, rule out esophagitis, Mallory-Weiss tear, peptic ulcer disease, neoplasm  #2 acute normocytic anemia secondary to acute GI blood loss  #3 chronic anticoagulation-on Xarelto last dose 12/21/2022 #4  history of atrial fibrillation #5   Prior history of CVA #6.  History of congestive heart failure #7.  History of hypertension # 8 negative colonoscopy 2017   PLAN: Keep n.p.o. Patient will be scheduled for EGD with Dr. Barron Alvine today pending anesthesia availability.  Procedure was discussed in detail with the patient including indications risk and benefits and she is agreeable to proceed. IV PPI twice daily Serial hemoglobins every 6 hours and transfuse to keep hemoglobin close to 8 Hold Xarelto Further recommendations pending findings at EGD-consider upper abdominal ultrasound   Amy Esterwood PA-C 12/23/2022, 9:13 AM

## 2022-12-23 NOTE — Progress Notes (Signed)
[facility] called for patient transfer 

## 2022-12-23 NOTE — Progress Notes (Signed)
Spoke directly with patient, nurse and interpreter by phone. Apologized for the mistake in transferring patient to [facility]. Discussed the importance of transfer to [facility] while under our care to receive EGD she needs. Patient expressed understanding, and agreed to transfer back to [facility].

## 2022-12-23 NOTE — Evaluation (Signed)
Physical Therapy Evaluation Patient Details Name: Linda [name] MRN: [id number] DOB: 1941/03/19 Today's Date: 12/23/2022  History of Present Illness  Pt is an 82 y/o F presenting to ED on 5/3 with coffee ground emesis, melena, and epigastric pain, PMH includes A fib, CHF, CVA, DM, HLD, HTN  Clinical Impression  Pt presents with admitting diagnosis above. Co eval with OT. Evaluation today limited due to "bedrest with bedside commode" order being placed by PA just prior to session. OT contacted PA who stated that they did not want patient ambulating today. Pt is scheduled for EGD today. PT will follow up on Monday for OOB assessment where DC recs will be updated.     Recommendations for follow up therapy are one component of a multi-disciplinary discharge planning process, led by the attending physician.  Recommendations may be updated based on patient status, additional functional criteria and insurance authorization.  Follow Up Recommendations       Assistance Recommended at Discharge Intermittent Supervision/Assistance  Patient can return home with the following  Other (comment) (Pending OOB assessment)    Equipment Recommendations Other (comment) (Pending OOB asssessment)  Recommendations for Other Services       Functional Status Assessment Patient has had a recent decline in their functional status and demonstrates the ability to make significant improvements in function in a reasonable and predictable amount of time.     Precautions / Restrictions Precautions Precautions: Fall Restrictions Weight Bearing Restrictions: No      Mobility  Bed Mobility Overal bed mobility: Needs Assistance             General bed mobility comments: pt able to bridge hips up indepdently for removal of bedpan    Transfers                   General transfer comment: deferred, pt with "bedrest with bedside commode" privileges however per PA, does not want pt ambulating today     Ambulation/Gait                  Stairs            Wheelchair Mobility    Modified Rankin (Stroke Patients Only)       Balance Overall balance assessment:  (unable to assess)                                           Pertinent Vitals/Pain Pain Assessment Pain Assessment: No/denies pain    Home Living Family/patient expects to be discharged to:: Private residence Living Arrangements: Children Available Help at Discharge: Family;Available PRN/intermittently Type of Home: House Home Access: Level entry     Alternate Level Stairs-Number of Steps: flight Home Layout: Two level Home Equipment: Agricultural consultant (2 wheels);Shower seat;BSC/3in1      Prior Function Prior Level of Function : Independent/Modified Independent             Mobility Comments: Ind no AD ADLs Comments: Children cook but pt states she is able to shower on her own. Does not perform ADLs if home alone     Hand Dominance   Dominant Hand: Right    Extremity/Trunk Assessment   Upper Extremity Assessment Upper Extremity Assessment: Defer to OT evaluation    Lower Extremity Assessment Lower Extremity Assessment: Generalized weakness    Cervical / Trunk Assessment Cervical / Trunk Assessment: Other exceptions (unable to  assess)  Communication   Communication: Interpreter utilized Linda [name] [phone])  Cognition Arousal/Alertness: Awake/alert Behavior During Therapy: WFL for tasks assessed/performed Overall Cognitive Status: Difficult to assess                                          General Comments General comments (skin integrity, edema, etc.): VSS on RA    Exercises     Assessment/Plan    PT Assessment Patient needs continued PT services  PT Problem List Decreased strength;Decreased activity tolerance;Decreased range of motion       PT Treatment Interventions DME instruction;Gait training;Stair training;Functional mobility  training;Therapeutic activities;Therapeutic exercise;Balance training;Neuromuscular re-education;Patient/family education    PT Goals (Current goals can be found in the Care Plan section)  Acute Rehab PT Goals Patient Stated Goal: to go home PT Goal Formulation: With patient Time For Goal Achievement: 01/06/23 Potential to Achieve Goals: Fair    Frequency Min 1X/week     Co-evaluation PT/OT/SLP Co-Evaluation/Treatment: Yes Reason for Co-Treatment: Complexity of the patient's impairments (multi-system involvement);For patient/therapist safety;To address functional/ADL transfers PT goals addressed during session: Strengthening/ROM OT goals addressed during session: Strengthening/ROM       AM-PAC PT "6 Clicks" Mobility  Outcome Measure Help needed turning from your back to your side while in a flat bed without using bedrails?: A Little Help needed moving from lying on your back to sitting on the side of a flat bed without using bedrails?: A Little Help needed moving to and from a bed to a chair (including a wheelchair)?: A Little Help needed standing up from a chair using your arms (e.g., wheelchair or bedside chair)?: A Lot Help needed to walk in hospital room?: A Lot Help needed climbing 3-5 steps with a railing? : A Lot 6 Click Score: 15    End of Session   Activity Tolerance: Patient tolerated treatment well Patient left: in bed;with call bell/phone within reach;with bed alarm set Nurse Communication: Mobility status PT Visit Diagnosis: Other abnormalities of gait and mobility (R26.89)    Time: 0454-0981 PT Time Calculation (min) (ACUTE ONLY): 24 min   Charges:   PT Evaluation $PT Eval Moderate Complexity: 1 Mod PT Treatments $Therapeutic Activity: 8-22 mins        Linda [name], PT, DPT Acute Rehab Services [phone]   Linda [name] 12/23/2022, 1:05 PM

## 2022-12-23 NOTE — Anesthesia Preprocedure Evaluation (Addendum)
Anesthesia Evaluation  Patient identified by MRN, date of birth, ID band Patient awake    Reviewed: Allergy & Precautions, NPO status , Patient's Chart, lab work & pertinent test results  Airway Mallampati: III  TM Distance: >3 FB Neck ROM: Full    Dental  (+) Edentulous Upper, Partial Lower, Dental Advisory Given   Pulmonary former smoker   breath sounds clear to auscultation       Cardiovascular hypertension, Pt. on medications and Pt. on home beta blockers + CAD and +CHF   Rhythm:Irregular Rate:Normal  Echo: 1. Left ventricular ejection fraction, by estimation, is 60 to 65%. The  left ventricle has normal function. The left ventricle has no regional  wall motion abnormalities. There is mild left ventricular hypertrophy.  Left ventricular diastolic parameters  are consistent with Grade I diastolic dysfunction (impaired relaxation).   2. Right ventricular systolic function is normal. The right ventricular  size is normal.   3. The mitral valve is normal in structure. Trivial mitral valve  regurgitation. No evidence of mitral stenosis.   4. The aortic valve is tricuspid. Aortic valve regurgitation is moderate.  No aortic stenosis is present. Aortic regurgitation PHT measures 566 msec.   5. Aortic dilatation noted. There is mild dilatation of the ascending  aorta, measuring 39 mm.   6. The inferior vena cava is normal in size with greater than 50%  respiratory variability, suggesting right atrial pressure of 3 mmHg.      Neuro/Psych CVA  negative psych ROS   GI/Hepatic negative GI ROS, Neg liver ROS,,,  Endo/Other  diabetes    Renal/GU negative Renal ROS     Musculoskeletal negative musculoskeletal ROS (+)    Abdominal   Peds  Hematology negative hematology ROS (+)   Anesthesia Other Findings   Reproductive/Obstetrics                             Anesthesia Physical Anesthesia  Plan  ASA: 3 and emergent  Anesthesia Plan: MAC   Post-op Pain Management: Minimal or no pain anticipated   Induction: Intravenous  PONV Risk Score and Plan: 0 and Propofol infusion  Airway Management Planned: Natural Airway  Additional Equipment: None  Intra-op Plan:   Post-operative Plan:   Informed Consent: I have reviewed the patients History and Physical, chart, labs and discussed the procedure including the risks, benefits and alternatives for the proposed anesthesia with the patient or authorized representative who has indicated his/her understanding and acceptance.     Interpreter used for [employer] Discussed with: CRNA  Anesthesia Plan Comments: (Lab Results      Component                Value               Date                      WBC                      10.3                12/23/2022                HGB                      8.4 (L)  12/23/2022                HCT                      24.8 (L)            12/23/2022                MCV                      90.2                12/23/2022                PLT                      163                 12/23/2022           )       Anesthesia Quick Evaluation

## 2022-12-23 NOTE — Consult Note (Addendum)
    Attending physician's note   I have taken a history, reviewed the chart, and examined the patient. I performed a substantive portion of this encounter, including complete performance of at least one of the key components, in conjunction with the APP. I agree with the APP's note, impression, and recommendations with my edits.   81-year-old female with a history of A-fib s/p cardioversion 2022 (on Xarelto), CVA x 2, HTN, thoracic aortic aneurysm, HLD, CHF, admitted with melena, coffee-ground emesis, epigastric pain, and acute blood loss anemia.  Did not take her medications (including Xarelto) yesterday since she was not feeling well.  Admission evaluation notable for the following: - H/H 8.7/26 (baseline Hgb ~15) - Obesity 12.7 - BUN/creatinine 63/1.1 --> 45/1.06 (baseline BUN ~14) - Negative/normal troponin, lipase - Lactate 2.5--> 1.5 - INR 1.3 - Normal liver enzymes  1) Melena 2) Coffee-ground emesis 3) Epigastric pain 4) Acute blood loss anemia 5) Symptomatic anemia 81-year-old female with high clinical suspicion for acute upper GI bleed in the setting up Xarelto.  Was initially hypotensive on arrival, improved with IV fluids.  Elevated BUN, also consistent with a GI bleed.  Plan for the following: - Expedited EGD today for diagnostic and therapeutic intent - Has been off her Xarelto > 1 day.  Continue holding - Continue high-dose IV PPI - Serial CBC checks with blood products as needed per protocol  6) A-fib 7) History of CVA 8) CHF 9) Chronic anticoagulation - Holding Xarelto - Management per primary Medicine service - Will require MAC assistance for EGD based on age and significant underlying comorbidities  The indications, risks, and benefits of EGD were explained to the patient in detail via interpreter line. Risks include but are not limited to bleeding, perforation, adverse reaction to medications, and cardiopulmonary compromise. Sequelae include but are not  limited to the possibility of surgery, hospitalization, and mortality. The patient verbalized understanding and wished to proceed.    Jayme Cham, DO, FACG [phone] office         Consultation  Referring Provider: Family medicine service/round Primary Care Physician:  Newlin, Enobong, MD Primary Gastroenterologist: [facility]  Reason for Consultation: Melena and coffee-ground emesis/epigastric pain  HPI: Linda [name] is a 81 y.o. female who was admitted last night through the emergency room after she presented with 2-day history initially with onset of epigastric pain followed by nausea and vomiting of blood and coffee-ground material.  She then started passing black stools, and had about 5 episodes yesterday.  She is chronically on Xarelto for atrial fibrillation, says she did not take the medication yesterday because she felt so poorly.  No aspirin or NSAID use.  No prior history of GI bleeding that she is aware of.  She has had prior colonoscopy but is uncertain whether she is ever had an endoscopy. She developed some hypotension in the emergency room and tachycardia.  Has been relatively hemodynamically stable overnight. Patient says she did have a small amount of black stool during the night, no further vomiting, and denies any epigastric pain currently. She has history of atrial fibrillation, congestive heart failure, prior history of CVA, adult onset diabetes mellitus, hyperlipidemia, hypertension.  She did have a colonoscopy here during hospitalization in 2017 which was normal.  This was done because of abnormal imaging. No EGD here or in care everywhere.  She did have CT of her abdomen pelvis in August 2023 that shows layering gallstones, normal-appearing liver, mild pancreatic ductal dilation.  Labs on   admission pro time 15.9/INR 1.3 WBC 15.4/initial hemoglobin 10 which has drifted down to 8.7 this morning MCV 89/platelets 185 Troponin negative x  2 LFTs within normal limits lipase within normal limits Lactate 2.5 BUN 63/creatinine 1.12  Pressure 102/50, pulse in the 70s currently  Interview was done via virtual interpreter/Vietnamese.   Past Medical History:  Diagnosis Date   Atrial fibrillation (HCC)    dx in setting of stroke 4/12;  echo with EF 60-65%, mild LVH, mild AI, grade 1 diast dysfxn   Coronary artery disease    Diabetes mellitus without complication (HCC)    History of CVA (cerebrovascular accident)    New diagnosis December 08, 2010   Hx of cardiovascular stress test    a. Myoview 8/12:  EF 69%, no ischemia.    Hyperlipidemia    Hypertension    Impaired glucose tolerance 05/08/2011   Stroke (HCC) 2012   no deficits   Tobacco abuse    Ongoing (3-6 cigarettes per day, 25 pack year history)    Past Surgical History:  Procedure Laterality Date   CARDIOVERSION N/A 10/28/2020   Procedure: CARDIOVERSION;  Surgeon: Schumann, Christopher L, MD;  Location: [facility];  Service: Cardiovascular;  Laterality: N/A;   COLONOSCOPY N/A 02/01/2016   Procedure: COLONOSCOPY;  Surgeon: Steven Paul Armbruster, MD;  Location: [facility];  Service: Gastroenterology;  Laterality: N/A;    Prior to Admission medications   Medication Sig Start Date End Date Taking? Authorizing Provider  amiodarone (PACERONE) 200 MG tablet Take 0.5 tablets (100 mg total) by mouth daily. MONDAY - FRIDAY AND NONE ON SATURDAY AND SUNDAY 09/12/22   Tillery, Michael Andrew, PA-C  amLODipine (NORVASC) 5 MG tablet Take 1 tablet (5 mg total) by mouth daily. 06/13/22   Lambert, Cameron T, MD  atorvastatin (LIPITOR) 80 MG tablet Take 1 tablet (80 mg total) by mouth daily. 06/13/22   Lambert, Cameron T, MD  carvedilol (COREG) 12.5 MG tablet Take 12.5 mg by mouth 2 (two) times daily with a meal.    [provider]  hydrALAZINE (APRESOLINE) 50 MG tablet Take 1 tablet (50 mg total) by mouth 3 (three) times daily. 06/13/22   Lambert, Cameron T, MD   isosorbide mononitrate (IMDUR) 60 MG 24 hr tablet Take 1 tablet (60 mg total) by mouth daily. 06/13/22   Lambert, Cameron T, MD  lisinopril (ZESTRIL) 10 MG tablet Take 1 tablet (10 mg total) by mouth daily. 08/11/22   Conte, Tessa N, PA-C  Rivaroxaban (XARELTO) 15 MG TABS tablet Take 1 tablet (15 mg total) by mouth daily with supper. 06/13/22   Lambert, Cameron T, MD  traMADol (ULTRAM) 50 MG tablet Take 50 mg by mouth every 12 (twelve) hours as needed for severe pain. 08/07/22   [provider]    Current Facility-Administered Medications  Medication Dose Route Frequency Provider Last Rate Last Admin   [START ON 12/25/2022] amiodarone (PACERONE) tablet 100 mg  100 mg Oral Daily Bronson, Martin, DO       lactated ringers infusion   Intravenous Continuous Brown, Carina M, MD       ondansetron (ZOFRAN) injection 4 mg  4 mg Intravenous Q6H PRN Bronson, Martin, DO       pantoprazole (PROTONIX) injection 40 mg  40 mg Intravenous BID Countryman, Chase, MD   40 mg at 12/22/22 2104    Allergies as of 12/22/2022   (No Known Allergies)    Family History  Problem Relation Age of Onset   Coronary artery   disease Neg Hx     Social History   Socioeconomic History   Marital status: Widowed    Spouse name: Not on file   Number of children: Not on file   Years of education: Not on file   Highest education level: Not on file  Occupational History    Employer: [employer]   Occupation: Not working  Tobacco Use   Smoking status: Former    Packs/day: 0.25    Years: 25.00    Additional pack years: 0.00    Total pack years: 6.25    Types: Cigarettes    Quit date: 02/15/2022    Years since quitting: 0.8   Smokeless tobacco: Never   Tobacco comments:    Former smoker 07/18/22  Substance and Sexual Activity   Alcohol use: No   Drug use: No   Sexual activity: Not on file  Other Topics Concern   Not on file  Social History Narrative   Lives in [place] with her daughters   Regular  diet   No regular exercise but active and independent with all daily activities   Social Determinants of Health   Financial Resource Strain: Not on file  Food Insecurity: No Food Insecurity (12/23/2022)   Hunger Vital Sign    Worried About Running Out of Food in the Last Year: Never true    Ran Out of Food in the Last Year: Never true  Transportation Needs: No Transportation Needs (12/23/2022)   PRAPARE - Transportation    Lack of Transportation (Medical): No    Lack of Transportation (Non-Medical): No  Physical Activity: Not on file  Stress: Not on file  Social Connections: Not on file  Intimate Partner Violence: Not At Risk (12/23/2022)   Humiliation, Afraid, Rape, and Kick questionnaire    Fear of Current or Ex-Partner: No    Emotionally Abused: No    Physically Abused: No    Sexually Abused: No    Review of Systems: Pertinent positive and negative review of systems were noted in the above HPI section.  All other review of systems was otherwise negative.   Physical Exam: Vital signs in last 24 hours: Temp:  [97.8 F (36.6 C)-98.1 F (36.7 C)] 97.8 F (36.6 C) (05/04 0732) Pulse Rate:  [64-97] 70 (05/04 0504) Resp:  [14-32] 18 (05/04 0732) BP: (75-119)/(50-74) 102/50 (05/04 0732) SpO2:  [97 %-100 %] 99 % (05/04 0504) Weight:  [53.3 kg-54.4 kg] 53.3 kg (05/04 0504) Last BM Date : 12/22/22 General:   Alert,  Well-developed, well-nourished, elderly Asian female pleasant and cooperative in NAD Head:  Normocephalic and atraumatic. Eyes:  Sclera clear, no icterus.   Conjunctiva pale Ears:  Normal auditory acuity. Nose:  No deformity, discharge,  or lesions. Mouth:  No deformity or lesions.   Neck:  Supple; no masses or thyromegaly. Lungs:  Clear throughout to auscultation.   No wheezes, crackles, or rhonchi . Heart:  Regular rate and rhythm; no murmurs, clicks, rubs,  or gallops. Abdomen:  Soft,nontender, BS active,nonpalp mass or hsm.   Rectal: Not done- Msk:  Symmetrical  without gross deformities. . Pulses:  Normal pulses noted. Extremities:  Without clubbing or edema. Neurologic:  Alert and  oriented x4;  grossly normal neurologically. Skin:  Intact without significant lesions or rashes.. Psych:  Alert and cooperative. Normal mood and affect.  Intake/Output from previous day: 05/03 0701 - 05/04 0700 In: 1100 [IV Piggyback:1100] Out: -  Intake/Output this shift: No intake/output data recorded.  Lab Results: Recent   Labs    12/22/22 2235 12/23/22 0211 12/23/22 0545  WBC 15.4* 16.1* 12.7*  HGB 9.2* 9.2* 8.7*  HCT 27.3* 28.1* 25.9*  PLT 185 202 182   BMET Recent Labs    12/22/22 1744 12/23/22 0545  NA 140 139  K 3.9 3.9  CL 107 108  CO2 25 25  GLUCOSE 136* 139*  BUN 63* 45*  CREATININE 1.12* 1.06*  CALCIUM 8.1* 7.8*   LFT Recent Labs    12/22/22 1744  PROT 5.5*  ALBUMIN 3.1*  AST 17  ALT 19  ALKPHOS 48  BILITOT 0.7   PT/INR Recent Labs    12/22/22 1744  LABPROT 15.9*  INR 1.3*   Hepatitis Panel No results for input(s): "HEPBSAG", "HCVAB", "HEPAIGM", "HEPBIGM" in the last 72 hours.    IMPRESSION:  #1  81-year-old non-English-speaking Vietnamese female admitted through the emergency room last evening after onset 2 days ago of epigastric pain followed by coffee-ground emesis and melena with several episodes of melena yesterday.  This is in the setting of chronic Xarelto which the patient held yesterday  No prior history of GI bleeding epigastric pain has resolved Hemoglobin 10 on arrival down to 8.7 at last check   Etiology of acute epigastric pain not clear, patient does have previously documented gallstones, query biliary colic versus pain secondary to peptic ulcer disease gastropathy etc.  Etiology of upper GI bleeding, rule out esophagitis, Mallory-Weiss tear, peptic ulcer disease, neoplasm  #2 acute normocytic anemia secondary to acute GI blood loss  #3 chronic anticoagulation-on Xarelto last dose 12/21/2022 #4  history of atrial fibrillation #5   Prior history of CVA #6.  History of congestive heart failure #7.  History of hypertension # 8 negative colonoscopy 2017   PLAN: Keep n.p.o. Patient will be scheduled for EGD with Dr. Kile Kabler today pending anesthesia availability.  Procedure was discussed in detail with the patient including indications risk and benefits and she is agreeable to proceed. IV PPI twice daily Serial hemoglobins every 6 hours and transfuse to keep hemoglobin close to 8 Hold Xarelto Further recommendations pending findings at EGD-consider upper abdominal ultrasound   Amy Esterwood PA-C 12/23/2022, 9:13 AM    

## 2022-12-23 NOTE — Transfer of Care (Signed)
Immediate Anesthesia Transfer of Care Note  Patient: Linda [name]  Procedure(s) Performed: ESOPHAGOGASTRODUODENOSCOPY (EGD) WITH PROPOFOL BIOPSY  Patient Location: PACU and Endoscopy Unit  Anesthesia Type:MAC  Level of Consciousness: awake  Airway & Oxygen Therapy: Patient Spontanous Breathing  Post-op Assessment: Report given to RN and Post -op Vital signs reviewed and stable  Post vital signs: Reviewed and stable  Last Vitals:  Vitals Value Taken Time  BP 112/69 12/23/22 1355  Temp 36.2 C 12/23/22 1355  Pulse 83 12/23/22 1357  Resp 18 12/23/22 1357  SpO2 98 % 12/23/22 1357  Vitals shown include unvalidated device data.  Last Pain:  Vitals:   12/23/22 1355  TempSrc: Tympanic  PainSc: 0-No pain         Complications: No notable events documented.

## 2022-12-23 NOTE — Interval H&P Note (Signed)
History and Physical Interval Note:  12/23/2022 1:33 PM  Linda [name]  has presented today for surgery, with the diagnosis of GI bleed.  The various methods of treatment have been discussed with the patient and family. After consideration of risks, benefits and other options for treatment, the patient has consented to  Procedure(s): ESOPHAGOGASTRODUODENOSCOPY (EGD) WITH PROPOFOL (N/A) as a surgical intervention.  The patient's history has been reviewed, patient examined, no change in status, stable for surgery.  I have reviewed the patient's chart and labs.  Questions were answered to the patient's satisfaction.     Verlin Dike Briaunna Grindstaff

## 2022-12-23 NOTE — Progress Notes (Signed)
Received report from [facility] ED nurse.

## 2022-12-24 DIAGNOSIS — Z79899 Other long term (current) drug therapy: Secondary | ICD-10-CM | POA: Diagnosis not present

## 2022-12-24 DIAGNOSIS — R079 Chest pain, unspecified: Secondary | ICD-10-CM | POA: Diagnosis not present

## 2022-12-24 DIAGNOSIS — I2510 Atherosclerotic heart disease of native coronary artery without angina pectoris: Secondary | ICD-10-CM | POA: Diagnosis present

## 2022-12-24 DIAGNOSIS — E119 Type 2 diabetes mellitus without complications: Secondary | ICD-10-CM | POA: Diagnosis present

## 2022-12-24 DIAGNOSIS — E785 Hyperlipidemia, unspecified: Secondary | ICD-10-CM | POA: Diagnosis present

## 2022-12-24 DIAGNOSIS — F17210 Nicotine dependence, cigarettes, uncomplicated: Secondary | ICD-10-CM | POA: Diagnosis present

## 2022-12-24 DIAGNOSIS — Z6823 Body mass index (BMI) 23.0-23.9, adult: Secondary | ICD-10-CM | POA: Diagnosis not present

## 2022-12-24 DIAGNOSIS — I959 Hypotension, unspecified: Secondary | ICD-10-CM | POA: Diagnosis not present

## 2022-12-24 DIAGNOSIS — K922 Gastrointestinal hemorrhage, unspecified: Secondary | ICD-10-CM | POA: Diagnosis not present

## 2022-12-24 DIAGNOSIS — I5032 Chronic diastolic (congestive) heart failure: Secondary | ICD-10-CM | POA: Diagnosis present

## 2022-12-24 DIAGNOSIS — T45515A Adverse effect of anticoagulants, initial encounter: Secondary | ICD-10-CM | POA: Diagnosis present

## 2022-12-24 DIAGNOSIS — D62 Acute posthemorrhagic anemia: Secondary | ICD-10-CM | POA: Diagnosis present

## 2022-12-24 DIAGNOSIS — K8020 Calculus of gallbladder without cholecystitis without obstruction: Secondary | ICD-10-CM | POA: Diagnosis present

## 2022-12-24 DIAGNOSIS — R000 Tachycardia, unspecified: Secondary | ICD-10-CM | POA: Diagnosis not present

## 2022-12-24 DIAGNOSIS — K921 Melena: Secondary | ICD-10-CM | POA: Diagnosis present

## 2022-12-24 DIAGNOSIS — K254 Chronic or unspecified gastric ulcer with hemorrhage: Secondary | ICD-10-CM

## 2022-12-24 DIAGNOSIS — D6832 Hemorrhagic disorder due to extrinsic circulating anticoagulants: Secondary | ICD-10-CM | POA: Diagnosis present

## 2022-12-24 DIAGNOSIS — I110 Hypertensive heart disease with heart failure: Secondary | ICD-10-CM | POA: Diagnosis present

## 2022-12-24 DIAGNOSIS — I4819 Other persistent atrial fibrillation: Secondary | ICD-10-CM | POA: Diagnosis present

## 2022-12-24 DIAGNOSIS — R944 Abnormal results of kidney function studies: Secondary | ICD-10-CM | POA: Diagnosis present

## 2022-12-24 DIAGNOSIS — I712 Thoracic aortic aneurysm, without rupture, unspecified: Secondary | ICD-10-CM | POA: Diagnosis present

## 2022-12-24 DIAGNOSIS — I351 Nonrheumatic aortic (valve) insufficiency: Secondary | ICD-10-CM | POA: Diagnosis present

## 2022-12-24 DIAGNOSIS — Z8673 Personal history of transient ischemic attack (TIA), and cerebral infarction without residual deficits: Secondary | ICD-10-CM | POA: Diagnosis not present

## 2022-12-24 DIAGNOSIS — E669 Obesity, unspecified: Secondary | ICD-10-CM | POA: Diagnosis present

## 2022-12-24 LAB — TYPE AND SCREEN: Antibody Screen: NEGATIVE

## 2022-12-24 LAB — BASIC METABOLIC PANEL
Anion gap: 7 (ref 5–15)
BUN: 11 mg/dL (ref 8–23)
CO2: 24 mmol/L (ref 22–32)
Calcium: 8.7 mg/dL — ABNORMAL LOW (ref 8.9–10.3)
Chloride: 106 mmol/L (ref 98–111)
Creatinine, Ser: 0.79 mg/dL (ref 0.44–1.00)
GFR, Estimated: >60 mL/min (ref >60)
Glucose, Bld: 102 mg/dL — ABNORMAL HIGH (ref 70–99)
Potassium: 3.6 mmol/L (ref 3.5–5.1)
Sodium: 137 mmol/L (ref 135–145)

## 2022-12-24 LAB — GLUCOSE, CAPILLARY
Glucose-Capillary: 104 mg/dL — ABNORMAL HIGH (ref 70–99)
Glucose-Capillary: 79 mg/dL (ref 70–99)

## 2022-12-24 LAB — HEMOGLOBIN AND HEMATOCRIT, BLOOD
HCT: 23.1 % — ABNORMAL LOW (ref 36.0–46.0)
HCT: 31.9 % — ABNORMAL LOW (ref 36.0–46.0)
HCT: 34.7 % — ABNORMAL LOW (ref 36.0–46.0)
Hemoglobin: 7.8 g/dL — ABNORMAL LOW (ref 12.0–15.0)
Hemoglobin: 10.4 g/dL — ABNORMAL LOW (ref 12.0–15.0)
Hemoglobin: 11.9 g/dL — ABNORMAL LOW (ref 12.0–15.0)

## 2022-12-24 LAB — PREPARE RBC (CROSSMATCH)

## 2022-12-24 LAB — CULTURE, BLOOD (ROUTINE X 2)

## 2022-12-24 LAB — BPAM RBC: Unit Type and Rh: 7300

## 2022-12-24 MED ORDER — SODIUM CHLORIDE 0.9% IV SOLUTION: Freq: Once | INTRAVENOUS | Status: AC

## 2022-12-24 NOTE — Progress Notes (Addendum)
     Daily Progress Note Intern Pager: [phone]  Patient name: Linda [name] Medical record number: [id number] Date of birth: [birth year] Age: 82 y.o. Gender: female  Primary Care Provider: [name], MD Consultants: GI Code Status: FULL code   Pt Overview and Major Events to Date:  5/3: Admitted, GI consulted 5/4: Plan for EGD   Assessment and Plan: RHIONNA GIFFIN is a 82 y.o. female presenting with melena and coffee ground emesis and concern for upper GI bleed (consider varices, PUD, etc).   PMH: HTN, atrial fibrillation, chronic diastolic HF, thoracic aortic aneurysm  * GI bleed EGD showed large non-bleeding gastric ulcer. No varices noted. Hgb 7.8 this AM, received 1u pRBC. Post transfusion H&H stable at 10.4. If concern for continued bleed or hemodynamic instability, will reconsult GI. -GI signed off -IV Protonix 40mg  BID -Transfusion threshold Hgb < 8.0, or hemodynamic instability -H&H q6h  Intermittent chest pain Work up negative for ACS. Likely secondary to MSK strain from vomiting. Continue to monitor  Hypertension BP stable this AM. Advancing diet, will reduce fluids. Restart home meds as able. -Hold Imdur, lisinopril, hydralazine and amlodipine. -Decrease to LR 50 mL/hr  Atrial fibrillation (HCC) Rate controlled. H/o chronic afib. Not on anticoagulation due to GI bleed -Continue amiodarone 100 mg daily M-F -Hold home carvedilol 12.5 mg twice daily  Chronic diastolic CHF (congestive heart failure) (HCC) Appears euvolemic upon exam.   FEN/GI: Liquid diet PPx: SCD; no pharmacologic VTE ppx due to GI bleed Dispo: HH PT/OT pending clinical improvement  Subjective:  Patient assessed at bedside using [place] interpretor. States she has been able to tolerate some PO. No further episodes of vomiting or melena. Reports she has some chest pain but it is in the contact of vomiting previously  Objective: Temp:  [97.1 F (36.2 C)-98.6 F (37 C)] 97.7 F (36.5  C) (05/05 0812) Pulse Rate:  [46-84] 46 (05/05 0812) Resp:  [15-21] 19 (05/05 0812) BP: (110-149)/(51-103) 142/75 (05/05 0812) SpO2:  [97 %-100 %] 99 % (05/05 1478) Physical Exam: General: Elderly female, resting in bed comfortably. Cardiovascular: RRR without murmur Respiratory: CTAB. Normal WOB on RA Abdomen: Soft, non-tender, non-distended Extremities: No peripheral edema  Laboratory: Most recent CBC Lab Results  Component Value Date   WBC 9.7 12/23/2022   HGB 10.4 (L) 12/24/2022   HCT 31.9 (L) 12/24/2022   MCV 91.0 12/23/2022   PLT 178 12/23/2022   Most recent BMP    Latest Ref Rng & Units 12/23/2022    5:45 AM  BMP  Glucose 70 - 99 mg/dL 295   BUN 8 - 23 mg/dL 45   Creatinine 6.21 - 1.00 mg/dL 3.08   Sodium 657 - 846 mmol/L 139   Potassium 3.5 - 5.1 mmol/L 3.9   Chloride 98 - 111 mmol/L 108   CO2 22 - 32 mmol/L 25   Calcium 8.9 - 10.3 mg/dL 7.8     Other pertinent labs: Glu: 104   Elberta Fortis, MD 12/24/2022, 11:13 AM  PGY-1, [facility] FPTS Intern pager: [phone], text pages welcome Secure chat group [facility] Teaching Service

## 2022-12-25 ENCOUNTER — Other Ambulatory Visit (HOSPITAL_COMMUNITY): Payer: Self-pay

## 2022-12-25 ENCOUNTER — Encounter (HOSPITAL_COMMUNITY): Payer: Self-pay | Admitting: Gastroenterology

## 2022-12-25 ENCOUNTER — Telehealth (HOSPITAL_COMMUNITY): Payer: Self-pay | Admitting: Pharmacy Technician

## 2022-12-25 DIAGNOSIS — K922 Gastrointestinal hemorrhage, unspecified: Secondary | ICD-10-CM | POA: Diagnosis not present

## 2022-12-25 LAB — TYPE AND SCREEN
ABO/RH(D): B POS
Unit division: 0

## 2022-12-25 LAB — GLUCOSE, CAPILLARY
Glucose-Capillary: 84 mg/dL (ref 70–99)
Glucose-Capillary: 102 mg/dL — ABNORMAL HIGH (ref 70–99)

## 2022-12-25 LAB — BPAM RBC
Blood Product Expiration Date: 202405212359
ISSUE DATE / TIME: 202405050506

## 2022-12-25 LAB — HEMOGLOBIN AND HEMATOCRIT, BLOOD
HCT: 34.1 % — ABNORMAL LOW (ref 36.0–46.0)
Hemoglobin: 11.7 g/dL — ABNORMAL LOW (ref 12.0–15.0)

## 2022-12-25 LAB — CULTURE, BLOOD (ROUTINE X 2): Culture: NO GROWTH

## 2022-12-25 MED ORDER — PANTOPRAZOLE SODIUM 40 MG PO TBEC
40.0000 mg | DELAYED_RELEASE_TABLET | Freq: Two times a day (BID) | ORAL | 1 refills | Status: AC
  Filled 2022-12-25: qty 60, 30d supply, fill #0

## 2022-12-25 MED ORDER — PANTOPRAZOLE SODIUM 40 MG PO TBEC: 40.0000 mg | DELAYED_RELEASE_TABLET | Freq: Two times a day (BID) | ORAL | Status: DC

## 2022-12-25 MED ORDER — PANTOPRAZOLE SODIUM 40 MG IV SOLR: 40.0000 mg | Freq: Two times a day (BID) | INTRAVENOUS | Status: DC

## 2022-12-25 MED ORDER — PANTOPRAZOLE SODIUM 40 MG PO TBEC
40.0000 mg | DELAYED_RELEASE_TABLET | Freq: Two times a day (BID) | ORAL | Status: DC
  Administered 2022-12-25: 40 mg via ORAL
  Filled 2022-12-25: qty 1

## 2022-12-25 MED ORDER — LIDOCAINE 5 % EX PTCH: 1.0000 | MEDICATED_PATCH | CUTANEOUS | Status: DC

## 2022-12-25 MED ORDER — ONDANSETRON HCL 4 MG PO TABS: 4.0000 mg | ORAL_TABLET | Freq: Four times a day (QID) | ORAL | Status: DC | PRN

## 2022-12-25 NOTE — Progress Notes (Signed)
Occupational Therapy Treatment Patient Details Name: Linda [name] MRN: [id number] DOB: 05-07-1941 Today's Date: 12/25/2022   History of present illness Pt is an 82 y/o F presenting to ED on 5/3 with coffee ground emesis, melena, and epigastric pain, PMH includes A fib, CHF, CVA, DM, HLD, HTN   OT comments  Pt progressing towards goals this session, completing ADLs with supervision, pt mod I for bed mobility,and supervision for transfers with RW. HR up to 140bpm with short distance ambulation to bathroom. Pt presenting with impairments listed below, will follow acutely. Updating d/c recommendation, anticipate no OT follow up needs at d/c.   Recommendations for follow up therapy are one component of a multi-disciplinary discharge planning process, led by the attending physician.  Recommendations may be updated based on patient status, additional functional criteria and insurance authorization.    Assistance Recommended at Discharge Intermittent Supervision/Assistance  Patient can return home with the following  A little help with walking and/or transfers;A little help with bathing/dressing/bathroom;Assistance with cooking/housework;Direct supervision/assist for medications management;Direct supervision/assist for financial management;Assist for transportation;Help with stairs or ramp for entrance   Equipment Recommendations  None recommended by OT    Recommendations for Other Services PT consult    Precautions / Restrictions Precautions Precautions: Fall Restrictions Weight Bearing Restrictions: No       Mobility Bed Mobility Overal bed mobility: Modified Independent                  Transfers Overall transfer level: Needs assistance Equipment used: None Transfers: Sit to/from Stand Sit to Stand: Supervision                 Balance Overall balance assessment: Mild deficits observed, not formally tested                                         ADL  either performed or assessed with clinical judgement   ADL Overall ADL's : Needs assistance/impaired     Grooming: Standing;Supervision/safety               Lower Body Dressing: Supervision/safety   Toilet Transfer: Supervision/safety;Ambulation;Regular Social worker and Hygiene: Supervision/safety       Functional mobility during ADLs: Supervision/safety      Extremity/Trunk Assessment Upper Extremity Assessment Upper Extremity Assessment: Defer to OT evaluation   Lower Extremity Assessment Lower Extremity Assessment: Generalized weakness        Vision   Vision Assessment?: No apparent visual deficits   Perception Perception Perception: Not tested   Praxis Praxis Praxis: Not tested    Cognition Arousal/Alertness: Awake/alert Behavior During Therapy: WFL for tasks assessed/performed Overall Cognitive Status: Difficult to assess                                 General Comments: pt reports feeling like she is at her baseline        Exercises      Shoulder Instructions       General Comments HR up to 140bpm with short distance ambulation to bathroom, interpreter utilized My Danella Penton [phone]    Pertinent Vitals/ Pain       Pain Assessment Pain Assessment: Faces Pain Score: 3  Faces Pain Scale: Hurts a little bit Pain Location: chest, reports pressure Pain Descriptors / Indicators: Discomfort Pain Intervention(s): Limited activity  within patient's tolerance, Monitored during session, Repositioned (RN present at end of session to give pain meds)  Home Living                                          Prior Functioning/Environment              Frequency  Min 1X/week        Progress Toward Goals  OT Goals(current goals can now be found in the care plan section)  Progress towards OT goals: Progressing toward goals  Acute Rehab OT Goals Patient Stated Goal: none stated OT Goal  Formulation: With patient Time For Goal Achievement: 01/06/23 Potential to Achieve Goals: Good ADL Goals Pt Will Perform Upper Body Dressing: with modified independence;sitting Pt Will Perform Lower Body Dressing: with modified independence;sitting/lateral leans;sit to/from stand Pt Will Transfer to Toilet: with modified independence;ambulating;regular height toilet Pt Will Perform Tub/Shower Transfer: Shower transfer;Tub transfer;with modified independence;ambulating Additional ADL Goal #1: Pt will perform bed mobility independently in prep for ADLs  Plan Frequency remains appropriate;Discharge plan needs to be updated    Co-evaluation                 AM-PAC OT "6 Clicks" Daily Activity     Outcome Measure   Help from another person eating meals?: None Help from another person taking care of personal grooming?: A Little Help from another person toileting, which includes using toliet, bedpan, or urinal?: A Little Help from another person bathing (including washing, rinsing, drying)?: A Little Help from another person to put on and taking off regular upper body clothing?: A Little Help from another person to put on and taking off regular lower body clothing?: A Little 6 Click Score: 19    End of Session    OT Visit Diagnosis: Unsteadiness on feet (R26.81);Other abnormalities of gait and mobility (R26.89);Muscle weakness (generalized) (M62.81)   Activity Tolerance Patient tolerated treatment well   Patient Left in bed;with call bell/phone within reach;with bed alarm set;with nursing/sitter in room   Nurse Communication Mobility status        Time: 4098-1191 OT Time Calculation (min): 17 min  Charges: OT General Charges $OT Visit: 1 Visit OT Treatments $Self Care/Home Management : 8-22 mins  Carver Fila, OTD, OTR/L SecureChat Preferred Acute Rehab [phone]   Carver Fila Koonce 12/25/2022, 12:07 PM

## 2022-12-25 NOTE — Discharge Summary (Signed)
Family Medicine Teaching [facility] Discharge Summary  Patient name: Linda [name] Medical record number: [id number] Date of birth: September 08, 1940 Age: 82 y.o. Gender: female Date of Admission: 12/22/2022  Date of Discharge: 12/25/2022 Admitting Physician: Tiffany Kocher, DO  Primary Care Provider: [name], MD Consultants: GI  Indication for Hospitalization: Gastric ulcer  Discharge Diagnoses/Problem List:  Principal Problem for Admission: Gastric ulcer Other Problems addressed during stay:  Principal Problem:   GI bleed Active Problems:   Intermittent chest pain   Hypertension   Atrial fibrillation (HCC)   Chronic diastolic CHF (congestive heart failure) (HCC)   Melena   Acute blood loss anemia   Abdominal pain, epigastric   Coffee ground emesis   Gastric ulcer without hemorrhage or perforation   Gastric ulcer with hemorrhage  Brief Hospital Course:  Linda [name] is a 82 y.o.female with a history of atrial fibrillation on Xarelto, CVA, HFpEF, CAD, HLD, HTN, thoracic aortic aneurysm  who was admitted to the [facility] Family Medicine Teaching Service at [facility] for melena and hematochezia. Her hospital course is detailed below:  Acute blood loss anemia from concern for GI bleed Initially hypotensive with episodes of coffee ground emesis and melena reported. Hemodynamically stable and BP improved with fluids. Received ceftriaxone to cover for possible varices. Started on IV Protonix, GI consulted and performed EGD which showed 1 nonbleeding cratered gastric ulcer with no stigmata of bleeding. Diet was advanced as tolerated. CBC was checked serially, required 1U PRBC. Hgb stable at 11.7 without further episodes of melena or emesis at the time of discharge.  Atrial fibrillation Patient remained rate controlled, on home amiodarone  Hypertension Given soft blood pressure, home carvedilol was held throughout admission.  Patient given IV fluid hydration, patient normotensive at  the time of discharge.  Other chronic conditions were medically managed with home medications and formulary alternatives as necessary (HF)  PCP Follow-up Recommendations: 1. Transition patient to Eliquis 2.5mg  BID at outpatient follow up due to lower bleeding risk than Xarelto 2. Repeat outpatient EGD recommended in 6 to 8 weeks to ensure appropriate ulcer healing 3. Protonix 40mg  BID x 8 weeks and then transition to 40mg  daily 4. Follow up with GI about de-escalating protonix   Disposition: Home  Discharge Condition: Stable  Discharge Exam:  Vitals:   12/25/22 0406 12/25/22 0747  BP: 102/62 131/81  Pulse: 63 75  Resp: 20 16  Temp: 97.8 F (36.6 C) 97.9 F (36.6 C)  SpO2: 99% 98%   General: Elderly female, resting in bed comfortably. Cardiovascular: RRR without murmur Respiratory: CTAB. Normal WOB on RA Abdomen: Soft, non-tender, non-distended Extremities: No peripheral edema  Significant Procedures: EGD  Significant Labs and Imaging:  Recent Labs  Lab 12/23/22 1446 12/23/22 2046 12/24/22 0944 12/24/22 1459 12/25/22 0738  WBC 9.7  --   --   --   --   HGB 8.8*   < > 10.4* 11.9* 11.7*  HCT 26.2*   < > 31.9* 34.7* 34.1*  PLT 178  --   --   --   --    < > = values in this interval not displayed.   Recent Labs  Lab 12/24/22 1459  NA 137  K 3.6  CL 106  CO2 24  GLUCOSE 102*  BUN 11  CREATININE 0.79  CALCIUM 8.7*    Pertinent Imaging: None  Results/Tests Pending at Time of Discharge: None  Discharge Medications:  Allergies as of 12/25/2022   No Known Allergies  Medication List     STOP taking these medications    amLODipine 5 MG tablet Commonly known as: NORVASC   hydrALAZINE 50 MG tablet Commonly known as: APRESOLINE   lisinopril 10 MG tablet Commonly known as: ZESTRIL   Rivaroxaban 15 MG Tabs tablet Commonly known as: Xarelto       TAKE these medications    amiodarone 200 MG tablet Commonly known as: PACERONE Take 0.5 tablets  (100 mg total) by mouth daily. MONDAY - FRIDAY AND NONE ON SATURDAY AND SUNDAY   atorvastatin 80 MG tablet Commonly known as: LIPITOR Take 1 tablet (80 mg total) by mouth daily.   carvedilol 12.5 MG tablet Commonly known as: COREG Take 12.5 mg by mouth 2 (two) times daily with a meal.   isosorbide mononitrate 60 MG 24 hr tablet Commonly known as: IMDUR Take 1 tablet (60 mg total) by mouth daily.   pantoprazole 40 MG tablet Commonly known as: Protonix Take 1 tablet (40 mg total) by mouth 2 (two) times daily.   traMADol 50 MG tablet Commonly known as: ULTRAM Take 50 mg by mouth every 12 (twelve) hours as needed for severe pain.        Discharge Instructions: Please refer to Patient Instructions section of EMR for full details.  Patient was counseled important signs and symptoms that should prompt return to medical care, changes in medications, dietary instructions, activity restrictions, and follow up appointments.   Follow-Up Appointments:  Follow-up Information     [name], MD. Schedule an appointment as soon as possible for a visit in 2 day(s).   Specialty: Family Medicine Contact information: [address] [place] [address] [place] [address] [phone]         Shellia Cleverly, DO. Schedule an appointment as soon as possible for a visit.   Specialty: Gastroenterology Contact information: [address] [place] [address] [phone]         [facility] Follow up.   Why: Physical and Occupational Therapy- Office to call with visit times Contact information: [address] [place] [address] [phone]                 Elberta Fortis, MD 12/25/2022, 12:01 PM PGY-1, [facility] Family Medicine

## 2022-12-25 NOTE — Plan of Care (Signed)
  Problem: Education: Goal: Knowledge of General Education information will improve Description: Including pain rating scale, medication(s)/side effects and non-pharmacologic comfort measures Outcome: Adequate for Discharge   Problem: Health Behavior/Discharge Planning: Goal: Ability to manage health-related needs will improve Outcome: Adequate for Discharge   Problem: Clinical Measurements: Goal: Ability to maintain clinical measurements within normal limits will improve Outcome: Adequate for Discharge Goal: Will remain free from infection Outcome: Adequate for Discharge Goal: Diagnostic test results will improve Outcome: Adequate for Discharge Goal: Respiratory complications will improve Outcome: Adequate for Discharge Goal: Cardiovascular complication will be avoided Outcome: Adequate for Discharge   Problem: Activity: Goal: Risk for activity intolerance will decrease Outcome: Adequate for Discharge   Problem: Nutrition: Goal: Adequate nutrition will be maintained Outcome: Adequate for Discharge   Problem: Coping: Goal: Level of anxiety will decrease Outcome: Adequate for Discharge   Problem: Elimination: Goal: Will not experience complications related to bowel motility Outcome: Adequate for Discharge Goal: Will not experience complications related to urinary retention Outcome: Adequate for Discharge   Problem: Pain Managment: Goal: General experience of comfort will improve Outcome: Adequate for Discharge   Problem: Skin Integrity: Goal: Risk for impaired skin integrity will decrease Outcome: Adequate for Discharge   

## 2022-12-25 NOTE — Discharge Instructions (Addendum)
Dear Linda [name],   Thank you so much for allowing [name] to be part of your care!  You were admitted to [facility] for concern of GI bleed   Please follow up with your PCP in the next few days (either 5/7-5/8)  We will need to restart a blood thinner at that time when you see your PCP. We would like to switch you to Eliquis due to less bleeding risk.  Please follow up with the GI doctors but continue with the Protonix twice a day until seeing them!   POST-HOSPITAL & CARE INSTRUCTIONS Follow up with PCP in a couple of days  Follow up with the GI doctors as well Please let PCP/Specialists know of any changes that were made.  Please see medications section of this packet for any medication changes.   DOCTOR'S APPOINTMENT & FOLLOW UP CARE INSTRUCTIONS  Future Appointments  Date Time Provider Department Center  05/04/2023  8:00 AM Jake Bathe, MD [facility]    RETURN PRECAUTIONS: Worsening shortness of breath, chest pain, bleeding, confusion, vomiting blood.   Take care and be well!  Family Medicine Teaching Service  [place]  [facility]  [address] [place], [place] [address] [phone]  STOP Xarelto. Begin NEW Eliquis as directed.  Information on my medicine - ELIQUIS (apixaban)  Why was Eliquis prescribed for you? Eliquis was prescribed for you to reduce the risk of a blood clot forming that can cause a stroke if you have a medical condition called atrial fibrillation (a type of irregular heartbeat).  What do You need to know about Eliquis ? Take your Eliquis TWICE DAILY - one tablet in the morning and one tablet in the evening with or without food. If you have difficulty swallowing the tablet whole please discuss with your pharmacist how to take the medication safely.  Take Eliquis exactly as prescribed by your doctor and DO NOT stop taking Eliquis without talking to the doctor who prescribed the medication.  Stopping  may increase your risk of developing a stroke.  Refill your prescription before you run out.  After discharge, you should have regular check-up appointments with your healthcare provider that is prescribing your Eliquis.  In the future your dose may need to be changed if your kidney function or weight changes by a significant amount or as you get older.  What do you do if you miss a dose? If you miss a dose, take it as soon as you remember on the same day and resume taking twice daily.  Do not take more than one dose of ELIQUIS at the same time to make up a missed dose.  Important Safety Information A possible side effect of Eliquis is bleeding. You should call your healthcare provider right away if you experience any of the following: Bleeding from an injury or your nose that does not stop. Unusual colored urine (red or dark brown) or unusual colored stools (red or black). Unusual bruising for unknown reasons. A serious fall or if you hit your head (even if there is no bleeding).  Some medicines may interact with Eliquis and might increase your risk of bleeding or clotting while on Eliquis. To help avoid this, consult your healthcare provider or pharmacist prior to using any new prescription or non-prescription medications, including herbals, vitamins, non-steroidal anti-inflammatory drugs (NSAIDs) and supplements.  This website has more information on Eliquis (apixaban): http://www.eliquis.com/eliquis/home

## 2022-12-25 NOTE — TOC Initial Note (Signed)
Transition of Care [facility]) - Initial/Assessment Note    Patient Details  Name: Linda [name] MRN: [id number] Date of Birth: 12-14-1940  Transition [facility] CM/SW Contact:    Gala Lewandowsky, RN Phone Number: 12/25/2022, 1:39 PM  Clinical Narrative:  Patient presented for hematemesis and melena. PTA patient was from home with support of children that work during the day.Patient reports that she has DME cane, rolling walker and wheelchair. Case Manager spoke with patient via video interpreter and she states she is agreeable to home health services PT/OT. Patient does not have an agency preference- [facility] is willing to accept the patient for services. Start of care to begin within 24-48 hours post transition home. No further needs identified at this time.                  Expected Discharge Plan: Home w Home Health Services Barriers to Discharge: No Barriers Identified   Patient Goals and CMS Choice Patient states their goals for this hospitalization and ongoing recovery are:: to return home. CMS Medicare.gov Compare Post Acute Care list provided to:: Patient Choice offered to / list presented to : Patient (No agency preference.)      Expected Discharge Plan and Services In-house Referral: NA Discharge Planning Services: CM Consult   Living arrangements for the past 2 months: Single Family Home Expected Discharge Date: 12/25/22                 DME Agency: NA       HH Arranged: PT, OT HH Agency: [facility] Date HH Agency Contacted: 12/25/22 Time HH Agency Contacted: 1130 Representative spoke with at [facility] Agency: Amy  Prior Living Arrangements/Services Living arrangements for the past 2 months: Single Family Home Lives with:: Adult Children Patient language and need for interpreter reviewed:: Yes (video interpreter.) Do you feel safe going back to the place where you live?: Yes      Need for Family Participation in Patient Care: Yes (Comment) Care giver  support system in place?: Yes (comment) Current home services: DME (per video interpreter: cane, rolling walker and wheelchair.) Criminal Activity/Legal Involvement Pertinent to Current Situation/Hospitalization: No - Comment as needed  Activities of Daily Living Home Assistive Devices/Equipment: Dentures (specify type) ADL Screening (condition at time of admission) Patient's cognitive ability adequate to safely complete daily activities?: No Is the patient deaf or have difficulty hearing?: No Does the patient have difficulty seeing, even when wearing glasses/contacts?: No Does the patient have difficulty concentrating, remembering, or making decisions?: No Patient able to express need for assistance with ADLs?: Yes Does the patient have difficulty dressing or bathing?: Yes Independently performs ADLs?: No Communication: Independent Dressing (OT): Needs assistance Is this a change from baseline?: Change from baseline, expected to last <3days Grooming: Needs assistance Is this a change from baseline?: Change from baseline, expected to last <3 days Feeding: Independent Bathing: Needs assistance Is this a change from baseline?: Change from baseline, expected to last <3 days Toileting: Needs assistance Is this a change from baseline?: Change from baseline, expected to last <3 days In/Out Bed: Needs assistance Is this a change from baseline?: Change from baseline, expected to last <3 days Walks in Home: Independent Does the patient have difficulty walking or climbing stairs?: Yes Weakness of Legs: Both Weakness of Arms/Hands: None  Permission Sought/Granted Permission sought to share information with : Family Supports, Magazine features editor, Case Estate manager/land agent granted to share information with : Yes, Verbal Permission Granted  Permission granted to share info w AGENCY: [id number]        Emotional Assessment Appearance:: Appears stated age Attitude/Demeanor/Rapport:  Engaged Affect (typically observed): Appropriate Orientation: : Oriented to Self, Oriented to Place, Oriented to  Time, Oriented to Situation Alcohol / Substance Use: Not Applicable Psych Involvement: No (comment)  Admission diagnosis:  Melena [K92.1] GI bleed [K92.2] Patient Active Problem List   Diagnosis Date Noted   Gastric ulcer with hemorrhage 12/24/2022   Melena 12/23/2022   Acute blood loss anemia 12/23/2022   Abdominal pain, epigastric 12/23/2022   Coffee ground emesis 12/23/2022   Gastric ulcer without hemorrhage or perforation 12/23/2022   GI bleed 12/22/2022   Intermittent chest pain 12/22/2022   Atypical atrial flutter (HCC) 10/22/2020   Secondary hypercoagulable state (HCC) 06/02/2020   Diabetes mellitus without complication (HCC) 10/23/2016   Aneurysm of thoracic aorta (HCC) 10/23/2016   Tobacco abuse    Chronic diastolic CHF (congestive heart failure) (HCC)    Other hyperlipidemia    Renal infarct (HCC) 10/12/2016   Constipation 10/12/2016   Gout attack: Probable left great toe 10/12/2016   Smoker 05/08/2011   Hypertension 03/27/2011   Hyperlipidemia 03/27/2011   Atrial fibrillation (HCC) 12/16/2010   CVA (cerebral vascular accident) (HCC) 12/16/2010   PCP:  [name], MD Pharmacy:   [id number] [place] Sp[place], [place] - [address] [facility] [address] [place] [address] Phone: [phone] Fax: [phone]  [facility] [address] [place] [address] Phone: [phone] Fax: [phone]     Social Determinants of Health (SDOH) Social History: SDOH Screenings   Food Insecurity: No Food Insecurity (12/23/2022)  Housing: Low Risk  (12/23/2022)  Transportation Needs: No Transportation Needs (12/23/2022)  Utilities: Not At Risk (12/23/2022)  Tobacco Use: Medium Risk (12/23/2022)   SDOH Interventions:     Readmission Risk Interventions     No data to display

## 2022-12-25 NOTE — Telephone Encounter (Signed)
Pharmacy Patient Advocate Encounter  Insurance verification completed.    The patient is insured through [employer] Medicare Part D   The patient is currently admitted and ran test claims for the following: Eliquis.  Copays and coinsurance results were relayed to Inpatient clinical team.  

## 2022-12-25 NOTE — TOC Benefit Eligibility Note (Signed)
Patient Advocate Encounter  Insurance verification completed.    The patient is currently admitted and upon discharge could be taking Eliquis 5 mg.  The current 30 day co-pay is $0.00.   The patient is insured through [employer] Medicare Part D   This test claim was processed through [facility]- copay amounts may vary at other pharmacies due to pharmacy/plan contracts, or as the patient moves through the different stages of their insurance plan.  Chisom Muntean, CPHT Pharmacy Patient Advocate Specialist [facility] Patient Advocate Team Direct Number: [phone]  Fax: [phone]       

## 2022-12-26 ENCOUNTER — Telehealth: Payer: Self-pay

## 2022-12-26 LAB — CULTURE, BLOOD (ROUTINE X 2)

## 2022-12-26 LAB — SURGICAL PATHOLOGY

## 2022-12-26 NOTE — Transitions of Care (Post Inpatient/ED Visit) (Signed)
   12/26/2022  Name: Linda [name] MRN: [id number] DOB: 1941/04/16  Today's TOC FU Call Status: Today's TOC FU Call Status:: Unsuccessul Call (1st Attempt) Unsuccessful Call (1st Attempt) Date: 12/26/22  Attempted to reach the patient regarding the most recent Inpatient/ED visit.  Follow Up Plan: Additional outreach attempts will be made to reach the patient to complete the Transitions of Care (Post Inpatient/ED visit) call.   Dr Alvis Lemmings is listed as PCP but she has not seen Dr Alvis Lemmings since 2018  Signature Robyne Peers, RN

## 2022-12-27 ENCOUNTER — Telehealth: Payer: Self-pay

## 2022-12-27 LAB — CULTURE, BLOOD (ROUTINE X 2): Special Requests: ADEQUATE

## 2022-12-27 NOTE — Transitions of Care (Post Inpatient/ED Visit) (Signed)
   12/27/2022  Name: Linda [name] MRN: [id number] DOB: 11/12/1940  Today's TOC FU Call Status: Today's TOC FU Call Status:: Unsuccessful Call (2nd Attempt) Unsuccessful Call (1st Attempt) Date: 12/26/22 Unsuccessful Call (2nd Attempt) Date: 12/27/22  Attempted to reach the patient regarding the most recent Inpatient/ED visit.  Follow Up Plan: Additional outreach attempts will be made to reach the patient to complete the Transitions of Care (Post Inpatient/ED visit) call.   Dr Alvis Lemmings is listed as the PCP but has not seen the patient since 2018.   Signature Robyne Peers, RN

## 2022-12-28 ENCOUNTER — Telehealth: Payer: Self-pay

## 2022-12-28 NOTE — Telephone Encounter (Signed)
Amy Hyatt RN with [facility] is calling to speak with Erskine Squibb regarding pt. Amy states that they have accepted the pt for home health but there is some confusion on who the pt PCP is. Please call Amy back [phone].

## 2022-12-28 NOTE — Transitions of Care (Post Inpatient/ED Visit) (Signed)
   12/28/2022  Name: Linda [name] MRN: [id number] DOB: 1941-03-12  Today's TOC FU Call Status: Today's TOC FU Call Status:: Unsuccessful Call (3rd Attempt) Unsuccessful Call (1st Attempt) Date: 12/26/22 Unsuccessful Call (2nd Attempt) Date: 12/27/22 Unsuccessful Call (3rd Attempt) Date: 12/28/22  Attempted to reach the patient regarding the most recent Inpatient/ED visit.  Follow Up Plan: No further outreach attempts will be made at this time. We have been unable to contact the patient.  Dr Alvis Lemmings is listed as the PCP but has not seen the patient since 2018.    Signature Robyne Peers, RN

## 2022-12-28 NOTE — Telephone Encounter (Signed)
I spoke to Amy/ [facility]. She explained that they are trying to reach the patient to initiate home health services but need to have a PCP to sign orders.  She understands that the patient has not seen Dr Alvis Lemmings since 2018.  I told her that I will try to reach the patient again today and will call her if I am able to connect with her and schedule her an appointment with a provider at our clinic

## 2023-02-26 ENCOUNTER — Other Ambulatory Visit (HOSPITAL_COMMUNITY): Payer: Self-pay

## 2023-02-26 ENCOUNTER — Other Ambulatory Visit: Payer: Self-pay | Admitting: Student

## 2023-03-02 ENCOUNTER — Other Ambulatory Visit (HOSPITAL_COMMUNITY): Payer: Self-pay

## 2023-03-29 ENCOUNTER — Other Ambulatory Visit: Payer: Self-pay | Admitting: Student

## 2023-04-18 ENCOUNTER — Other Ambulatory Visit: Payer: Self-pay | Admitting: Physician Assistant

## 2023-04-30 ENCOUNTER — Other Ambulatory Visit: Payer: Self-pay | Admitting: Thoracic Surgery (Cardiothoracic Vascular Surgery)

## 2023-04-30 DIAGNOSIS — I712 Thoracic aortic aneurysm, without rupture, unspecified: Secondary | ICD-10-CM

## 2023-05-04 ENCOUNTER — Ambulatory Visit: Payer: 59 | Admitting: Cardiology

## 2023-05-10 ENCOUNTER — Ambulatory Visit (INDEPENDENT_AMBULATORY_CARE_PROVIDER_SITE_OTHER): Payer: 59 | Admitting: Cardiology

## 2023-05-10 ENCOUNTER — Encounter (HOSPITAL_BASED_OUTPATIENT_CLINIC_OR_DEPARTMENT_OTHER): Payer: Self-pay | Admitting: Cardiology

## 2023-05-10 VITALS — BP 166/82 | HR 76 | Ht 60.0 in | Wt 123.0 lb

## 2023-05-10 DIAGNOSIS — I4819 Other persistent atrial fibrillation: Secondary | ICD-10-CM

## 2023-05-10 DIAGNOSIS — I4891 Unspecified atrial fibrillation: Secondary | ICD-10-CM | POA: Diagnosis not present

## 2023-05-10 NOTE — Progress Notes (Signed)
Cardiology Office Note:  .   Date:  05/10/2023  ID:  Linda [name], DOB 08-Dec-1940, MRN [id number] PCP: [name], MD  [facility] Providers Cardiologist:  [name], MD Electrophysiologist:  Lanier Prude, MD    History of Present Illness: .   Linda [name] is a 82 y.o. female here for follow-up persistent atrial fibrillation, coronary artery disease, prior stroke, dilated ascending aorta here for follow-up.  Hospitalization on 12/25/2022 with bleeding ulcer, initially hypotensive with coffee-ground emesis.  EGD showed 1 nonbleeding cratered ulcer.  She received 1 unit of blood.  Since she had been back-and-forth at times with sinus bradycardia, amiodarone had been continued.  Now however she is in atrial fibrillation.  She was in atrial fibrillation on her prior EKG in May.  Discussed with the use of AI scribe software History of Present Illness   The patient, with a history of atrial fibrillation, stroke, and ascending aortic dilatation, presents with intermittent chest pain and shortness of breath. The discomfort is not constant and only lasts for a few seconds. The patient denies any fainting episodes but does report feeling her heart racing at times. The patient's atrial fibrillation was diagnosed in the setting of a stroke in 2012. The patient has been noncompliant with Xarelto and has been converted back to sinus bradycardia with low dose amiodarone and carbatrol. The patient also reports discomfort and enlargement in the abdominal area.        Studies Reviewed: Marland Kitchen        RADIOLOGY CT scan: Ascending aorta 4.1 cm, mildly dilated (05/31/2022)  DIAGNOSTIC Echocardiogram: EF 60-65%, mild LVH, mild aortic insufficiency (11/2010) Nuclear stress test: Low risk, small defect in APEC location, partially reversible, EF 60% (12/30/2020) Echocardiogram: EF 65%, moderate aortic regurgitation, ascending aorta 39 mm (08/02/2020) Heart monitor: 21% atrial fibrillation burden,  average atrial fibrillation heart rate 134 (2021) EKG: Heart rate 75 bpm, atrial fibrillation (01/12/2023) EKG: Atrial fibrillation (05/10/2023)  Risk Assessment/Calculations:    CHA2DS2-VASc Score = 8   This indicates a 10.8% annual risk of stroke. The patient's score is based upon: CHF History: 0 HTN History: 1 Diabetes History: 1 Stroke History: 2 Vascular Disease History: 1 Age Score: 2 Gender Score: 1           Physical Exam:   VS:  BP (!) 166/82   Pulse 76   Ht 5' (1.524 m)   Wt 123 lb (55.8 kg)   SpO2 97%   BMI 24.02 kg/m    Wt Readings from Last 3 Encounters:  05/10/23 123 lb (55.8 kg)  12/23/22 117 lb 8.1 oz (53.3 kg)  10/20/22 120 lb 3.2 oz (54.5 kg)    GEN: Well nourished, well developed in no acute distress NECK: No JVD; No carotid bruits CARDIAC: Genice [place], no murmurs, rubs, gallops RESPIRATORY:  Clear to auscultation without rales, wheezing or rhonchi  ABDOMEN: Soft, non-tender, non-distended EXTREMITIES:  No edema; No deformity   ASSESSMENT AND PLAN: .    Assessment and Plan    Atrial Fibrillation, seems longstanding persistent Recurrent despite DC cardioversion and amiodarone therapy. Currently in atrial fibrillation with heart rate of 75 bpm. CHADS-VASc score 8. Noncompliant with Xarelto in the past, states she is taking medication now 15 mg. -Discontinue amiodarone since she has demonstrated atrial fibrillation at 2 points in time over the last 4 months.  She is also demonstrated bradycardia at times as well. -Continue with carvedilol 12.5 mg twice a day -Schedule follow-up in 6  months with APP or AFib clinic.  Ascending Aortic Dilatation CT scan on 05/31/2022 showed 4.1 cm ascending aorta, mildly dilated. -Monitoring without intervention at this time.  Abdominal Discomfort Patient reports increasing abdominal girth and discomfort.  Gastrointestinal upset.  No evidence of melena.  Prior gastric ulcer noted.  Protonix was utilized. -Recommend  evaluation by primary care provider.              Signed, [name], MD

## 2023-05-10 NOTE — Patient Instructions (Signed)
Medication Instructions:  Please discontinue your Amiodarone. Continue all other medications as listed.  *If you need a refill on your cardiac medications before your next appointment, please call your pharmacy*  Follow-Up: At [facility], you and your health needs are our priority.  As part of our continuing mission to provide you with exceptional heart care, we have created designated Provider Care Teams.  These Care Teams include your primary Cardiologist (physician) and Advanced Practice Providers (APPs -  Physician Assistants and Nurse Practitioners) who all work together to provide you with the care you need, when you need it.  We recommend signing up for the patient portal called "MyChart".  Sign up information is provided on this After Visit Summary.  MyChart is used to connect with patients for Virtual Visits (Telemedicine).  Patients are able to view lab/test results, encounter notes, upcoming appointments, etc.  Non-urgent messages can be sent to your provider as well.   To learn more about what you can do with MyChart, go to [email or web address].    Your next appointment:   6 month(s)  Provider:   Otilio Saber or Jari Favre, PA-C, Robin Searing, NP, Eligha Bridegroom, NP, Tereso Newcomer, PA-C, or Perlie Gold, PA-C

## 2023-05-11 ENCOUNTER — Other Ambulatory Visit: Payer: Self-pay | Admitting: Student

## 2023-06-15 ENCOUNTER — Other Ambulatory Visit: Payer: 59

## 2023-06-19 ENCOUNTER — Ambulatory Visit: Payer: 59 | Admitting: Thoracic Surgery (Cardiothoracic Vascular Surgery)

## 2023-07-20 ENCOUNTER — Other Ambulatory Visit: Payer: Self-pay | Admitting: Cardiology

## 2023-07-23 NOTE — Telephone Encounter (Signed)
Prescription refill request for Xarelto received.  Indication:afib   Last office visit:9/24 Weight:55.8  kg Age:82 Scr:0.79  5/24 CrCl:48.36  ml/min  Prescription refilled

## 2023-09-11 ENCOUNTER — Emergency Department (HOSPITAL_COMMUNITY): Payer: 59

## 2023-09-11 ENCOUNTER — Emergency Department (HOSPITAL_COMMUNITY)
Admission: EM | Admit: 2023-09-11 | Discharge: 2023-09-11 | Disposition: A | Discharge status: Home / Self Care | Payer: 59 | Attending: Emergency Medicine | Admitting: Emergency Medicine

## 2023-09-11 ENCOUNTER — Other Ambulatory Visit: Payer: Self-pay

## 2023-09-11 DIAGNOSIS — I10 Essential (primary) hypertension: Secondary | ICD-10-CM | POA: Insufficient documentation

## 2023-09-11 DIAGNOSIS — Z8673 Personal history of transient ischemic attack (TIA), and cerebral infarction without residual deficits: Secondary | ICD-10-CM | POA: Diagnosis not present

## 2023-09-11 DIAGNOSIS — R079 Chest pain, unspecified: Secondary | ICD-10-CM | POA: Diagnosis present

## 2023-09-11 DIAGNOSIS — R519 Headache, unspecified: Secondary | ICD-10-CM | POA: Diagnosis not present

## 2023-09-11 DIAGNOSIS — E119 Type 2 diabetes mellitus without complications: Secondary | ICD-10-CM | POA: Insufficient documentation

## 2023-09-11 DIAGNOSIS — Z20822 Contact with and (suspected) exposure to covid-19: Secondary | ICD-10-CM | POA: Insufficient documentation

## 2023-09-11 DIAGNOSIS — N3000 Acute cystitis without hematuria: Secondary | ICD-10-CM | POA: Insufficient documentation

## 2023-09-11 DIAGNOSIS — R0602 Shortness of breath: Secondary | ICD-10-CM | POA: Diagnosis present

## 2023-09-11 LAB — URINALYSIS, ROUTINE W REFLEX MICROSCOPIC
Bilirubin Urine: NEGATIVE
Glucose, UA: 50 mg/dL — AB
Hgb urine dipstick: NEGATIVE
Ketones, ur: NEGATIVE mg/dL
Nitrite: NEGATIVE
Protein, ur: NEGATIVE mg/dL
Specific Gravity, Urine: 1.009 (ref 1.005–1.030)
pH: 7 (ref 5.0–8.0)

## 2023-09-11 LAB — RESP PANEL BY RT-PCR (RSV, FLU A&B, COVID)  RVPGX2
Influenza A by PCR: NEGATIVE
Influenza B by PCR: NEGATIVE
Resp Syncytial Virus by PCR: NEGATIVE
SARS Coronavirus 2 by RT PCR: NEGATIVE

## 2023-09-11 LAB — CBC
HCT: 43.8 % (ref 36.0–46.0)
Hemoglobin: 14.5 g/dL (ref 12.0–15.0)
MCH: 28.1 pg (ref 26.0–34.0)
MCHC: 33.1 g/dL (ref 30.0–36.0)
MCV: 84.9 fL (ref 80.0–100.0)
Platelets: 282 10*3/uL (ref 150–400)
RBC: 5.16 MIL/uL — ABNORMAL HIGH (ref 3.87–5.11)
RDW: 13.2 % (ref 11.5–15.5)
WBC: 9 10*3/uL (ref 4.0–10.5)
nRBC: 0 % (ref 0.0–0.2)

## 2023-09-11 LAB — CBG MONITORING, ED: Glucose-Capillary: 210 mg/dL — ABNORMAL HIGH (ref 70–99)

## 2023-09-11 LAB — LIPASE, BLOOD: Lipase: 47 U/L (ref 11–51)

## 2023-09-11 LAB — COMPREHENSIVE METABOLIC PANEL
ALT: 23 U/L (ref 0–44)
AST: 24 U/L (ref 15–41)
Albumin: 3.6 g/dL (ref 3.5–5.0)
Alkaline Phosphatase: 73 U/L (ref 38–126)
Anion gap: 11 (ref 5–15)
BUN: 10 mg/dL (ref 8–23)
CO2: 22 mmol/L (ref 22–32)
Calcium: 9.2 mg/dL (ref 8.9–10.3)
Chloride: 105 mmol/L (ref 98–111)
Creatinine, Ser: 0.78 mg/dL (ref 0.44–1.00)
GFR, Estimated: >60 mL/min (ref >60)
Glucose, Bld: 206 mg/dL — ABNORMAL HIGH (ref 70–99)
Potassium: 3.7 mmol/L (ref 3.5–5.1)
Sodium: 138 mmol/L (ref 135–145)
Total Bilirubin: 0.9 mg/dL (ref 0.0–1.2)
Total Protein: 6.8 g/dL (ref 6.5–8.1)

## 2023-09-11 LAB — TROPONIN I (HIGH SENSITIVITY)
Troponin I (High Sensitivity): 6 ng/L (ref <18)
Troponin I (High Sensitivity): 6 ng/L (ref <18)

## 2023-09-11 MED ORDER — ACETAMINOPHEN 500 MG PO TABS
1000.0000 mg | ORAL_TABLET | Freq: Once | ORAL | Status: AC
Start: 2023-09-11 — End: 2023-09-11
  Administered 2023-09-11: 1000 mg via ORAL
  Filled 2023-09-11: qty 2

## 2023-09-11 MED ORDER — CEPHALEXIN 500 MG PO CAPS: 500.0000 mg | ORAL_CAPSULE | Freq: Two times a day (BID) | ORAL | 0 refills | Status: AC

## 2023-09-11 NOTE — ED Provider Notes (Signed)
[place] EMERGENCY DEPARTMENT AT [facility] Provider Note   CSN: [id number] Arrival date & time: 09/11/23  0044     History Chief Complaint  Patient presents with   Chest Pain   Headache   Shortness of Breath    Linda [name] is a 83 y.o. female with h/o stroke, afib on xarelto, DM, HLD, HTN, constipation presents to the ER for evaluation of chest pain, SOB, and headache since 2000 today. EMS gave nitroglycerin and ASA and her symptoms improved per patient. Reports slightly left central chest pain. No radiation. Describes it as "sharp, dull". Not worse with exertion. She is unsure what her medications are, specifically if she is taking her blood thinner, however she reports that "takes all the medications my doctors give me". Denies any fever, cough, FLS, abdominal pain, nausea, vomiting, diarrhea, melena, hematochezia, trouble walking, trouble talking, vision changes. She is unable to describe her SOB or give any other information on this. Even with interpreter service, she is difficult historian. Called [name] listed on the patient's contact who is an interpreter, but doesn't know the patient personally. She reports that the family members work a lot and don't speak English so she was listed.   Interpreter service used during this encounter.   Chest Pain Associated symptoms: headache and shortness of breath   Associated symptoms: no abdominal pain, no cough, no dizziness, no fever, no nausea, no palpitations, no vomiting and no weakness   Headache Associated symptoms: no abdominal pain, no congestion, no cough, no diarrhea, no dizziness, no fever, no nausea, no neck pain, no neck stiffness, no photophobia, no vomiting and no weakness   Shortness of Breath Associated symptoms: chest pain and headaches   Associated symptoms: no abdominal pain, no cough, no fever, no neck pain and no vomiting        Home Medications Prior to Admission medications   Medication Sig Start  Date End Date Taking? Authorizing Provider  cephALEXin (KEFLEX) 500 MG capsule Take 1 capsule (500 mg total) by mouth 2 (two) times daily. 09/11/23  Yes Achille Rich, PA-C  amLODipine (NORVASC) 5 MG tablet Take 5 mg by mouth daily. 01/11/23   [provider]  atorvastatin (LIPITOR) 80 MG tablet Take 1 tablet (80 mg total) by mouth daily. 06/13/22   Lanier Prude, MD  carvedilol (COREG) 12.5 MG tablet TAKE 1 TABLET(12.5 MG) BY MOUTH TWICE DAILY 04/18/23   Lanier Prude, MD  clotrimazole-betamethasone (LOTRISONE) cream Apply topically 2 (two) times daily. 02/06/23   [provider]  hydrALAZINE (APRESOLINE) 50 MG tablet Take 50 mg by mouth 3 (three) times daily. 01/11/23   [provider]  isosorbide mononitrate (IMDUR) 60 MG 24 hr tablet Take 1 tablet (60 mg total) by mouth daily. 06/13/22   Lanier Prude, MD  pantoprazole (PROTONIX) 40 MG tablet Take 1 tablet (40 mg total) by mouth 2 (two) times daily. 12/25/22   Tiffany Kocher, DO  Rivaroxaban (XARELTO) 15 MG TABS tablet TAKE 1 TABLET(15 MG) BY MOUTH DAILY WITH SUPPER 07/23/23   Lanier Prude, MD  traMADol (ULTRAM) 50 MG tablet Take 50 mg by mouth every 12 (twelve) hours as needed for severe pain. Patient not taking: Reported on 05/10/2023 08/07/22   [provider]  Vitamin D, Ergocalciferol, (DRISDOL) 1.25 MG (50000 UNIT) CAPS capsule Take 50,000 Units by mouth once a week. 03/09/23   [provider]      Allergies    Patient has no known  allergies.    Review of Systems   Review of Systems  Constitutional:  Negative for chills and fever.  HENT:  Negative for congestion and rhinorrhea.   Eyes:  Negative for photophobia and visual disturbance.  Respiratory:  Positive for shortness of breath. Negative for cough.   Cardiovascular:  Positive for chest pain. Negative for palpitations and leg swelling.  Gastrointestinal:  Positive for constipation. Negative for abdominal pain, diarrhea,  nausea and vomiting.  Genitourinary:  Positive for frequency. Negative for dysuria and hematuria.  Musculoskeletal:  Negative for neck pain and neck stiffness.  Neurological:  Positive for headaches. Negative for dizziness, weakness and light-headedness.    Physical Exam Updated Vital Signs BP 136/72   Pulse 72   Temp 97.6 F (36.4 C) (Oral)   Resp 15   Ht 5\' 2"  (1.575 m)   Wt 54.4 kg   SpO2 99%   BMI 21.95 kg/m  Physical Exam Vitals and nursing note reviewed.  Constitutional:      General: She is not in acute distress.    Appearance: She is not ill-appearing or toxic-appearing.  HENT:     Head: Normocephalic and atraumatic.  Eyes:     Extraocular Movements: Extraocular movements intact.     Pupils: Pupils are equal, round, and reactive to light.  Cardiovascular:     Rate and Rhythm: Normal rate. Rhythm irregular.     Pulses:          Radial pulses are 2+ on the right side and 2+ on the left side.       Dorsalis pedis pulses are 2+ on the right side and 2+ on the left side.       Posterior tibial pulses are 2+ on the right side and 2+ on the left side.  Pulmonary:     Effort: Pulmonary effort is normal. No tachypnea or respiratory distress.     Breath sounds: No decreased breath sounds.  Chest:     Chest wall: Tenderness present. No mass.       Comments: Tenderness to palpation to the marked area. No overlying skin changes or mass. No induration, fluctuance, erythema, or increase in warmth noted.  Abdominal:     Palpations: Abdomen is soft.     Tenderness: There is no abdominal tenderness. There is no guarding or rebound.  Musculoskeletal:     Cervical back: Normal range of motion. No rigidity.     Right lower leg: No tenderness. No edema.     Left lower leg: No tenderness. No edema.  Lymphadenopathy:     Cervical: No cervical adenopathy.  Skin:    General: Skin is warm and dry.  Neurological:     Mental Status: She is alert.     ED Results / Procedures /  Treatments   Labs (all labs ordered are listed, but only abnormal results are displayed) Labs Reviewed  CBC - Abnormal; Notable for the following components:      Result Value   RBC 5.16 (*)    All other components within normal limits  COMPREHENSIVE METABOLIC PANEL - Abnormal; Notable for the following components:   Glucose, Bld 206 (*)    All other components within normal limits  URINALYSIS, ROUTINE W REFLEX MICROSCOPIC - Abnormal; Notable for the following components:   Color, Urine STRAW (*)    Glucose, UA 50 (*)    Leukocytes,Ua TRACE (*)    Bacteria, UA RARE (*)    All other components within normal limits  CBG MONITORING, ED - Abnormal; Notable for the following components:   Glucose-Capillary 210 (*)    All other components within normal limits  RESP PANEL BY RT-PCR (RSV, FLU A&B, COVID)  RVPGX2  URINE CULTURE  LIPASE, BLOOD  TROPONIN I (HIGH SENSITIVITY)  TROPONIN I (HIGH SENSITIVITY)    EKG EKG Interpretation Date/Time:  Tuesday September 11 2023 01:04:44 EST Ventricular Rate:  59 PR Interval:    QRS Duration:  88 QT Interval:  473 QTC Calculation: 469 R Axis:   50  Text Interpretation: Atrial fibrillation Low voltage, precordial leads Borderline T abnormalities, anterior leads Confirmed by Zadie Rhine ([id number]) on 09/11/2023 1:06:35 AM  Radiology CT Head Wo Contrast Result Date: 09/11/2023 CLINICAL DATA:  Chest pain and new onset headache. EXAM: CT HEAD WITHOUT CONTRAST TECHNIQUE: Contiguous axial images were obtained from the base of the skull through the vertex without intravenous contrast. RADIATION DOSE REDUCTION: This exam was performed according to the departmental dose-optimization program which includes automated exposure control, adjustment of the mA and/or kV according to patient size and/or use of iterative reconstruction technique. COMPARISON:  Head CT 12/08/2010, MR brain 12/09/2010. FINDINGS: Brain: There are chronic infarcts, again noted in the  medial right occipital lobe with a chronic lacunar infarct which was previously acute in the left hemipons. Additional chronic appearing infarct new from the prior study the inferior right cerebellar hemisphere. There is mild global atrophy, mild small-vessel disease of the cerebral white matter and atrophic ventriculomegaly all progressive from the previous exam, chronic lacunar infarct in the body of left caudate nucleus. No CT asymmetry is seen concerning for a cortical based acute infarct, hemorrhage, mass or mass effect. There is no midline shift.  The basal cisterns are clear. Vascular: There are moderate to heavy calcifications in both siphons, both distal vertebral arteries. No hyperdense central vessel is seen. Skull: Negative for fractures or focal lesions. Sinuses/Orbits: There is mild membrane thickening in the ethmoid and maxillary sinuses without fluid levels. The frontal and sphenoid sinuses and right mastoid air cells are clear. There is patchy fluid in the lower left mastoid air cells. The nasal septum is midline. Negative orbits apart from interval lens replacements. Other: None. IMPRESSION: 1. No acute intracranial CT findings. 2. Atrophy, small-vessel disease and chronic infarcts. 3. Sinus membrane disease. 4. Small left mastoid effusion. 5. Carotid atherosclerosis. Electronically Signed   By: Almira Bar M.D.   On: 09/11/2023 02:49   DG Chest Portable 1 View Result Date: 09/11/2023 CLINICAL DATA:  Chest pain, abdominal pain, shortness of breath EXAM: PORTABLE CHEST 1 VIEW COMPARISON:  06/19/2022 FINDINGS: Mild cardiomegaly. No confluent opacities, effusions or edema. No acute bony abnormality. Aortic atherosclerosis. IMPRESSION: Cardiomegaly.  No active disease. Electronically Signed   By: Charlett Nose M.D.   On: 09/11/2023 01:35    Procedures Procedures   Medications Ordered in ED Medications  acetaminophen (TYLENOL) tablet 1,000 mg (1,000 mg Oral Given 09/11/23 0455)    ED  Course/ Medical Decision Making/ A&P   Medical Decision Making Amount and/or Complexity of Data Reviewed Labs: ordered. Radiology: ordered.  Risk OTC drugs. Prescription drug management.   83 y.o. female presents to the ER for evaluation of chest pain, SOB, and HA. Differential diagnosis includes but is not limited to ACS, pericarditis, myocarditis, aortic dissection, PE, pneumothorax, esophageal spasm or rupture, chronic angina, pneumonia, bronchitis, GERD, reflux/PUD, biliary disease, pancreatitis, costochondritis, anxiety, Stroke, increased ICP, meningitis, CVA, intracranial tumor, venous sinus thrombosis, migraine, cluster headache, hypertension, drug related, head  injury, tension headache, sinusitis, dental abscess, otitis media, TMJ, temporal arteritis.. Vital signs unremarkable. Physical exam as noted above.   Nursing note mentions patient has abdominal pain however I have asked the patient twice and interpreter service and she denies any abdominal pain, nausea, vomiting, diarrhea.  Reports chronic constipation but this has been present for 30+ years per patient.  Her abdomen is soft and nontender.  She does report some urinary frequency.  I do not think any CT imaging is needed at this time.  For her chest pain, it is tender upon palpation.  It is just to the right of the lower sternum.  No overlying skin changes or step-offs or deformity noted to the area.  Her lungs are clear to auscultation bilaterally.  She reports her chest pain and headache improved with the nitro and aspirin given to her via EMS.  Will continue with labs, EKG, chest x-ray, head CT.  I independently reviewed and interpreted the patient's labs.  COVID, flu, RSV negative.  CBC without leukocytosis or anemia.  CMP shows mildly elevated Leukos at 206 otherwise no electrolyte or LFT normality.  Lipase within normal limits.  Troponins 6 with repeat at 6.  Urinalysis shows some glucose in the urine.  There is trace leukocytes,  11-20 white blood cells with rare bacteria.  Questionable for UTI.  Urine culture added.  CT of the head shows 1. No acute intracranial CT findings. 2. Atrophy, small-vessel disease and chronic infarcts. 3. Sinus membrane disease. 4. Small left mastoid effusion. 5. Carotid atherosclerosis. Per radiologist's interpretation.    CXR shows Cardiomegaly.  No active disease. Per radiologist's interpretation.    EKG reviewed and interpreted by my attending and read as Atrial fibrillation Low voltage, precordial leads Borderline T abnormalities, anterior leads..  I have ordered the patient some Tylenol.  On reevaluation, patient is still resting.  Upon awaking, she reports that her headache is almost resolved and that her chest pain is improved some to.  Temples are non tender, no visual changes, doubt GCA. She has no nuchal rigidity or neck pain. No fevers or confusion. Doubt meningitis. The chest pain is worse with movement and with palpation.  There is not any exertional component to this.  I think this is likely MSK. Overall, patient's presentation is improved.  I doubt any ACS given the flat troponins and reassuring EKG.  I have very low suspicion for any PE given the patient's chest pain is reproducible.  She is satting well on room air without any increased work of breathing.  Speaking in full sentences without issue or difficulty.  Chest x-ray does not show any signs of pneumonia.  She is not experiencing any cough or cold symptoms.  She has no nuchal rigidity or tenderness to the temporal areas.  She has no tenderness to the overlying  mastoid areas.  TMs appear clear. Urine consistent with UTI, will prescribe antibiotics.   I contacted the patient's grandson who reports that his parents are home let her in the house. Taxi cab voucher given.  We discussed the results of the labs/imaging. The plan is take antibiotics as prescribed, follow-up with PCP. We discussed strict return precautions and red flag  symptoms. The patient verbalized their understanding and agrees to the plan. The patient is stable and being discharged home in good condition.  Interpreter service used during this encounter.  Portions of this report may have been transcribed using voice recognition software. Every effort was made to ensure accuracy; however,  inadvertent computerized transcription errors may be present.   I discussed this case with my attending physician who cosigned this note including patient's presenting symptoms, physical exam, and planned diagnostics and interventions. Attending physician stated agreement with plan or made changes to plan which were implemented.   Attending physician assessed patient at bedside.  Final Clinical Impression(s) / ED Diagnoses Final diagnoses:  Acute cystitis without hematuria  Nonspecific chest pain  Generalized headache    Rx / DC Orders ED Discharge Orders          Ordered    cephALEXin (KEFLEX) 500 MG capsule  2 times daily        09/11/23 0657              Achille Rich, PA-C 09/11/23 2203    Zadie Rhine, MD 09/12/23 [phone]

## 2023-09-11 NOTE — Discharge Instructions (Addendum)
You were seen in the ER today. Your workup showed a urinary tract infection. This will be treated with an antibiotic. Please take daily. Make sure to follow up with your primary care doctor in the next few days for re-evaluation. If you have any concerns, new or worsening symptoms, please return to the ER.   -----------------------------------------------  B?n ? ???c nhn th?y trong phng c?p c?u ngy hm nay. K?t qu? xt nghi?m c?a b?n cho th?y b?n b? nhi?m trng ???ng ti?t ni?u. ?i?u ny s? ???c ?i?u tr? b?ng thu?c khng sinh. Hy dng hng ngy. ??m b?o lin h? v?i bc s? ch?m [place] chnh c?a b?n trong vi ngy t?i ?? ?nh gi l?i. N?u b?n c b?t k? m?i lo ng?i no, cc tri?u ch?ng m?i ho?c tr?m tr?ng h?n, vui lng quay l?i phng c?p c?u.  Hy lin l?c v?i chuyn gia ch?m  s?c kh?e n?u: ?au ng?c khng h?t. Qu v? c?m th?y tr?m c?m. Qu v? b? s?t. Qu v? th?y c thay ??i v? cc tri?u ch?ng c?a mnh ho?c c cc tri?u ch?ng m?i. Yu c?u tr? gip ngay l?p t?c n?u: ?au ng?c tr?m tr?ng h?n. Qu v? b? ho tr? nn tr?m tr?ng h?n ho?c ho ra mu. Qu v? b? ?au r?t nhi?u ? b?ng. Qu v? b? ng?t. Qu v? ??t ng?t c?m th?y kh ch?u ? ng?c m khng r nguyn nhn. Qu v? ??t ng?t c?m th?y kh ch?u ? cnh tay, l?ng, c? ho?c hm m khng r nguyn nhn. Qu v? b? kh th? b?t k? lc no. Qu v? ??t ng?t ?? m? hi ho?c da qu v? tr?? nn ?m ??t. Qu v? c?m th?y bu?n nn ho?c qu v? nn. Qu v? ??t ng?t c?m th?y chng m?t ho?c chong vng. Qu v? b? y?u r?t nhi?u, ho?c y?u hay m?t m?i khng r nguyn nhn. Tim qu v? b?t ??u ??p nhanh ho?c c?m th?y nh? tim ?ang b? nh?p. Nh?ng tri?u ch?ng ny c th? l bi?u hi?n c?a m?t v?n ?? nghim tr?ng c?n c?p c?u. Khng ch? xem tri?u ch?ng c h?t khng. Hy ?i khm ngay l?p t?c. G?i cho d?ch v? c?p c?u t?i ??a ph??ng (911 ? Hoa K?). Khng t? li xe ??n b?nh vi?n.

## 2023-09-11 NOTE — ED Triage Notes (Signed)
Patient brought in by EMS from with chest pain center chest, abdominal pain, headache. Patient stated her symptoms started earlier today. EMS stated patient was in afib. Vital signs stable. Patient ambulatory.

## 2023-09-12 LAB — URINE CULTURE

## 2024-01-30 ENCOUNTER — Other Ambulatory Visit: Payer: Self-pay | Admitting: Cardiology

## 2024-01-30 DIAGNOSIS — I4891 Unspecified atrial fibrillation: Secondary | ICD-10-CM

## 2024-01-30 NOTE — Telephone Encounter (Signed)
 Prescription refill request for Xarelto  received.  Indication: Afib  Last office visit: 05/10/23 Linda [name])  Weight: 54.4kg Age: 83 Scr: 0.78 (09/11/23)  CrCl: 47.56ml/min  Appropriate dose. Refill sent.

## 2024-03-12 ENCOUNTER — Encounter: Payer: Self-pay | Admitting: Podiatry

## 2024-03-12 ENCOUNTER — Ambulatory Visit (INDEPENDENT_AMBULATORY_CARE_PROVIDER_SITE_OTHER): Admitting: Podiatry

## 2024-03-12 DIAGNOSIS — E1142 Type 2 diabetes mellitus with diabetic polyneuropathy: Secondary | ICD-10-CM

## 2024-03-12 DIAGNOSIS — M79674 Pain in right toe(s): Secondary | ICD-10-CM | POA: Diagnosis not present

## 2024-03-12 DIAGNOSIS — B353 Tinea pedis: Secondary | ICD-10-CM | POA: Diagnosis not present

## 2024-03-12 DIAGNOSIS — M79675 Pain in left toe(s): Secondary | ICD-10-CM

## 2024-03-12 DIAGNOSIS — B351 Tinea unguium: Secondary | ICD-10-CM | POA: Diagnosis not present

## 2024-03-12 MED ORDER — KETOCONAZOLE 2 % EX CREA: 1.0000 | TOPICAL_CREAM | Freq: Every day | CUTANEOUS | 2 refills | Status: AC

## 2024-03-12 NOTE — Progress Notes (Signed)
  Subjective:  Patient ID: Linda [name], female    DOB: Jun 14, 1941,   MRN: [id number]  Chief Complaint  Patient presents with   Diabetes    I am here about the peeling on my feet ? Dr for Diabetes ? A1c      83 y.o. female presents for concern of peeling on her feet. Also concern of thickened elongated and painful nails that are difficult to trim. Requesting to have them trimmed today. Relates burning and tingling in their feet. Patient is diabetic and last A1c was  Lab Results  Component Value Date   HGBA1C 5.9 03/07/2017   .   PCP:  Newlin, Enobong, MD    . Denies any other pedal complaints. Denies n/v/f/c.   Past Medical History:  Diagnosis Date   Atrial fibrillation (HCC)    dx in setting of stroke 4/12;  echo with EF 60-65%, mild LVH, mild AI, grade 1 diast dysfxn   Coronary artery disease    Diabetes mellitus without complication (HCC)    History of CVA (cerebrovascular accident)    New diagnosis December 08, 2010   Hx of cardiovascular stress test    a. Myoview  8/12:  EF 69%, no ischemia.    Hyperlipidemia    Hypertension    Impaired glucose tolerance 05/08/2011   Stroke (HCC) 2012   no deficits   Tobacco abuse    Ongoing (3-6 cigarettes per day, 25 pack year history)    Objective:  Physical Exam: Vascular: DP/PT pulses 2/4 bilateral. CFT <3 seconds. Absent hair growth on digits. Edema noted to bilateral lower extremities. Xerosis noted bilaterally.  Skin. No lacerations or abrasions bilateral feet. Nails 1-5 bilateral  are thickened discolored and elongated with subungual debris.  Musculoskeletal: MMT 5/5 bilateral lower extremities in DF, PF, Inversion and Eversion. Deceased ROM in DF of ankle joint.  Neurological: Sensation intact to light touch. Protective sensation diminished bilateral.     Assessment:   1. Pain due to onychomycosis of toenails of both feet   2. Type 2 diabetes mellitus with peripheral neuropathy (HCC)   3. Tinea pedis of both feet       Plan:  Patient was evaluated and treated and all questions answered. -Discussed and educated patient on diabetic foot care, especially with  regards to the vascular, neurological and musculoskeletal systems.  -Stressed the importance of good glycemic control and the detriment of not  controlling glucose levels in relation to the foot. -Discussed supportive shoes at all times and checking feet regularly.  -Mechanically debrided all nails 1-5 bilateral using sterile nail nipper and filed with dremel without incident  -Answered all patient questions -Patient to return  in 3 months for at risk foot care -Patient advised to call the office if any problems or questions arise in the meantime.   Asberry Failing, DPM

## 2024-05-22 ENCOUNTER — Other Ambulatory Visit: Payer: Self-pay | Admitting: Cardiology

## 2024-05-22 MED ORDER — CARVEDILOL 12.5 MG PO TABS: 12.5000 mg | ORAL_TABLET | Freq: Two times a day (BID) | ORAL | 0 refills | Status: DC

## 2024-05-23 ENCOUNTER — Other Ambulatory Visit: Payer: Self-pay | Admitting: Cardiology

## 2024-06-11 ENCOUNTER — Ambulatory Visit: Admitting: Podiatry

## 2024-06-22 ENCOUNTER — Other Ambulatory Visit: Payer: Self-pay | Admitting: Cardiology

## 2024-06-23 ENCOUNTER — Other Ambulatory Visit: Payer: Self-pay | Admitting: Cardiology

## 2024-08-15 ENCOUNTER — Other Ambulatory Visit: Payer: Self-pay | Admitting: Cardiology

## 2024-08-15 DIAGNOSIS — I4891 Unspecified atrial fibrillation: Secondary | ICD-10-CM

## 2024-08-15 NOTE — Telephone Encounter (Signed)
 Prescription refill request for Xarelto  received.  Indication:AFIB Last office visit:needs appt Weight: Age: Scr: CrCl: Prescription refilled

## 2024-08-23 ENCOUNTER — Other Ambulatory Visit: Payer: Self-pay | Admitting: Student

## 2024-09-23 ENCOUNTER — Other Ambulatory Visit: Payer: Self-pay | Admitting: Cardiology

## 2024-09-23 DIAGNOSIS — I4891 Unspecified atrial fibrillation: Secondary | ICD-10-CM

## 2024-09-23 NOTE — Telephone Encounter (Signed)
 Pt last saw Dr Jeffrie 05/10/23,pt is overdue for 6 month follow-up, recall in Epic. Msg sent to schedulers in Epic.  Will await appt to refill rx.
